# Patient Record
Sex: Female | Born: 1935 | Race: White | Hispanic: No | Marital: Married | Smoking: Never smoker
Health system: Southern US, Community
[De-identification: ages and names within clinical notes are randomized; demographics above are authoritative.]

## PROBLEM LIST (undated history)

## (undated) DIAGNOSIS — I4891 Unspecified atrial fibrillation: Secondary | ICD-10-CM

## (undated) DIAGNOSIS — I639 Cerebral infarction, unspecified: Secondary | ICD-10-CM

## (undated) DIAGNOSIS — F329 Major depressive disorder, single episode, unspecified: Secondary | ICD-10-CM

## (undated) DIAGNOSIS — R1310 Dysphagia, unspecified: Secondary | ICD-10-CM

## (undated) DIAGNOSIS — M353 Polymyalgia rheumatica: Secondary | ICD-10-CM

## (undated) DIAGNOSIS — F32A Depression, unspecified: Secondary | ICD-10-CM

## (undated) DIAGNOSIS — E785 Hyperlipidemia, unspecified: Secondary | ICD-10-CM

## (undated) DIAGNOSIS — I2510 Atherosclerotic heart disease of native coronary artery without angina pectoris: Secondary | ICD-10-CM

## (undated) DIAGNOSIS — I10 Essential (primary) hypertension: Secondary | ICD-10-CM

## (undated) HISTORY — PX: TOTAL ABDOMINAL HYSTERECTOMY: SHX209

## (undated) HISTORY — DX: Dysphagia, unspecified: R13.10

## (undated) HISTORY — DX: Essential (primary) hypertension: I10

## (undated) HISTORY — DX: Unspecified atrial fibrillation: I48.91

## (undated) HISTORY — DX: Hyperlipidemia, unspecified: E78.5

## (undated) HISTORY — PX: TOE SURGERY: SHX1073

## (undated) HISTORY — DX: Atherosclerotic heart disease of native coronary artery without angina pectoris: I25.10

## (undated) HISTORY — DX: Polymyalgia rheumatica: M35.3

## (undated) HISTORY — PX: OTHER SURGICAL HISTORY: SHX169

## (undated) HISTORY — PX: APPENDECTOMY: SHX54

## (undated) HISTORY — DX: Cerebral infarction, unspecified: I63.9

---

## 2003-06-03 ENCOUNTER — Inpatient Hospital Stay (HOSPITAL_COMMUNITY): Admission: AD | Admit: 2003-06-03 | Discharge: 2003-06-07 | Payer: Self-pay | Admitting: Cardiology

## 2004-06-19 ENCOUNTER — Ambulatory Visit: Payer: Self-pay | Admitting: Cardiology

## 2004-06-26 ENCOUNTER — Ambulatory Visit: Payer: Self-pay | Admitting: Cardiology

## 2004-06-27 ENCOUNTER — Ambulatory Visit: Payer: Self-pay | Admitting: Cardiology

## 2004-07-16 ENCOUNTER — Ambulatory Visit: Payer: Self-pay | Admitting: Cardiology

## 2004-07-24 ENCOUNTER — Ambulatory Visit: Payer: Self-pay | Admitting: Cardiology

## 2004-08-21 ENCOUNTER — Ambulatory Visit: Payer: Self-pay | Admitting: Cardiology

## 2004-09-18 ENCOUNTER — Ambulatory Visit: Payer: Self-pay | Admitting: Cardiology

## 2004-10-18 ENCOUNTER — Ambulatory Visit: Payer: Self-pay | Admitting: Cardiology

## 2004-11-15 ENCOUNTER — Ambulatory Visit: Payer: Self-pay | Admitting: Cardiology

## 2004-12-13 ENCOUNTER — Ambulatory Visit: Payer: Self-pay | Admitting: Cardiology

## 2004-12-27 ENCOUNTER — Ambulatory Visit: Payer: Self-pay | Admitting: Cardiology

## 2005-01-25 ENCOUNTER — Ambulatory Visit: Payer: Self-pay | Admitting: Cardiology

## 2005-02-25 ENCOUNTER — Ambulatory Visit: Payer: Self-pay | Admitting: Cardiology

## 2005-03-28 ENCOUNTER — Ambulatory Visit: Payer: Self-pay | Admitting: Cardiology

## 2005-04-25 ENCOUNTER — Ambulatory Visit: Payer: Self-pay | Admitting: Cardiology

## 2005-05-03 ENCOUNTER — Ambulatory Visit: Payer: Self-pay | Admitting: Cardiology

## 2005-06-03 ENCOUNTER — Ambulatory Visit: Payer: Self-pay | Admitting: Cardiology

## 2005-06-10 ENCOUNTER — Ambulatory Visit: Payer: Self-pay | Admitting: Cardiology

## 2005-07-08 ENCOUNTER — Ambulatory Visit: Payer: Self-pay | Admitting: Cardiology

## 2005-08-05 ENCOUNTER — Ambulatory Visit: Payer: Self-pay | Admitting: Cardiology

## 2005-08-15 ENCOUNTER — Ambulatory Visit: Payer: Self-pay | Admitting: Cardiology

## 2005-08-23 ENCOUNTER — Ambulatory Visit: Payer: Self-pay | Admitting: Cardiology

## 2005-09-18 ENCOUNTER — Ambulatory Visit: Payer: Self-pay | Admitting: Cardiology

## 2005-09-25 ENCOUNTER — Ambulatory Visit: Payer: Self-pay | Admitting: Cardiology

## 2005-10-21 ENCOUNTER — Ambulatory Visit: Payer: Self-pay | Admitting: Cardiology

## 2005-10-28 ENCOUNTER — Ambulatory Visit: Payer: Self-pay | Admitting: Cardiology

## 2005-11-25 ENCOUNTER — Ambulatory Visit: Payer: Self-pay | Admitting: Cardiology

## 2005-12-23 ENCOUNTER — Ambulatory Visit: Payer: Self-pay | Admitting: Cardiology

## 2005-12-30 ENCOUNTER — Ambulatory Visit: Payer: Self-pay | Admitting: Cardiology

## 2006-01-20 ENCOUNTER — Ambulatory Visit: Payer: Self-pay | Admitting: Cardiology

## 2006-02-05 ENCOUNTER — Ambulatory Visit: Payer: Self-pay | Admitting: Cardiology

## 2006-02-11 ENCOUNTER — Ambulatory Visit: Payer: Self-pay | Admitting: Cardiology

## 2006-03-06 ENCOUNTER — Ambulatory Visit: Payer: Self-pay | Admitting: Cardiology

## 2006-04-04 ENCOUNTER — Ambulatory Visit: Payer: Self-pay | Admitting: Cardiology

## 2006-04-18 ENCOUNTER — Ambulatory Visit: Payer: Self-pay | Admitting: Cardiology

## 2006-05-14 ENCOUNTER — Ambulatory Visit: Payer: Self-pay | Admitting: Cardiology

## 2006-05-27 ENCOUNTER — Ambulatory Visit: Payer: Self-pay | Admitting: Cardiology

## 2006-06-17 ENCOUNTER — Ambulatory Visit: Payer: Self-pay | Admitting: Cardiology

## 2006-06-27 ENCOUNTER — Ambulatory Visit: Payer: Self-pay | Admitting: Cardiology

## 2006-07-15 ENCOUNTER — Ambulatory Visit: Payer: Self-pay | Admitting: Cardiology

## 2006-07-22 ENCOUNTER — Ambulatory Visit: Payer: Self-pay | Admitting: Cardiology

## 2006-07-24 ENCOUNTER — Ambulatory Visit: Payer: Self-pay | Admitting: Endocrinology

## 2006-08-05 ENCOUNTER — Ambulatory Visit: Payer: Self-pay | Admitting: Cardiology

## 2006-08-19 HISTORY — PX: COLONOSCOPY: SHX174

## 2006-08-21 ENCOUNTER — Ambulatory Visit: Payer: Self-pay | Admitting: Cardiology

## 2006-09-11 ENCOUNTER — Ambulatory Visit: Payer: Self-pay | Admitting: Cardiology

## 2006-09-26 ENCOUNTER — Ambulatory Visit: Payer: Self-pay | Admitting: Cardiology

## 2006-10-20 ENCOUNTER — Ambulatory Visit: Payer: Self-pay | Admitting: Cardiology

## 2006-11-10 ENCOUNTER — Ambulatory Visit: Payer: Self-pay | Admitting: Cardiology

## 2006-12-16 ENCOUNTER — Ambulatory Visit: Payer: Self-pay | Admitting: Cardiology

## 2007-01-13 ENCOUNTER — Ambulatory Visit: Payer: Self-pay | Admitting: Cardiology

## 2007-02-09 ENCOUNTER — Ambulatory Visit: Payer: Self-pay | Admitting: Physician Assistant

## 2007-03-11 ENCOUNTER — Ambulatory Visit: Payer: Self-pay | Admitting: Cardiology

## 2007-04-08 ENCOUNTER — Ambulatory Visit: Payer: Self-pay | Admitting: Cardiology

## 2007-05-11 ENCOUNTER — Encounter: Payer: Self-pay | Admitting: *Deleted

## 2007-05-11 DIAGNOSIS — I6350 Cerebral infarction due to unspecified occlusion or stenosis of unspecified cerebral artery: Secondary | ICD-10-CM | POA: Insufficient documentation

## 2007-05-11 DIAGNOSIS — I482 Chronic atrial fibrillation, unspecified: Secondary | ICD-10-CM | POA: Insufficient documentation

## 2007-05-11 DIAGNOSIS — M353 Polymyalgia rheumatica: Secondary | ICD-10-CM | POA: Insufficient documentation

## 2007-05-11 DIAGNOSIS — E785 Hyperlipidemia, unspecified: Secondary | ICD-10-CM | POA: Insufficient documentation

## 2007-05-11 DIAGNOSIS — I10 Essential (primary) hypertension: Secondary | ICD-10-CM | POA: Insufficient documentation

## 2007-05-14 ENCOUNTER — Ambulatory Visit: Payer: Self-pay | Admitting: Cardiology

## 2007-06-11 ENCOUNTER — Ambulatory Visit: Payer: Self-pay | Admitting: Cardiology

## 2007-06-19 ENCOUNTER — Ambulatory Visit: Payer: Self-pay | Admitting: Cardiology

## 2007-07-09 ENCOUNTER — Ambulatory Visit: Payer: Self-pay | Admitting: Cardiology

## 2007-07-23 ENCOUNTER — Ambulatory Visit: Payer: Self-pay | Admitting: Cardiology

## 2007-08-21 ENCOUNTER — Ambulatory Visit: Payer: Self-pay | Admitting: Cardiology

## 2007-09-02 ENCOUNTER — Ambulatory Visit: Payer: Self-pay | Admitting: Cardiology

## 2007-09-14 ENCOUNTER — Ambulatory Visit: Payer: Self-pay | Admitting: Cardiology

## 2007-09-28 ENCOUNTER — Ambulatory Visit: Payer: Self-pay | Admitting: Cardiology

## 2007-10-12 ENCOUNTER — Ambulatory Visit: Payer: Self-pay | Admitting: Cardiology

## 2007-10-27 ENCOUNTER — Ambulatory Visit: Payer: Self-pay | Admitting: Cardiology

## 2007-11-17 ENCOUNTER — Ambulatory Visit: Payer: Self-pay | Admitting: Cardiology

## 2007-12-14 ENCOUNTER — Ambulatory Visit: Payer: Self-pay | Admitting: Cardiology

## 2007-12-28 ENCOUNTER — Ambulatory Visit: Payer: Self-pay | Admitting: Cardiology

## 2008-01-12 ENCOUNTER — Ambulatory Visit: Payer: Self-pay | Admitting: Cardiology

## 2008-01-29 ENCOUNTER — Ambulatory Visit: Payer: Self-pay | Admitting: Cardiology

## 2008-03-04 ENCOUNTER — Ambulatory Visit: Payer: Self-pay | Admitting: Cardiology

## 2008-04-01 ENCOUNTER — Ambulatory Visit: Payer: Self-pay | Admitting: Cardiology

## 2008-04-29 ENCOUNTER — Ambulatory Visit: Payer: Self-pay | Admitting: Cardiology

## 2008-05-27 ENCOUNTER — Ambulatory Visit: Payer: Self-pay | Admitting: Cardiology

## 2008-06-17 ENCOUNTER — Ambulatory Visit: Payer: Self-pay | Admitting: Cardiology

## 2008-07-19 ENCOUNTER — Ambulatory Visit: Payer: Self-pay | Admitting: Cardiology

## 2008-08-16 ENCOUNTER — Ambulatory Visit: Payer: Self-pay | Admitting: Cardiology

## 2008-09-02 ENCOUNTER — Ambulatory Visit: Payer: Self-pay | Admitting: Cardiology

## 2008-09-13 ENCOUNTER — Ambulatory Visit: Payer: Self-pay | Admitting: Cardiology

## 2008-10-11 ENCOUNTER — Ambulatory Visit: Payer: Self-pay | Admitting: Cardiology

## 2008-11-11 ENCOUNTER — Ambulatory Visit: Payer: Self-pay | Admitting: Cardiology

## 2008-11-22 ENCOUNTER — Ambulatory Visit: Payer: Self-pay | Admitting: Cardiology

## 2008-11-25 ENCOUNTER — Ambulatory Visit: Payer: Self-pay | Admitting: Cardiology

## 2008-12-09 ENCOUNTER — Ambulatory Visit: Payer: Self-pay | Admitting: Cardiology

## 2008-12-21 ENCOUNTER — Encounter: Admission: RE | Admit: 2008-12-21 | Discharge: 2008-12-21 | Payer: Self-pay | Admitting: *Deleted

## 2009-01-03 ENCOUNTER — Ambulatory Visit: Payer: Self-pay | Admitting: Cardiology

## 2009-01-19 ENCOUNTER — Encounter (INDEPENDENT_AMBULATORY_CARE_PROVIDER_SITE_OTHER): Payer: Self-pay | Admitting: *Deleted

## 2009-01-19 ENCOUNTER — Ambulatory Visit (HOSPITAL_COMMUNITY): Admission: RE | Admit: 2009-01-19 | Discharge: 2009-01-19 | Payer: Self-pay | Admitting: *Deleted

## 2009-01-31 ENCOUNTER — Ambulatory Visit: Payer: Self-pay | Admitting: Cardiology

## 2009-02-21 ENCOUNTER — Ambulatory Visit: Payer: Self-pay | Admitting: Cardiology

## 2009-03-07 ENCOUNTER — Ambulatory Visit: Payer: Self-pay

## 2009-04-03 ENCOUNTER — Encounter: Payer: Self-pay | Admitting: *Deleted

## 2009-04-04 ENCOUNTER — Ambulatory Visit: Payer: Self-pay | Admitting: Cardiology

## 2009-04-04 LAB — CONVERTED CEMR LAB: POC INR: 3.3

## 2009-04-18 ENCOUNTER — Ambulatory Visit: Payer: Self-pay | Admitting: Cardiology

## 2009-05-02 ENCOUNTER — Ambulatory Visit: Payer: Self-pay | Admitting: Cardiology

## 2009-05-02 LAB — CONVERTED CEMR LAB: POC INR: 3.1

## 2009-05-11 ENCOUNTER — Encounter: Payer: Self-pay | Admitting: Cardiology

## 2009-05-12 ENCOUNTER — Ambulatory Visit: Payer: Self-pay | Admitting: Cardiology

## 2009-05-12 LAB — CONVERTED CEMR LAB: POC INR: 3.1

## 2009-06-02 ENCOUNTER — Ambulatory Visit: Payer: Self-pay | Admitting: Cardiology

## 2009-07-04 ENCOUNTER — Ambulatory Visit: Payer: Self-pay | Admitting: Cardiology

## 2009-07-31 ENCOUNTER — Ambulatory Visit: Payer: Self-pay | Admitting: Cardiology

## 2009-07-31 DIAGNOSIS — R0989 Other specified symptoms and signs involving the circulatory and respiratory systems: Secondary | ICD-10-CM | POA: Insufficient documentation

## 2009-08-01 ENCOUNTER — Ambulatory Visit: Payer: Self-pay | Admitting: Cardiology

## 2009-08-03 ENCOUNTER — Encounter: Payer: Self-pay | Admitting: Cardiology

## 2009-09-08 ENCOUNTER — Ambulatory Visit: Payer: Self-pay | Admitting: Cardiology

## 2009-09-22 ENCOUNTER — Ambulatory Visit: Payer: Self-pay | Admitting: Cardiology

## 2009-10-10 ENCOUNTER — Ambulatory Visit: Payer: Self-pay | Admitting: Cardiology

## 2009-11-03 ENCOUNTER — Ambulatory Visit: Payer: Self-pay | Admitting: Cardiology

## 2009-11-03 DIAGNOSIS — R05 Cough: Secondary | ICD-10-CM

## 2009-11-03 DIAGNOSIS — R059 Cough, unspecified: Secondary | ICD-10-CM | POA: Insufficient documentation

## 2009-11-03 LAB — CONVERTED CEMR LAB: POC INR: 2.2

## 2009-12-01 ENCOUNTER — Ambulatory Visit: Payer: Self-pay | Admitting: Cardiology

## 2009-12-01 LAB — CONVERTED CEMR LAB: POC INR: 1.9

## 2009-12-29 ENCOUNTER — Ambulatory Visit: Payer: Self-pay | Admitting: Cardiology

## 2010-01-30 ENCOUNTER — Ambulatory Visit: Payer: Self-pay | Admitting: Cardiology

## 2010-01-30 LAB — CONVERTED CEMR LAB: POC INR: 2.8

## 2010-02-08 ENCOUNTER — Telehealth (INDEPENDENT_AMBULATORY_CARE_PROVIDER_SITE_OTHER): Payer: Self-pay | Admitting: *Deleted

## 2010-02-09 ENCOUNTER — Ambulatory Visit: Payer: Self-pay | Admitting: Cardiology

## 2010-02-12 ENCOUNTER — Telehealth (INDEPENDENT_AMBULATORY_CARE_PROVIDER_SITE_OTHER): Payer: Self-pay | Admitting: *Deleted

## 2010-03-13 ENCOUNTER — Ambulatory Visit: Payer: Self-pay | Admitting: Cardiology

## 2010-04-13 ENCOUNTER — Ambulatory Visit: Payer: Self-pay | Admitting: Cardiology

## 2010-04-20 ENCOUNTER — Encounter: Payer: Self-pay | Admitting: *Deleted

## 2010-04-20 ENCOUNTER — Telehealth (INDEPENDENT_AMBULATORY_CARE_PROVIDER_SITE_OTHER): Payer: Self-pay | Admitting: *Deleted

## 2010-04-24 ENCOUNTER — Ambulatory Visit: Payer: Self-pay | Admitting: Cardiology

## 2010-04-24 LAB — CONVERTED CEMR LAB: POC INR: 3.8

## 2010-05-08 ENCOUNTER — Ambulatory Visit: Payer: Self-pay | Admitting: Cardiology

## 2010-05-22 ENCOUNTER — Ambulatory Visit: Payer: Self-pay | Admitting: Cardiology

## 2010-05-22 LAB — CONVERTED CEMR LAB: POC INR: 3.3

## 2010-06-19 ENCOUNTER — Ambulatory Visit: Payer: Self-pay | Admitting: Cardiology

## 2010-06-19 LAB — CONVERTED CEMR LAB: POC INR: 3.3

## 2010-07-20 ENCOUNTER — Ambulatory Visit: Payer: Self-pay | Admitting: Cardiology

## 2010-07-20 LAB — CONVERTED CEMR LAB: POC INR: 2.4

## 2010-08-17 ENCOUNTER — Ambulatory Visit: Payer: Self-pay | Admitting: Cardiology

## 2010-09-14 ENCOUNTER — Ambulatory Visit: Admission: RE | Admit: 2010-09-14 | Discharge: 2010-09-14 | Payer: Self-pay | Source: Home / Self Care

## 2010-09-14 LAB — CONVERTED CEMR LAB: POC INR: 2.6

## 2010-09-16 LAB — CONVERTED CEMR LAB: POC INR: 2.9

## 2010-09-18 NOTE — Medication Information (Signed)
Summary: ccr-lr  Anticoagulant Therapy  Managed by: Vashti Hey, RN PCP: Billie Lade MD: Myrtis Ser MD, Tinnie Gens Indication 1: Atrial Fibrillation (ICD-427.31) Lab Used: [facility] [place] Site: [facility] of Care Clinic INR POC 2.2  Dietary changes: no    Health status changes: no    Bleeding/hemorrhagic complications: no    Recent/future hospitalizations: no    Any changes in medication regimen? no    Recent/future dental: no  Any missed doses?: no       Is patient compliant with meds? yes       Allergies: 1)  ! Sulfa  Anticoagulation Management History:      The patient is taking warfarin and comes in today for a routine follow up visit.  Positive risk factors for bleeding include an age of 75 years or older and history of CVA/TIA.  The bleeding index is 'intermediate risk'.  Positive CHADS2 values include History of HTN, Age > 75 years old, and Prior Stroke/CVA/TIA.  The start date was 06/10/2003.  Anticoagulation responsible provider: Myrtis Ser MD, Tinnie Gens.  INR POC: 2.2.  Cuvette Lot#: [id number].  Exp: 10/11.    Anticoagulation Management Assessment/Plan:      The patient's current anticoagulation dose is Warfarin sodium 4 mg  tabs: take 1 by mouth qd.  The target INR is 2 - 3.  The next INR is due 01/30/2010.  Anticoagulation instructions were given to patient.  Results were reviewed/authorized by Vashti Hey, RN.  She was notified by Vashti Hey RN.         Prior Anticoagulation Instructions: INR 1.9 Take coumadin 1 1/2 tablets tonight then increase dose to 1 tablet once daily   Current Anticoagulation Instructions: INR 2.2 Continue coumadin 4mg  once daily

## 2010-09-18 NOTE — Medication Information (Signed)
Summary: ccr  Anticoagulant Therapy  Managed by: Weston Brass, PharmD PCP: Billie Lade MD: Andee Lineman MD, Michelle Piper Indication 1: Atrial Fibrillation (ICD-427.31) Lab Used: [facility] [place] Site: [facility] of Care Clinic INR POC 2.9 INR RANGE 2.0-3.0  Dietary changes: no    Health status changes: no    Bleeding/hemorrhagic complications: yes       Details: had large hematoma with large knot on L arm 2 weeks ago.  Went to ER at [facility] and INR was 3.7.  They attributed to increased INR.  Knot resolved after a week or two.  Yesterday noticed same type of knot on arm with large bruise on R arm.  Area is raised and tender but not feverish.  pt doesn't remember getting bit or hitting her arm.  Contacted PCP's office but MD is out of town until after next week.  Dr. Jens Som evaluated pt.  Pt was instructed to continue watching and contact MD if worsens.  Will check CBC today to ensure platelets are normal.  Will try to keep INR on lower end of scale.    Recent/future hospitalizations: no    Any changes in medication regimen? no    Recent/future dental: no  Any missed doses?: no       Is patient compliant with meds? yes       Allergies: 1)  ! Sulfa  Anticoagulation Management History:      The patient is taking warfarin and comes in today for a routine follow up visit.  Positive risk factors for bleeding include an age of 75 years or older and history of CVA/TIA.  The bleeding index is 'intermediate risk'.  Positive CHADS2 values include History of HTN, Age > 55 years old, and Prior Stroke/CVA/TIA.  The start date was 06/10/2003.  Anticoagulation responsible provider: Andee Lineman MD, Michelle Piper.  INR POC: 2.9.  Cuvette Lot#: [id number].  Exp: 03/2011.    Anticoagulation Management Assessment/Plan:      The patient's current anticoagulation dose is Warfarin sodium 4 mg  tabs: take 1 by mouth qd.  The target INR is 2 - 3.  The next INR is due 03/06/2010.  Anticoagulation instructions were  given to patient.  Results were reviewed/authorized by Weston Brass, PharmD.  She was notified by Weston Brass PharmD.         Prior Anticoagulation Instructions: INR 2.8 Continue coumadin 4mg  once daily   Current Anticoagulation Instructions: INR 2.9  Take 1/2 tablet today then resume same dose of 1 tablet every day.

## 2010-09-18 NOTE — Medication Information (Signed)
Summary: ccr-lr  Anticoagulant Therapy  Managed by: Vashti Hey, RN PCP: Billie Lade MD: Andee Lineman MD, Michelle Piper Indication 1: Atrial Fibrillation (ICD-427.31) Lab Used: [facility] [place] Site: [facility] of Care Clinic INR POC 2.5  Dietary changes: no    Health status changes: no    Bleeding/hemorrhagic complications: no    Recent/future hospitalizations: no    Any changes in medication regimen? no    Recent/future dental: no  Any missed doses?: no       Is patient compliant with meds? yes       Allergies: 1)  ! Sulfa  Anticoagulation Management History:      The patient is taking warfarin and comes in today for a routine follow up visit.  Positive risk factors for bleeding include an age of 75 years or older and history of CVA/TIA.  The bleeding index is 'intermediate risk'.  Positive CHADS2 values include History of HTN, Age > 75 years old, and Prior Stroke/CVA/TIA.  The start date was 06/10/2003.  Anticoagulation responsible provider: Andee Lineman MD, Michelle Piper.  INR POC: 2.5.  Cuvette Lot#: [id number].  Exp: 10/11.    Anticoagulation Management Assessment/Plan:      The patient's current anticoagulation dose is Warfarin sodium 4 mg  tabs: take 1 by mouth qd.  The target INR is 2 - 3.  The next INR is due 11/03/2009.  Anticoagulation instructions were given to patient.  Results were reviewed/authorized by Vashti Hey, RN.  She was notified by Vashti Hey RN.         Prior Anticoagulation Instructions: INR 2.1 Increase coumadin to 4mg  once daily except 2mg  on Mondays  Current Anticoagulation Instructions: INR 2.5 Continue coumadin 4mg  once daily except 2mg  on Mondays

## 2010-09-18 NOTE — Medication Information (Signed)
Summary: ccr-lr  Anticoagulant Therapy  Managed by: Vashti Hey, RN PCP: Billie Lade MD: Myrtis Ser MD, Tinnie Gens Indication 1: Atrial Fibrillation (ICD-427.31) Lab Used: [facility] [place] Site: [facility] of Care Clinic INR POC 2.2  Dietary changes: no    Health status changes: no    Bleeding/hemorrhagic complications: no    Recent/future hospitalizations: no    Any changes in medication regimen? no    Recent/future dental: no  Any missed doses?: no       Is patient compliant with meds? yes       Allergies: 1)  ! Sulfa  Anticoagulation Management History:      The patient is taking warfarin and comes in today for a routine follow up visit.  Positive risk factors for bleeding include an age of 75 years or older and history of CVA/TIA.  The bleeding index is 'intermediate risk'.  Positive CHADS2 values include History of HTN, Age > 76 years old, and Prior Stroke/CVA/TIA.  The start date was 06/10/2003.  Anticoagulation responsible provider: Myrtis Ser MD, Tinnie Gens.  INR POC: 2.2.  Cuvette Lot#: [id number].  Exp: 10/11.    Anticoagulation Management Assessment/Plan:      The patient's current anticoagulation dose is Warfarin sodium 4 mg  tabs: take 1 by mouth qd.  The target INR is 2 - 3.  The next INR is due 12/01/2009.  Anticoagulation instructions were given to patient.  Results were reviewed/authorized by Vashti Hey, RN.  She was notified by Vashti Hey RN.         Prior Anticoagulation Instructions: INR 2.5 Continue coumadin 4mg  once daily except 2mg  on Mondays  Current Anticoagulation Instructions: INR 2.2 Continue coumadin 4mg  once daily except 2mg  on Mondays

## 2010-09-18 NOTE — Progress Notes (Signed)
Summary: Pt wants lab results   Phone Note Call from Patient Call back at [facility] Phone [phone]   Summary of Call: Pt called for results of CBC. Pt notified results have not been reviewed by physician. Pt aware she will be notified of results once reviewed and signed by MD. Pt states she is going out of town tomorrow and would like the results before they go out of town. Initial call taken by: Cyril Loosen, RN, BSN,  February 12, 2010 10:06 AM     Appended Document: Pt wants lab results Spoke with pt.  She is aware of results.  Was checking to ensure platelets were okay given her recent episodes of increased bruising/hematoma.  Has appt with PCP next week.

## 2010-09-18 NOTE — Medication Information (Signed)
Summary: ccr-on abx-lr  Anticoagulant Therapy  Managed by: Vashti Hey, RN PCP: Billie Lade MD: Antoine Poche MD, Fayrene Fearing Indication 1: Atrial Fibrillation (ICD-427.31) Lab Used: [facility]  Site: [facility] of Care Clinic INR POC 3.8 INR RANGE 2.0-3.0  Dietary changes: no    Health status changes: no    Bleeding/hemorrhagic complications: no    Recent/future hospitalizations: no    Any changes in medication regimen? yes       Details: on 2 abx for diverticulitis  Recent/future dental: no  Any missed doses?: no       Is patient compliant with meds? yes       Allergies: 1)  ! Sulfa  Anticoagulation Management History:      The patient is taking warfarin and comes in today for a routine follow up visit.  Positive risk factors for bleeding include an age of 75 years or older and history of CVA/TIA.  The bleeding index is 'intermediate risk'.  Positive CHADS2 values include History of HTN, Age > 30 years old, and Prior Stroke/CVA/TIA.  The start date was 06/10/2003.  Anticoagulation responsible provider: Antoine Poche MD, Fayrene Fearing.  INR POC: 3.8.  Cuvette Lot#: [id number].  Exp: 03/2011.    Anticoagulation Management Assessment/Plan:      The patient's current anticoagulation dose is Warfarin sodium 4 mg  tabs: take 1 by mouth qd.  The target INR is 2 - 3.  The next INR is due 05/08/2010.  Anticoagulation instructions were given to patient.  Results were reviewed/authorized by Vashti Hey, RN.  She was notified by Vashti Hey RN.         Prior Anticoagulation Instructions: INR 2.3 Continue coumadin 4mg  once daily   Current Anticoagulation Instructions: INR 3.8 Hold coumadin tonight then decrease dose to 5mg  once daily except 2.5mg  on Tuesday and Saturday till finished with abx then resume 5mg  once daily

## 2010-09-18 NOTE — Medication Information (Signed)
Summary: ccr-lr  Anticoagulant Therapy  Managed by: Vashti Hey, RN PCP: Billie Lade MD: Diona Browner MD, Remi Deter Indication 1: Atrial Fibrillation (ICD-427.31) Lab Used: [facility] [place] Site: [facility] of Care Clinic INR POC 2.9 INR RANGE 2.0-3.0  Dietary changes: no    Health status changes: no    Bleeding/hemorrhagic complications: no    Recent/future hospitalizations: no    Any changes in medication regimen? no    Recent/future dental: no  Any missed doses?: no       Is patient compliant with meds? yes       Allergies: 1)  ! Sulfa  Anticoagulation Management History:      The patient is taking warfarin and comes in today for a routine follow up visit.  Positive risk factors for bleeding include an age of 75 years or older and history of CVA/TIA.  The bleeding index is 'intermediate risk'.  Positive CHADS2 values include History of HTN, Age > 75 years old, and Prior Stroke/CVA/TIA.  The start date was 06/10/2003.  Anticoagulation responsible provider: Diona Browner MD, Remi Deter.  INR POC: 2.9.  Cuvette Lot#: [id number].  Exp: 03/2011.    Anticoagulation Management Assessment/Plan:      The patient's current anticoagulation dose is Warfarin sodium 4 mg  tabs: take 1 by mouth qd.  The target INR is 2 - 3.  The next INR is due 04/13/2010.  Anticoagulation instructions were given to patient.  Results were reviewed/authorized by Vashti Hey, RN.  She was notified by Vashti Hey RN.         Prior Anticoagulation Instructions: INR 2.9  Take 1/2 tablet today then resume same dose of 1 tablet every day.    Current Anticoagulation Instructions: INR 2.9 Continue coumadin 4mg  once daily

## 2010-09-18 NOTE — Medication Information (Signed)
Summary: ccr-lr  Anticoagulant Therapy  Managed by: Vashti Hey, RN PCP: Billie Lade MD: Andee Lineman MD, Michelle Piper Indication 1: Atrial Fibrillation (ICD-427.31) Lab [facility]: [facility]  Site: [facility] of Care Clinic INR POC 2.8  Dietary changes: no    Health status changes: no    Bleeding/hemorrhagic complications: yes       Details: increased bruising  Recent/future hospitalizations: no    Any changes in medication regimen? no    Recent/future dental: no  Any missed doses?: yes     Details: was off 3 days 2 weeks ago for INR of 3.7 at ED  Is patient compliant with meds? yes       Allergies: 1)  ! Sulfa  Anticoagulation Management History:      The patient is taking warfarin and comes in today for a routine follow up visit.  Positive risk factors for bleeding include an age of 75 years or older and history of CVA/TIA.  The bleeding index is 'intermediate risk'.  Positive CHADS2 values include History of HTN, Age > 75 years old, and Prior Stroke/CVA/TIA.  The start date was 06/10/2003.  Anticoagulation responsible provider: Andee Lineman MD, Michelle Piper.  INR POC: 2.8.  Cuvette Lot#: [id number].  Exp: 10/11.    Anticoagulation Management Assessment/Plan:      The patient's current anticoagulation dose is Warfarin sodium 4 mg  tabs: take 1 by mouth qd.  The target INR is 2 - 3.  The next INR is due 02/13/2010.  Anticoagulation instructions were given to patient.  Results were reviewed/authorized by Vashti Hey, RN.  She was notified by Vashti Hey RN.         Prior Anticoagulation Instructions: INR 2.2 Continue coumadin 4mg  once daily   Current Anticoagulation Instructions: INR 2.8 Continue coumadin 4mg  once daily

## 2010-09-18 NOTE — Medication Information (Signed)
Summary: ccr-lr  Anticoagulant Therapy  Managed by: Vashti Hey, RN PCP: Billie Lade MD: Myrtis Ser MD, Tinnie Gens Indication 1: Atrial Fibrillation (ICD-427.31) Lab Used: [facility] [place] Site: [facility] of Care Clinic INR POC 2.3 INR RANGE 2.0-3.0  Dietary changes: no    Health status changes: no    Bleeding/hemorrhagic complications: no    Recent/future hospitalizations: no    Any changes in medication regimen? no    Recent/future dental: no  Any missed doses?: no       Is patient compliant with meds? yes       Allergies: 1)  ! Sulfa  Anticoagulation Management History:      The patient is taking warfarin and comes in today for a routine follow up visit.  Positive risk factors for bleeding include an age of 75 years or older and history of CVA/TIA.  The bleeding index is 'intermediate risk'.  Positive CHADS2 values include History of HTN, Age > 32 years old, and Prior Stroke/CVA/TIA.  The start date was 06/10/2003.  Anticoagulation responsible provider: Myrtis Ser MD, Tinnie Gens.  INR POC: 2.3.  Cuvette Lot#: [id number].  Exp: 03/2011.    Anticoagulation Management Assessment/Plan:      The patient's current anticoagulation dose is Warfarin sodium 4 mg  tabs: take 1 by mouth qd.  The target INR is 2 - 3.  The next INR is due 05/11/2010.  Anticoagulation instructions were given to patient.  Results were reviewed/authorized by Vashti Hey, RN.  She was notified by Vashti Hey RN.         Prior Anticoagulation Instructions: INR 2.9 Continue coumadin 4mg  once daily   Current Anticoagulation Instructions: INR 2.3 Continue coumadin 4mg  once daily

## 2010-09-18 NOTE — Medication Information (Signed)
Summary: ccr-lr  Anticoagulant Therapy  Managed by: Vashti Hey, RN PCP: Billie Lade MD: Andee Lineman MD, Michelle Piper Indication 1: Atrial Fibrillation (ICD-427.31) Lab Used: [facility] [place] Site: [facility] of Care Clinic INR POC 1.9  Dietary changes: yes       Details: had extra Vit K this week  Health status changes: no    Bleeding/hemorrhagic complications: no    Recent/future hospitalizations: no    Any changes in medication regimen? no    Recent/future dental: no  Any missed doses?: no       Is patient compliant with meds? yes       Allergies: 1)  ! Sulfa  Anticoagulation Management History:      The patient is taking warfarin and comes in today for a routine follow up visit.  Positive risk factors for bleeding include an age of 13 years or older and history of CVA/TIA.  The bleeding index is 'intermediate risk'.  Positive CHADS2 values include History of HTN, Age > 83 years old, and Prior Stroke/CVA/TIA.  The start date was 06/10/2003.  Anticoagulation responsible provider: Andee Lineman MD, Michelle Piper.  INR POC: 1.9.  Cuvette Lot#: [id number].  Exp: 10/11.    Anticoagulation Management Assessment/Plan:      The patient's current anticoagulation dose is Warfarin sodium 4 mg  tabs: take 1 by mouth qd.  The target INR is 2 - 3.  The next INR is due 12/29/2009.  Anticoagulation instructions were given to patient.  Results were reviewed/authorized by Vashti Hey, RN.  She was notified by Vashti Hey RN.         Prior Anticoagulation Instructions: INR 2.2 Continue coumadin 4mg  once daily except 2mg  on Mondays  Current Anticoagulation Instructions: INR 1.9 Take coumadin 1 1/2 tablets tonight then increase dose to 1 tablet once daily

## 2010-09-18 NOTE — Medication Information (Signed)
Summary: ccr-lr  Anticoagulant Therapy  Managed by: Vashti Hey, RN PCP: Billie Lade MD: Diona Browner MD, Remi Deter Indication 1: Atrial Fibrillation (ICD-427.31) Lab Used: [facility] [place] Site: [facility] of Care Clinic INR POC 2.4 INR RANGE 2.0-3.0  Dietary changes: no    Health status changes: no    Bleeding/hemorrhagic complications: no    Recent/future hospitalizations: no    Any changes in medication regimen? no    Recent/future dental: no  Any missed doses?: no       Is patient compliant with meds? yes       Allergies: 1)  ! Sulfa  Anticoagulation Management History:      The patient is taking warfarin and comes in today for a routine follow up visit.  Positive risk factors for bleeding include an age of 75 years or older and history of CVA/TIA.  The bleeding index is 'intermediate risk'.  Positive CHADS2 values include History of HTN, Age > 8 years old, and Prior Stroke/CVA/TIA.  The start date was 06/10/2003.  Anticoagulation responsible provider: Diona Browner MD, Remi Deter.  INR POC: 2.4.  Exp: 03/2011.    Anticoagulation Management Assessment/Plan:      The patient's current anticoagulation dose is Warfarin sodium 4 mg  tabs: take 1 by mouth qd.  The target INR is 2 - 3.  The next INR is due 08/17/2010.  Anticoagulation instructions were given to patient.  Results were reviewed/authorized by Vashti Hey, RN.  She was notified by Vashti Hey RN.         Prior Anticoagulation Instructions: INR 3.3 Decrease coumadin to 4mg  once daily except 2mg  on Tuesdays  Current Anticoagulation Instructions: INR 2.4 Continue coumadin 4mg  once daily except 2mg  on Tuesdays

## 2010-09-18 NOTE — Medication Information (Signed)
Summary: ccr-lr  Anticoagulant Therapy  Managed by: Vashti Hey, RN PCP: Billie Lade MD: Diona Browner MD, Remi Deter Indication 1: Atrial Fibrillation (ICD-427.31) Lab Used: [facility] [place] Site: [facility] of Care Clinic INR POC 2.1  Dietary changes: no    Health status changes: no    Bleeding/hemorrhagic complications: no    Recent/future hospitalizations: no    Any changes in medication regimen? no    Recent/future dental: no  Any missed doses?: no       Is patient compliant with meds? yes       Allergies: 1)  ! Sulfa  Anticoagulation Management History:      The patient is taking warfarin and comes in today for a routine follow up visit.  Positive risk factors for bleeding include an age of 75 years or older and history of CVA/TIA.  The bleeding index is 'intermediate risk'.  Positive CHADS2 values include History of HTN, Age > 40 years old, and Prior Stroke/CVA/TIA.  The start date was 06/10/2003.  Anticoagulation responsible provider: Diona Browner MD, Remi Deter.  INR POC: 2.1.  Cuvette Lot#: [id number].  Exp: 10/11.    Anticoagulation Management Assessment/Plan:      The patient's current anticoagulation dose is Warfarin sodium 4 mg  tabs: take 1 by mouth qd.  The target INR is 2 - 3.  The next INR is due 10/10/2009.  Anticoagulation instructions were given to patient.  Results were reviewed/authorized by Vashti Hey, RN.  She was notified by Vashti Hey RN.         Prior Anticoagulation Instructions: INR 1.8 Take coumadin 1 1/2 tablets tonight then resume 1 tablet once daily except 1/2 tablet on Mondays and Fridays  Current Anticoagulation Instructions: INR 2.1 Increase coumadin to 4mg  once daily except 2mg  on Mondays

## 2010-09-18 NOTE — Medication Information (Signed)
Summary: ccr-lr  Anticoagulant Therapy  Managed by: Vashti Hey, RN PCP: Billie Lade MD: Antoine Poche MD, Fayrene Fearing Indication 1: Atrial Fibrillation (ICD-427.31) Lab Used: [facility] [place] Site: [facility] of Care Clinic INR POC 1.8 INR RANGE 2.0-3.0  Dietary changes: no    Health status changes: no    Bleeding/hemorrhagic complications: no    Recent/future hospitalizations: no    Any changes in medication regimen? yes       Details: Been on Abx for facial surgery  Recent/future dental: no  Any missed doses?: no       Is patient compliant with meds? yes       Allergies: 1)  ! Sulfa  Anticoagulation Management History:      The patient is taking warfarin and comes in today for a routine follow up visit.  Positive risk factors for bleeding include an age of 75 years or older and history of CVA/TIA.  The bleeding index is 'intermediate risk'.  Positive CHADS2 values include History of HTN, Age > 81 years old, and Prior Stroke/CVA/TIA.  The start date was 06/10/2003.  Anticoagulation responsible Jolie Strohecker: Antoine Poche MD, Fayrene Fearing.  INR POC: 1.8.  Cuvette Lot#: [id number].  Exp: 03/2011.    Anticoagulation Management Assessment/Plan:      The patient's current anticoagulation dose is Warfarin sodium 4 mg  tabs: take 1 by mouth qd.  The target INR is 2 - 3.  The next INR is due 05/22/2010.  Anticoagulation instructions were given to patient.  Results were reviewed/authorized by Vashti Hey, RN.  She was notified by Vashti Hey RN.         Prior Anticoagulation Instructions: INR 3.8 Hold coumadin tonight then decrease dose to 5mg  once daily except 2.5mg  on Tuesday and Saturday till finished with abx then resume 5mg  once daily   Current Anticoagulation Instructions: INR 1.8 Take coumadin 6mg  tonight and tomorrow night then resume 4mg  once daily

## 2010-09-18 NOTE — Medication Information (Signed)
Summary: ccr-lr  Anticoagulant Therapy  Managed by: Vashti Hey, RN PCP: Billie Lade MD: Andee Lineman MD, Michelle Piper Indication 1: Atrial Fibrillation (ICD-427.31) Lab Used: [facility] [place] Site: [facility] of Care Clinic INR POC 3.3 INR RANGE 2.0-3.0  Dietary changes: no    Health status changes: no    Bleeding/hemorrhagic complications: no    Recent/future hospitalizations: no    Any changes in medication regimen? no    Recent/future dental: no  Any missed doses?: no       Is patient compliant with meds? yes       Allergies: 1)  ! Sulfa  Anticoagulation Management History:      The patient is taking warfarin and comes in today for a routine follow up visit.  Positive risk factors for bleeding include an age of 72 years or older and history of CVA/TIA.  The bleeding index is 'intermediate risk'.  Positive CHADS2 values include History of HTN, Age > 51 years old, and Prior Stroke/CVA/TIA.  The start date was 06/10/2003.  Anticoagulation responsible provider: Andee Lineman MD, Michelle Piper.  INR POC: 3.3.  Cuvette Lot#: [id number].  Exp: 03/2011.    Anticoagulation Management Assessment/Plan:      The patient's current anticoagulation dose is Warfarin sodium 4 mg  tabs: take 1 by mouth qd.  The target INR is 2 - 3.  The next INR is due 07/20/2010.  Anticoagulation instructions were given to patient.  Results were reviewed/authorized by Vashti Hey, RN.  She was notified by Vashti Hey RN.         Prior Anticoagulation Instructions: INR 3.3 Take coumadin 1/2 tablet tonight then resume 1 tablet once daily   Current Anticoagulation Instructions: INR 3.3 Decrease coumadin to 4mg  once daily except 2mg  on Tuesdays

## 2010-09-18 NOTE — Progress Notes (Signed)
Summary: RAISED AREA TO LOWER RIGHT ANTERIOR ARM   Phone Note Call from Patient Call back at Home Phone [phone]   Caller: Patient Call For: nurse Summary of Call: patient walked into office to be seen for her raised area to right anterior lower arm that was purple with red around the area. Patient informed nurse that she didn't bump or hit arm on anything.  Nurse instructed patient to go to ED for evaluation. Patient was reluctant to doing this saying she had the same problem 2 weeks ago and went to [facility] and they just checked her level, said it was too high and this was the cause of it. Nurse informed patient to come in am to see coumadin nurse and also informed patient if she get dizzy,sob,pain or area start to get worse,to proceed to Ed. Patient verbalized understanding of plan.  Initial call taken by: Carlye Grippe,  February 08, 2010 5:08 PM  Follow-up for Phone Call        Agree      Appended Document: RAISED AREA TO LOWER RIGHT ANTERIOR ARM Above agree per Dr. Andee Lineman.

## 2010-09-18 NOTE — Progress Notes (Signed)
Summary: MEDICATION QUESTION    Phone Note Call from Patient Call back at Home Phone [phone]   Caller: QUESTION Reason for Call: Talk to Nurse Details for Reason: ANTIBOTIC Summary of Call: PATIENT WAS PUT ON ANITBOTIC AND DR. DANIELS WOULD LIKED HER CHECKED SOONER  Initial call taken by: Claudette Laws,  April 20, 2010 9:39 AM  Follow-up for Phone Call        Was started on Abx for diverticulitis.  Does not know name of it yet.  Dr Reuel Boom wanted INR checked on 04/24/10.  Appt made and pt to bring bottle with her. Follow-up by: Vashti Hey RN,  April 20, 2010 9:52 AM      Anticoagulation Management History:      Positive risk factors for bleeding include an age of 75 years or older and history of CVA/TIA.  The bleeding index is 'intermediate risk'.  Positive CHADS2 values include History of HTN, Age > 75 years old, and Prior Stroke/CVA/TIA.  The start date was 06/10/2003.  Anticoagulation responsible provider: Myrtis Ser MD, Tinnie Gens.  Exp: 03/2011.    Anticoagulation Management Assessment/Plan:      The patient's current anticoagulation dose is Warfarin sodium 4 mg  tabs: take 1 by mouth qd.  The target INR is 2 - 3.  The next INR is due 05/11/2010.  Anticoagulation instructions were given to patient.  Results were reviewed/authorized by Vashti Hey, RN.         Prior Anticoagulation Instructions: INR 2.3 Continue coumadin 4mg  once daily

## 2010-09-18 NOTE — Medication Information (Signed)
Summary: ccr-lr r/s due to weather 1/18  Anticoagulant Therapy  Managed by: Vashti Hey, RN PCP: Billie Lade MD: Diona Browner MD, Remi Deter Indication 1: Atrial Fibrillation (ICD-427.31) Lab [facility] [place] Site: [facility] INR POC 1.8  Dietary changes: no    Health status changes: no    Bleeding/hemorrhagic complications: no    Recent/future hospitalizations: no    Any changes in medication regimen? no    Recent/future dental: no  Any missed doses?: no       Is patient compliant with meds? yes       Allergies: 1)  ! Sulfa  Anticoagulation Management History:      The patient is taking warfarin and comes in today for a routine follow up visit.  Positive risk factors for bleeding include an age of 75 years or older and history of CVA/TIA.  The bleeding index is 'intermediate risk'.  Positive CHADS2 values include History of HTN, Age > 75 years old, and Prior Stroke/CVA/TIA.  The start date was 06/10/2003.  Anticoagulation responsible provider: Diona Browner MD, Remi Deter.  INR POC: 1.8.  Cuvette Lot#: [id number].  Exp: 10/11.    Anticoagulation Management Assessment/Plan:      The patient's current anticoagulation dose is Warfarin sodium 4 mg  tabs: take 1 by mouth qd.  The target INR is 2 - 3.  The next INR is due 09/22/2009.  Anticoagulation instructions were given to patient.  Results were reviewed/authorized by Vashti Hey, RN.  She was notified by Vashti Hey RN.         Prior Anticoagulation Instructions: INR 2.3 Continue coumadin 4mg  once daily except 2mg  on Mondays and Fridays  Current Anticoagulation Instructions: INR 1.8 Take coumadin 1 1/2 tablets tonight then resume 1 tablet once daily except 1/2 tablet on Mondays and Fridays

## 2010-09-18 NOTE — Medication Information (Signed)
Summary: ccr-lr  Anticoagulant Therapy  Managed by: Vashti Hey, RN PCP: Billie Lade MD: Myrtis Ser MD, Tinnie Gens Indication 1: Atrial Fibrillation (ICD-427.31) Lab Used: [facility] [place] Site: [facility] INR POC 3.3 INR RANGE 2.0-3.0  Dietary changes: no    Health status changes: no    Bleeding/hemorrhagic complications: no    Recent/future hospitalizations: no    Any changes in medication regimen? no    Recent/future dental: no  Any missed doses?: no       Is patient compliant with meds? yes       Allergies: 1)  ! Sulfa  Anticoagulation Management History:      The patient is taking warfarin and comes in today for a routine follow up visit.  Positive risk factors for bleeding include an age of 75 years or older and history of CVA/TIA.  The bleeding index is 'intermediate risk'.  Positive CHADS2 values include History of HTN, Age > 75 years old, and Prior Stroke/CVA/TIA.  The start date was 06/10/2003.  Anticoagulation responsible Rani Idler: Myrtis Ser MD, Tinnie Gens.  INR POC: 3.3.  Cuvette Lot#: [id number].  Exp: 03/2011.    Anticoagulation Management Assessment/Plan:      The patient's current anticoagulation dose is Warfarin sodium 4 mg  tabs: take 1 by mouth qd.  The target INR is 2 - 3.  The next INR is due 06/19/2010.  Anticoagulation instructions were given to patient.  Results were reviewed/authorized by Vashti Hey, RN.  She was notified by Vashti Hey RN.         Prior Anticoagulation Instructions: INR 1.8 Take coumadin 6mg  tonight and tomorrow night then resume 4mg  once daily   Current Anticoagulation Instructions: INR 3.3 Take coumadin 1/2 tablet tonight then resume 1 tablet once daily

## 2010-09-18 NOTE — Assessment & Plan Note (Signed)
Summary: 3 MO FU PER MARCH REMINDER-SRS   Primary Provider:  Reuel Boom  CC:  follow-up visit.  History of Present Illness: the patient is a 77 year old female with a history of permanent atrial fibrillation.  She is status post CVA on Coumadin.  During her previous office visit her blood pressure was poorly controlled.  Hydrochlorothiazide was added to her medical regimen.  Of note also the patient was found to have a carotid bruit the right carotid artery and was referred for carotid Dopplers.  The patient reports no chest pain shortness of breath palpitations or syncope.  She does report a somewhat chronic cough possibly secondary to upper airway cough syndrome.  Carotid Doppler showed no significant carotid artery disease and the patient's blood pressure is now much better controlled with the addition of hydrochlorothiazide  Clinical Review Panels:  Cardiac Imaging Cardiac Cath Findings  1. Left main trunk:  Mild irregularities.  2. LAD is a medium caliber vessel that provides two diagonal branches.  The     LAD has mild disease of 20-30%.  3. Left circumflex artery is a small caliber vessel.  Provides two marginal     branches in the proximal segment.  The left circumflex system has mild     irregularities of 30%.  4. Right coronary artery is dominant.  This is a large caliber vessel.     Provides posterior descending artery and posterior ventricular branch in     terminal segment.  The right coronary artery has mild narrowing of 20-30%     in the mid section.  5. LV:  Normal end-systolic and end-diastolic dimensions.  Overall left     ventricular function is well preserved.  Ejection fraction greater than     55%.  No mitral regurgitation.  LV pressure is 140/5.  Aortic is 140/50.     LVEDP equals 20.    ASSESSMENT/PLAN:  [name] [name] is a 77 year old female with chest discomfort.  She has trivial coronary artery disease and well preserved left ventricular  function.  Continued  medical therapy and risk factor modification will be  pursued and other causes of chest pain investigated.                                             Veneda Melter, M.D. (06/06/2003)    Preventive Screening-Counseling & Management  Alcohol-Tobacco     Smoking Status: never  Current Medications (verified): 1)  Warfarin Sodium 4 Mg  Tabs (Warfarin Sodium) .... Take 1 By Mouth Qd 2)  Lipitor 20 Mg  Tabs (Atorvastatin Calcium) .... Take 1 By Mouth Qd 3)  Atenolol 50 Mg  Tabs (Atenolol) .... Take 1  Tab By Mouth Qd 4)  Omega-3 Cf 1000 Mg  Caps (Omega-3 Fatty Acids) .... Take 1 By Mouth Qd 5)  Multivitamins   Tabs (Multiple Vitamin) .... Take 1 By Mouth Qd 6)  Caltrate 600+d 600-400 Mg-Unit Tabs (Calcium Carbonate-Vitamin D) .... Take 1 Tablet By Mouth Once A Day 7)  Diltiazem Hcl Er Beads 240 Mg Xr24h-Cap (Diltiazem Hcl Er Beads) .... Take 1 Tablet By Mouth Once A Day 8)  Glucosamine-Chondroitin 1500-1200 Mg/51ml Liqd (Glucosamine-Chondroitin) .... Take 1 Tablet By Mouth Once A Day 9)  Prednisone 1 Mg Tabs (Prednisone) .... Take 4 Tablet By Mouth Once A Day 10)  Ascorbic Acid 500 Mg Tabs (Ascorbic Acid) .... Take 1  Tablet By Mouth Once A Day 11)  Lisinopril-Hydrochlorothiazide 20-25 Mg Tabs (Lisinopril-Hydrochlorothiazide) .... Take 1 Tablet By Mouth Once A Day  Allergies (verified): 1)  ! Sulfa  Comments:  Nurse/Medical Assistant: The patient's medications and allergies were reviewed with the patient and were updated in the Medication and Allergy Lists. Verbally gave names.   Past History:  Past Medical History: Last updated: 05/18/2009 POLYMYALGIA RHEUMATICA (ICD-725) CVA (ICD-434.91) HYPERTENSION (ICD-401.9) HYPERLIPIDEMIA (ICD-272.4) ATRIAL FIBRILLATION (ICD-427.31)  Past Surgical History: Last updated: 05/18/2009 Abdominal Hysterectomy-Total Appendectomy  Family History: Last updated: 05/18/2009 Family History of Coronary Artery Disease:   Social History: Last  updated: 05/18/2009 Retired  Married  Tobacco Use - No.  Alcohol Use - no  Risk Factors: Smoking Status: never (11/03/2009)  Review of Systems  The patient denies fatigue, malaise, fever, weight gain/loss, vision loss, decreased hearing, hoarseness, chest pain, palpitations, shortness of breath, prolonged cough, wheezing, sleep apnea, coughing up blood, abdominal pain, blood in stool, nausea, vomiting, diarrhea, heartburn, incontinence, blood in urine, muscle weakness, joint pain, leg swelling, rash, skin lesions, headache, fainting, dizziness, depression, anxiety, enlarged lymph nodes, easy bruising or bleeding, and environmental allergies.    Vital Signs:  Patient profile:   77 year old female Height:      66 inches Weight:      166 pounds Pulse rate:   80 / minute BP sitting:   124 / 71  (left arm) Cuff size:   regular  Vitals Entered By: Carlye Grippe (November 03, 2009 9:53 AM) CC: follow-up visit   Physical Exam  Additional Exam:  General: Well-developed, well-nourished in no distress head: Normocephalic and atraumatic eyes PERRLA/EOMI intact, conjunctiva and lids normal nose: No deformity or lesions mouth normal dentition, normal posterior pharynx neck: Supple, no JVD.  No masses, thyromegaly or abnormal cervical nodes.  Right carotid bruit lungs: Normal breath sounds bilaterally without wheezing.  Normal percussion heart: regular rate and rhythm with normal S1 and S2, no S3 or S4.  PMI is normal.  No pathological murmurs abdomen: Normal bowel sounds, abdomen is soft and nontender without masses, organomegaly or hernias noted.  No hepatosplenomegaly musculoskeletal: Back normal, normal gait muscle strength and tone normal pulsus: Pulse is normal in all 4 extremities Extremities: No peripheral pitting edema neurologic: Alert and oriented x 3 skin: Intact without lesions or rashes cervical nodes: No significant adenopathy psychologic: Normal affect    Impression &  Recommendations:  Problem # 1:  CAROTID BRUIT, RIGHT (ICD-785.9) the patient has noticed evidence of significant carotid artery disease.  Problem # 2:  HYPERTENSION (ICD-401.9) blood pressures controlled on the current regimen with hydrochlorothiazide.we will change the patient's medical regimen to a combination pill off lisinopril/hydrochlorothiazide 20/25 mg p.o. daily. The following medications were removed from the medication list:    Benazepril Hcl 40 Mg Tabs (Benazepril hcl) .Marland Kitchen... Take 1 tablet by mouth once a day    Hydrochlorothiazide 25 Mg Tabs (Hydrochlorothiazide) .Marland Kitchen... Take one tablet by mouth daily. Her updated medication list for this problem includes:    Atenolol 50 Mg Tabs (Atenolol) .Marland Kitchen... Take 1  tab by mouth qd    Diltiazem Hcl Er Beads 240 Mg Xr24h-cap (Diltiazem hcl er beads) .Marland Kitchen... Take 1 tablet by mouth once a day    Lisinopril-hydrochlorothiazide 20-25 Mg Tabs (Lisinopril-hydrochlorothiazide) .Marland Kitchen... Take 1 tablet by mouth once a day  Problem # 3:  COUGH (ICD-786.2) the patient reports a chronic cough which I suspect is secondary to upper airway cough syndrome.  Asked her  to take an antihistamine.  If her cough is persistent ACE inhibitor can be switched to an ARB. The following medications were removed from the medication list:    Benazepril Hcl 40 Mg Tabs (Benazepril hcl) .Marland Kitchen... Take 1 tablet by mouth once a day Her updated medication list for this problem includes:    Warfarin Sodium 4 Mg Tabs (Warfarin sodium) .Marland Kitchen... Take 1 by mouth qd    Atenolol 50 Mg Tabs (Atenolol) .Marland Kitchen... Take 1  tab by mouth qd    Diltiazem Hcl Er Beads 240 Mg Xr24h-cap (Diltiazem hcl er beads) .Marland Kitchen... Take 1 tablet by mouth once a day    Prednisone 1 Mg Tabs (Prednisone) .Marland Kitchen... Take 4 tablet by mouth once a day    Lisinopril-hydrochlorothiazide 20-25 Mg Tabs (Lisinopril-hydrochlorothiazide) .Marland Kitchen... Take 1 tablet by mouth once a day  Problem # 4:  ATRIAL FIBRILLATION (ICD-427.31) patient on warfarin  therapy.  Heart rate is well-controlled. Her updated medication list for this problem includes:    Warfarin Sodium 4 Mg Tabs (Warfarin sodium) .Marland Kitchen... Take 1 by mouth qd    Atenolol 50 Mg Tabs (Atenolol) .Marland Kitchen... Take 1  tab by mouth qd  Patient Instructions: 1)  Stop HCTZ 2)  Stop Benazepril 3)  Change to Lisinopril / HCTZ 20/25mg  daily 4)  Follow up in  1 year Prescriptions: LISINOPRIL-HYDROCHLOROTHIAZIDE 20-25 MG TABS (LISINOPRIL-HYDROCHLOROTHIAZIDE) Take 1 tablet by mouth once a day  #30 x 6   Entered by:   Hoover Brunette, LPN   Authorized by:   Lewayne Bunting, MD, [facility]   Signed by:   Hoover Brunette, LPN on 57/84/6962   Method used:   Print then Give to Patient   RxID:   [id number] LISINOPRIL-HYDROCHLOROTHIAZIDE 20-25 MG TABS (LISINOPRIL-HYDROCHLOROTHIAZIDE) Take 1 tablet by mouth once a day  #30 x 6   Entered by:   Hoover Brunette, LPN   Authorized by:   Lewayne Bunting, MD, [facility]   Signed by:   Hoover Brunette, LPN on 53/66/4403   Method used:   Electronically to        [facility]* (retail)       [address]       [place], [place]  [address]       Ph: [phone]       Fax: [phone]   RxID:   [id number]

## 2010-09-20 NOTE — Medication Information (Signed)
Summary: ccr-lr  Anticoagulant Therapy  Managed by: Vashti Hey, RN PCP: Billie Lade MD: Andee Lineman MD, Michelle Piper Indication 1: Atrial Fibrillation (ICD-427.31) Lab Used: [facility] [place] Site: [facility] INR POC 2.6 INR RANGE 2.0-3.0  Dietary changes: no    Health status changes: no    Bleeding/hemorrhagic complications: no    Recent/future hospitalizations: no    Any changes in medication regimen? no    Recent/future dental: no  Any missed doses?: no       Is patient compliant with meds? yes       Allergies: 1)  ! Sulfa  Anticoagulation Management History:      The patient is taking warfarin and comes in today for a routine follow up visit.  Positive risk factors for bleeding include an age of 75 years or older and history of CVA/TIA.  The bleeding index is 'intermediate risk'.  Positive CHADS2 values include History of HTN, Age > 75 years old, and Prior Stroke/CVA/TIA.  The start date was 06/10/2003.  Anticoagulation responsible provider: Andee Lineman MD, Michelle Piper.  INR POC: 2.6.  Cuvette Lot#: [id number].  Exp: 03/2011.    Anticoagulation Management Assessment/Plan:      The patient's current anticoagulation dose is Warfarin sodium 4 mg  tabs: take 1 by mouth qd.  The target INR is 2 - 3.  The next INR is due 10/12/2010.  Anticoagulation instructions were given to patient.  Results were reviewed/authorized by Vashti Hey, RN.  She was notified by Vashti Hey RN.         Prior Anticoagulation Instructions: INR 2.3 Continue coumadin 4mg  once daily except 2mg  on Tuesdays  Current Anticoagulation Instructions: INR 2.6 Continue coumadin 4mg  once daily except 2mg  on Tuesdays

## 2010-09-20 NOTE — Medication Information (Signed)
Summary: ccr-lr  Anticoagulant Therapy  Managed by: Vashti Hey, RN PCP: Billie Lade MD: Diona Browner MD, Remi Deter Indication 1: Atrial Fibrillation (ICD-427.31) Lab Used: [facility] [place] Site: [facility] Clinic INR POC 2.3 INR RANGE 2.0-3.0  Dietary changes: no    Health status changes: no    Bleeding/hemorrhagic complications: no    Recent/future hospitalizations: no    Any changes in medication regimen? no    Recent/future dental: no  Any missed doses?: no       Is patient compliant with meds? yes       Allergies: 1)  ! Sulfa  Anticoagulation Management History:      The patient is taking warfarin and comes in today for a routine follow up visit.  Positive risk factors for bleeding include an age of 75 years or older and history of CVA/TIA.  The bleeding index is 'intermediate risk'.  Positive CHADS2 values include History of HTN, Age > 30 years old, and Prior Stroke/CVA/TIA.  The start date was 06/10/2003.  Anticoagulation responsible provider: Diona Browner MD, Remi Deter.  INR POC: 2.3.  Exp: 03/2011.    Anticoagulation Management Assessment/Plan:      The patient's current anticoagulation dose is Warfarin sodium 4 mg  tabs: take 1 by mouth qd.  The target INR is 2 - 3.  The next INR is due 09/14/2010.  Anticoagulation instructions were given to patient.  Results were reviewed/authorized by Vashti Hey, RN.  She was notified by Vashti Hey RN.         Prior Anticoagulation Instructions: INR 2.4 Continue coumadin 4mg  once daily except 2mg  on Tuesdays  Current Anticoagulation Instructions: INR 2.3 Continue coumadin 4mg  once daily except 2mg  on Tuesdays

## 2010-10-03 ENCOUNTER — Telehealth (INDEPENDENT_AMBULATORY_CARE_PROVIDER_SITE_OTHER): Payer: Self-pay | Admitting: *Deleted

## 2010-10-05 ENCOUNTER — Encounter (INDEPENDENT_AMBULATORY_CARE_PROVIDER_SITE_OTHER): Payer: Medicare Other

## 2010-10-05 ENCOUNTER — Encounter: Payer: Self-pay | Admitting: Cardiology

## 2010-10-05 DIAGNOSIS — I6789 Other cerebrovascular disease: Secondary | ICD-10-CM

## 2010-10-05 DIAGNOSIS — Z7901 Long term (current) use of anticoagulants: Secondary | ICD-10-CM

## 2010-10-05 DIAGNOSIS — I4891 Unspecified atrial fibrillation: Secondary | ICD-10-CM

## 2010-10-05 LAB — CONVERTED CEMR LAB: POC INR: 3.2

## 2010-10-10 NOTE — Medication Information (Signed)
Summary: ccr-lr  Anticoagulant Therapy  Managed by: Vashti Hey, RN PCP: Billie Lade MD: Myrtis Ser MD, Tinnie Gens Indication 1: Atrial Fibrillation (ICD-427.31) Lab Used: [facility] [place] Site: [facility] Clinic INR POC 3.2 INR RANGE 2.0-3.0  Dietary changes: no    Health status changes: yes       Details: osteo of toe  Bleeding/hemorrhagic complications: no    Recent/future hospitalizations: yes       Details: scheduled for amputation of toe on 10/12/10  Any changes in medication regimen? yes       Details: Been on Doxycycline 100mg  bid x 14 days  Recent/future dental: no  Any missed doses?: no       Is patient compliant with meds? yes      Comments: Pt will be bridged with Lovenox prior to toe surgery  Allergies: 1)  ! Sulfa  Anticoagulation Management History:      The patient is taking warfarin and comes in today for a routine follow up visit.  Positive risk factors for bleeding include an age of 75 years or older and history of CVA/TIA.  The bleeding index is 'intermediate risk'.  Positive CHADS2 values include History of HTN, Age > 22 years old, and Prior Stroke/CVA/TIA.  The start date was 06/10/2003.  Anticoagulation responsible provider: Myrtis Ser MD, Tinnie Gens.  INR POC: 3.2.  Cuvette Lot#: [id number].  Exp: 03/2011.    Anticoagulation Management Assessment/Plan:      The patient's current anticoagulation dose is Warfarin sodium 4 mg  tabs: take 1 by mouth qd.  The target INR is 2 - 3.  The next INR is due 10/16/2010.  Anticoagulation instructions were given to patient.  Results were reviewed/authorized by Vashti Hey, RN.  She was notified by Vashti Hey RN.         Prior Anticoagulation Instructions: INR 2.6 Continue coumadin 4mg  once daily except 2mg  on Tuesdays  Current Anticoagulation Instructions: INR 3.2 Finishes doxycycline today Scheduled for toe amputation 10/12/10 by Dr Voncille Lo stop coumadin 2/18, Start Lovenox 120mg  subcutaneously once  daily on 2/20 with last dose of Lovenox on 2/23/am.  Pt will resume coumadin and Lovenox after surgery till INR theraputic.

## 2010-10-10 NOTE — Progress Notes (Signed)
Summary: NEEDING TO COME OFF OF COUMADIN   Medications Added ENOXAPARIN SODIUM 120 MG/0.8ML SOLN (ENOXAPARIN SODIUM) Administer lovenox 120mg  Subcutaneously daily at 9:00am       Phone Note Call from Patient Call back at Home Phone [phone]   Caller: CRYSTAL W/ [facility] Reason for Call: Talk to Nurse Summary of Call: HAVING A PROCEDURE NEEDING TO COME OFF OF COUMADIN  [phone] Initial call taken by: Claudette Laws,  October 03, 2010 2:00 PM  Follow-up for Phone Call        Discussed with Dr Andee Lineman.  Pt will be bridged with Lovenox due to H/O CVA.  Pt given appt 10/05/10 @ 8:50am to come to office for Lovenox instructions.  Pt aware. Follow-up by: Vashti Hey RN,  October 05, 2010 8:35 AM    New/Updated Medications: ENOXAPARIN SODIUM 120 MG/0.8ML SOLN (ENOXAPARIN SODIUM) Administer lovenox 120mg  Subcutaneously daily at 9:00am Prescriptions: ENOXAPARIN SODIUM 120 MG/0.8ML SOLN (ENOXAPARIN SODIUM) Administer lovenox 120mg  Subcutaneously daily at 9:00am  #10 x 1   Entered by:   Vashti Hey RN   Authorized by:   Lewayne Bunting, MD, [facility]   Signed by:   Vashti Hey RN on 10/05/2010   Method used:   Electronically to        [facility]* (retail)       [address]       [place], [place]  [address]       Ph: [phone]       Fax: [phone]   RxID:   [id number]

## 2010-10-16 ENCOUNTER — Encounter (INDEPENDENT_AMBULATORY_CARE_PROVIDER_SITE_OTHER): Payer: Medicare Other

## 2010-10-16 ENCOUNTER — Encounter: Payer: Self-pay | Admitting: Cardiology

## 2010-10-16 DIAGNOSIS — I4891 Unspecified atrial fibrillation: Secondary | ICD-10-CM

## 2010-10-16 DIAGNOSIS — Z7901 Long term (current) use of anticoagulants: Secondary | ICD-10-CM

## 2010-10-19 ENCOUNTER — Encounter: Payer: Self-pay | Admitting: Cardiology

## 2010-10-19 ENCOUNTER — Encounter (INDEPENDENT_AMBULATORY_CARE_PROVIDER_SITE_OTHER): Payer: Medicare Other

## 2010-10-19 DIAGNOSIS — I4891 Unspecified atrial fibrillation: Secondary | ICD-10-CM

## 2010-10-19 DIAGNOSIS — Z7901 Long term (current) use of anticoagulants: Secondary | ICD-10-CM

## 2010-10-25 NOTE — Medication Information (Signed)
Summary: ccr-lr  Anticoagulant Therapy  Managed by: Vashti Hey, RN PCP: Billie Lade MD: Andee Lineman MD, Michelle Piper Indication 1: Atrial Fibrillation (ICD-427.31) Lab Used: [facility] [place] Site: [facility] INR POC 2.2 INR RANGE 2.0-3.0  Dietary changes: no    Health status changes: no    Bleeding/hemorrhagic complications: no    Recent/future hospitalizations: no    Any changes in medication regimen? no    Recent/future dental: no  Any missed doses?: no       Is patient compliant with meds? yes       Allergies: 1)  ! Sulfa  Anticoagulation Management History:      The patient is taking warfarin and comes in today for a routine follow up visit.  Positive risk factors for bleeding include an age of 75 years or older and history of CVA/TIA.  The bleeding index is 'intermediate risk'.  Positive CHADS2 values include History of HTN, Age > 56 years old, and Prior Stroke/CVA/TIA.  The start date was 06/10/2003.  Anticoagulation responsible provider: Andee Lineman MD, Michelle Piper.  INR POC: 2.2.  Cuvette Lot#: [id number].  Exp: 03/2011.    Anticoagulation Management Assessment/Plan:      The patient's current anticoagulation dose is Warfarin sodium 4 mg  tabs: take 1 by mouth qd.  The target INR is 2 - 3.  The next INR is due 10/30/2010.  Anticoagulation instructions were given to patient.  Results were reviewed/authorized by Vashti Hey, RN.  She was notified by Vashti Hey RN.         Prior Anticoagulation Instructions: INR 1.2 Take coumadin 6mg  x 3 then recheck INR on 10/19/10 Continue Lovenox 120mg  subcutaneously qd  Current Anticoagulation Instructions: INR 2.2 Resume coumadin 4mg  once daily except 2mg  on Tuesdays Stop Lovenox Prescriptions: WARFARIN SODIUM 4 MG  TABS (WARFARIN SODIUM) take 1 by mouth qd  #30 x 3   Entered by:   Vashti Hey RN   Authorized by:   Lewayne Bunting, MD, [facility]   Signed by:   Vashti Hey RN on 10/19/2010   Method used:   Electronically to   [facility] (retail)       [address]       [place], [place]  [address]       Ph: [phone]       Fax: [phone]   RxID:   [id number]

## 2010-10-25 NOTE — Medication Information (Signed)
Summary: ccr-on lovenox-lr  Anticoagulant Therapy  Managed by: Vashti Hey, RN PCP: Billie Lade MD: Diona Browner MD, Remi Deter Indication 1: Atrial Fibrillation (ICD-427.31) Lab Used: [facility] [place] Site: [facility] INR POC 1.2 INR RANGE 2.0-3.0  Dietary changes: no    Health status changes: no    Bleeding/hemorrhagic complications: no    Recent/future hospitalizations: yes       Details: Had 2nd toe on Rt foot amputated on 10/12/10  Any changes in medication regimen? no    Recent/future dental: no  Any missed doses?: yes     Details: Restarted coumadin and Lovenox on 10/13/10  Is patient compliant with meds? yes       Allergies: 1)  ! Sulfa  Anticoagulation Management History:      The patient is taking warfarin and comes in today for a routine follow up visit.  Positive risk factors for bleeding include an age of 75 years or older and history of CVA/TIA.  The bleeding index is 'intermediate risk'.  Positive CHADS2 values include History of HTN, Age > 75 years old, and Prior Stroke/CVA/TIA.  The start date was 06/10/2003.  Anticoagulation responsible provider: Diona Browner MD, Remi Deter.  INR POC: 1.2.  Cuvette Lot#: [id number].  Exp: 03/2011.    Anticoagulation Management Assessment/Plan:      The patient's current anticoagulation dose is Warfarin sodium 4 mg  tabs: take 1 by mouth qd.  The target INR is 2 - 3.  The next INR is due 10/19/2010.  Anticoagulation instructions were given to patient.  Results were reviewed/authorized by Vashti Hey, RN.  She was notified by Vashti Hey RN.         Prior Anticoagulation Instructions: INR 3.2 Finishes doxycycline today Scheduled for toe amputation 10/12/10 by Dr Voncille Lo stop coumadin 2/18, Start Lovenox 120mg  subcutaneously once daily on 2/20 with last dose of Lovenox on 2/23/am.  Pt will resume coumadin and Lovenox after surgery till INR theraputic.  Current Anticoagulation Instructions: INR 1.2 Take  coumadin 6mg  x 3 then recheck INR on 10/19/10 Continue Lovenox 120mg  subcutaneously qd

## 2010-10-30 ENCOUNTER — Encounter: Payer: Self-pay | Admitting: Cardiology

## 2010-10-30 ENCOUNTER — Encounter (INDEPENDENT_AMBULATORY_CARE_PROVIDER_SITE_OTHER): Payer: Medicare Other

## 2010-10-30 DIAGNOSIS — Z7901 Long term (current) use of anticoagulants: Secondary | ICD-10-CM

## 2010-10-30 DIAGNOSIS — I4891 Unspecified atrial fibrillation: Secondary | ICD-10-CM

## 2010-11-06 NOTE — Medication Information (Signed)
Summary: ccr-lr  Anticoagulant Therapy  Managed by: Vashti Hey, RN PCP: Billie Lade MD: Antoine Poche MD, Fayrene Fearing Indication 1: Atrial Fibrillation (ICD-427.31) Lab Used: [facility] [place] Site: [facility] INR POC 2.4 INR RANGE 2.0-3.0  Dietary changes: no    Health status changes: no    Bleeding/hemorrhagic complications: no    Recent/future hospitalizations: no    Any changes in medication regimen? no    Recent/future dental: no  Any missed doses?: no       Is patient compliant with meds? yes       Allergies: 1)  ! Sulfa  Anticoagulation Management History:      The patient is taking warfarin and comes in today for a routine follow up visit.  Positive risk factors for bleeding include an age of 75 years or older and history of CVA/TIA.  The bleeding index is 'intermediate risk'.  Positive CHADS2 values include History of HTN, Age > 75 years old, and Prior Stroke/CVA/TIA.  The start date was 06/10/2003.  Anticoagulation responsible provider: Antoine Poche MD, Fayrene Fearing.  INR POC: 2.4.  Cuvette Lot#: [id number].  Exp: 03/2011.    Anticoagulation Management Assessment/Plan:      The patient's current anticoagulation dose is Warfarin sodium 4 mg  tabs: take 1 by mouth qd.  The target INR is 2 - 3.  The next INR is due 11/27/2010.  Anticoagulation instructions were given to patient.  Results were reviewed/authorized by Vashti Hey, RN.  She was notified by Vashti Hey RN.         Prior Anticoagulation Instructions: INR 2.2 Resume coumadin 4mg  once daily except 2mg  on Tuesdays Stop Lovenox  Current Anticoagulation Instructions: INR 2.4 Continue coumadin 4mg  once daily except 2mg  on Tuesdays

## 2010-11-26 ENCOUNTER — Encounter: Payer: Self-pay | Admitting: Cardiology

## 2010-11-26 DIAGNOSIS — I4891 Unspecified atrial fibrillation: Secondary | ICD-10-CM

## 2010-11-26 DIAGNOSIS — I6350 Cerebral infarction due to unspecified occlusion or stenosis of unspecified cerebral artery: Secondary | ICD-10-CM

## 2010-11-26 DIAGNOSIS — Z7901 Long term (current) use of anticoagulants: Secondary | ICD-10-CM | POA: Insufficient documentation

## 2010-11-27 ENCOUNTER — Ambulatory Visit (INDEPENDENT_AMBULATORY_CARE_PROVIDER_SITE_OTHER): Payer: Medicare Other | Admitting: *Deleted

## 2010-11-27 DIAGNOSIS — Z7901 Long term (current) use of anticoagulants: Secondary | ICD-10-CM

## 2010-11-27 DIAGNOSIS — I6350 Cerebral infarction due to unspecified occlusion or stenosis of unspecified cerebral artery: Secondary | ICD-10-CM

## 2010-11-27 DIAGNOSIS — I4891 Unspecified atrial fibrillation: Secondary | ICD-10-CM

## 2010-11-27 LAB — POCT INR: INR: 2.9

## 2010-12-18 ENCOUNTER — Ambulatory Visit (INDEPENDENT_AMBULATORY_CARE_PROVIDER_SITE_OTHER): Payer: Medicare Other | Admitting: *Deleted

## 2010-12-18 DIAGNOSIS — Z7901 Long term (current) use of anticoagulants: Secondary | ICD-10-CM

## 2010-12-18 DIAGNOSIS — I4891 Unspecified atrial fibrillation: Secondary | ICD-10-CM

## 2010-12-18 DIAGNOSIS — I6350 Cerebral infarction due to unspecified occlusion or stenosis of unspecified cerebral artery: Secondary | ICD-10-CM

## 2011-01-01 NOTE — Op Note (Signed)
[name] [name], GARROW                 ACCOUNT NO.:  [id number]   MEDICAL RECORD NO.:  [id number]          PATIENT TYPE:  AMB   LOCATION:  ENDO                         FACILITY:  [facility]   PHYSICIAN:  Georgiana Spinner, M.D.    DATE OF BIRTH:  02-08-1930   DATE OF PROCEDURE:  01/19/2009  DATE OF DISCHARGE:                               OPERATIVE REPORT   PROCEDURE:  Colonoscopy.   INDICATIONS:  Diarrhea.   ANESTHESIA:  Fentanyl 60 mcg, Versed 5 mg.   PROCEDURE:  With the patient mildly sedated in the left lateral  decubitus position, the Pentax videoscopic pediatric colonoscope was  inserted in the rectum and passed under direct vision rather easily to  the cecum identified by ileocecal valve and appendiceal orifice both  which were photographed.  The valve appeared normal.  From this point  the colonoscope was slowly withdrawn taking circumferential views of  colonic mucosa stopping to take random colonic biopsies along the way  from normal-appearing mucosa until we reached the rectum which appeared  normal on direct and showed hemorrhoids on retroflexed view.  The  endoscope was straightened and withdrawn.  The patient's vital signs,  pulse oximeter remained stable.  The patient tolerated procedure well  without apparent complications.   FINDINGS:  Scattered diverticula seen mostly in the sigmoid colon.  Internal hemorrhoids were also noted, otherwise unremarkable  colonoscopic examination to the cecum.   PLAN:  Await biopsy reports.  The patient will call me for results and  follow-up with me as an outpatient.           ______________________________  Georgiana Spinner, M.D.     GMO/MEDQ  D:  01/19/2009  T:  01/19/2009  Job:  [id number]   cc:   Donzetta Sprung  Fax: [phone]

## 2011-01-04 NOTE — Discharge Summary (Signed)
NAME:  [name] [name], HEWINS                           ACCOUNT NO.:  [id number]   MEDICAL RECORD NO.:  [id number]                   PATIENT TYPE:  INP   LOCATION:  2036                                 FACILITY:  [facility]   PHYSICIAN:  Learta Codding, M.D.                 DATE OF BIRTH:  May 13, 1930   DATE OF ADMISSION:  06/03/2003  DATE OF DISCHARGE:  06/07/2003                           DISCHARGE SUMMARY - REFERRING   DISCHARGE DIAGNOSIS:  Substernal chest pain on admission with finding of  trivial coronary artery disease by left heart catheterization June 06, 2003, ejection fraction greater than 55% and no mitral regurgitation.   SECONDARY DIAGNOSES:  1. Hypertension.  2. Paroxysmal atrial fibrillation on chronic Coumadin.  3. Hyperlipidemia.  4. History of cerebrovascular accident in January 2004.  5. Diverticulosis.  6. Gastroesophageal reflux disease.  7. Type 2 diabetes.   PROCEDURE:  June 06, 2003; left heart catheterization, left  ventriculogram, coronary arteriography.  The study shows that the left main  has mild irregularities.  The LAD has mild disease 20-30%.  The diagonals  are without disease.  Left circumflex is a small caliber vessel with two  marginal branches proximally.  Left circumflex has mild irregularities of  30%.  Right coronary artery is dominant, large caliber, has a posterior  descending and posterior ventricular branch.  Right coronary artery has mild  20-30% narrowing in the mid section.  Ejection fraction greater tan 55%.  Overall left ventricular function well preserved, no mitral regurgitation.   DISCHARGE DISPOSITION:  [name] is ready for discharge post-  procedure day #1 after undergoing left heart catheterization which showed  trivial coronary artery disease.  She had been on Coumadin prior to her  admission at [facility].  Her PT/INR values were allowed to subside  prior to planned catheterization which was done on Monday,  June 06, 2003.  The patient has been chest pain free over the weekend and has been chest  pain free since the catheterization.  The site of the catheterization in the  right groin has no swelling, drainage or erythema.  Patient's right lower  extremity is well perfused.  She has been afebrile this hospitalization.  Mental status clear.  Her laboratories within normal limits.  Her cardiac  enzymes were not elevated on admission.  Electrocardiogram on June 03, 2003, actually showed sinus rhythm with a rate of 58 without any ST-T  changes.   The patient goes home on the following medications:  1. Enteric-coated aspirin 81 mg daily.  2. Lipitor 20 mg daily at bedtime.  3. Cardizem 120 mg daily in the evening.  4. Tenormin 50 mg tablet 1/2 tablet daily.  5. Lotensin hydrochlorothiazide 20/12.5 daily.  6. Coumadin 4 mg daily.  7. Lovenox injection 70 mg subcutaneously twice daily until Coumadin     therapeutic aiming for a target INR of 2  to 2.5.   She is asked not to drive for the next 24 hours.  Not to engage in strenuous  activity for two days.  Discharge diet is low-sodium, low-cholesterol diet.  If she has any problems with her catheterization site, she is to call [phone].  These problems include swelling, erythema, drainage, or increased  pain.  She is to follow up with Dr. Reuel Boom as scheduled.  She will also have  follow up appointment with Dr. Nona Dell of the [facility]  Office in [place] on June 21, 2003, at 2 o'clock in the afternoon and she  has a Coumadin check with the [facility] office Friday, June 10, 2003,  at 9 o'clock in the morning with Delice Bison.   BRIEF HISTORY:  [name] [name] is a 77 year old female.  She was seen  earlier this year in February by Dr. Diona Browner.  The patient at that time had  paroxysmal atrial fibrillation with chest discomfort.  A Cardiolite study  was done September 27, 2002, which showed an ejection fraction of 66% and a   low sum score of two of the fixed anteroseptal defect but no evidence of  ischemia.  She has been treated since that time with beta-blockers, statin  therapy, Diltiazem, ACE inhibitors, and Coumadin.  She presents now with  sudden onset of steady pain in her substernal area.  She is not sure if she  awoke with this or whether it awoke her.  It has been present for the last  three days on and off and lasts about an hour more or less.  Does not  radiate and there have been no exacerbating factors.  She has noted an  increase in dyspnea on exertion, especially when climbing stairs.  Her  initial troponin I is negative.  Electrocardiogram did show flutter waves  versus coarse atrial fibrillation with poor R-wave progression.  It should  be noted that she was asymptomatic when this electrocardiogram was done in  terms of feeling palpitations.  She has cardiac risk factors which are  hypertension and hyperlipidemia with cardiovascular disease and is status  post acute left middle cerebral artery CVA in January of 2004.  Her chest  pain symptoms are concerning for unstable angina.  The patient had a low  risk Cardiolite study in February but now has new symptoms.  It is felt that  we would proceed with diagnostic heart catheterization to define her  coronary anatomy.  Her symptoms can also be arrythmia-induced since she had  chest discomfort in February as well when she was in atrial fibrillation.  The patient is on chronic anticoagulation therapy for her cerebrovascular  accident and for her atrial fibrillation.  She is stable without bleeding  problems.  She will have cardiac enzymes to rule out myocardial infarction.  Coumadin will be held and she will be covered with Lovenox once  subtherapeutic from Coumadin.  Catheterization will be setup when INR is  less than 1.8 and probably will be done October 18 at [facility].  HOSPITAL COURSE:  Ms. Terrel Manalo was admitted to [facility] on  June 04, 2003.  She had been maintained on Lovenox over the weekend and  without chest pain.  She underwent left heart catheterization on June 06, 2003.  The study showed trivial coronary artery disease.  The patient has  been pain-free throughout this hospitalization, and particularly after the  left heart catheterization.  She goes home with medications  which include a  decrease in her Cardizem from 240 mg to 120, and a decrease in her atenolol  from 50 to 25 daily secondary to bradycardia with pauses noted on October  16th and October 17th, 2004.  She has follow up with both Dr. Diona Browner and  the [facility].      Maple Mirza, P.A.                    Learta Codding, M.D.    GM/MEDQ  D:  06/07/2003  T:  06/07/2003  Job:  [id number]   cc:   Jonelle Sidle, M.D. [facility]   Dr. Lyanne Co, [place]

## 2011-01-04 NOTE — Consult Note (Signed)
[facility]                          ENDOCRINOLOGY CONSULTATION   NAME:[name] [name] TOOL                        MRN:          [id number]  DATE:07/24/2006                            DOB:          1930/07/02    REFERRING PHYSICIAN:  Donzetta Sprung   REASON FOR REFERRAL:  Fatigue.   HISTORY OF PRESENT ILLNESS:  A 77 year old woman with 3 months of severe  fatigue. She has moderate associated hair loss diffusely throughout the  head and slight diffuse headache.   PAST MEDICAL HISTORY:  1. Dyslipidemia.  2. CVA 2003.  3. Hypertension.  4. Chronic atrial fibrillation.  5. PMR in 2006.   SOCIAL HISTORY:  She is married, she is retired.   FAMILY HISTORY:  Negative for the above.   REVIEW OF SYSTEMS:  Denies the following:  Fever, weight gain, weight  loss, myalgias, weakness, syncope, chest pain, nausea, vomiting, rectal  bleeding, hematuria, skin rash, anxiety, depression and numbness.   PHYSICAL EXAMINATION:  VITAL SIGNS:  Blood pressure 130/58, heart rate  69, temperature is 97.2, the weight is 163.  GENERAL:  No distress.  SKIN:  Normal texture and temperature, I do not see a rash.  HEENT:  No proptosis, no periorbital swelling.  NECK:  No goiter.  CHEST:  Clear to auscultation. No respiratory distress.  CARDIOVASCULAR:  No JVD, no edema. Regular rate and rhythm, no murmur.  MUSCULOSKELETAL:  Muscle bulk and strength in all 4 extremities is  normal. Gait is observed in the office to be normal.  NEUROLOGIC:  Alert and well-oriented, does not appear anxious nor  depressed and there is no tremor.   LABORATORY DATA:  On July 24, 2006, sodium 131, potassium 3.3,  chloride 95, carbon dioxide 28, glucose (random) 102, BUN 9, creatinine  1.0, calcium 9.2. TSH normal at 0.82. Vitamin B12 normal at 391.   IMPRESSION:  1. Mild hyponatremia probably due to her Monopril/HCT.  2. Mild hypokalemia probably due to the same medication.  3. Fatigue possibly  due to #1 or 2, but she is euthyroid and      eucalcemic. No endocrine cause for these symptoms is found.  4. Hair loss for which no endocrine etiology is found.   PLAN:  I have left the patient a message and told her that all I can  come up with is that supplementing her with potassium chloride may help  her sodium and potassium and perhaps help her feel better. I have told  her with no endocrine cause being found, she should return here p.r.n.  However, I would rather leave the followup and these decisions to Dr.  Reuel Boom.     Sean A. Everardo All, MD  Electronically Signed    SAE/MedQ  DD: 07/24/2006  DT: 07/25/2006  Job #: [id number]   cc:   Donzetta Sprung

## 2011-01-04 NOTE — Assessment & Plan Note (Signed)
[facility]                            [facility] NOTE   NAME:[name], [name] WEISSMAN                        MRN:          [id number]  DATE:05/14/2006                            DOB:          [birth year]    REFERRING PHYSICIAN:  Donzetta [name]   HISTORY OF PRESENT ILLNESS:  The patient is a 77 year old female with a  history of permanent atrial fibrillation.  The patient has been on chronic  Coumadin therapy.  She has been doing well.  She has been previously  diagnosed with polymyalgia rheumatica and has been briefly on prednisone  therapy.  She denies any substernal chest pain, palpitations or syncope.  She does report dyspnea on exertion of 1 month's duration.  She denies,  however, any orthopnea or PND.  She also reports a left-sided headache which  has been intermittent.  She denies claudication.   MEDICATIONS:  1. Lotensin/hydrochlorothiazide 20/12.5 mg.  2. Diltiazem 120 mg p.o. daily.  3. Enteric-coated aspirin.  4. Lipitor.  5. Atenolol 50 mg half a tablet p.o. daily.  6. Warfarin as directed.   PHYSICAL EXAMINATION:  VITAL SIGNS:  Blood pressure 132/64; heart rate is 57  beats per minute.  NECK:  Normal carotid upstroke.  No carotid bruit.  LUNGS:  Clear breath sounds bilaterally.  HEART:  Irregularly rate and rhythm, normal S1 and S2.  No pathological  murmurs.  ABDOMEN:  Soft and nontender.  No rebound or guarding.  Good bowel sounds.  EXTREMITIES:  Exam reveal no cyanosis or clubbing.  There is left-sided leg  edema, but there is no edema on the right side.  The patient states that she  has chronic swelling of the left ankle after a ligament sprain.   PROBLEMS:  1. Permanent atrial fibrillation, rate controlled.  2. History of polymyalgia rheumatica.  3. History of substernal chest pain, status post cardiac catheterization      with no evidence of coronary artery disease, catheterization in 2004.  4. History of cerebrovascular  accident 3-1/2 years ago with residual      weakness on the right side.  5. Coumadin anticoagulation.  6. Dyspnea of recent onset.   PLAN:  1. The patient's dyspnea of recent onset is of unclear etiology; I doubt      it is related to her atrial fibrillation, although the patient could      have diastolic dysfunction.  We will obtain an echocardiographic study      to assess her LV function.  2. If the echocardiogram is within normal limits, I do not think the      patient needs further ischemia testing.  I would recommend, however, a      chest x-ray, PA and lateral, and PFTs and this can be deferred after      the patient has been seen by Dr. Reuel [name].  3. INR will be checked in the office today.       [name] Codding, MD,FACC     GED/MedQ  DD:  05/14/2006  DT:  05/16/2006  Job #:  [id number]  cc:   [name] [name]

## 2011-01-04 NOTE — Cardiovascular Report (Signed)
NAME:  [name] [name], [name] [name]                           ACCOUNT NO.:  [id number]   MEDICAL RECORD NO.:  [id number]                   PATIENT TYPE:  INP   LOCATION:  2036                                 FACILITY:  [facility]   PHYSICIAN:  Veneda Melter, M.D.                   DATE OF BIRTH:  August 31, 1929   DATE OF PROCEDURE:  06/06/2003  DATE OF DISCHARGE:                              CARDIAC CATHETERIZATION   PROCEDURES PERFORMED:  1. Left heart catheterization.  2. Left ventriculogram.  3. Selective coronary angiography.   DIAGNOSES:  1. Mild coronary artery disease by angiogram.  2. Normal left ventricular systolic function.   HISTORY:  [name] [name] is a 77 year old female with a history of atrial  fibrillation and stroke on chronic Coumadin therapy who presents with  substernal chest discomfort.  The patient had undergone stress imaging study  in February showing mild ischemia that was medically managed.  Due to  recurrence of symptoms she is admitted to the hospital, stabilized  medically, and referred for further cardiac assessment.   TECHNIQUE:  Informed consent was obtained.  The patient was brought to the  catheterization laboratory.  A 6-French sheath placed in the right femoral  artery using modified Seldinger technique.  6-French [id number] and [id number] catheter  was then used to engage the left and right coronary arteries.  Selective  angiography performed in various projections using manual injections of  contrast.  A 6-French pigtail catheter was advanced in the left ventricle  and left ventriculogram performed using power injections of contrast.  At  the termination of this case catheters were removed and sheaths secured in  position.  The patient was transferred to the holding area.  The sheath will  be removed when the ACT returns normal.  She tolerated procedure well.   FINDINGS:  1. Left main trunk:  Mild irregularities.  2. LAD is a medium caliber vessel that provides two diagonal  branches.  The     LAD has mild disease of 20-30%.  3. Left circumflex artery is a small caliber vessel.  Provides two marginal     branches in the proximal segment.  The left circumflex system has mild     irregularities of 30%.  4. Right coronary artery is dominant.  This is a large caliber vessel.     Provides posterior descending artery and posterior ventricular branch in     terminal segment.  The right coronary artery has mild narrowing of 20-30%     in the mid section.  5. LV:  Normal end-systolic and end-diastolic dimensions.  Overall left     ventricular function is well preserved.  Ejection fraction greater than     55%.  No mitral regurgitation.  LV pressure is 140/5.  Aortic is 140/50.     LVEDP equals 20.    ASSESSMENT/PLAN:  [name] [name] is a 77 year old female with  chest discomfort.  She has trivial coronary artery disease and well preserved left ventricular  function.  Continued medical therapy and risk factor modification will be  pursued and other causes of chest pain investigated.                                               Veneda Melter, M.D.    NG/MEDQ  D:  06/06/2003  T:  06/06/2003  Job:  [id number]   cc:   Donzetta Sprung  [address], [address]  [place]  [place] [address]  Fax: [phone]   Jonelle Sidle, M.D. [facility]

## 2011-01-04 NOTE — Assessment & Plan Note (Signed)
[facility]                          [facility] NOTE   NAME:[name] [name], [name] [name]                        MRN:          [id number]  DATE:12/16/2006                            DOB:          1930-01-27    HISTORY OF PRESENT ILLNESS:  The patient is a 77 year old female with  history of permanent atrial fibrillation.  The patient has been  maintained on Coumadin therapy.  She is scheduled for INR check today.   When she was last seen she complained of dyspnea.  The patient received  an extensive evaluation.  In retrospect it turns out that she actually  may [place]  She states now she has felt the best in a long time and they are fixing  her trailer per the company.  She denies any substernal chest pain,  shortness of breath, orthopnea, PND, she has no palpitations or syncope.   MEDICATIONS:  1. Lotensin hydrochlorothiazide 20/12.5 two times daily.  2. Diltiazem 120 mg p.o. daily.  3. Lipitor 20 mg p.o. daily.  4. Atenolol 25 in the morning and 50 in the p.m.  5. Coumadin as directed.  6. Prednisone taper.  7. Bi __________ Fosamax.   PHYSICAL EXAMINATION:  VITAL SIGNS:  Blood pressure 139/75, heart rate  81 beats per minute, weighs 167 pounds.  NECK:  Normal carotid upstroke, no carotid bruits.  LUNGS:  Clear, breath sounds bilaterally.  HEART:  Regular rate and rhythm, normal S1, S2; no murmur, rubs, or  gallops.  ABDOMEN:  Soft.  EXTREMITY:  No cyanosis, clubbing, or edema.   PROBLEM LIST:  1. Permanent atrial fibrillation.  2. Polymyalgia rheumatica.  3. ALLERGY TO FORMALDEHYDE.  4. History of CVA with residual weakness on the right side.  5. History of chest pain with no evidence of coronary disease by      catheterization.  6. Coumadin anticoagulation.   PLAN:  1. The patient's dyspnea has resolved and may have well been secondary      to exposures of formaldehyde.  2.  The patient is stable from a cardiovascular perspective.  Her      atrial fibrillation is rate      controlled, we will continue her on Coumadin.  3. The patient can follow up with [place] in one year.     Learta Codding, MD,FACC     GED/MedQ  DD: 12/16/2006  DT: 12/16/2006  Job #: [id number]   cc:   Donzetta Sprung

## 2011-01-22 ENCOUNTER — Ambulatory Visit (INDEPENDENT_AMBULATORY_CARE_PROVIDER_SITE_OTHER): Payer: Medicare Other | Admitting: *Deleted

## 2011-01-22 DIAGNOSIS — I4891 Unspecified atrial fibrillation: Secondary | ICD-10-CM

## 2011-01-22 DIAGNOSIS — Z7901 Long term (current) use of anticoagulants: Secondary | ICD-10-CM

## 2011-01-22 DIAGNOSIS — I6350 Cerebral infarction due to unspecified occlusion or stenosis of unspecified cerebral artery: Secondary | ICD-10-CM

## 2011-01-22 LAB — POCT INR: INR: 2.7

## 2011-01-22 MED ORDER — WARFARIN SODIUM 4 MG PO TABS: 4.0000 mg | ORAL_TABLET | ORAL | Status: DC

## 2011-01-23 ENCOUNTER — Other Ambulatory Visit: Payer: Self-pay | Admitting: *Deleted

## 2011-01-23 MED ORDER — WARFARIN SODIUM 4 MG PO TABS: 4.0000 mg | ORAL_TABLET | ORAL | Status: DC

## 2011-01-31 ENCOUNTER — Encounter: Payer: Self-pay | Admitting: Cardiology

## 2011-02-19 ENCOUNTER — Ambulatory Visit (INDEPENDENT_AMBULATORY_CARE_PROVIDER_SITE_OTHER): Payer: Medicare Other | Admitting: *Deleted

## 2011-02-19 DIAGNOSIS — Z7901 Long term (current) use of anticoagulants: Secondary | ICD-10-CM

## 2011-02-19 DIAGNOSIS — I6350 Cerebral infarction due to unspecified occlusion or stenosis of unspecified cerebral artery: Secondary | ICD-10-CM

## 2011-02-19 DIAGNOSIS — I4891 Unspecified atrial fibrillation: Secondary | ICD-10-CM

## 2011-02-19 LAB — POCT INR: INR: 2.8

## 2011-03-15 ENCOUNTER — Ambulatory Visit (INDEPENDENT_AMBULATORY_CARE_PROVIDER_SITE_OTHER): Payer: Medicare Other | Admitting: Cardiology

## 2011-03-15 ENCOUNTER — Encounter: Payer: Self-pay | Admitting: Cardiology

## 2011-03-15 ENCOUNTER — Ambulatory Visit (INDEPENDENT_AMBULATORY_CARE_PROVIDER_SITE_OTHER): Payer: Medicare Other | Admitting: *Deleted

## 2011-03-15 VITALS — BP 136/77 | HR 51 | Resp 18 | Ht 67.0 in | Wt 172.8 lb

## 2011-03-15 DIAGNOSIS — Z7901 Long term (current) use of anticoagulants: Secondary | ICD-10-CM

## 2011-03-15 DIAGNOSIS — I4891 Unspecified atrial fibrillation: Secondary | ICD-10-CM

## 2011-03-15 DIAGNOSIS — I6350 Cerebral infarction due to unspecified occlusion or stenosis of unspecified cerebral artery: Secondary | ICD-10-CM

## 2011-03-15 DIAGNOSIS — R9431 Abnormal electrocardiogram [ECG] [EKG]: Secondary | ICD-10-CM

## 2011-03-15 DIAGNOSIS — R059 Cough, unspecified: Secondary | ICD-10-CM

## 2011-03-15 DIAGNOSIS — I10 Essential (primary) hypertension: Secondary | ICD-10-CM

## 2011-03-15 DIAGNOSIS — R05 Cough: Secondary | ICD-10-CM

## 2011-03-15 DIAGNOSIS — R0989 Other specified symptoms and signs involving the circulatory and respiratory systems: Secondary | ICD-10-CM

## 2011-03-15 DIAGNOSIS — I639 Cerebral infarction, unspecified: Secondary | ICD-10-CM

## 2011-03-15 LAB — POCT INR: INR: 2.5

## 2011-03-15 NOTE — Patient Instructions (Signed)
Continue all current medications. Your physician wants you to follow up in:  1 year.  You will receive a reminder letter in the mail one-two months in advance.  If you don't receive a letter, please call our office to schedule the follow up appointment   

## 2011-03-17 ENCOUNTER — Encounter: Payer: Self-pay | Admitting: Cardiology

## 2011-03-17 DIAGNOSIS — I639 Cerebral infarction, unspecified: Secondary | ICD-10-CM | POA: Insufficient documentation

## 2011-03-17 DIAGNOSIS — R05 Cough: Secondary | ICD-10-CM | POA: Insufficient documentation

## 2011-03-17 DIAGNOSIS — R059 Cough, unspecified: Secondary | ICD-10-CM | POA: Insufficient documentation

## 2011-03-17 DIAGNOSIS — R9431 Abnormal electrocardiogram [ECG] [EKG]: Secondary | ICD-10-CM | POA: Insufficient documentation

## 2011-03-17 NOTE — Progress Notes (Signed)
HPI The patient is a 77 year old female with a history of permanent atrial fibrillation. She status post prior CVA on Coumadin. She also has a history of hypertension which is now controlled. She has a history of peripheral vascular disease with a right-sided carotid bruit. However prior carotid Dopplers showed significant carotid artery disease. She also has history of chronic cough secondary to upper airway cough syndrome. Currently she is tolerating ACE inhibitor and has no recurrent cough. She states that she feels well. She reports no palpitations presyncope or syncope. She has no shortness of breath orthopnea or PND. She had a prior cardiac catheterization in 2004 which showed no significant coronary artery disease. EKG does show poor R-wave progression and an old anterior wall myocardial infarction cannot be ruled out based on electrocardiogram. However the patient reports no chest pain or symptoms suggestive of ischemia.  Allergies  Allergen Reactions  . Sulfonamide Derivatives     Current Outpatient Prescriptions on File Prior to Visit  Medication Sig Dispense Refill  . atenolol (TENORMIN) 50 MG tablet Take 50 mg by mouth daily.        Marland Kitchen atorvastatin (LIPITOR) 20 MG tablet Take 20 mg by mouth daily.        . Calcium Carbonate-Vitamin D (CALTRATE 600+D) 600-400 MG-UNIT per tablet Take 1 tablet by mouth daily.        Marland Kitchen diltiazem (DILACOR XR) 240 MG 24 hr capsule Take 240 mg by mouth daily.        . Glucosamine-Chondroitin 1500-1200 MG/30ML LIQD Take 1,500 mg by mouth daily.        Marland Kitchen lisinopril-hydrochlorothiazide (PRINZIDE,ZESTORETIC) 20-25 MG per tablet Take 1 tablet by mouth daily.        . multivitamin (THERAGRAN) per tablet Take 1 tablet by mouth daily.        . Omega-3 Fatty Acids (OMEGA-3 CF) 1000 MG CAPS Take 1,000 mg by mouth daily.        . predniSONE (DELTASONE) 1 MG tablet Take 4 mg by mouth daily.        Marland Kitchen warfarin (COUMADIN) 4 MG tablet Take 1 tablet (4 mg total) by mouth as  directed.  90 tablet  3  . ascorbic acid (VITAMIN C) 500 MG tablet Take 500 mg by mouth daily.        Marland Kitchen enoxaparin (LOVENOX) 120 MG/0.8ML SOLN Inject 120 mg into the skin. Administer lovenox daily at 9:00 am.         Past Medical History  Diagnosis Date  . Polymyalgia rheumatica   . Stroke   . Hypertension   . Hyperlipidemia   . Arrhythmia     Atrial fibrillation  . Coronary artery disease      nonobstructive coronary artery diseas coronary artery disease by. Preserved LV function.   Marland Kitchen Permanent atrial fibrillation     On Coumadin.  Marland Kitchen Cough     Upper airway cough syndrome tolerating ACE inhibitor.  . Carotid bruit      no significant disease by carotid Dopplers  . Abnormal EKG      no coronary artery disease by catheterization and no change from prior tracing     Past Surgical History  Procedure Date  . Total abdominal hysterectomy   . Appendectomy     Family History  Problem Relation Age of Onset  . Coronary artery disease Other     History   Social History  . Marital Status: Married    Spouse Name: N/A    Number  of Children: N/A  . Years of Education: N/A   Occupational History  . Retired    Social History Main Topics  . Smoking status: Unknown If Ever Smoked  . Smokeless tobacco: Not on file  . Alcohol Use: No  . Drug Use:   . Sexually Active:    Other Topics Concern  . Not on file   Social History Narrative  . No narrative on file   WGN:FAOZHYQMV positives as outlined above. The remainder of the 18  point review of systems is negative   PHYSICAL EXAM BP 136/77  Pulse 51  Resp 18  Ht 5\' 7"  (1.702 m)  Wt 172 lb 12.8 oz (78.382 kg)  BMI 27.06 kg/m2  SpO2 97%  General: Well-developed, well-nourished in no distress Head: Normocephalic and atraumatic Eyes:PERRLA/EOMI intact, conjunctiva and lids normal Ears: No deformity or lesions Mouth:normal dentition, normal posterior pharynx Neck: Supple, no JVD.  No masses, thyromegaly or abnormal  cervical nodes, right carotid bruit Lungs: Normal breath sounds bilaterally without wheezing.  Normal percussion Cardiac: regular rate and rhythm with normal S1 and S2, no S3 or S4.  PMI is normal.  No pathological murmurs Abdomen: Normal bowel sounds, abdomen is soft and nontender without masses, organomegaly or hernias noted.  No hepatosplenomegaly MSK: Back normal, normal gait muscle strength and tone normal Vascular: Pulse is normal in all 4 extremities Extremities: No peripheral pitting edema Neurologic: Alert and oriented x 3 Skin: Intact without lesions or rashes Lymphatics: No significant adenopathy Psychologic: Normal affect   ECG: Atrial fibrillation rate controlled, rhythm/poor R-wave progression/anteroseptal Q waves cannot rule out old anterior wall myocardial infarction.  ASSESSMENT AND PLAN

## 2011-03-17 NOTE — Assessment & Plan Note (Signed)
Blood pressure well controlled on current medical regimen. I made no changes

## 2011-03-17 NOTE — Assessment & Plan Note (Signed)
Patient reports no recurrent symptoms of TIA or CVA. She is on Coumadin and has a history of permanent atrial fibrillation.

## 2011-03-17 NOTE — Assessment & Plan Note (Signed)
Evaluated with carotid Dopplers which were essentially within normal limits.

## 2011-03-17 NOTE — Assessment & Plan Note (Signed)
Permanent atrial fibrillation but with good rate control and asymptomatic. No indication for rhythm control

## 2011-03-17 NOTE — Assessment & Plan Note (Signed)
Patient reports no symptoms consistent with ischemic heart disease. She had a catheterization in 2004 with trivial coronary artery disease.

## 2011-03-17 NOTE — Assessment & Plan Note (Signed)
No recurrent problems. 

## 2011-04-12 ENCOUNTER — Ambulatory Visit (INDEPENDENT_AMBULATORY_CARE_PROVIDER_SITE_OTHER): Payer: Medicare Other | Admitting: *Deleted

## 2011-04-12 DIAGNOSIS — Z7901 Long term (current) use of anticoagulants: Secondary | ICD-10-CM

## 2011-04-12 DIAGNOSIS — I6350 Cerebral infarction due to unspecified occlusion or stenosis of unspecified cerebral artery: Secondary | ICD-10-CM

## 2011-04-12 DIAGNOSIS — I4891 Unspecified atrial fibrillation: Secondary | ICD-10-CM

## 2011-05-14 ENCOUNTER — Ambulatory Visit (INDEPENDENT_AMBULATORY_CARE_PROVIDER_SITE_OTHER): Payer: Medicare Other | Admitting: *Deleted

## 2011-05-14 DIAGNOSIS — I6350 Cerebral infarction due to unspecified occlusion or stenosis of unspecified cerebral artery: Secondary | ICD-10-CM

## 2011-05-14 DIAGNOSIS — Z7901 Long term (current) use of anticoagulants: Secondary | ICD-10-CM

## 2011-05-14 DIAGNOSIS — I4891 Unspecified atrial fibrillation: Secondary | ICD-10-CM

## 2011-06-11 ENCOUNTER — Ambulatory Visit (INDEPENDENT_AMBULATORY_CARE_PROVIDER_SITE_OTHER): Payer: Medicare Other | Admitting: *Deleted

## 2011-06-11 DIAGNOSIS — I4891 Unspecified atrial fibrillation: Secondary | ICD-10-CM

## 2011-06-11 DIAGNOSIS — I6350 Cerebral infarction due to unspecified occlusion or stenosis of unspecified cerebral artery: Secondary | ICD-10-CM

## 2011-06-11 DIAGNOSIS — Z7901 Long term (current) use of anticoagulants: Secondary | ICD-10-CM

## 2011-06-11 LAB — POCT INR: INR: 3.8

## 2011-07-02 ENCOUNTER — Ambulatory Visit (INDEPENDENT_AMBULATORY_CARE_PROVIDER_SITE_OTHER): Payer: Medicare Other | Admitting: *Deleted

## 2011-07-02 DIAGNOSIS — Z7901 Long term (current) use of anticoagulants: Secondary | ICD-10-CM

## 2011-07-02 DIAGNOSIS — I4891 Unspecified atrial fibrillation: Secondary | ICD-10-CM

## 2011-07-02 DIAGNOSIS — I6350 Cerebral infarction due to unspecified occlusion or stenosis of unspecified cerebral artery: Secondary | ICD-10-CM

## 2011-07-02 LAB — POCT INR: INR: 3.1

## 2011-07-26 ENCOUNTER — Ambulatory Visit (INDEPENDENT_AMBULATORY_CARE_PROVIDER_SITE_OTHER): Payer: Medicare Other | Admitting: *Deleted

## 2011-07-26 DIAGNOSIS — I4891 Unspecified atrial fibrillation: Secondary | ICD-10-CM

## 2011-07-26 DIAGNOSIS — Z7901 Long term (current) use of anticoagulants: Secondary | ICD-10-CM

## 2011-07-26 DIAGNOSIS — I6350 Cerebral infarction due to unspecified occlusion or stenosis of unspecified cerebral artery: Secondary | ICD-10-CM

## 2011-07-26 DIAGNOSIS — R0602 Shortness of breath: Secondary | ICD-10-CM

## 2011-08-27 ENCOUNTER — Ambulatory Visit (INDEPENDENT_AMBULATORY_CARE_PROVIDER_SITE_OTHER): Payer: Medicare Other | Admitting: *Deleted

## 2011-08-27 DIAGNOSIS — I4891 Unspecified atrial fibrillation: Secondary | ICD-10-CM | POA: Diagnosis not present

## 2011-08-27 DIAGNOSIS — I6350 Cerebral infarction due to unspecified occlusion or stenosis of unspecified cerebral artery: Secondary | ICD-10-CM

## 2011-08-27 DIAGNOSIS — Z7901 Long term (current) use of anticoagulants: Secondary | ICD-10-CM | POA: Diagnosis not present

## 2011-09-05 DIAGNOSIS — E7800 Pure hypercholesterolemia, unspecified: Secondary | ICD-10-CM | POA: Diagnosis not present

## 2011-09-05 DIAGNOSIS — I10 Essential (primary) hypertension: Secondary | ICD-10-CM | POA: Diagnosis not present

## 2011-09-05 DIAGNOSIS — E119 Type 2 diabetes mellitus without complications: Secondary | ICD-10-CM | POA: Diagnosis not present

## 2011-09-13 DIAGNOSIS — IMO0002 Reserved for concepts with insufficient information to code with codable children: Secondary | ICD-10-CM | POA: Diagnosis not present

## 2011-09-13 DIAGNOSIS — M810 Age-related osteoporosis without current pathological fracture: Secondary | ICD-10-CM | POA: Diagnosis not present

## 2011-09-13 DIAGNOSIS — M1990 Unspecified osteoarthritis, unspecified site: Secondary | ICD-10-CM | POA: Diagnosis not present

## 2011-09-13 DIAGNOSIS — I6789 Other cerebrovascular disease: Secondary | ICD-10-CM | POA: Diagnosis not present

## 2011-09-13 DIAGNOSIS — K5730 Diverticulosis of large intestine without perforation or abscess without bleeding: Secondary | ICD-10-CM | POA: Diagnosis not present

## 2011-09-13 DIAGNOSIS — E782 Mixed hyperlipidemia: Secondary | ICD-10-CM | POA: Diagnosis not present

## 2011-09-13 DIAGNOSIS — I10 Essential (primary) hypertension: Secondary | ICD-10-CM | POA: Diagnosis not present

## 2011-09-13 DIAGNOSIS — I4891 Unspecified atrial fibrillation: Secondary | ICD-10-CM | POA: Diagnosis not present

## 2011-09-17 ENCOUNTER — Ambulatory Visit (INDEPENDENT_AMBULATORY_CARE_PROVIDER_SITE_OTHER): Payer: Medicare Other | Admitting: *Deleted

## 2011-09-17 DIAGNOSIS — I4891 Unspecified atrial fibrillation: Secondary | ICD-10-CM

## 2011-09-17 DIAGNOSIS — I6350 Cerebral infarction due to unspecified occlusion or stenosis of unspecified cerebral artery: Secondary | ICD-10-CM

## 2011-09-17 DIAGNOSIS — Z7901 Long term (current) use of anticoagulants: Secondary | ICD-10-CM

## 2011-09-17 LAB — POCT INR: INR: 2.9

## 2011-10-15 ENCOUNTER — Ambulatory Visit (INDEPENDENT_AMBULATORY_CARE_PROVIDER_SITE_OTHER): Payer: Medicare Other | Admitting: *Deleted

## 2011-10-15 DIAGNOSIS — I6350 Cerebral infarction due to unspecified occlusion or stenosis of unspecified cerebral artery: Secondary | ICD-10-CM | POA: Diagnosis not present

## 2011-10-15 DIAGNOSIS — Z7901 Long term (current) use of anticoagulants: Secondary | ICD-10-CM | POA: Diagnosis not present

## 2011-10-15 DIAGNOSIS — I4891 Unspecified atrial fibrillation: Secondary | ICD-10-CM | POA: Diagnosis not present

## 2011-10-16 DIAGNOSIS — D485 Neoplasm of uncertain behavior of skin: Secondary | ICD-10-CM | POA: Diagnosis not present

## 2011-10-16 DIAGNOSIS — L570 Actinic keratosis: Secondary | ICD-10-CM | POA: Diagnosis not present

## 2011-10-16 DIAGNOSIS — Z85828 Personal history of other malignant neoplasm of skin: Secondary | ICD-10-CM | POA: Diagnosis not present

## 2011-10-16 DIAGNOSIS — D2360 Other benign neoplasm of skin of unspecified upper limb, including shoulder: Secondary | ICD-10-CM | POA: Diagnosis not present

## 2011-10-23 ENCOUNTER — Telehealth: Payer: Self-pay | Admitting: *Deleted

## 2011-10-23 ENCOUNTER — Other Ambulatory Visit: Payer: Self-pay | Admitting: Cardiology

## 2011-10-23 ENCOUNTER — Encounter: Payer: Self-pay | Admitting: Cardiology

## 2011-10-23 ENCOUNTER — Encounter: Payer: Self-pay | Admitting: *Deleted

## 2011-10-23 ENCOUNTER — Ambulatory Visit (INDEPENDENT_AMBULATORY_CARE_PROVIDER_SITE_OTHER): Payer: Medicare Other | Admitting: Cardiology

## 2011-10-23 VITALS — BP 119/65 | HR 74 | Ht 67.0 in | Wt 163.0 lb

## 2011-10-23 DIAGNOSIS — I509 Heart failure, unspecified: Secondary | ICD-10-CM

## 2011-10-23 DIAGNOSIS — R0600 Dyspnea, unspecified: Secondary | ICD-10-CM | POA: Insufficient documentation

## 2011-10-23 DIAGNOSIS — I4891 Unspecified atrial fibrillation: Secondary | ICD-10-CM | POA: Diagnosis not present

## 2011-10-23 NOTE — Patient Instructions (Signed)
Follow up as scheduled. Your physician recommends that you continue on your current medications as directed. Please refer to the Current Medication list given to you today. Your physician has requested that you have a lexiscan myoview. For further information please visit [email or web address] Please follow instruction sheet, as given.

## 2011-10-23 NOTE — Assessment & Plan Note (Addendum)
The patient does appear to have diastolic dysfunction. I do need to exclude ischemia. I don't think she'd be a little walk on treadmill so she will have a [employer].  I did review for her the physiology of diastolic dysfunction and we began does talk about salt restriction and fluid restriction.

## 2011-10-23 NOTE — Progress Notes (Signed)
HPI The patient presents for evaluation of dyspnea. She has a history of permanent atrial fibrillation. Around December she noticed increasing shortness of breath. She was initially treated for bronchitis. However, chest x-ray subsequently suggested pulmonary edema. A BNP level was 418. I did review an echocardiogram which demonstrates a well preserved ejection fraction. Left atrium was moderately dilated. There were no significant valvular abnormalities however. She was treated with diuretics and has had improvement though she's not completely at baseline. She will get short of breath doing activities such as vacuuming. She's not describing PND or orthopnea. She doesn't really notice her palpitations. She doesn't have any presyncope or syncope.  She has been having some chest discomfort. This is sporadic. It is mid chest. She describes some stinging and it has. There may be occasional nausea. It lasts for a couple of hours at a time. She doesn't notice bring it on with activity. It goes away on its own.  She doesn't recall having this in the past.  Allergies  Allergen Reactions  . Sulfonamide Derivatives     Current Outpatient Prescriptions  Medication Sig Dispense Refill  . aspirin EC 81 MG tablet Take 81 mg by mouth 3 (three) times a week.      Marland Kitchen atenolol (TENORMIN) 50 MG tablet Take 50 mg by mouth daily.        Marland Kitchen atorvastatin (LIPITOR) 20 MG tablet Take 20 mg by mouth daily.        . Biotin 1000 MCG tablet Take 1,000 mcg by mouth daily.      . Calcium Carbonate-Vitamin D (CALTRATE 600+D) 600-400 MG-UNIT per tablet Take 1 tablet by mouth daily.        . citalopram (CELEXA) 20 MG tablet Take 20 mg by mouth daily.      Marland Kitchen diltiazem (DILACOR XR) 240 MG 24 hr capsule Take 240 mg by mouth daily.        . furosemide (LASIX) 20 MG tablet Take 20 mg by mouth daily.      Marland Kitchen losartan-hydrochlorothiazide (HYZAAR) 100-12.5 MG per tablet Take 1 tablet by mouth daily.      . Omega-3 Fatty Acids (FISH OIL)  600 MG CAPS Take 1 capsule by mouth daily.      . PredniSONE 1 MG TBEC Take 2 tablets by mouth daily.      Marland Kitchen warfarin (COUMADIN) 4 MG tablet Take 1 tablet (4 mg total) by mouth as directed.  90 tablet  3    Past Medical History  Diagnosis Date  . Polymyalgia rheumatica   . Stroke   . Hypertension   . Hyperlipidemia   . Arrhythmia     Atrial fibrillation  . Coronary artery disease      nonobstructive coronary artery diseas coronary artery disease by. Preserved LV function.   Marland Kitchen Permanent atrial fibrillation     On Coumadin.  Marland Kitchen Cough     Upper airway cough syndrome tolerating ACE inhibitor.  . Carotid bruit      no significant disease by carotid Dopplers  . Abnormal EKG      no coronary artery disease by catheterization and no change from prior tracing     Past Surgical History  Procedure Date  . Total abdominal hysterectomy   . Appendectomy     ROS:  As stated in the HPI and negative for all other systems.  PHYSICAL EXAM BP 119/65  Pulse 74  Ht 5\' 7"  (1.702 m)  Wt 163 lb (73.936 kg)  BMI  25.53 kg/m2  SpO2 98% GENERAL:  Well appearing HEENT:  Pupils equal round and reactive, fundi not visualized, oral mucosa unremarkable NECK:  No jugular venous distention, waveform within normal limits, carotid upstroke brisk and symmetric, right carotid bruits, no thyromegaly LYMPHATICS:  No cervical, inguinal adenopathy LUNGS:  Clear to auscultation bilaterally BACK:  No CVA tenderness CHEST:  Unremarkable HEART:  PMI not displaced or sustained,S1 and S2 within normal limits, no S3, no clicks, no rubs, no murmurs, irregular ABD:  Flat, positive bowel sounds normal in frequency in pitch, no bruits, no rebound, no guarding, no midline pulsatile mass, no hepatomegaly, no splenomegaly EXT:  2 plus pulses throughout, no edema, no cyanosis no clubbing SKIN:  No rashes no nodules NEURO:  Cranial nerves II through XII grossly intact, motor grossly intact throughout PSYCH:  Cognitively  intact, oriented to person place and time  EKG:  Atrial fibrillation, axis within normal limits, intervals within normal limits, nonspecific anterolateral ST depression, poor anterior R wave progression cannot exclude old anteroseptal infarct. 10/23/2011  ASSESSMENT AND PLAN

## 2011-10-23 NOTE — Assessment & Plan Note (Signed)
The blood pressure is at target. No change in medications is indicated. We will continue with therapeutic lifestyle changes (TLC).  

## 2011-10-23 NOTE — Telephone Encounter (Signed)
Pt has Medicare and BCBS Medicare Supplement.  No precert required.

## 2011-10-23 NOTE — Telephone Encounter (Signed)
lexiscan myoview Scheduled for 10-30-2011 @ [facility] Checking percert

## 2011-10-23 NOTE — Assessment & Plan Note (Signed)
The patient  tolerates this rhythm and rate control and anticoagulation. We will continue with the meds as listed.  

## 2011-10-28 DIAGNOSIS — H26499 Other secondary cataract, unspecified eye: Secondary | ICD-10-CM | POA: Diagnosis not present

## 2011-10-28 DIAGNOSIS — H34239 Retinal artery branch occlusion, unspecified eye: Secondary | ICD-10-CM | POA: Diagnosis not present

## 2011-10-30 DIAGNOSIS — R0602 Shortness of breath: Secondary | ICD-10-CM | POA: Diagnosis not present

## 2011-10-30 DIAGNOSIS — R079 Chest pain, unspecified: Secondary | ICD-10-CM | POA: Diagnosis not present

## 2011-10-30 DIAGNOSIS — I509 Heart failure, unspecified: Secondary | ICD-10-CM | POA: Diagnosis not present

## 2011-10-30 DIAGNOSIS — I4891 Unspecified atrial fibrillation: Secondary | ICD-10-CM | POA: Diagnosis not present

## 2011-10-31 ENCOUNTER — Encounter: Payer: Self-pay | Admitting: *Deleted

## 2011-11-11 DIAGNOSIS — H905 Unspecified sensorineural hearing loss: Secondary | ICD-10-CM | POA: Diagnosis not present

## 2011-11-12 ENCOUNTER — Ambulatory Visit (INDEPENDENT_AMBULATORY_CARE_PROVIDER_SITE_OTHER): Payer: Medicare Other | Admitting: *Deleted

## 2011-11-12 DIAGNOSIS — I4891 Unspecified atrial fibrillation: Secondary | ICD-10-CM | POA: Diagnosis not present

## 2011-11-12 DIAGNOSIS — I6350 Cerebral infarction due to unspecified occlusion or stenosis of unspecified cerebral artery: Secondary | ICD-10-CM

## 2011-11-12 DIAGNOSIS — Z7901 Long term (current) use of anticoagulants: Secondary | ICD-10-CM | POA: Diagnosis not present

## 2011-11-25 DIAGNOSIS — Z1231 Encounter for screening mammogram for malignant neoplasm of breast: Secondary | ICD-10-CM | POA: Diagnosis not present

## 2011-12-01 DIAGNOSIS — S82843A Displaced bimalleolar fracture of unspecified lower leg, initial encounter for closed fracture: Secondary | ICD-10-CM | POA: Diagnosis not present

## 2011-12-01 DIAGNOSIS — Z8249 Family history of ischemic heart disease and other diseases of the circulatory system: Secondary | ICD-10-CM | POA: Diagnosis not present

## 2011-12-01 DIAGNOSIS — K5900 Constipation, unspecified: Secondary | ICD-10-CM | POA: Diagnosis not present

## 2011-12-01 DIAGNOSIS — I6350 Cerebral infarction due to unspecified occlusion or stenosis of unspecified cerebral artery: Secondary | ICD-10-CM | POA: Diagnosis not present

## 2011-12-01 DIAGNOSIS — R262 Difficulty in walking, not elsewhere classified: Secondary | ICD-10-CM | POA: Diagnosis not present

## 2011-12-01 DIAGNOSIS — R1110 Vomiting, unspecified: Secondary | ICD-10-CM | POA: Diagnosis not present

## 2011-12-01 DIAGNOSIS — IMO0002 Reserved for concepts with insufficient information to code with codable children: Secondary | ICD-10-CM | POA: Diagnosis not present

## 2011-12-01 DIAGNOSIS — S82899A Other fracture of unspecified lower leg, initial encounter for closed fracture: Secondary | ICD-10-CM | POA: Diagnosis not present

## 2011-12-01 DIAGNOSIS — R52 Pain, unspecified: Secondary | ICD-10-CM | POA: Diagnosis not present

## 2011-12-01 DIAGNOSIS — R55 Syncope and collapse: Secondary | ICD-10-CM | POA: Diagnosis not present

## 2011-12-01 DIAGNOSIS — Z79899 Other long term (current) drug therapy: Secondary | ICD-10-CM | POA: Diagnosis not present

## 2011-12-01 DIAGNOSIS — I509 Heart failure, unspecified: Secondary | ICD-10-CM | POA: Diagnosis not present

## 2011-12-01 DIAGNOSIS — Z5189 Encounter for other specified aftercare: Secondary | ICD-10-CM | POA: Diagnosis not present

## 2011-12-01 DIAGNOSIS — R110 Nausea: Secondary | ICD-10-CM | POA: Diagnosis not present

## 2011-12-01 DIAGNOSIS — I5032 Chronic diastolic (congestive) heart failure: Secondary | ICD-10-CM | POA: Diagnosis not present

## 2011-12-01 DIAGNOSIS — E876 Hypokalemia: Secondary | ICD-10-CM | POA: Diagnosis not present

## 2011-12-01 DIAGNOSIS — R29898 Other symptoms and signs involving the musculoskeletal system: Secondary | ICD-10-CM | POA: Diagnosis present

## 2011-12-01 DIAGNOSIS — E785 Hyperlipidemia, unspecified: Secondary | ICD-10-CM | POA: Diagnosis present

## 2011-12-01 DIAGNOSIS — I10 Essential (primary) hypertension: Secondary | ICD-10-CM | POA: Diagnosis present

## 2011-12-01 DIAGNOSIS — Z882 Allergy status to sulfonamides status: Secondary | ICD-10-CM | POA: Diagnosis not present

## 2011-12-01 DIAGNOSIS — S82209A Unspecified fracture of shaft of unspecified tibia, initial encounter for closed fracture: Secondary | ICD-10-CM | POA: Diagnosis not present

## 2011-12-01 DIAGNOSIS — M25579 Pain in unspecified ankle and joints of unspecified foot: Secondary | ICD-10-CM | POA: Diagnosis not present

## 2011-12-01 DIAGNOSIS — S82409A Unspecified fracture of shaft of unspecified fibula, initial encounter for closed fracture: Secondary | ICD-10-CM | POA: Diagnosis not present

## 2011-12-01 DIAGNOSIS — A088 Other specified intestinal infections: Secondary | ICD-10-CM | POA: Diagnosis not present

## 2011-12-01 DIAGNOSIS — Z4789 Encounter for other orthopedic aftercare: Secondary | ICD-10-CM | POA: Diagnosis not present

## 2011-12-01 DIAGNOSIS — F329 Major depressive disorder, single episode, unspecified: Secondary | ICD-10-CM | POA: Diagnosis present

## 2011-12-01 DIAGNOSIS — M7981 Nontraumatic hematoma of soft tissue: Secondary | ICD-10-CM | POA: Diagnosis not present

## 2011-12-01 DIAGNOSIS — D62 Acute posthemorrhagic anemia: Secondary | ICD-10-CM | POA: Diagnosis not present

## 2011-12-01 DIAGNOSIS — I69998 Other sequelae following unspecified cerebrovascular disease: Secondary | ICD-10-CM | POA: Diagnosis not present

## 2011-12-01 DIAGNOSIS — M353 Polymyalgia rheumatica: Secondary | ICD-10-CM | POA: Diagnosis present

## 2011-12-01 DIAGNOSIS — Z7901 Long term (current) use of anticoagulants: Secondary | ICD-10-CM | POA: Diagnosis not present

## 2011-12-01 DIAGNOSIS — I4891 Unspecified atrial fibrillation: Secondary | ICD-10-CM | POA: Diagnosis not present

## 2011-12-01 DIAGNOSIS — N179 Acute kidney failure, unspecified: Secondary | ICD-10-CM | POA: Diagnosis not present

## 2011-12-01 DIAGNOSIS — Z823 Family history of stroke: Secondary | ICD-10-CM | POA: Diagnosis not present

## 2011-12-01 DIAGNOSIS — G319 Degenerative disease of nervous system, unspecified: Secondary | ICD-10-CM | POA: Diagnosis not present

## 2011-12-01 DIAGNOSIS — M6281 Muscle weakness (generalized): Secondary | ICD-10-CM | POA: Diagnosis not present

## 2011-12-01 DIAGNOSIS — K5289 Other specified noninfective gastroenteritis and colitis: Secondary | ICD-10-CM | POA: Diagnosis not present

## 2011-12-01 DIAGNOSIS — M1990 Unspecified osteoarthritis, unspecified site: Secondary | ICD-10-CM | POA: Diagnosis present

## 2011-12-01 DIAGNOSIS — E860 Dehydration: Secondary | ICD-10-CM | POA: Diagnosis not present

## 2011-12-01 DIAGNOSIS — I2510 Atherosclerotic heart disease of native coronary artery without angina pectoris: Secondary | ICD-10-CM | POA: Diagnosis not present

## 2011-12-01 DIAGNOSIS — M79609 Pain in unspecified limb: Secondary | ICD-10-CM | POA: Diagnosis not present

## 2011-12-02 DIAGNOSIS — I2510 Atherosclerotic heart disease of native coronary artery without angina pectoris: Secondary | ICD-10-CM

## 2011-12-04 DIAGNOSIS — N179 Acute kidney failure, unspecified: Secondary | ICD-10-CM | POA: Diagnosis not present

## 2011-12-04 DIAGNOSIS — R1032 Left lower quadrant pain: Secondary | ICD-10-CM | POA: Diagnosis not present

## 2011-12-04 DIAGNOSIS — F329 Major depressive disorder, single episode, unspecified: Secondary | ICD-10-CM | POA: Diagnosis not present

## 2011-12-04 DIAGNOSIS — E785 Hyperlipidemia, unspecified: Secondary | ICD-10-CM | POA: Diagnosis not present

## 2011-12-04 DIAGNOSIS — Z4789 Encounter for other orthopedic aftercare: Secondary | ICD-10-CM | POA: Diagnosis not present

## 2011-12-04 DIAGNOSIS — Z5189 Encounter for other specified aftercare: Secondary | ICD-10-CM | POA: Diagnosis not present

## 2011-12-04 DIAGNOSIS — K5900 Constipation, unspecified: Secondary | ICD-10-CM | POA: Diagnosis not present

## 2011-12-04 DIAGNOSIS — R262 Difficulty in walking, not elsewhere classified: Secondary | ICD-10-CM | POA: Diagnosis not present

## 2011-12-04 DIAGNOSIS — I10 Essential (primary) hypertension: Secondary | ICD-10-CM | POA: Diagnosis not present

## 2011-12-04 DIAGNOSIS — M353 Polymyalgia rheumatica: Secondary | ICD-10-CM | POA: Diagnosis not present

## 2011-12-04 DIAGNOSIS — D62 Acute posthemorrhagic anemia: Secondary | ICD-10-CM | POA: Diagnosis not present

## 2011-12-04 DIAGNOSIS — R109 Unspecified abdominal pain: Secondary | ICD-10-CM | POA: Diagnosis not present

## 2011-12-04 DIAGNOSIS — S82843A Displaced bimalleolar fracture of unspecified lower leg, initial encounter for closed fracture: Secondary | ICD-10-CM | POA: Diagnosis not present

## 2011-12-04 DIAGNOSIS — M1990 Unspecified osteoarthritis, unspecified site: Secondary | ICD-10-CM | POA: Diagnosis not present

## 2011-12-04 DIAGNOSIS — M6281 Muscle weakness (generalized): Secondary | ICD-10-CM | POA: Diagnosis not present

## 2011-12-04 DIAGNOSIS — R29898 Other symptoms and signs involving the musculoskeletal system: Secondary | ICD-10-CM | POA: Diagnosis not present

## 2011-12-04 DIAGNOSIS — N9489 Other specified conditions associated with female genital organs and menstrual cycle: Secondary | ICD-10-CM | POA: Diagnosis not present

## 2011-12-04 DIAGNOSIS — R52 Pain, unspecified: Secondary | ICD-10-CM | POA: Diagnosis not present

## 2011-12-04 DIAGNOSIS — I4891 Unspecified atrial fibrillation: Secondary | ICD-10-CM | POA: Diagnosis not present

## 2011-12-04 DIAGNOSIS — I69998 Other sequelae following unspecified cerebrovascular disease: Secondary | ICD-10-CM | POA: Diagnosis not present

## 2011-12-04 DIAGNOSIS — I5032 Chronic diastolic (congestive) heart failure: Secondary | ICD-10-CM | POA: Diagnosis not present

## 2011-12-04 DIAGNOSIS — I509 Heart failure, unspecified: Secondary | ICD-10-CM | POA: Diagnosis not present

## 2011-12-04 DIAGNOSIS — E876 Hypokalemia: Secondary | ICD-10-CM | POA: Diagnosis not present

## 2011-12-04 DIAGNOSIS — M7981 Nontraumatic hematoma of soft tissue: Secondary | ICD-10-CM | POA: Diagnosis not present

## 2011-12-10 DIAGNOSIS — D649 Anemia, unspecified: Secondary | ICD-10-CM | POA: Diagnosis not present

## 2011-12-10 DIAGNOSIS — S301XXA Contusion of abdominal wall, initial encounter: Secondary | ICD-10-CM | POA: Diagnosis not present

## 2011-12-10 DIAGNOSIS — N9489 Other specified conditions associated with female genital organs and menstrual cycle: Secondary | ICD-10-CM | POA: Diagnosis not present

## 2011-12-10 DIAGNOSIS — E860 Dehydration: Secondary | ICD-10-CM | POA: Diagnosis not present

## 2011-12-10 DIAGNOSIS — R1032 Left lower quadrant pain: Secondary | ICD-10-CM | POA: Diagnosis not present

## 2011-12-11 DIAGNOSIS — Z888 Allergy status to other drugs, medicaments and biological substances status: Secondary | ICD-10-CM | POA: Diagnosis not present

## 2011-12-11 DIAGNOSIS — IMO0002 Reserved for concepts with insufficient information to code with codable children: Secondary | ICD-10-CM | POA: Diagnosis not present

## 2011-12-11 DIAGNOSIS — R52 Pain, unspecified: Secondary | ICD-10-CM | POA: Diagnosis not present

## 2011-12-11 DIAGNOSIS — Z8781 Personal history of (healed) traumatic fracture: Secondary | ICD-10-CM | POA: Diagnosis not present

## 2011-12-11 DIAGNOSIS — Z882 Allergy status to sulfonamides status: Secondary | ICD-10-CM | POA: Diagnosis not present

## 2011-12-11 DIAGNOSIS — R58 Hemorrhage, not elsewhere classified: Secondary | ICD-10-CM | POA: Diagnosis present

## 2011-12-11 DIAGNOSIS — Z5189 Encounter for other specified aftercare: Secondary | ICD-10-CM | POA: Diagnosis not present

## 2011-12-11 DIAGNOSIS — N179 Acute kidney failure, unspecified: Secondary | ICD-10-CM | POA: Diagnosis present

## 2011-12-11 DIAGNOSIS — M129 Arthropathy, unspecified: Secondary | ICD-10-CM | POA: Diagnosis present

## 2011-12-11 DIAGNOSIS — I4891 Unspecified atrial fibrillation: Secondary | ICD-10-CM | POA: Diagnosis not present

## 2011-12-11 DIAGNOSIS — Z7901 Long term (current) use of anticoagulants: Secondary | ICD-10-CM

## 2011-12-11 DIAGNOSIS — K828 Other specified diseases of gallbladder: Secondary | ICD-10-CM | POA: Diagnosis not present

## 2011-12-11 DIAGNOSIS — K5900 Constipation, unspecified: Secondary | ICD-10-CM | POA: Diagnosis present

## 2011-12-11 DIAGNOSIS — N390 Urinary tract infection, site not specified: Secondary | ICD-10-CM | POA: Diagnosis present

## 2011-12-11 DIAGNOSIS — Z9889 Other specified postprocedural states: Secondary | ICD-10-CM | POA: Diagnosis not present

## 2011-12-11 DIAGNOSIS — R109 Unspecified abdominal pain: Secondary | ICD-10-CM | POA: Diagnosis not present

## 2011-12-11 DIAGNOSIS — J449 Chronic obstructive pulmonary disease, unspecified: Secondary | ICD-10-CM | POA: Diagnosis present

## 2011-12-11 DIAGNOSIS — I69998 Other sequelae following unspecified cerebrovascular disease: Secondary | ICD-10-CM | POA: Diagnosis not present

## 2011-12-11 DIAGNOSIS — E860 Dehydration: Secondary | ICD-10-CM | POA: Diagnosis not present

## 2011-12-11 DIAGNOSIS — R791 Abnormal coagulation profile: Secondary | ICD-10-CM | POA: Diagnosis present

## 2011-12-11 DIAGNOSIS — Z4789 Encounter for other orthopedic aftercare: Secondary | ICD-10-CM | POA: Diagnosis not present

## 2011-12-11 DIAGNOSIS — S301XXA Contusion of abdominal wall, initial encounter: Secondary | ICD-10-CM | POA: Diagnosis not present

## 2011-12-11 DIAGNOSIS — I5032 Chronic diastolic (congestive) heart failure: Secondary | ICD-10-CM | POA: Diagnosis present

## 2011-12-11 DIAGNOSIS — M2550 Pain in unspecified joint: Secondary | ICD-10-CM | POA: Diagnosis not present

## 2011-12-11 DIAGNOSIS — T45515A Adverse effect of anticoagulants, initial encounter: Secondary | ICD-10-CM | POA: Diagnosis not present

## 2011-12-11 DIAGNOSIS — R29898 Other symptoms and signs involving the musculoskeletal system: Secondary | ICD-10-CM | POA: Diagnosis present

## 2011-12-11 DIAGNOSIS — F329 Major depressive disorder, single episode, unspecified: Secondary | ICD-10-CM | POA: Diagnosis present

## 2011-12-11 DIAGNOSIS — D649 Anemia, unspecified: Secondary | ICD-10-CM | POA: Diagnosis not present

## 2011-12-11 DIAGNOSIS — M353 Polymyalgia rheumatica: Secondary | ICD-10-CM | POA: Diagnosis present

## 2011-12-11 DIAGNOSIS — R262 Difficulty in walking, not elsewhere classified: Secondary | ICD-10-CM | POA: Diagnosis not present

## 2011-12-11 DIAGNOSIS — M6281 Muscle weakness (generalized): Secondary | ICD-10-CM | POA: Diagnosis not present

## 2011-12-11 DIAGNOSIS — I10 Essential (primary) hypertension: Secondary | ICD-10-CM | POA: Diagnosis not present

## 2011-12-11 DIAGNOSIS — E871 Hypo-osmolality and hyponatremia: Secondary | ICD-10-CM | POA: Diagnosis present

## 2011-12-11 DIAGNOSIS — D62 Acute posthemorrhagic anemia: Secondary | ICD-10-CM | POA: Diagnosis present

## 2011-12-11 DIAGNOSIS — I509 Heart failure, unspecified: Secondary | ICD-10-CM | POA: Diagnosis present

## 2011-12-11 DIAGNOSIS — N2889 Other specified disorders of kidney and ureter: Secondary | ICD-10-CM | POA: Diagnosis not present

## 2011-12-11 DIAGNOSIS — Z79899 Other long term (current) drug therapy: Secondary | ICD-10-CM | POA: Diagnosis not present

## 2011-12-11 DIAGNOSIS — M7981 Nontraumatic hematoma of soft tissue: Secondary | ICD-10-CM | POA: Diagnosis present

## 2011-12-11 DIAGNOSIS — B952 Enterococcus as the cause of diseases classified elsewhere: Secondary | ICD-10-CM | POA: Diagnosis present

## 2011-12-13 DIAGNOSIS — N2889 Other specified disorders of kidney and ureter: Secondary | ICD-10-CM | POA: Diagnosis not present

## 2011-12-13 DIAGNOSIS — R109 Unspecified abdominal pain: Secondary | ICD-10-CM | POA: Diagnosis not present

## 2011-12-13 DIAGNOSIS — K828 Other specified diseases of gallbladder: Secondary | ICD-10-CM | POA: Diagnosis not present

## 2011-12-14 DIAGNOSIS — Z7982 Long term (current) use of aspirin: Secondary | ICD-10-CM | POA: Diagnosis not present

## 2011-12-14 DIAGNOSIS — F329 Major depressive disorder, single episode, unspecified: Secondary | ICD-10-CM | POA: Diagnosis not present

## 2011-12-14 DIAGNOSIS — J189 Pneumonia, unspecified organism: Secondary | ICD-10-CM | POA: Diagnosis not present

## 2011-12-14 DIAGNOSIS — R918 Other nonspecific abnormal finding of lung field: Secondary | ICD-10-CM | POA: Diagnosis not present

## 2011-12-14 DIAGNOSIS — I517 Cardiomegaly: Secondary | ICD-10-CM | POA: Diagnosis not present

## 2011-12-14 DIAGNOSIS — I658 Occlusion and stenosis of other precerebral arteries: Secondary | ICD-10-CM | POA: Diagnosis not present

## 2011-12-14 DIAGNOSIS — M2550 Pain in unspecified joint: Secondary | ICD-10-CM | POA: Diagnosis not present

## 2011-12-14 DIAGNOSIS — I6529 Occlusion and stenosis of unspecified carotid artery: Secondary | ICD-10-CM | POA: Diagnosis not present

## 2011-12-14 DIAGNOSIS — R0602 Shortness of breath: Secondary | ICD-10-CM | POA: Diagnosis not present

## 2011-12-14 DIAGNOSIS — Z8781 Personal history of (healed) traumatic fracture: Secondary | ICD-10-CM | POA: Diagnosis not present

## 2011-12-14 DIAGNOSIS — I5032 Chronic diastolic (congestive) heart failure: Secondary | ICD-10-CM | POA: Diagnosis not present

## 2011-12-14 DIAGNOSIS — T45515A Adverse effect of anticoagulants, initial encounter: Secondary | ICD-10-CM | POA: Diagnosis not present

## 2011-12-14 DIAGNOSIS — K5900 Constipation, unspecified: Secondary | ICD-10-CM | POA: Diagnosis not present

## 2011-12-14 DIAGNOSIS — S82843A Displaced bimalleolar fracture of unspecified lower leg, initial encounter for closed fracture: Secondary | ICD-10-CM | POA: Diagnosis not present

## 2011-12-14 DIAGNOSIS — R52 Pain, unspecified: Secondary | ICD-10-CM | POA: Diagnosis not present

## 2011-12-14 DIAGNOSIS — D62 Acute posthemorrhagic anemia: Secondary | ICD-10-CM | POA: Diagnosis not present

## 2011-12-14 DIAGNOSIS — J90 Pleural effusion, not elsewhere classified: Secondary | ICD-10-CM | POA: Diagnosis not present

## 2011-12-14 DIAGNOSIS — M7981 Nontraumatic hematoma of soft tissue: Secondary | ICD-10-CM | POA: Diagnosis not present

## 2011-12-14 DIAGNOSIS — Z8249 Family history of ischemic heart disease and other diseases of the circulatory system: Secondary | ICD-10-CM | POA: Diagnosis not present

## 2011-12-14 DIAGNOSIS — Z882 Allergy status to sulfonamides status: Secondary | ICD-10-CM | POA: Diagnosis not present

## 2011-12-14 DIAGNOSIS — M353 Polymyalgia rheumatica: Secondary | ICD-10-CM | POA: Diagnosis not present

## 2011-12-14 DIAGNOSIS — Z4789 Encounter for other orthopedic aftercare: Secondary | ICD-10-CM | POA: Diagnosis not present

## 2011-12-14 DIAGNOSIS — E876 Hypokalemia: Secondary | ICD-10-CM | POA: Diagnosis not present

## 2011-12-14 DIAGNOSIS — Z8673 Personal history of transient ischemic attack (TIA), and cerebral infarction without residual deficits: Secondary | ICD-10-CM | POA: Diagnosis not present

## 2011-12-14 DIAGNOSIS — Z87440 Personal history of urinary (tract) infections: Secondary | ICD-10-CM | POA: Diagnosis not present

## 2011-12-14 DIAGNOSIS — R9389 Abnormal findings on diagnostic imaging of other specified body structures: Secondary | ICD-10-CM | POA: Diagnosis not present

## 2011-12-14 DIAGNOSIS — Z5189 Encounter for other specified aftercare: Secondary | ICD-10-CM | POA: Diagnosis not present

## 2011-12-14 DIAGNOSIS — B952 Enterococcus as the cause of diseases classified elsewhere: Secondary | ICD-10-CM | POA: Diagnosis not present

## 2011-12-14 DIAGNOSIS — M79609 Pain in unspecified limb: Secondary | ICD-10-CM | POA: Diagnosis not present

## 2011-12-14 DIAGNOSIS — R799 Abnormal finding of blood chemistry, unspecified: Secondary | ICD-10-CM | POA: Diagnosis not present

## 2011-12-14 DIAGNOSIS — R000 Tachycardia, unspecified: Secondary | ICD-10-CM | POA: Diagnosis not present

## 2011-12-14 DIAGNOSIS — N390 Urinary tract infection, site not specified: Secondary | ICD-10-CM | POA: Diagnosis not present

## 2011-12-14 DIAGNOSIS — I10 Essential (primary) hypertension: Secondary | ICD-10-CM | POA: Diagnosis not present

## 2011-12-14 DIAGNOSIS — R58 Hemorrhage, not elsewhere classified: Secondary | ICD-10-CM | POA: Diagnosis not present

## 2011-12-14 DIAGNOSIS — I509 Heart failure, unspecified: Secondary | ICD-10-CM | POA: Diagnosis not present

## 2011-12-14 DIAGNOSIS — IMO0002 Reserved for concepts with insufficient information to code with codable children: Secondary | ICD-10-CM | POA: Diagnosis not present

## 2011-12-14 DIAGNOSIS — N179 Acute kidney failure, unspecified: Secondary | ICD-10-CM | POA: Diagnosis not present

## 2011-12-14 DIAGNOSIS — M6281 Muscle weakness (generalized): Secondary | ICD-10-CM | POA: Diagnosis not present

## 2011-12-14 DIAGNOSIS — I4891 Unspecified atrial fibrillation: Secondary | ICD-10-CM | POA: Diagnosis not present

## 2011-12-14 DIAGNOSIS — Z79899 Other long term (current) drug therapy: Secondary | ICD-10-CM | POA: Diagnosis not present

## 2011-12-14 DIAGNOSIS — R262 Difficulty in walking, not elsewhere classified: Secondary | ICD-10-CM | POA: Diagnosis not present

## 2011-12-18 DIAGNOSIS — S82843A Displaced bimalleolar fracture of unspecified lower leg, initial encounter for closed fracture: Secondary | ICD-10-CM | POA: Diagnosis not present

## 2011-12-19 DIAGNOSIS — R918 Other nonspecific abnormal finding of lung field: Secondary | ICD-10-CM | POA: Diagnosis not present

## 2011-12-19 DIAGNOSIS — J189 Pneumonia, unspecified organism: Secondary | ICD-10-CM | POA: Diagnosis not present

## 2011-12-19 DIAGNOSIS — I6529 Occlusion and stenosis of unspecified carotid artery: Secondary | ICD-10-CM | POA: Diagnosis not present

## 2011-12-19 DIAGNOSIS — J90 Pleural effusion, not elsewhere classified: Secondary | ICD-10-CM | POA: Diagnosis not present

## 2011-12-19 DIAGNOSIS — I5032 Chronic diastolic (congestive) heart failure: Secondary | ICD-10-CM | POA: Diagnosis not present

## 2011-12-19 DIAGNOSIS — I517 Cardiomegaly: Secondary | ICD-10-CM | POA: Diagnosis not present

## 2011-12-19 DIAGNOSIS — R0602 Shortness of breath: Secondary | ICD-10-CM | POA: Diagnosis not present

## 2011-12-19 DIAGNOSIS — I4891 Unspecified atrial fibrillation: Secondary | ICD-10-CM | POA: Diagnosis not present

## 2011-12-19 DIAGNOSIS — I10 Essential (primary) hypertension: Secondary | ICD-10-CM | POA: Diagnosis not present

## 2011-12-19 DIAGNOSIS — M79609 Pain in unspecified limb: Secondary | ICD-10-CM | POA: Diagnosis not present

## 2011-12-19 DIAGNOSIS — R000 Tachycardia, unspecified: Secondary | ICD-10-CM | POA: Diagnosis not present

## 2011-12-20 DIAGNOSIS — M79609 Pain in unspecified limb: Secondary | ICD-10-CM | POA: Diagnosis not present

## 2011-12-20 DIAGNOSIS — I4891 Unspecified atrial fibrillation: Secondary | ICD-10-CM | POA: Diagnosis not present

## 2011-12-21 DIAGNOSIS — I10 Essential (primary) hypertension: Secondary | ICD-10-CM | POA: Diagnosis not present

## 2011-12-21 DIAGNOSIS — IMO0001 Reserved for inherently not codable concepts without codable children: Secondary | ICD-10-CM | POA: Diagnosis not present

## 2011-12-21 DIAGNOSIS — I509 Heart failure, unspecified: Secondary | ICD-10-CM | POA: Diagnosis not present

## 2011-12-21 DIAGNOSIS — F329 Major depressive disorder, single episode, unspecified: Secondary | ICD-10-CM | POA: Diagnosis not present

## 2011-12-21 DIAGNOSIS — I4891 Unspecified atrial fibrillation: Secondary | ICD-10-CM | POA: Diagnosis not present

## 2011-12-21 DIAGNOSIS — J449 Chronic obstructive pulmonary disease, unspecified: Secondary | ICD-10-CM | POA: Diagnosis not present

## 2011-12-23 ENCOUNTER — Encounter (HOSPITAL_COMMUNITY): Payer: Self-pay

## 2011-12-23 ENCOUNTER — Emergency Department (HOSPITAL_COMMUNITY): Payer: Medicare Other

## 2011-12-23 ENCOUNTER — Inpatient Hospital Stay (HOSPITAL_COMMUNITY)
Admission: EM | Admit: 2011-12-23 | Discharge: 2011-12-25 | DRG: 065 | Disposition: A | Discharge status: Home / Self Care | Payer: Medicare Other | Attending: Internal Medicine | Admitting: Internal Medicine

## 2011-12-23 DIAGNOSIS — E872 Acidosis, unspecified: Secondary | ICD-10-CM | POA: Diagnosis present

## 2011-12-23 DIAGNOSIS — I4891 Unspecified atrial fibrillation: Secondary | ICD-10-CM | POA: Diagnosis not present

## 2011-12-23 DIAGNOSIS — Z7982 Long term (current) use of aspirin: Secondary | ICD-10-CM

## 2011-12-23 DIAGNOSIS — Z8673 Personal history of transient ischemic attack (TIA), and cerebral infarction without residual deficits: Secondary | ICD-10-CM

## 2011-12-23 DIAGNOSIS — G459 Transient cerebral ischemic attack, unspecified: Secondary | ICD-10-CM

## 2011-12-23 DIAGNOSIS — Z7901 Long term (current) use of anticoagulants: Secondary | ICD-10-CM

## 2011-12-23 DIAGNOSIS — I639 Cerebral infarction, unspecified: Secondary | ICD-10-CM

## 2011-12-23 DIAGNOSIS — D72829 Elevated white blood cell count, unspecified: Secondary | ICD-10-CM | POA: Diagnosis present

## 2011-12-23 DIAGNOSIS — R29898 Other symptoms and signs involving the musculoskeletal system: Secondary | ICD-10-CM | POA: Diagnosis present

## 2011-12-23 DIAGNOSIS — I482 Chronic atrial fibrillation, unspecified: Secondary | ICD-10-CM | POA: Diagnosis present

## 2011-12-23 DIAGNOSIS — M353 Polymyalgia rheumatica: Secondary | ICD-10-CM | POA: Diagnosis present

## 2011-12-23 DIAGNOSIS — I6789 Other cerebrovascular disease: Secondary | ICD-10-CM | POA: Diagnosis not present

## 2011-12-23 DIAGNOSIS — R51 Headache: Secondary | ICD-10-CM | POA: Diagnosis not present

## 2011-12-23 DIAGNOSIS — IMO0002 Reserved for concepts with insufficient information to code with codable children: Secondary | ICD-10-CM

## 2011-12-23 DIAGNOSIS — I6330 Cerebral infarction due to thrombosis of unspecified cerebral artery: Secondary | ICD-10-CM | POA: Diagnosis not present

## 2011-12-23 DIAGNOSIS — R4701 Aphasia: Secondary | ICD-10-CM | POA: Diagnosis present

## 2011-12-23 DIAGNOSIS — E785 Hyperlipidemia, unspecified: Secondary | ICD-10-CM | POA: Diagnosis present

## 2011-12-23 DIAGNOSIS — I2510 Atherosclerotic heart disease of native coronary artery without angina pectoris: Secondary | ICD-10-CM | POA: Diagnosis present

## 2011-12-23 DIAGNOSIS — I059 Rheumatic mitral valve disease, unspecified: Secondary | ICD-10-CM | POA: Diagnosis not present

## 2011-12-23 DIAGNOSIS — I517 Cardiomegaly: Secondary | ICD-10-CM | POA: Diagnosis not present

## 2011-12-23 DIAGNOSIS — Z882 Allergy status to sulfonamides status: Secondary | ICD-10-CM | POA: Diagnosis not present

## 2011-12-23 DIAGNOSIS — J90 Pleural effusion, not elsewhere classified: Secondary | ICD-10-CM | POA: Diagnosis present

## 2011-12-23 DIAGNOSIS — I6350 Cerebral infarction due to unspecified occlusion or stenosis of unspecified cerebral artery: Principal | ICD-10-CM | POA: Diagnosis present

## 2011-12-23 DIAGNOSIS — R5381 Other malaise: Secondary | ICD-10-CM | POA: Diagnosis not present

## 2011-12-23 DIAGNOSIS — R29818 Other symptoms and signs involving the nervous system: Secondary | ICD-10-CM | POA: Diagnosis not present

## 2011-12-23 DIAGNOSIS — I10 Essential (primary) hypertension: Secondary | ICD-10-CM | POA: Diagnosis not present

## 2011-12-23 DIAGNOSIS — R5383 Other fatigue: Secondary | ICD-10-CM | POA: Diagnosis not present

## 2011-12-23 DIAGNOSIS — I69998 Other sequelae following unspecified cerebrovascular disease: Secondary | ICD-10-CM | POA: Diagnosis not present

## 2011-12-23 DIAGNOSIS — R4789 Other speech disturbances: Secondary | ICD-10-CM | POA: Diagnosis not present

## 2011-12-23 LAB — CBC
HCT: 37.5 % (ref 36.0–46.0)
Hemoglobin: 12.9 g/dL (ref 12.0–15.0)
RBC: 4.28 MIL/uL (ref 3.87–5.11)
WBC: 13.3 10*3/uL — ABNORMAL HIGH (ref 4.0–10.5)

## 2011-12-23 LAB — CK TOTAL AND CKMB (NOT AT ARMC)
CK, MB: 1.3 ng/mL (ref 0.3–4.0)
Total CK: 62 U/L (ref 7–177)

## 2011-12-23 LAB — POCT I-STAT, CHEM 8
BUN: 17 mg/dL (ref 6–23)
Creatinine, Ser: 1 mg/dL (ref 0.50–1.10)
Glucose, Bld: 115 mg/dL — ABNORMAL HIGH (ref 70–99)
Hemoglobin: 12.9 g/dL (ref 12.0–15.0)
Potassium: 4 mEq/L (ref 3.5–5.1)
TCO2: 20 mmol/L (ref 0–100)

## 2011-12-23 LAB — URINALYSIS, ROUTINE W REFLEX MICROSCOPIC
Bilirubin Urine: NEGATIVE
Glucose, UA: NEGATIVE mg/dL
Hgb urine dipstick: NEGATIVE
Specific Gravity, Urine: 1.011 (ref 1.005–1.030)
Urobilinogen, UA: 0.2 mg/dL (ref 0.0–1.0)

## 2011-12-23 LAB — COMPREHENSIVE METABOLIC PANEL
Alkaline Phosphatase: 96 U/L (ref 39–117)
BUN: 17 mg/dL (ref 6–23)
CO2: 17 mEq/L — ABNORMAL LOW (ref 19–32)
Chloride: 103 mEq/L (ref 96–112)
Creatinine, Ser: 0.91 mg/dL (ref 0.50–1.10)
GFR calc non Af Amer: 58 mL/min — ABNORMAL LOW (ref >90)
Total Bilirubin: 0.8 mg/dL (ref 0.3–1.2)

## 2011-12-23 LAB — DIFFERENTIAL
Lymphocytes Relative: 22 % (ref 12–46)
Lymphs Abs: 3 10*3/uL (ref 0.7–4.0)
Monocytes Absolute: 0.8 10*3/uL (ref 0.1–1.0)
Monocytes Relative: 6 % (ref 3–12)
Neutro Abs: 9.4 10*3/uL — ABNORMAL HIGH (ref 1.7–7.7)

## 2011-12-23 LAB — APTT: aPTT: 32 seconds (ref 24–37)

## 2011-12-23 MED ORDER — SIMVASTATIN 40 MG PO TABS
40.0000 mg | ORAL_TABLET | Freq: Every day | ORAL | Status: DC
  Administered 2011-12-24: 40 mg via ORAL
  Filled 2011-12-23: qty 1

## 2011-12-23 MED ORDER — LOSARTAN POTASSIUM 50 MG PO TABS
100.0000 mg | ORAL_TABLET | Freq: Every day | ORAL | Status: DC
  Administered 2011-12-24 – 2011-12-25 (×2): 100 mg via ORAL
  Filled 2011-12-23 (×2): qty 2

## 2011-12-23 MED ORDER — ASPIRIN 325 MG PO TABS
325.0000 mg | ORAL_TABLET | Freq: Every day | ORAL | Status: DC
  Administered 2011-12-24: 325 mg via ORAL
  Filled 2011-12-23: qty 1

## 2011-12-23 MED ORDER — SENNOSIDES-DOCUSATE SODIUM 8.6-50 MG PO TABS: 1.0000 | ORAL_TABLET | Freq: Every evening | ORAL | Status: DC | PRN

## 2011-12-23 MED ORDER — DILTIAZEM HCL ER COATED BEADS 240 MG PO CP24
240.0000 mg | ORAL_CAPSULE | Freq: Every day | ORAL | Status: DC
  Administered 2011-12-24 – 2011-12-25 (×2): 240 mg via ORAL
  Filled 2011-12-23 (×2): qty 1

## 2011-12-23 MED ORDER — SODIUM CHLORIDE 0.9 % IV SOLN
INTRAVENOUS | Status: DC
  Administered 2011-12-23 – 2011-12-25 (×2): via INTRAVENOUS

## 2011-12-23 MED ORDER — ATENOLOL 50 MG PO TABS
50.0000 mg | ORAL_TABLET | Freq: Every day | ORAL | Status: DC
  Filled 2011-12-23: qty 1

## 2011-12-23 MED ORDER — OMEGA-3-ACID ETHYL ESTERS 1 G PO CAPS
1.0000 | ORAL_CAPSULE | Freq: Every day | ORAL | Status: DC
  Administered 2011-12-24 – 2011-12-25 (×2): 1 g via ORAL
  Filled 2011-12-23 (×2): qty 1

## 2011-12-23 MED ORDER — PREDNISONE 1 MG PO TABS
4.0000 mg | ORAL_TABLET | Freq: Every day | ORAL | Status: DC
  Administered 2011-12-24 – 2011-12-25 (×2): 4 mg via ORAL
  Filled 2011-12-23 (×2): qty 4

## 2011-12-23 MED ORDER — ASPIRIN 300 MG RE SUPP
300.0000 mg | Freq: Every day | RECTAL | Status: DC
  Filled 2011-12-23: qty 1

## 2011-12-23 MED ORDER — ENOXAPARIN SODIUM 40 MG/0.4ML ~~LOC~~ SOLN
40.0000 mg | SUBCUTANEOUS | Status: DC
  Filled 2011-12-23 (×2): qty 0.4

## 2011-12-23 MED ORDER — CALCIUM CARBONATE-VITAMIN D 500-200 MG-UNIT PO TABS
1.0000 | ORAL_TABLET | Freq: Every day | ORAL | Status: DC
  Administered 2011-12-24 – 2011-12-25 (×2): 1 via ORAL
  Filled 2011-12-23 (×2): qty 1

## 2011-12-23 MED ORDER — BIOTIN 1000 MCG PO TABS: 1000.0000 ug | ORAL_TABLET | Freq: Every day | ORAL | Status: DC

## 2011-12-23 MED ORDER — POTASSIUM CHLORIDE CRYS ER 20 MEQ PO TBCR
20.0000 meq | EXTENDED_RELEASE_TABLET | Freq: Every day | ORAL | Status: DC
  Administered 2011-12-24 – 2011-12-25 (×2): 20 meq via ORAL
  Filled 2011-12-23 (×2): qty 1

## 2011-12-23 MED ORDER — CITALOPRAM HYDROBROMIDE 20 MG PO TABS
20.0000 mg | ORAL_TABLET | Freq: Every day | ORAL | Status: DC
  Administered 2011-12-24 – 2011-12-25 (×2): 20 mg via ORAL
  Filled 2011-12-23 (×2): qty 1

## 2011-12-23 NOTE — ED Provider Notes (Signed)
History     CSN: [id number]  Arrival date & time 12/23/11  1816   First MD Initiated Contact with Patient 12/23/11 1820      Chief Complaint  Patient presents with  . Code Stroke    (Consider location/radiation/quality/duration/timing/severity/associated sxs/prior treatment) Patient is a 78 y.o. female presenting with neurologic complaint. The history is provided by the EMS personnel. The history is limited by the condition of the patient.  Neurologic Problem The primary symptoms include headaches, focal weakness and speech change. The symptoms began 1 to 2 hours ago. The symptoms are unchanged. The neurological symptoms are focal. Context: while at rest.  The headache is associated with weakness.  Region/motion of weakness: Left arm. There is impairment of the following actions: articulating words and holding things.  Additional symptoms include weakness. Medical issues also include cerebral vascular accident and hypertension.    Past Medical History  Diagnosis Date  . Polymyalgia rheumatica   . Stroke   . Hypertension   . Hyperlipidemia   . Arrhythmia     Atrial fibrillation  . Coronary artery disease      nonobstructive coronary artery diseas coronary artery disease by. Preserved LV function.   Marland Kitchen Permanent atrial fibrillation     On Coumadin.  Marland Kitchen Cough     Upper airway cough syndrome tolerating ACE inhibitor.  . Carotid bruit      no significant disease by carotid Dopplers  . Abnormal EKG      no coronary artery disease by catheterization and no change from prior tracing     Past Surgical History  Procedure Date  . Total abdominal hysterectomy   . Appendectomy     Family History  Problem Relation Age of Onset  . Coronary artery disease Other     History  Substance Use Topics  . Smoking status: Never Smoker   . Smokeless tobacco: Never Used  . Alcohol Use: No    OB History    Grav Para Term Preterm Abortions TAB SAB Ect Mult Living                   Review of Systems  Unable to perform ROS: Other  Cardiovascular: Negative for chest pain.  Neurological: Positive for speech change, focal weakness, weakness and headaches.    Allergies  Sulfonamide derivatives  Home Medications   Current Outpatient Rx  Name Route Sig Dispense Refill  . ASPIRIN EC 81 MG PO TBEC Oral Take 81 mg by mouth 3 (three) times a week.    . ATENOLOL 50 MG PO TABS Oral Take 50 mg by mouth daily.      . ATORVASTATIN CALCIUM 20 MG PO TABS Oral Take 20 mg by mouth daily.      Marland Kitchen BIOTIN 1000 MCG PO TABS Oral Take 1,000 mcg by mouth daily.    Marland Kitchen CALCIUM CARBONATE-VITAMIN D 600-400 MG-UNIT PO TABS Oral Take 1 tablet by mouth daily.      Marland Kitchen CITALOPRAM HYDROBROMIDE 20 MG PO TABS Oral Take 20 mg by mouth daily.    Marland Kitchen DILTIAZEM HCL ER 240 MG PO CP24 Oral Take 240 mg by mouth daily.      . FUROSEMIDE 20 MG PO TABS Oral Take 20 mg by mouth daily.    Marland Kitchen LOSARTAN POTASSIUM-HCTZ 100-12.5 MG PO TABS Oral Take 1 tablet by mouth daily.    Marland Kitchen FISH OIL 600 MG PO CAPS Oral Take 1 capsule by mouth daily.    Marland Kitchen PREDNISONE 1 MG PO  TBEC Oral Take 2 tablets by mouth daily.    . WARFARIN SODIUM 4 MG PO TABS Oral Take 1 tablet (4 mg total) by mouth as directed. 90 tablet 3    BP 171/62  Pulse 90  Temp(Src) 98.3 F (36.8 C) (Oral)  Resp 18  SpO2 100%  Physical Exam  Nursing note and vitals reviewed. Constitutional: She is oriented to person, place, and time. She appears well-developed and well-nourished.  HENT:  Head: Normocephalic and atraumatic.  Eyes: EOM are normal. Pupils are equal, round, and reactive to light.  Neck: Normal range of motion.  Cardiovascular: Normal rate, regular rhythm and normal heart sounds.   Pulmonary/Chest: Effort normal and breath sounds normal. No respiratory distress.  Abdominal: Soft. There is no tenderness.  Musculoskeletal: Normal range of motion.  Neurological: She is alert and oriented to person, place, and time. No cranial nerve deficit. GCS  eye subscore is 4. GCS verbal subscore is 4. GCS motor subscore is 6.       Trace weakness of LUE with holding arms extended   Skin: Skin is warm and dry.  Psychiatric: She has a normal mood and affect.    ED Course  Procedures (including critical care time)  Labs Reviewed  CBC - Abnormal; Notable for the following:    WBC 13.3 (*)    Platelets 561 (*)    All other components within normal limits  DIFFERENTIAL - Abnormal; Notable for the following:    Neutro Abs 9.4 (*)    All other components within normal limits  COMPREHENSIVE METABOLIC PANEL - Abnormal; Notable for the following:    Sodium 134 (*)    CO2 17 (*)    Glucose, Bld 109 (*)    GFR calc non Af Amer 58 (*)    GFR calc Af Amer 68 (*)    All other components within normal limits  POCT I-STAT, CHEM 8 - Abnormal; Notable for the following:    Glucose, Bld 115 (*)    All other components within normal limits  PROTIME-INR  APTT  CK TOTAL AND CKMB  TROPONIN I  URINALYSIS, ROUTINE W REFLEX MICROSCOPIC   Dg Chest 2 View  12/23/2011  *RADIOLOGY REPORT*  Clinical Data: 78 year old female Code stroke.  Hypertension atrial fibrillation.  CHEST - 2 VIEW  Comparison: CT abdomen 12/21/2008.  Findings: Upright and lateral views of the chest.  Mild cardiomegaly. Other mediastinal contours are within normal limits. No pneumothorax, pulmonary edema or consolidation.  Small left pleural effusion cannot be excluded.  No other confluent pulmonary opacity. No acute osseous abnormality identified.  IMPRESSION: Cardiomegaly.  Small left pleural effusion suspected.  Original Report Authenticated By: Harley Hallmark, M.D.   Ct Head Wo Contrast  12/23/2011  *RADIOLOGY REPORT*  Clinical Data: Code stroke.  Left-sided weakness.  Headache.  CT HEAD WITHOUT CONTRAST  Technique:  Contiguous axial images were obtained from the base of the skull through the vertex without contrast.  Comparison: None.  Findings: The brain stem, cerebellum, cerebral  peduncles, and thalami appear intact.  The left MCA distribution encephalomalacia is probably from an old stroke, although subacute component is difficult to totally exclude the degree of white matter involvement.  No right-sided stroke is observed.  No intracranial hemorrhage or mass lesion is identified. No hydrocephalus or mass lesion.  IMPRESSION:  1.  The left-sided MCA distribution infarct has imaging characteristics suggesting that it is chronic, although some of the white matter hypodensity in this area is nonspecific  in terms of age.  However, this would be an unlikely cause for left-sided weakness given the location.  No right-sided stroke is observed. No intracranial hemorrhage is evident.  Critical Value/emergent results were called by telephone to Dr. [place] Sheldon (who verbally acknowledged these results) at the time of interpretation on 12/23/2011 at 6:36 p.m.  Original Report Authenticated By: Dellia Cloud, M.D.     1. CVA (cerebral infarction)       MDM   Patient presents as a prehospital code stroke activation. Per EMS report she was normal at approximately 5 PM when her family noticed her to have difficulty speaking. She also had left-sided weakness. On my evaluation the patient does not have any facial droop. She does have difficulty speaking and repeating words. She does have some left greater than right weakness. CT with old left-sided infarct. No acute bleed or acute infarction noted.   Patient seen and evaluated by Dr. Roseanne Reno neurology. He felt patient stable for medical admission and further stroke workup. No further history from the family indicates that she recently had a large abdominal wall bleed that required blood transfusion. She's been off her Coumadin since that point.  Hospitalist consulted for admission.   Mild leukocytosis noted but patient is on steroids, possibly related to this. A chest x-ray and urine without signs of infection.     Donnamarie Poag, MD 12/23/11 2133

## 2011-12-23 NOTE — Code Documentation (Signed)
78 year old presented to ED via EMS as Code Stroke.  Husband states he noticed some word finding difficulties at about 1300 today - states his wife laid down in recliner about 1200 and was normal then.  Woke at 1300 and he noticed speech change.  He stated about 1500 it became worse and he called his daughter.  Code stroke called at 1800.  To ED at 1817.  EDP exam at 1817.  Stroke team arrival at 1817.  LSW 1200.  Patient to CT scan at 1821.  Patient was very upset and screaming of headache on arrival to ED.  Initial NIHSS was 10 - see flowsheet for details.  After CT scan patient became more calm - relaxed - speaking with family. Sx better - NIHSS 03 - see flowsheet for details. Family reports patient got 5 units of blood last weekend for bleed into hip muscle.  Recent surgery on left ankle for fracture.  Patient with hx afib - off Coumadin secondary to bleed.  Code stroke cancelled by Dr. Roseanne Reno at [phone].

## 2011-12-23 NOTE — ED Provider Notes (Signed)
I saw and evaluated the patient, reviewed the resident's note and I agree with the findings and plan.  Pt with new stroke symptoms on the L arm and leg, associated with headache and improved enroute and in the ED. She has evidence of old stroke affecting her R side on CT, but no acute findings. She was seen by Neurology/Stroke Team, please see their NIH Stroke Scale. Pt to be admitted to hospitalist service for further eval.   Clary Boulais B. Bernette Mayers, MD 12/23/11 2127

## 2011-12-23 NOTE — Consult Note (Signed)
Chief Complaint:   HPI: [name] [name] is an 78 y.o. female transient dysarthria and left-sided weakness as well as mental status changes. Patient was last seen normal at 1200 today. She arrived in the emergency room she had dysarthric speech and was unable to move left extremities. Patient is also confused and agitated. She complained of a headache. 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 No acute changes were noted. Patient has been on aspirin 81 mg per day. She has a history of atrial fibrillation and was on Coumadin until approximately one week ago when she developed a large left lower abdominal hematoma requiring blood transfusion for management of acute anemia. NIH stroke score initially was 10. Repeat NIH stroke score was 2. Patient was not a candidate for intravenous thrombolytic therapy with TPA. She status post foot surgery 2 weeks ago for fracture suffered during a fall and has a brace on her left leg and foot.  LSN: 1200 on 12/23/2011 tPA Given: No: Beyond time window and rapid improvement in deficits MRankin: 0  Past Medical History  Diagnosis Date  . Polymyalgia rheumatica   . Stroke   . Hypertension   . Hyperlipidemia   . Arrhythmia     Atrial fibrillation  . Coronary artery disease      nonobstructive coronary artery diseas coronary artery disease by. Preserved LV function.   Marland Kitchen Permanent atrial fibrillation     On Coumadin.  Marland Kitchen Cough     Upper airway cough syndrome tolerating ACE inhibitor.  . Carotid bruit      no significant disease by carotid Dopplers  . Abnormal EKG      no coronary artery disease by catheterization and no change from prior tracing     Family History  Problem Relation Age of Onset  . Coronary artery disease Other      Medications: Prior to Admission:  Aspirin 81 mg per day Atenolol 50 mg per day Lipitor 20 mg per day biotin 1000 mcg per day Calcium with vitamin D one tablet per day Celexa 20  mg per day Cardizem CD. 40 mg per day Cozaar 100 mg per day Multivitamin 1 per day Omega-3 fatty acids 600 mg per day Potassium chloride 20 mEq per day Prednisone 4 mg per day  Physical Examination: Blood pressure 171/62, pulse 90, temperature 98.3 F (36.8 C), temperature source Oral, resp. rate 18, SpO2 100.00%.  Neurologic Examination: Mental Status: Alert, oriented, thought content appropriate.  Speech fluent without evidence of aphasia. Able to follow commands without difficulty. Cranial Nerves: II-Visual fields were normal. III/IV/VI-Pupils were equal and reacted. Extraocular movements were full and conjugate.    V/VII-no facial numbness and no facial weakness. VIII-normal. X-normal speech and symmetrical palatal movement. XII-midline tongue extension Motor: 5/5 bilaterally with normal tone and bulk Sensory: Normal throughout. Deep Tendon Reflexes: 1+ and symmetric. Plantars: Flexor on the right; brace on left foot and leg. Cerebellar: Normal finger-to-nose testing. Carotid auscultation: Normal   Ct Head Wo Contrast  12/23/2011  *RADIOLOGY REPORT*  Clinical Data: Code stroke.  Left-sided weakness.  Headache.  CT HEAD WITHOUT CONTRAST  Technique:  Contiguous axial images were obtained from the base of the skull through the vertex without contrast.  Comparison: None.  Findings: The brain stem, cerebellum, cerebral peduncles, and thalami appear intact.  The left MCA distribution encephalomalacia is probably from an old stroke, although subacute component is difficult to totally exclude the degree of white matter involvement.  No right-sided stroke is observed.  No intracranial hemorrhage or mass lesion is identified. No hydrocephalus or mass lesion.  IMPRESSION:  1.  The left-sided MCA distribution infarct has imaging characteristics suggesting that it is chronic, although some of the white matter hypodensity in this area is nonspecific in terms of age.  However, this would be an  unlikely cause for left-sided weakness given the location.  No right-sided stroke is observed. No intracranial hemorrhage is evident.  Critical Value/emergent results were called by telephone to Dr. [place] Sheldon (who verbally acknowledged these results) at the time of interpretation on 12/23/2011 at 6:36 p.m.  Original Report Authenticated By: Dellia Cloud, M.D.    Assessment: 78 y.o. female with probable right MCA territory TIA, likely related to history of atrial fibrillation with embolic phenomenon. Recurrent small subcortical stroke cannot be ruled out at this point, however.  Stroke Risk Factors - atrial fibrillation, hyperlipidemia and hypertension  Plan: 1. HgbA1c, fasting lipid panel 2. MRI, MRA  of the brain without contrast 3. PT consult 4. Echocardiogram 5. Carotid dopplers 6. Prophylactic therapy-Antiplatelet med: Aspirin 81 mg per day 7. Risk factor modification 8. Telemetry monitoring  C.R. Roseanne Reno, MD [facility] [phone]  12/23/2011, 7:47 PM

## 2011-12-23 NOTE — ED Notes (Signed)
Per EMS: Patient from home, sudden onset of left sided weakness and headache at 1630 witnessed by family.  Patient has weakness to right side from a stroke 9 years ago. Patient reporting headache, but has weakness to left side.

## 2011-12-23 NOTE — H&P (Signed)
[name] [name] is an 78 y.o. female.   PCP - Dr.Terry Reuel Boom at [place]. Cardiologist - [facility]. Chief Complaint: Difficulty speaking and confusion with left-sided weakness. HPI: 78 year-old female with known history of hypertension, atrial fibrillation, polymyalgia rheumatica and previous history of stroke which affected the right side around noon time patient's husband found patient was finding it difficult to bring out words. This progressively got worse and and patient also got confused. Patient's husband called her daughter and eventually called the EMS by 4 PM. When EMS arrived patient was found to have mild left-sided weakness. Patient was brought as a code stroke. CT of the head was done which was negative for anything acute. Neurologist Dr. Roseanne Reno saw the patient and felt patient was not a candidate for TPA and requested admission for further observation and management. Patient on December 02, 2011 last month had surgery for left ankle fracture. After which patient was discharged to rehabilitation and was found to have left-sided abdominal hematoma. Patient was transferred back to [facility] and Coumadin was discontinued and received 4 units of packed but self and one unit of FFP. Since then patient has been off Coumadin. Patient at this time when I examined still has difficulty being out words though patient's husband feels she has greatly improved. She still has minimal left upper extremity weakness. Denies any chest pain, shortness of breath, palpitations, nausea, vomiting, abdominal pain, diarrhea or headache. Denies any difficulty swallowing or any blurred vision. Patient did have some headache initially which has resolved.   Past Medical History  Diagnosis Date  . Polymyalgia rheumatica   . Stroke   . Hypertension   . Hyperlipidemia   . Arrhythmia     Atrial fibrillation  . Coronary artery disease      nonobstructive coronary artery diseas coronary artery disease by.  Preserved LV function.   Marland Kitchen Permanent atrial fibrillation     On Coumadin.  Marland Kitchen Cough     Upper airway cough syndrome tolerating ACE inhibitor.  . Carotid bruit      no significant disease by carotid Dopplers  . Abnormal EKG      no coronary artery disease by catheterization and no change from prior tracing     Past Surgical History  Procedure Date  . Total abdominal hysterectomy   . Appendectomy     Family History  Problem Relation Age of Onset  . Coronary artery disease Other    Social History:  reports that she has never smoked. She has never used smokeless tobacco. She reports that she does not drink alcohol. Her drug history not on file.  Allergies:  Allergies  Allergen Reactions  . Sulfonamide Derivatives      (Not in a hospital admission)  Results for orders placed during the hospital encounter of 12/23/11 (from the past 48 hour(s))  PROTIME-INR     Status: Normal   Collection Time   12/23/11  6:20 PM      Component Value Range Comment   Prothrombin Time 13.6  11.6 - 15.2 (seconds)    INR 1.02  0.00 - 1.49    APTT     Status: Normal   Collection Time   12/23/11  6:20 PM      Component Value Range Comment   aPTT 32  24 - 37 (seconds)   CBC     Status: Abnormal   Collection Time   12/23/11  6:20 PM      Component Value Range Comment  WBC 13.3 (*) 4.0 - 10.5 (K/uL)    RBC 4.28  3.87 - 5.11 (MIL/uL)    Hemoglobin 12.9  12.0 - 15.0 (g/dL)    HCT 16.1  09.6 - 04.5 (%)    MCV 87.6  78.0 - 100.0 (fL)    MCH 30.1  26.0 - 34.0 (pg)    MCHC 34.4  30.0 - 36.0 (g/dL)    RDW 40.9  81.1 - 91.4 (%)    Platelets 561 (*) 150 - 400 (K/uL)   DIFFERENTIAL     Status: Abnormal   Collection Time   12/23/11  6:20 PM      Component Value Range Comment   Neutrophils Relative 71  43 - 77 (%)    Neutro Abs 9.4 (*) 1.7 - 7.7 (K/uL)    Lymphocytes Relative 22  12 - 46 (%)    Lymphs Abs 3.0  0.7 - 4.0 (K/uL)    Monocytes Relative 6  3 - 12 (%)    Monocytes Absolute 0.8  0.1 - 1.0  (K/uL)    Eosinophils Relative 1  0 - 5 (%)    Eosinophils Absolute 0.1  0.0 - 0.7 (K/uL)    Basophils Relative 0  0 - 1 (%)    Basophils Absolute 0.0  0.0 - 0.1 (K/uL)   COMPREHENSIVE METABOLIC PANEL     Status: Abnormal   Collection Time   12/23/11  6:20 PM      Component Value Range Comment   Sodium 134 (*) 135 - 145 (mEq/L)    Potassium 4.2  3.5 - 5.1 (mEq/L)    Chloride 103  96 - 112 (mEq/L)    CO2 17 (*) 19 - 32 (mEq/L)    Glucose, Bld 109 (*) 70 - 99 (mg/dL)    BUN 17  6 - 23 (mg/dL)    Creatinine, Ser 7.82  0.50 - 1.10 (mg/dL)    Calcium 9.7  8.4 - 10.5 (mg/dL)    Total Protein 7.2  6.0 - 8.3 (g/dL)    Albumin 3.6  3.5 - 5.2 (g/dL)    AST 21  0 - 37 (U/L)    ALT 16  0 - 35 (U/L)    Alkaline Phosphatase 96  39 - 117 (U/L)    Total Bilirubin 0.8  0.3 - 1.2 (mg/dL)    GFR calc non Af Amer 58 (*) >90 (mL/min)    GFR calc Af Amer 68 (*) >90 (mL/min)   CK TOTAL AND CKMB     Status: Normal   Collection Time   12/23/11  6:21 PM      Component Value Range Comment   Total CK 62  7 - 177 (U/L)    CK, MB 1.3  0.3 - 4.0 (ng/mL)    Relative Index RELATIVE INDEX IS INVALID  0.0 - 2.5    TROPONIN I     Status: Normal   Collection Time   12/23/11  6:21 PM      Component Value Range Comment   Troponin I <0.30  <0.30 (ng/mL)   POCT I-STAT, CHEM 8     Status: Abnormal   Collection Time   12/23/11  6:28 PM      Component Value Range Comment   Sodium 137  135 - 145 (mEq/L)    Potassium 4.0  3.5 - 5.1 (mEq/L)    Chloride 109  96 - 112 (mEq/L)    BUN 17  6 - 23 (mg/dL)    Creatinine, Ser  1.00  0.50 - 1.10 (mg/dL)    Glucose, Bld 161 (*) 70 - 99 (mg/dL)    Calcium, Ion 0.96  1.12 - 1.32 (mmol/L)    TCO2 20  0 - 100 (mmol/L)    Hemoglobin 12.9  12.0 - 15.0 (g/dL)    HCT 04.5  40.9 - 81.1 (%)   URINALYSIS, ROUTINE W REFLEX MICROSCOPIC     Status: Normal   Collection Time   12/23/11  8:34 PM      Component Value Range Comment   Color, Urine YELLOW  YELLOW     APPearance CLEAR  CLEAR      Specific Gravity, Urine 1.011  1.005 - 1.030     pH 7.5  5.0 - 8.0     Glucose, UA NEGATIVE  NEGATIVE (mg/dL)    Hgb urine dipstick NEGATIVE  NEGATIVE     Bilirubin Urine NEGATIVE  NEGATIVE     Ketones, ur NEGATIVE  NEGATIVE (mg/dL)    Protein, ur NEGATIVE  NEGATIVE (mg/dL)    Urobilinogen, UA 0.2  0.0 - 1.0 (mg/dL)    Nitrite NEGATIVE  NEGATIVE     Leukocytes, UA NEGATIVE  NEGATIVE  MICROSCOPIC NOT DONE ON URINES WITH NEGATIVE PROTEIN, BLOOD, LEUKOCYTES, NITRITE, OR GLUCOSE <1000 mg/dL.   Dg Chest 2 View  12/23/2011  *RADIOLOGY REPORT*  Clinical Data: 78 year old female Code stroke.  Hypertension atrial fibrillation.  CHEST - 2 VIEW  Comparison: CT abdomen 12/21/2008.  Findings: Upright and lateral views of the chest.  Mild cardiomegaly. Other mediastinal contours are within normal limits. No pneumothorax, pulmonary edema or consolidation.  Small left pleural effusion cannot be excluded.  No other confluent pulmonary opacity. No acute osseous abnormality identified.  IMPRESSION: Cardiomegaly.  Small left pleural effusion suspected.  Original Report Authenticated By: Harley Hallmark, M.D.   Ct Head Wo Contrast  12/23/2011  *RADIOLOGY REPORT*  Clinical Data: Code stroke.  Left-sided weakness.  Headache.  CT HEAD WITHOUT CONTRAST  Technique:  Contiguous axial images were obtained from the base of the skull through the vertex without contrast.  Comparison: None.  Findings: The brain stem, cerebellum, cerebral peduncles, and thalami appear intact.  The left MCA distribution encephalomalacia is probably from an old stroke, although subacute component is difficult to totally exclude the degree of white matter involvement.  No right-sided stroke is observed.  No intracranial hemorrhage or mass lesion is identified. No hydrocephalus or mass lesion.  IMPRESSION:  1.  The left-sided MCA distribution infarct has imaging characteristics suggesting that it is chronic, although some of the white matter hypodensity in  this area is nonspecific in terms of age.  However, this would be an unlikely cause for left-sided weakness given the location.  No right-sided stroke is observed. No intracranial hemorrhage is evident.  Critical Value/emergent results were called by telephone to Dr. [place] Sheldon (who verbally acknowledged these results) at the time of interpretation on 12/23/2011 at 6:36 p.m.  Original Report Authenticated By: Dellia Cloud, M.D.    Review of Systems  Constitutional: Negative.   HENT: Negative.   Eyes: Negative.   Respiratory: Negative.   Cardiovascular: Negative.   Gastrointestinal: Negative.   Genitourinary: Negative.   Musculoskeletal: Negative.   Skin: Negative.   Neurological:       Difficulty speaking and left sided weakness with confusion.  Endo/Heme/Allergies: Negative.   Psychiatric/Behavioral: Negative.     Blood pressure 166/82, pulse 81, temperature 98.3 F (36.8 C), temperature source Oral, resp. rate 15, SpO2  100.00%. Physical Exam  Constitutional: She is oriented to person, place, and time. She appears well-developed and well-nourished. No distress.  HENT:  Head: Normocephalic and atraumatic.  Right Ear: External ear normal.  Left Ear: External ear normal.  Nose: Nose normal.  Mouth/Throat: Oropharynx is clear and moist. No oropharyngeal exudate.  Eyes: Conjunctivae are normal. Pupils are equal, round, and reactive to light. Right eye exhibits no discharge. Left eye exhibits no discharge. No scleral icterus.  Neck: Normal range of motion. Neck supple.  Cardiovascular: Normal rate and regular rhythm.   Respiratory: Effort normal and breath sounds normal. No respiratory distress. She has no wheezes. She has no rales.  GI: Soft. Bowel sounds are normal. She exhibits no distension. There is no tenderness. There is no rebound.  Musculoskeletal: She exhibits no edema and no tenderness.       Left lower extremity in brace from recent surgery.  Neurological: She is  alert and oriented to person, place, and time.       Has difficulty bringing out words. No obvious facial asymmetry. Tongue is midline. Mild left upper extremity weakness. Rest of the extremity 5/5. No pronator drift.  Skin: Skin is warm and dry. No rash noted. She is not diaphoretic. No erythema.  Psychiatric: Her behavior is normal.     Assessment/Plan #1. CVA  - as per the neurologist now to continue with aspirin. Get MRI/MRA brain, carotid Doppler, 2-D echo. Patient will be on neuro checks and get swallow evaluation. #2. Atrial fibrillation rate control not on Coumadin secondary to recent left-sided abdominal hematoma  - continue present home medications for rate control.  #3. Mild metabolic acidosis - may improve with hydration. #4. Leukocytosis - patient is afebrile. Chest x-ray and UA does not suggest any infective process. Closely follow CBC. May be from chronic steroid use for her polymyalgia rheumatica. #5. History of polymyalgia rheumatica - continue steroids. #6. Hypertension - continue present medications. #7. Recent left ankle surgery after a mechanical fall.  CODE STATUS - full code.  Eduard Clos. 12/23/2011, 9:03 PM

## 2011-12-23 NOTE — Progress Notes (Signed)
Dr Toniann Fail your order for Biotin has been noted.  Pharmacy is not ordering due to [employer] policy on herbal meds Thank you Jojn Primitivo Gauze RPh

## 2011-12-24 ENCOUNTER — Inpatient Hospital Stay (HOSPITAL_COMMUNITY): Payer: Medicare Other

## 2011-12-24 DIAGNOSIS — I059 Rheumatic mitral valve disease, unspecified: Secondary | ICD-10-CM | POA: Diagnosis not present

## 2011-12-24 DIAGNOSIS — I6350 Cerebral infarction due to unspecified occlusion or stenosis of unspecified cerebral artery: Secondary | ICD-10-CM | POA: Diagnosis not present

## 2011-12-24 DIAGNOSIS — I69998 Other sequelae following unspecified cerebrovascular disease: Secondary | ICD-10-CM | POA: Diagnosis not present

## 2011-12-24 DIAGNOSIS — I4891 Unspecified atrial fibrillation: Secondary | ICD-10-CM

## 2011-12-24 DIAGNOSIS — R4789 Other speech disturbances: Secondary | ICD-10-CM | POA: Diagnosis not present

## 2011-12-24 DIAGNOSIS — G459 Transient cerebral ischemic attack, unspecified: Secondary | ICD-10-CM

## 2011-12-24 LAB — CBC
Hemoglobin: 11.4 g/dL — ABNORMAL LOW (ref 12.0–15.0)
MCH: 29.5 pg (ref 26.0–34.0)
MCHC: 33.7 g/dL (ref 30.0–36.0)
MCV: 87.3 fL (ref 78.0–100.0)
RBC: 3.87 MIL/uL (ref 3.87–5.11)

## 2011-12-24 LAB — COMPREHENSIVE METABOLIC PANEL
ALT: 13 U/L (ref 0–35)
AST: 16 U/L (ref 0–37)
Albumin: 3.3 g/dL — ABNORMAL LOW (ref 3.5–5.2)
CO2: 21 mEq/L (ref 19–32)
Calcium: 8.9 mg/dL (ref 8.4–10.5)
Chloride: 105 mEq/L (ref 96–112)
Creatinine, Ser: 0.81 mg/dL (ref 0.50–1.10)
GFR calc non Af Amer: 67 mL/min — ABNORMAL LOW (ref >90)
Sodium: 135 mEq/L (ref 135–145)

## 2011-12-24 LAB — LIPID PANEL
Total CHOL/HDL Ratio: 5.3 RATIO
VLDL: 28 mg/dL (ref 0–40)

## 2011-12-24 MED ORDER — WARFARIN - PHARMACIST DOSING INPATIENT: Freq: Every day | Status: DC

## 2011-12-24 MED ORDER — ACETAMINOPHEN 325 MG PO TABS
650.0000 mg | ORAL_TABLET | Freq: Four times a day (QID) | ORAL | Status: DC | PRN
  Administered 2011-12-24 (×2): 650 mg via ORAL
  Filled 2011-12-24 (×2): qty 1

## 2011-12-24 MED ORDER — WARFARIN SODIUM 7.5 MG PO TABS
7.5000 mg | ORAL_TABLET | Freq: Once | ORAL | Status: AC
  Administered 2011-12-24: 7.5 mg via ORAL
  Filled 2011-12-24: qty 1

## 2011-12-24 MED ORDER — ASPIRIN 81 MG PO CHEW
81.0000 mg | CHEWABLE_TABLET | Freq: Every day | ORAL | Status: DC
  Administered 2011-12-25: 81 mg via ORAL
  Filled 2011-12-24: qty 1

## 2011-12-24 MED ORDER — ATORVASTATIN CALCIUM 20 MG PO TABS
20.0000 mg | ORAL_TABLET | Freq: Every day | ORAL | Status: DC
  Administered 2011-12-24: 20 mg via ORAL
  Filled 2011-12-24 (×2): qty 1

## 2011-12-24 MED ORDER — ATENOLOL 50 MG PO TABS
50.0000 mg | ORAL_TABLET | Freq: Every day | ORAL | Status: DC
  Administered 2011-12-24 (×2): 50 mg via ORAL
  Filled 2011-12-24 (×2): qty 1

## 2011-12-24 NOTE — Progress Notes (Signed)
OT Cancellation Note  Treatment cancelled today due to pt reports, she just got in bed,and is exhausted.  Pt. requesting to defer OT until tomorrow. Will re-attempt.  Boykin Reaper [phone] 12/24/2011, 4:09 PM

## 2011-12-24 NOTE — Progress Notes (Signed)
SLP Cancellation Note  Treatment cancelled today due to patient receiving procedure or test.  Will f/u to complete a cognitive-linguistic evaluation.  Myra Rude, M.S.,CCC-SLP Pager [phone] 12/24/2011, 11:11 AM

## 2011-12-24 NOTE — Progress Notes (Signed)
Utilization Review Completed.  [name] [name]  12/24/2011  

## 2011-12-24 NOTE — Progress Notes (Signed)
PT Cancellation Note  Treatment cancelled today due to per RN pt getting ready to leave for MRI.  Will try another time.    Sunny Schlein, [place] [phone] 12/24/2011, 10:24 AM

## 2011-12-24 NOTE — Progress Notes (Signed)
[facility] PROGRESS NOTE  [name] [name] ZOX:[id number] DOB: Oct 14, 1929 DOA: 12/23/2011 PCP: Donzetta Sprung, MD, MD  Assessment/Plan: Patient Active Hospital Problem List: CVA (05/11/2007): -NOt a candidate for TPA. -HgbA1c, fasting lipid panel pending. -MRI, MRA of the brain without contrast pending. -PT consult, OT consult, Speech consult pending -Echocardiogram pending -Carotid dopplers pending. -Prophylactic therapy-Antiplatelet med: Aspirin - dose 81 mg PO daily  -Cardiac Monitoring, no events  HYPERLIPIDEMIA (05/11/2007)  statins  HYPERtENSION (05/11/2007) Allow permissive HTN goal <180/100  Atrial fibrillation (05/11/2007)   Rate controled, ASA 325  Polymyalgia rheumatica (05/11/2007)   Steroids.    Code Status: Full code Family Communication: Son [phone] Disposition Plan: TBD  Lambert Keto, MD  [facility] Pager [phone]  If 7PM-7AM, please contact night-coverage [email or web address] Password [email or web address] 12/24/2011, 7:57 AM   LOS: 1 day    Subjective: No complains.  Objective: Filed Vitals:   12/23/11 2349 12/24/11 0201 12/24/11 0350 12/24/11 0546  BP: 165/81 170/68 168/70 162/75  Pulse: 73 48 54 70  Temp: 98.4 F (36.9 C) 98.5 F (36.9 C) 98.2 F (36.8 C) 98.6 F (37 C)  TempSrc: Oral Oral Oral Oral  Resp: 18 20 18 18   Height:      Weight:      SpO2: 97% 96% 94% 98%   No intake or output data in the 24 hours ending 12/24/11 0757  Exam:   General:  Alert and oriented x3  Cardiovascular: RRR no murmurs rub or gallops  Respiratory: good air movement CTA b/l   Data Reviewed: Basic Metabolic Panel:  Lab 12/24/11 6578 12/23/11 1828 12/23/11 1820  NA 135 137 134*  K 4.0 4.0 --  CL 105 109 103  CO2 21 -- 17*  GLUCOSE 106* 115* 109*  BUN 13 17 17   CREATININE 0.81 1.00 0.91  CALCIUM 8.9 -- 9.7  MG -- -- --  PHOS -- -- --   Liver Function Tests:  Lab 12/24/11 0545 12/23/11 1820  AST 16 21  ALT 13 16  ALKPHOS  89 96  BILITOT 0.8 0.8  PROT 6.4 7.2  ALBUMIN 3.3* 3.6   No results found for this basename: LIPASE:5,AMYLASE:5 in the last 168 hours No results found for this basename: AMMONIA:5 in the last 168 hours CBC:  Lab 12/24/11 0545 12/23/11 1828 12/23/11 1820  WBC 12.9* -- 13.3*  NEUTROABS -- -- 9.4*  HGB 11.4* 12.9 12.9  HCT 33.8* 38.0 37.5  MCV 87.3 -- 87.6  PLT 531* -- 561*   Cardiac Enzymes:  Lab 12/23/11 1821  CKTOTAL 62  CKMB 1.3  CKMBINDEX --  TROPONINI <0.30   BNP: No components found with this basename: POCBNP:5 CBG: No results found for this basename: GLUCAP:5 in the last 168 hours  No results found for this or any previous visit (from the past 240 hour(s)).   Studies: Dg Chest 2 View  12/23/2011  *RADIOLOGY REPORT*  Clinical Data: 78 year old female Code stroke.  Hypertension atrial fibrillation.  CHEST - 2 VIEW  Comparison: CT abdomen 12/21/2008.  Findings: Upright and lateral views of the chest.  Mild cardiomegaly. Other mediastinal contours are within normal limits. No pneumothorax, pulmonary edema or consolidation.  Small left pleural effusion cannot be excluded.  No other confluent pulmonary opacity. No acute osseous abnormality identified.  IMPRESSION: Cardiomegaly.  Small left pleural effusion suspected.  Original Report Authenticated By: Harley Hallmark, M.D.   Ct Head Wo Contrast  12/23/2011  *RADIOLOGY REPORT*  Clinical Data: Code stroke.  Left-sided  weakness.  Headache.  CT HEAD WITHOUT CONTRAST  Technique:  Contiguous axial images were obtained from the base of the skull through the vertex without contrast.  Comparison: None.  Findings: The brain stem, cerebellum, cerebral peduncles, and thalami appear intact.  The left MCA distribution encephalomalacia is probably from an old stroke, although subacute component is difficult to totally exclude the degree of white matter involvement.  No right-sided stroke is observed.  No intracranial hemorrhage or mass lesion is  identified. No hydrocephalus or mass lesion.  IMPRESSION:  1.  The left-sided MCA distribution infarct has imaging characteristics suggesting that it is chronic, although some of the white matter hypodensity in this area is nonspecific in terms of age.  However, this would be an unlikely cause for left-sided weakness given the location.  No right-sided stroke is observed. No intracranial hemorrhage is evident.  Critical Value/emergent results were called by telephone to Dr. [place] Sheldon (who verbally acknowledged these results) at the time of interpretation on 12/23/2011 at 6:36 p.m.  Original Report Authenticated By: Dellia Cloud, M.D.    Scheduled Meds:   . aspirin  300 mg Rectal Daily   Or  . aspirin  325 mg Oral Daily  . atenolol  50 mg Oral QHS  . calcium-vitamin D  1 tablet Oral Daily  . citalopram  20 mg Oral Daily  . diltiazem  240 mg Oral Daily  . enoxaparin  40 mg Subcutaneous Q24H  . losartan  100 mg Oral Daily  . omega-3 acid ethyl esters  1 capsule Oral Daily  . potassium chloride SA  20 mEq Oral Daily  . predniSONE  4 mg Oral Daily  . simvastatin  40 mg Oral Daily  . DISCONTD: atenolol  50 mg Oral Daily  . DISCONTD: Biotin  1,000 mcg Oral Daily   Continuous Infusions:   . sodium chloride 75 mL/hr at 12/23/11 2308

## 2011-12-24 NOTE — Progress Notes (Signed)
History: [name] [name] is an 78 y.o. female transient dysarthria and left-sided weakness as well as mental status changes. Patient was last seen normal at 1200 today. She arrived in the emergency room she had dysarthric speech, expressive aphasia and was unable to move left extremities. Patient is also confused and agitated. She complained of a headache. 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 No acute changes were noted. Patient has been on aspirin 81 mg per day. She has a history of atrial fibrillation and was on Coumadin.  Had been taken off Coumadin due to surgery.  Had been on lovenox when she developed large abdominal hematoma requiring transfusions.  Patient had been on Coumadin for many years without bleeding complications.  NIH stroke score initially was 10. Repeat NIH stroke score was 2. Patient was not a candidate for intravenous thrombolytic therapy with TPA. She status post foot surgery 2 weeks ago for fracture suffered during a fall and has a brace on her left leg and foot.   LSN: 1200 on 12/23/2011  tPA Given: No: Beyond time window and rapid improvement in deficits  MRankin: 0   Subjective: Speech and communication ability much improved.    Had been taken off Coumadin due to surgery.  Had been on lovenox when she developed large abdominal hematoma requiring transfusions.  Patient had been on Coumadin for many years without bleeding complications.    Large CVA years ago with residual Right sided weakness and sensory disturbance.    Family at bedside. Objective: BP 162/75  Pulse 70  Temp(Src) 98.6 F (37 C) (Oral)  Resp 18  Ht 5\' 7"  (1.702 m)  Wt 74.3 kg (163 lb 12.8 oz)  BMI 25.65 kg/m2  SpO2 98%  Medications: Scheduled:   . aspirin  300 mg Rectal Daily   Or  . aspirin  325 mg Oral Daily  . atenolol  50 mg Oral QHS  . atorvastatin  20 mg Oral q1800  . calcium-vitamin D  1 tablet Oral Daily  . citalopram  20 mg Oral  Daily  . diltiazem  240 mg Oral Daily  . enoxaparin  40 mg Subcutaneous Q24H  . losartan  100 mg Oral Daily  . omega-3 acid ethyl esters  1 capsule Oral Daily  . potassium chloride SA  20 mEq Oral Daily  . predniSONE  4 mg Oral Daily  . DISCONTD: atenolol  50 mg Oral Daily  . DISCONTD: Biotin  1,000 mcg Oral Daily  . DISCONTD: simvastatin  40 mg Oral Daily    Neurologic Exam: Mental Status: Alert, oriented, thought content appropriate.  Speech fluent without evidence of aphasia. Able to follow 3 step commands without difficulty. Mild paraphrasia. Repetition intact, #Animals 14/min.  Identifies objects correctly.  Cranial Nerves: II- Visual fields grossly intact.except mild peripheral vision loss due to old stroke. III/IV/VI-Extraocular movements intact.  Pupils reactive bilaterally. V/VII-Smile symmetric VIII-hearing grossly intact IX/X-normal gag XI-bilateral shoulder shrug XII-midline tongue extension Motor: 5/5 bilaterally with normal tone and bulk Sensory: Light touch intact throughout, bilaterally Deep Tendon Reflexes: 1+ and symmetric throughout Plantars: Flexor on the right; brace on left foot and leg. Cerebellar: Normal finger-to-nose, normal rapid alternating movements and normal heel-to-shin test.   Lab Results: Basic Metabolic Panel:  Lab 12/24/11 1610 12/23/11 1828 12/23/11 1820  NA 135 137 --  K 4.0 4.0 --  CL 105 109 --  CO2 21 -- 17*  GLUCOSE 106* 115* --  BUN 13  17 --  CREATININE 0.81 1.00 --  CALCIUM 8.9 -- 9.7  MG -- -- --  PHOS -- -- --   Liver Function Tests:  Lab 12/24/11 0545 12/23/11 1820  AST 16 21  ALT 13 16  ALKPHOS 89 96  BILITOT 0.8 0.8  PROT 6.4 7.2  ALBUMIN 3.3* 3.6   CBC:  Lab 12/24/11 0545 12/23/11 1828 12/23/11 1820  WBC 12.9* -- 13.3*  NEUTROABS -- -- 9.4*  HGB 11.4* 12.9 --  HCT 33.8* 38.0 --  MCV 87.3 -- 87.6  PLT 531* -- 561*   Cardiac Enzymes:  Lab 12/23/11 1821  CKTOTAL 62  CKMB 1.3  CKMBINDEX --  TROPONINI  <0.30   Fasting Lipid Panel:  Lab 12/24/11 0545  CHOL 190  HDL 36*  LDLCALC 126*  TRIG 139  CHOLHDL 5.3  LDLDIRECT --   Thyroid Function Tests:  Lab 12/24/11 0545  TSH 1.055  T4TOTAL --  FREET4 --  T3FREE --  THYROIDAB --   Coagulation:  Lab 12/23/11 1820  LABPROT 13.6  INR 1.02   Urinalysis:  Lab 12/23/11 2034  COLORURINE YELLOW  LABSPEC 1.011  PHURINE 7.5  GLUCOSEU NEGATIVE  HGBUR NEGATIVE  BILIRUBINUR NEGATIVE  KETONESUR NEGATIVE  PROTEINUR NEGATIVE  UROBILINOGEN 0.2  NITRITE NEGATIVE  LEUKOCYTESUR NEGATIVE   Study Results:  12/23/2011 CHEST - 2 VIEW Findings: Upright and lateral views of the chest.  Mild cardiomegaly. Other mediastinal contours are within normal limits. No pneumothorax, pulmonary edema or consolidation.  Small left pleural effusion cannot be excluded.  No other confluent pulmonary opacity. No acute osseous abnormality identified.  IMPRESSION: Cardiomegaly.  Small left pleural effusion suspected.   H.LEE HALL III, M.D.   12/23/2011   CT HEAD WITHOUT CONTRAST   Findings: The brain stem, cerebellum, cerebral peduncles, and thalami appear intact.  The left MCA distribution encephalomalacia is probably from an old stroke, although subacute component is difficult to totally exclude the degree of white matter involvement.  No right-sided stroke is observed.  No intracranial hemorrhage or mass lesion is identified. No hydrocephalus or mass lesion.  IMPRESSION:  1.  The left-sided MCA distribution infarct has imaging characteristics suggesting that it is chronic, although some of the white matter hypodensity in this area is nonspecific in terms of age.  However, this would be an unlikely cause for left-sided weakness given the location.  No right-sided stroke is observed. No intracranial hemorrhage is evident.  Dellia Cloud, M.D.   12/24/2011   MRI HEAD WITHOUT CONTRAST  Findings:  Remote left frontal periopercular/parietal lobe infarct with  encephalomalacia.  Acute non hemorrhagic infarct posterior left periopercular/sub insular region.  Small acute infarct left periatrial region.  No intracranial hemorrhage.  Small vessel disease type changes.  Global atrophy without hydrocephalus.  Partially empty sella incidentally noted.  Abnormal intracranial vasculature as detailed below.  No intracranial mass lesion detected on this unenhanced exam.  Minimal to mild ethmoid sinus air cells and maxillary sinus mucosal thickening.  IMPRESSION: Remote left frontal periopercular/parietal lobe infarct with encephalomalacia.  Acute non hemorrhagic infarct posterior left periopercular/sub insular region.  Small acute infarct left periatrial region.  Fuller Canada, M.D  12/24/11 MRA HEAD WITHOUT CONTRAST  Findings: Mild ectasia of the petrous segment of the right internal carotid artery.  Ectasia of the cavernous segment of the right internal carotid artery.  This probably is related to atherosclerotic type changes and mild motion artifact rather than blister type aneurysm.  Decreased number visualized the left  middle cerebral artery branch vessels consistent with the patient's acute and chronic infarct in this distribution.  Middle cerebral artery mild to moderate branch vessel irregularity bilaterally.  No high-grade stenosis of the left carotid terminus, M1 segment or bifurcation of the left middle cerebral artery.  Mild bulge superior aspect of the M1 segment of the right middle cerebral artery appears to be origin of a vessel rather than aneurysm.  Right vertebral artery is occluded.  Irregularity with mild narrowing distal left vertebral artery.  Nonvisualization PICAs.  Two AICAs noted bilaterally.  Mild to slightly moderate narrowing of the mid to distal aspect of the basilar artery.  Basilar artery has a slightly bulbous appearance from which vessels arise without discrete aneurysm.  Fetal type origin of the right posterior cerebral artery with diminutive  size P1 segment right posterior cerebral artery.  Mild to moderate narrowing involving portions of the superior cerebellar arteries and left posterior cerebral artery with poor delineation of the right posterior cerebral artery branch vessels.  IMPRESSION: Decreased number visualized the left middle cerebral artery branch vessels consistent with the patient's acute and chronic infarct in this distribution.  Intracranial atherosclerotic type changes otherwise as noted above including occluded right vertebral artery. Fuller Canada, M.D.   Therapies: pending  Assessment/Plan: 78 y.o. female with left insular CVA and decreased number visualized the left middle cerebral artery branch vessels consistent with the patient's acute and chronic infarct in this distribution; embolic phenomenon.   Patient has chronic afib and had been on Coumadin therapy without complication until it was held due to left leg surgery.  The patient developed blood loss anemia due to lovenox injections post op.  LDL 126, not at goal <100.  Stroke Risk Factors - atrial fibrillation, hyperlipidemia and hypertension, prior CVA  Plan:  1. HgbA1c   2. PT consult  3. Echocardiogram  4. Carotid dopplers  5. Prophylactic therapy-Continue baby ASA and start Coumadin.   Once INR therapeutic, D/C ASA.  Has to be on DVT prohylaxis lovenox, as the can not use SCD's due to surgical dressing on left leg.  Monitor CBC. 6. Risk factor modification; increase lipitor to 40 mg. 7. Telemetry monitoring  8. Restart coumadin per pharmacy.      LOS: 1 day   Jana Hakim [facility] [phone] 12/24/2011  12:21 PM  Patient has been seen and examined by Dr. Pearlean Brownie who agrees with above.and has examined patient, and developed plan of care

## 2011-12-24 NOTE — Progress Notes (Signed)
  Echocardiogram 2D Echocardiogram has been performed.  [name] [name] [facility] 12/24/2011, 10:09 AM

## 2011-12-24 NOTE — Progress Notes (Signed)
ANTICOAGULATION CONSULT NOTE - Initial Consult  Pharmacy Consult for Coumadin Indication: Afib + CVA  Allergies  Allergen Reactions  . Sulfonamide Derivatives     Patient Measurements: Height: 5\' 7"  (170.2 cm) Weight: 163 lb 12.8 oz (74.3 kg) IBW/kg (Calculated) : 61.6  Heparin Dosing Weight: 74.3 kg  Vital Signs: Temp: 98.6 F (37 C) (05/07 0546) Temp src: Oral (05/07 0546) BP: 162/75 mmHg (05/07 0546) Pulse Rate: 70  (05/07 0546)  Labs:  Basename 12/24/11 0545 12/23/11 1828 12/23/11 1821 12/23/11 1820  HGB 11.4* 12.9 -- --  HCT 33.8* 38.0 -- 37.5  PLT 531* -- -- 561*  APTT -- -- -- 32  LABPROT -- -- -- 13.6  INR -- -- -- 1.02  HEPARINUNFRC -- -- -- --  CREATININE 0.81 1.00 -- 0.91  CKTOTAL -- -- 62 --  CKMB -- -- 1.3 --  TROPONINI -- -- <0.30 --    Estimated Creatinine Clearance: 59.3 ml/min (by C-G formula based on Cr of 0.81).   Medical History: Past Medical History  Diagnosis Date  . Polymyalgia rheumatica   . Stroke   . Hypertension   . Hyperlipidemia   . Arrhythmia     Atrial fibrillation  . Coronary artery disease      nonobstructive coronary artery diseas coronary artery disease by. Preserved LV function.   Marland Kitchen Permanent atrial fibrillation     On Coumadin.  Marland Kitchen Cough     Upper airway cough syndrome tolerating ACE inhibitor.  . Carotid bruit      no significant disease by carotid Dopplers  . Abnormal EKG      no coronary artery disease by catheterization and no change from prior tracing     Medications:  Prescriptions prior to admission  Medication Sig Dispense Refill  . aspirin EC 81 MG tablet Take 81 mg by mouth daily.       Marland Kitchen atenolol (TENORMIN) 50 MG tablet Take 50 mg by mouth daily.        Marland Kitchen atorvastatin (LIPITOR) 20 MG tablet Take 20 mg by mouth daily.      . Biotin 1000 MCG tablet Take 1,000 mcg by mouth daily.      . Calcium Carbonate-Vitamin D (CALTRATE 600+D) 600-400 MG-UNIT per tablet Take 1 tablet by mouth daily.        .  citalopram (CELEXA) 20 MG tablet Take 20 mg by mouth daily.      Marland Kitchen diltiazem (CARDIZEM CD) 240 MG 24 hr capsule Take 240 mg by mouth daily.      Marland Kitchen losartan (COZAAR) 100 MG tablet Take 100 mg by mouth daily.      . Multiple Vitamin (MULITIVITAMIN WITH MINERALS) TABS Take 1 tablet by mouth daily.      . Omega-3 Fatty Acids (FISH OIL) 600 MG CAPS Take 1 capsule by mouth daily.      . potassium chloride SA (K-DUR,KLOR-CON) 20 MEQ tablet Take 20 mEq by mouth daily.      . predniSONE (DELTASONE) 1 MG tablet Take 4 mg by mouth daily.        Assessment: transient dysarthria and left-sided weakness, MS changes, HA. Patient has a h/o afib and has been on Coumadin. Foot surgery 2 weeks ago from fall. Placed on Lovenox post-op and developed large abdominal hematoma. Coumadin on hold since awaiting hematoma resorption. Baseline INR 1.02 off Coumadin. Regimen PTA 2mg  TTS, 4mg  MWFSun.   Neuro: h/o CVA  Cards: HTN, HLD, Afib. BP 162/75, HR 48-90 Meds: ASA  81mg , atenolol, Lipitor, diltiazem, losartan, Lovaza, K  Neuro: Celexa  Heme/Onc: Polymyalgia rheumatica on Prednisone  Goal of Therapy:  INR 2-3   Plan:  Patient and family adamantly refusing Lovenox. Dr. Robb Matar ok to d/c and place SCD on one leg. Coumadin 7.5mg  po x 1 today. Education done with patient and family. Daily PT/INR  Pasty Spillers 12/24/2011,1:09 PM

## 2011-12-24 NOTE — Care Management Note (Addendum)
    Page 1 of 1   12/25/2011     5:02:28 PM   CARE MANAGEMENT NOTE 12/25/2011  Patient:  [name] [name], [name] [name]   Account Number:  [id number]  Date Initiated:  12/24/2011  Documentation initiated by:  Junius Creamer  Subjective/Objective Assessment:   adm w stroke symptoms     Action/Plan:   lives w husband, pcp dr Aurther Loft daniel   Anticipated DC Date:  12/27/2011   Anticipated DC Plan:        DC Planning Services  CM consult      Choice offered to / List presented to:             Status of service:  Completed, signed off Medicare Important Message given?   (If response is "NO", the following Medicare IM given date fields will be blank) Date Medicare IM given:   Date Additional Medicare IM given:    Discharge Disposition:  HOME/SELF CARE  Per UR Regulation:    If discussed at Long Length of Stay Meetings, dates discussed:    Comments:  12/25/11 Onnie Boer, RN, BSN 1701 PT HAS BEEN DC'D TO HOME WITH SELF CARE.  PT NEEDS TO GET COUMADIN LEVEL CHECKED AT PCP OFFICE FRIDAY AT 10AM.  PT HAS BEEN NOTIFIED.  5/7 debbie dowell rn,bsn will await pt/ot/st evals for dc planning needs.

## 2011-12-24 NOTE — Evaluation (Signed)
Physical Therapy Evaluation Patient Details Name: [name] [name] MRN: [id number] DOB: 29-Feb-1930 Today's Date: 12/24/2011 Time: 4098-1191 PT Time Calculation (min): 35 min  PT Assessment / Plan / Recommendation Clinical Impression  pt presents with CVA and L sided weakness.  pt with L ankle fx ~2 weeks ago and is now in a fx boot.  Per pt and Dtr Ortho MD told her she could WB 50% through L LE while wearing boot.  pt moving well and per family seems to be near her functional level prior to CVA although is still mildly unsteady due to L ankle fx.  Will follow pt acutely to continue to work on mobility and safety.      PT Assessment  Patient needs continued PT services    Follow Up Recommendations  Home health PT;Supervision - Intermittent    Equipment Recommendations  None recommended by PT    Frequency Min 4X/week    Precautions / Restrictions Precautions Precautions: Fall Required Braces or Orthoses: Other Brace/Splint Other Brace/Splint: L Cast Boot Restrictions Weight Bearing Restrictions: Yes LLE Weight Bearing: Partial weight bearing LLE Partial Weight Bearing Percentage or Pounds: 50 Other Position/Activity Restrictions: Per pt Ortho MD told her no more than 50% WBing through her L LE while wearing cast boot.     Pertinent Vitals/Pain       Mobility  Bed Mobility Bed Mobility: Supine to Sit;Sitting - Scoot to Edge of Bed Supine to Sit: 6: Modified independent (Device/Increase time);With rails Sitting - Scoot to Edge of Bed: 6: Modified independent (Device/Increase time) Details for Bed Mobility Assistance: Needs increased time, but demos good technique.   Transfers Transfers: Sit to Stand;Stand to Sit Sit to Stand: 4: Min guard;With upper extremity assist;From bed;From toilet Stand to Sit: 4: Min guard;With upper extremity assist;To chair/3-in-1;To toilet Details for Transfer Assistance: cues for use of UEs, control descent to chair/toilet.    Ambulation/Gait Ambulation/Gait Assistance: 4: Min guard Ambulation Distance (Feet): 140 Feet Assistive device: Rolling walker Ambulation/Gait Assistance Details: demos good sequencing, but needs cues for upright posture, 50% WBing, positioning in RW.  pt tends to stay behind RW and seems to be putting more than 50% through L LE.   Gait Pattern: Step-through pattern;Decreased stride length;Trunk flexed Stairs: No Wheelchair Mobility Wheelchair Mobility: No Modified Rankin (Stroke Patients Only) Modified Rankin: Moderately severe disability    Exercises     PT Goals Acute Rehab PT Goals PT Goal Formulation: With patient Time For Goal Achievement: 12/31/11 Potential to Achieve Goals: Good Pt will go Supine/Side to Sit: Independently PT Goal: Supine/Side to Sit - Progress: Goal set today Pt will go Sit to Supine/Side: Independently PT Goal: Sit to Supine/Side - Progress: Goal set today Pt will go Sit to Stand: with modified independence PT Goal: Sit to Stand - Progress: Goal set today Pt will go Stand to Sit: with modified independence PT Goal: Stand to Sit - Progress: Goal set today Pt will Ambulate: >150 feet;with modified independence;with rolling walker PT Goal: Ambulate - Progress: Goal set today Pt will Go Up / Down Stairs: 1-2 stairs;with supervision;with rolling walker PT Goal: Up/Down Stairs - Progress: Goal set today  Visit Information  Last PT Received On: 12/24/11 Assistance Needed: +1    Subjective Data  Subjective: I've been in this bed too long.   Patient Stated Goal: Home   Prior Functioning  Home Living Lives With: Spouse Available Help at Discharge: Family;Available 24 hours/day (Dtr is an Charity fundraiser) Type of Home: House Home Access:  Stairs to enter [employer] of Steps: 1 Entrance Stairs-Rails: None Home Layout: One level;Laundry or work area in basement [employer] Shower/Tub: Forensic scientist: Academic librarian: Yes How Accessible: Accessible via walker Home Adaptive Equipment: Bedside commode/3-in-1;Tub transfer bench;Walker - rolling Prior Function Level of Independence: Independent with assistive device(s) (Since L ankle fx ~2 weeks ago.  ) Able to Take Stairs?:  (With A since ankle fx) Driving: No Vocation: Retired Comments: pt was Independent prior to ankle fx ~2 weeks ago and now only requires A for housekeeping and errands.   Communication Communication: No difficulties Dominant Hand: Right    Cognition  Overall Cognitive Status: Appears within functional limits for tasks assessed/performed Arousal/Alertness: Awake/alert Orientation Level: Oriented X4 / Intact Behavior During Session: [facility] for tasks performed    Extremity/Trunk Assessment Right Lower Extremity Assessment RLE ROM/Strength/Tone: Within functional levels RLE Sensation: Deficits RLE Sensation Deficits: pt with diminished Light touch in R foot from old CVA Left Lower Extremity Assessment LLE ROM/Strength/Tone: Deficits LLE ROM/Strength/Tone Deficits: pt with ankle fx ~2weeks ago.  ROM WFL except where in cast boot.  Strength functional and >3/5, however not formally assessed.   LLE Sensation: WFL - Light Touch   Balance Balance Balance Assessed: No  End of Session PT - End of Session Equipment Utilized During Treatment: Gait belt (L cast boot) Activity Tolerance: Patient tolerated treatment well Patient left: in chair;with call bell/phone within reach;with family/visitor present Nurse Communication: Mobility status   Sunny Schlein, [place] [phone] 12/24/2011, 3:28 PM

## 2011-12-24 NOTE — Progress Notes (Signed)
*  PRELIMINARY RESULTS* Vascular Ultrasound Carotid Duplex (Doppler) has been completed.   No evidence of right internal carotid artery stenosis. Left distal internal carotid artery has evidence of elevated velocities in the 40-59% range. Bilateral antegrade vertebral artery flow.  Malachy Moan, RDMS, RDCS 12/24/2011, 11:22 AM

## 2011-12-25 DIAGNOSIS — I6330 Cerebral infarction due to thrombosis of unspecified cerebral artery: Secondary | ICD-10-CM | POA: Diagnosis not present

## 2011-12-25 DIAGNOSIS — I4891 Unspecified atrial fibrillation: Secondary | ICD-10-CM

## 2011-12-25 DIAGNOSIS — G459 Transient cerebral ischemic attack, unspecified: Secondary | ICD-10-CM

## 2011-12-25 DIAGNOSIS — I10 Essential (primary) hypertension: Secondary | ICD-10-CM | POA: Diagnosis not present

## 2011-12-25 LAB — PROTIME-INR
INR: 1.05 (ref 0.00–1.49)
Prothrombin Time: 13.9 seconds (ref 11.6–15.2)

## 2011-12-25 MED ORDER — WARFARIN SODIUM 4 MG PO TABS: 4.0000 mg | ORAL_TABLET | Freq: Every day | ORAL | Status: AC

## 2011-12-25 MED ORDER — ATORVASTATIN CALCIUM 40 MG PO TABS
40.0000 mg | ORAL_TABLET | Freq: Every day | ORAL | Status: DC
Start: 2011-12-25 — End: 2011-12-25
  Filled 2011-12-25: qty 1

## 2011-12-25 NOTE — Discharge Summary (Signed)
Physician Discharge Summary  [name] [name] OZH:[id number] DOB: November 30, 1929 DOA: 12/23/2011  PCP: Donzetta Sprung, MD, MD  Admit date: 12/23/2011 Discharge date: 12/25/2011  Discharge Diagnoses:   Discharge Condition: Stable  Disposition:  Home with physical therapy  History of present illness:  78 year-old female with known history of hypertension, atrial fibrillation, polymyalgia rheumatica and previous history of stroke which affected the right side around noon time patient's husband found patient was finding it difficult to bring out words. This progressively got worse and and patient also got confused. Patient's husband called her daughter and eventually called the EMS by 4 PM. When EMS arrived patient was found to have mild left-sided weakness. Patient was brought as a code stroke. CT of the head was done which was negative for anything acute. Neurologist Dr. Roseanne Reno saw the patient and felt patient was not a candidate for TPA and requested admission for further observation and management. Patient on December 02, 2011 last month had surgery for left ankle fracture. After which patient was discharged to rehabilitation and was found to have left-sided abdominal hematoma. Patient was transferred back to [facility] and Coumadin was discontinued and received 4 units of packed but self and one unit of FFP. Since then patient has been off Coumadin.  Patient at this time when I examined still has difficulty being out words though patient's husband feels she has greatly improved. She still has minimal left upper extremity weakness. Denies any chest pain, shortness of breath, palpitations, nausea, vomiting, abdominal pain, diarrhea or headache. Denies any difficulty swallowing or any blurred vision. Patient did have some headache initially which has resolved.    Hospital Course:   -CVA on the left insular region: Neurology was consulted they thought this was probably secondary to her atrial fibrillation  MRI of the head was done. Carotid Doppler was done 6 months prior to admission that showed no obstruction. She was monitored on telemetry with no events. Neurology recommended to start Coumadin. She relates that she was slightly concerned as she had developed a abdominal hematoma while on Coumadin. The risk and benefits of not putting her on Coumadin were discussed with her versus the risks of putting her on Coumadin she seems to understand. And she agrees to start on Coumadin. This was started on low-dose and we will let it drift up she will followup with the Coumadin clinic as an outpatient and with Dr. Pearlean Brownie in 2-4 weeks.  -Hyperlipidemia HDL low LDL greater than 100 she was started on statin she'll followup with her primary care Dr.  Marland KitchenHypertension She was allow permissive hypertension on initial admission after the second day her blood pressure medications were started she will followup with her primary care doctor and titrate as tolerated.  -Atrial fibrillation Rate control she was started on Coumadin, risks and benefits discussed.  Discharge Exam: Filed Vitals:   12/25/11 0549  BP: 156/63  Pulse: 63  Temp: 98.6 F (37 C)  Resp: 20   Filed Vitals:   12/24/11 1800 12/24/11 2207 12/25/11 0228 12/25/11 0549  BP: 163/79 181/72 152/77 156/63  Pulse: 81 59 92 63  Temp: 98.6 F (37 C) 99 F (37.2 C) 98.8 F (37.1 C) 98.6 F (37 C)  TempSrc: Oral Oral Oral Oral  Resp: 18 20 20 20   Height:      Weight:      SpO2: 98% 94% 94% 97%   General: Alert and oriented x3  Cardiovascular: RRR no murmurs rub or gallops  Respiratory: good  air movement CTA b/l  Neuro: no focal, much improved speech.  Discharge Instructions  Discharge Orders    Future Appointments: Provider: Department: Dept Phone: Center:   02/10/2012 9:15 AM June Leap, MD [facility] [phone] [facility]     Future Orders Please Complete By Expires   Diet - low sodium heart healthy      Increase activity  slowly        Medication List  As of 12/25/2011  8:38 AM   STOP taking these medications         aspirin EC 81 MG tablet         TAKE these medications         atenolol 50 MG tablet   Commonly known as: TENORMIN   Take 50 mg by mouth daily.      atorvastatin 20 MG tablet   Commonly known as: LIPITOR   Take 20 mg by mouth daily.      Biotin 1000 MCG tablet   Take 1,000 mcg by mouth daily.      CALTRATE 600+D 600-400 MG-UNIT per tablet   Generic drug: Calcium Carbonate-Vitamin D   Take 1 tablet by mouth daily.      citalopram 20 MG tablet   Commonly known as: CELEXA   Take 20 mg by mouth daily.      diltiazem 240 MG 24 hr capsule   Commonly known as: CARDIZEM CD   Take 240 mg by mouth daily.      Fish Oil 600 MG Caps   Take 1 capsule by mouth daily.      losartan 100 MG tablet   Commonly known as: COZAAR   Take 100 mg by mouth daily.      mulitivitamin with minerals Tabs   Take 1 tablet by mouth daily.      potassium chloride SA 20 MEQ tablet   Commonly known as: K-DUR,KLOR-CON   Take 20 mEq by mouth daily.      predniSONE 1 MG tablet   Commonly known as: DELTASONE   Take 4 mg by mouth daily.      warfarin 4 MG tablet   Commonly known as: COUMADIN   Take 1 tablet (4 mg total) by mouth daily.           Follow-up Information    Follow up with [name],PRAMODKUMAR P, MD in 2 weeks. (hospital follow up)    Contact information:   [address], [address] [facility] [place] [address] [phone]       Follow up with Donzetta Sprung, MD. (hospital)    Contact information:   [address] [place]. [place] [address] [phone]           The results of significant diagnostics from this hospitalization (including imaging, microbiology, ancillary and laboratory) are listed below for reference.    Significant Diagnostic Studies: Dg Chest 2 View  12/23/2011  *RADIOLOGY REPORT*  Clinical Data: 78 year old female Code  stroke.  Hypertension atrial fibrillation.  CHEST - 2 VIEW  Comparison: CT abdomen 12/21/2008.  Findings: Upright and lateral views of the chest.  Mild cardiomegaly. Other mediastinal contours are within normal limits. No pneumothorax, pulmonary edema or consolidation.  Small left pleural effusion cannot be excluded.  No other confluent pulmonary opacity. No acute osseous abnormality identified.  IMPRESSION: Cardiomegaly.  Small left pleural effusion suspected.  Original Report Authenticated By: Harley Hallmark, M.D.   Ct Head Wo Contrast  12/23/2011  *  RADIOLOGY REPORT*  Clinical Data: Code stroke.  Left-sided weakness.  Headache.  CT HEAD WITHOUT CONTRAST  Technique:  Contiguous axial images were obtained from the base of the skull through the vertex without contrast.  Comparison: None.  Findings: The brain stem, cerebellum, cerebral peduncles, and thalami appear intact.  The left MCA distribution encephalomalacia is probably from an old stroke, although subacute component is difficult to totally exclude the degree of white matter involvement.  No right-sided stroke is observed.  No intracranial hemorrhage or mass lesion is identified. No hydrocephalus or mass lesion.  IMPRESSION:  1.  The left-sided MCA distribution infarct has imaging characteristics suggesting that it is chronic, although some of the white matter hypodensity in this area is nonspecific in terms of age.  However, this would be an unlikely cause for left-sided weakness given the location.  No right-sided stroke is observed. No intracranial hemorrhage is evident.  Critical Value/emergent results were called by telephone to Dr. [place] Sheldon (who verbally acknowledged these results) at the time of interpretation on 12/23/2011 at 6:36 p.m.  Original Report Authenticated By: Dellia Cloud, M.D.   Mr Brain Wo Contrast  12/24/2011  *RADIOLOGY REPORT*  Clinical Data:  Prior infarct.  Left-sided weakness and slurred speech onset yesterday has  improved.  Hypertension and hyperlipidemia.  Atrial fibrillation.  MRI HEAD WITHOUT CONTRAST MRA HEAD WITHOUT CONTRAST  Technique: Multiplanar, multiecho pulse sequences of the brain and surrounding structures were obtained according to standard protocol without intravenous contrast.  Angiographic images of the head were obtained using MRA technique without contrast.  Comparison: 12/23/2011 CT.  No comparison MR.  MRI HEAD  Findings:  Remote left frontal periopercular/parietal lobe infarct with encephalomalacia.  Acute non hemorrhagic infarct posterior left periopercular/sub insular region.  Small acute infarct left periatrial region.  No intracranial hemorrhage.  Small vessel disease type changes.  Global atrophy without hydrocephalus.  Partially empty sella incidentally noted.  Abnormal intracranial vasculature as detailed below.  No intracranial mass lesion detected on this unenhanced exam.  Minimal to mild ethmoid sinus air cells and maxillary sinus mucosal thickening.  IMPRESSION: Remote left frontal periopercular/parietal lobe infarct with encephalomalacia.  Acute non hemorrhagic infarct posterior left periopercular/sub insular region.  Small acute infarct left periatrial region.  MRA HEAD  Findings: Mild ectasia of the petrous segment of the right internal carotid artery.  Ectasia of the cavernous segment of the right internal carotid artery.  This probably is related to atherosclerotic type changes and mild motion artifact rather than blister type aneurysm.  Decreased number visualized the left middle cerebral artery branch vessels consistent with the patient's acute and chronic infarct in this distribution.  Middle cerebral artery mild to moderate branch vessel irregularity bilaterally.  No high-grade stenosis of the left carotid terminus, M1 segment or bifurcation of the left middle cerebral artery.  Mild bulge superior aspect of the M1 segment of the right middle cerebral artery appears to be origin of a  vessel rather than aneurysm.  Right vertebral artery is occluded.  Irregularity with mild narrowing distal left vertebral artery.  Nonvisualization PICAs.  Two AICAs noted bilaterally.  Mild to slightly moderate narrowing of the mid to distal aspect of the basilar artery.  Basilar artery has a slightly bulbous appearance from which vessels arise without discrete aneurysm.  Fetal type origin of the right posterior cerebral artery with diminutive size P1 segment right posterior cerebral artery.  Mild to moderate narrowing involving portions of the superior cerebellar arteries and left posterior  cerebral artery with poor delineation of the right posterior cerebral artery branch vessels.  IMPRESSION: Decreased number visualized the left middle cerebral artery branch vessels consistent with the patient's acute and chronic infarct in this distribution.  Intracranial atherosclerotic type changes otherwise as noted above including occluded right vertebral artery.  This has been made a PRA call report utilizing dashboard call feature.  Original Report Authenticated By: Fuller Canada, M.D.   Mr Mra Head/brain Wo Cm  12/24/2011  *RADIOLOGY REPORT*  Clinical Data:  Prior infarct.  Left-sided weakness and slurred speech onset yesterday has improved.  Hypertension and hyperlipidemia.  Atrial fibrillation.  MRI HEAD WITHOUT CONTRAST MRA HEAD WITHOUT CONTRAST  Technique: Multiplanar, multiecho pulse sequences of the brain and surrounding structures were obtained according to standard protocol without intravenous contrast.  Angiographic images of the head were obtained using MRA technique without contrast.  Comparison: 12/23/2011 CT.  No comparison MR.  MRI HEAD  Findings:  Remote left frontal periopercular/parietal lobe infarct with encephalomalacia.  Acute non hemorrhagic infarct posterior left periopercular/sub insular region.  Small acute infarct left periatrial region.  No intracranial hemorrhage.  Small vessel disease type  changes.  Global atrophy without hydrocephalus.  Partially empty sella incidentally noted.  Abnormal intracranial vasculature as detailed below.  No intracranial mass lesion detected on this unenhanced exam.  Minimal to mild ethmoid sinus air cells and maxillary sinus mucosal thickening.  IMPRESSION: Remote left frontal periopercular/parietal lobe infarct with encephalomalacia.  Acute non hemorrhagic infarct posterior left periopercular/sub insular region.  Small acute infarct left periatrial region.  MRA HEAD  Findings: Mild ectasia of the petrous segment of the right internal carotid artery.  Ectasia of the cavernous segment of the right internal carotid artery.  This probably is related to atherosclerotic type changes and mild motion artifact rather than blister type aneurysm.  Decreased number visualized the left middle cerebral artery branch vessels consistent with the patient's acute and chronic infarct in this distribution.  Middle cerebral artery mild to moderate branch vessel irregularity bilaterally.  No high-grade stenosis of the left carotid terminus, M1 segment or bifurcation of the left middle cerebral artery.  Mild bulge superior aspect of the M1 segment of the right middle cerebral artery appears to be origin of a vessel rather than aneurysm.  Right vertebral artery is occluded.  Irregularity with mild narrowing distal left vertebral artery.  Nonvisualization PICAs.  Two AICAs noted bilaterally.  Mild to slightly moderate narrowing of the mid to distal aspect of the basilar artery.  Basilar artery has a slightly bulbous appearance from which vessels arise without discrete aneurysm.  Fetal type origin of the right posterior cerebral artery with diminutive size P1 segment right posterior cerebral artery.  Mild to moderate narrowing involving portions of the superior cerebellar arteries and left posterior cerebral artery with poor delineation of the right posterior cerebral artery branch vessels.   IMPRESSION: Decreased number visualized the left middle cerebral artery branch vessels consistent with the patient's acute and chronic infarct in this distribution.  Intracranial atherosclerotic type changes otherwise as noted above including occluded right vertebral artery.  This has been made a PRA call report utilizing dashboard call feature.  Original Report Authenticated By: Fuller Canada, M.D.    Microbiology: No results found for this or any previous visit (from the past 240 hour(s)).   Labs: Basic Metabolic Panel:  Lab 12/24/11 1610 12/23/11 1828 12/23/11 1820  NA 135 137 134*  K 4.0 4.0 --  CL 105 109 103  CO2 21 -- 17*  GLUCOSE 106* 115* 109*  BUN 13 17 17   CREATININE 0.81 1.00 0.91  CALCIUM 8.9 -- 9.7  MG -- -- --  PHOS -- -- --   Liver Function Tests:  Lab 12/24/11 0545 12/23/11 1820  AST 16 21  ALT 13 16  ALKPHOS 89 96  BILITOT 0.8 0.8  PROT 6.4 7.2  ALBUMIN 3.3* 3.6   No results found for this basename: LIPASE:5,AMYLASE:5 in the last 168 hours No results found for this basename: AMMONIA:5 in the last 168 hours CBC:  Lab 12/24/11 0545 12/23/11 1828 12/23/11 1820  WBC 12.9* -- 13.3*  NEUTROABS -- -- 9.4*  HGB 11.4* 12.9 12.9  HCT 33.8* 38.0 37.5  MCV 87.3 -- 87.6  PLT 531* -- 561*   Cardiac Enzymes:  Lab 12/23/11 1821  CKTOTAL 62  CKMB 1.3  CKMBINDEX --  TROPONINI <0.30   BNP: No components found with this basename: POCBNP:5 CBG: No results found for this basename: GLUCAP:5 in the last 168 hours  Time coordinating discharge: Greater than 30 minutes  Signed:  Lambert Keto, MD  [facility] 12/25/2011, 8:38 AM

## 2011-12-25 NOTE — Evaluation (Signed)
Speech Language Pathology Evaluation Patient Details Name: [name] [name] MRN: [id number] DOB: 11-23-1929 Today's Date: 12/25/2011 Time: 4098-1191 SLP Time Calculation (min): 21 min  Problem List:  Patient Active Problem List  Diagnoses  . HYPERLIPIDEMIA  . HYPERTENSION  . Atrial fibrillation  . Polymyalgia rheumatica  . CAROTID BRUIT, RIGHT  . COUGH  . Encounter for long-term (current) use of anticoagulants  . Abnormal EKG  . Cough  . Abnormal EKG  . Stroke  . Dyspnea   Past Medical History:  Past Medical History  Diagnosis Date  . Polymyalgia rheumatica   . Stroke   . Hypertension   . Hyperlipidemia   . Arrhythmia     Atrial fibrillation  . Coronary artery disease      nonobstructive coronary artery diseas coronary artery disease by. Preserved LV function.   Marland Kitchen Permanent atrial fibrillation     On Coumadin.  Marland Kitchen Cough     Upper airway cough syndrome tolerating ACE inhibitor.  . Carotid bruit      no significant disease by carotid Dopplers  . Abnormal EKG      no coronary artery disease by catheterization and no change from prior tracing    Past Surgical History:  Past Surgical History  Procedure Date  . Total abdominal hysterectomy   . Appendectomy     Assessment / Plan / Recommendation Clinical Impression  Demonstrates very mild verbal apraxia as evidenced by inconsistent halts/hesitations with awareness and independent ability to correct both verbal and written expressive language errors.  Cognition and language comprehension abilities appear WFL.  Given patient's rapid recovery, expect these mild expressive hestitations to resolve as well.  No further skilled SLP services.  Educated patient to contact her MD if they don't resolve within the next 5-7 days.      SLP Assessment  Patient does not need any further Speech Lanaguage Pathology Services    Follow Up Recommendations       Frequency and Duration        Pertinent Vitals/Pain n/a    SLP  Evaluation Prior Functioning  Cognitive/Linguistic Baseline: Within functional limits Type of Home: House Lives With: Spouse Available Help at Discharge: Family;Available 24 hours/day Education: [employer] Vocation: Retired   IT consultant  Overall Cognitive Status: Appears within functional limits for tasks assessed Arousal/Alertness: Awake/alert Orientation Level: Oriented X4 Attention: Alternating Memory: Appears intact Awareness: Appears intact Problem Solving: Appears intact Safety/Judgment: Appears intact    Comprehension  Auditory Comprehension Overall Auditory Comprehension: Appears within functional limits for tasks assessed    Expression Verbal Expression Overall Verbal Expression: Impaired Initiation: No impairment Level of Generative/Spontaneous Verbalization: Conversation Repetition: No impairment Naming: No impairment (Hesitations, halts yet accurate) Confrontation: Within functional limits Pragmatics: No impairment Written Expression Dominant Hand: Right Written Expression: Exceptions to [facility] Self Formulation Ability: Sentence (4 pt. identified orthographic errors)   Oral / Motor Oral Motor/Sensory Function Overall Oral Motor/Sensory Function: Appears within functional limits for tasks assessed Motor Speech Overall Motor Speech: Appears within functional limits for tasks assessed Respiration: Within functional limits Phonation: Normal Resonance: Within functional limits Articulation: Within functional limitis Intelligibility: Intelligible Motor Planning: Impaired Level of Impairment: Press photographer Errors: Inconsistent     Myra Rude, M.S.,CCC-SLP Pager [phone] 12/25/2011, 10:35 AM

## 2011-12-25 NOTE — Progress Notes (Signed)
History: [name] [name] is an 78 y.o. female transient dysarthria and left-sided weakness as well as mental status changes. Patient was last seen normal at 1200 today. She arrived in the emergency room she had dysarthric speech, expressive aphasia and was unable to move left extremities. Patient is also confused and agitated. She complained of a headache. 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 No acute changes were noted. Patient has been on aspirin 81 mg per day. She has a history of atrial fibrillation and was on Coumadin.  Had been taken off Coumadin due to surgery.  Had been on lovenox when she developed large abdominal hematoma requiring transfusions.  Patient had been on Coumadin for many years without bleeding complications.  NIH stroke score initially was 10. Repeat NIH stroke score was 2. Patient was not a candidate for intravenous thrombolytic therapy with TPA. She status post foot surgery 2 weeks ago for fracture suffered during a fall and has a brace on her left leg and foot.   LSN: 1200 on 12/23/2011  tPA Given: No: Beyond time window and rapid improvement in deficits  MRankin: 0   Subjective: Speech and communication ability much improved.  Walked with walker yesterday.  Feeling well.    Had been taken off Coumadin due to surgery.  Had been on lovenox when she developed large abdominal hematoma requiring transfusions.  Patient had been on Coumadin for many years without bleeding complications.    Large CVA years ago with residual Right sided weakness and sensory disturbance.    Family at bedside. Objective: BP 156/63  Pulse 63  Temp(Src) 98.6 F (37 C) (Oral)  Resp 20  Ht 5\' 7"  (1.702 m)  Wt 74.3 kg (163 lb 12.8 oz)  BMI 25.65 kg/m2  SpO2 97%  Medications: Scheduled:    . aspirin  81 mg Oral Daily  . atenolol  50 mg Oral QHS  . atorvastatin  20 mg Oral q1800  . calcium-vitamin D  1 tablet Oral Daily  . citalopram  20 mg  Oral Daily  . diltiazem  240 mg Oral Daily  . losartan  100 mg Oral Daily  . omega-3 acid ethyl esters  1 capsule Oral Daily  . potassium chloride SA  20 mEq Oral Daily  . predniSONE  4 mg Oral Daily  . warfarin  7.5 mg Oral ONCE-1800  . Warfarin - Pharmacist Dosing Inpatient   Does not apply q1800  . DISCONTD: aspirin  300 mg Rectal Daily  . DISCONTD: aspirin  325 mg Oral Daily  . DISCONTD: enoxaparin  40 mg Subcutaneous Q24H  . DISCONTD: simvastatin  40 mg Oral Daily    Neurologic Exam: Mental Status: Alert, oriented, thought content appropriate.  Speech fluent without evidence of aphasia. Able to follow 3 step commands without difficulty. No paraphasias.  Cranial Nerves: II- Visual fields grossly intact.except mild peripheral vision loss due to old stroke. III/IV/VI-Extraocular movements intact.  Pupils reactive bilaterally. V/VII-Smile symmetric VIII-hearing grossly intact IX/X-normal gag XI-bilateral shoulder shrug XII-midline tongue extension Motor: 5/5 bilaterally with normal tone and bulk Sensory: Light touch intact throughout, bilaterally Deep Tendon Reflexes: 1+ and symmetric throughout Plantars: Flexor on the right; brace on left foot and leg. Cerebellar: Normal finger-to-nose, normal rapid alternating movements and normal heel-to-shin test.   Lab Results: Basic Metabolic Panel:  Lab 12/24/11 4540 12/23/11 1828 12/23/11 1820  NA 135 137 --  K 4.0 4.0 --  CL 105 109 --  CO2 21 -- 17*  GLUCOSE 106* 115* --  BUN 13 17 --  CREATININE 0.81 1.00 --  CALCIUM 8.9 -- 9.7  MG -- -- --  PHOS -- -- --   Liver Function Tests:  Lab 12/24/11 0545 12/23/11 1820  AST 16 21  ALT 13 16  ALKPHOS 89 96  BILITOT 0.8 0.8  PROT 6.4 7.2  ALBUMIN 3.3* 3.6   CBC:  Lab 12/24/11 0545 12/23/11 1828 12/23/11 1820  WBC 12.9* -- 13.3*  NEUTROABS -- -- 9.4*  HGB 11.4* 12.9 --  HCT 33.8* 38.0 --  MCV 87.3 -- 87.6  PLT 531* -- 561*   Cardiac Enzymes:  Lab 12/23/11 1821    CKTOTAL 62  CKMB 1.3  CKMBINDEX --  TROPONINI <0.30   Fasting Lipid Panel:  Lab 12/24/11 0545  CHOL 190  HDL 36*  LDLCALC 126*  TRIG 139  CHOLHDL 5.3  LDLDIRECT --   Thyroid Function Tests:  Lab 12/24/11 0545  TSH 1.055  T4TOTAL --  FREET4 --  T3FREE --  THYROIDAB --   Coagulation:  Lab 12/25/11 0512 12/23/11 1820  LABPROT 13.9 13.6  INR 1.05 1.02   Urinalysis:  Lab 12/23/11 2034  COLORURINE YELLOW  LABSPEC 1.011  PHURINE 7.5  GLUCOSEU NEGATIVE  HGBUR NEGATIVE  BILIRUBINUR NEGATIVE  KETONESUR NEGATIVE  PROTEINUR NEGATIVE  UROBILINOGEN 0.2  NITRITE NEGATIVE  LEUKOCYTESUR NEGATIVE   12/24/11 Hg A1C: 5.6  Study Results:  12/23/2011 CHEST - 2 VIEW Findings: Upright and lateral views of the chest.  Mild cardiomegaly. Other mediastinal contours are within normal limits. No pneumothorax, pulmonary edema or consolidation.  Small left pleural effusion cannot be excluded.  No other confluent pulmonary opacity. No acute osseous abnormality identified.  IMPRESSION: Cardiomegaly.  Small left pleural effusion suspected.   H.LEE HALL III, M.D.   12/23/2011   CT HEAD WITHOUT CONTRAST   Findings: The brain stem, cerebellum, cerebral peduncles, and thalami appear intact.  The left MCA distribution encephalomalacia is probably from an old stroke, although subacute component is difficult to totally exclude the degree of white matter involvement.  No right-sided stroke is observed.  No intracranial hemorrhage or mass lesion is identified. No hydrocephalus or mass lesion.  IMPRESSION:  1.  The left-sided MCA distribution infarct has imaging characteristics suggesting that it is chronic, although some of the white matter hypodensity in this area is nonspecific in terms of age.  However, this would be an unlikely cause for left-sided weakness given the location.  No right-sided stroke is observed. No intracranial hemorrhage is evident.  Dellia Cloud, M.D.   12/24/2011   MRI HEAD  WITHOUT CONTRAST  Findings:  Remote left frontal periopercular/parietal lobe infarct with encephalomalacia.  Acute non hemorrhagic infarct posterior left periopercular/sub insular region.  Small acute infarct left periatrial region.  No intracranial hemorrhage.  Small vessel disease type changes.  Global atrophy without hydrocephalus.  Partially empty sella incidentally noted.  Abnormal intracranial vasculature as detailed below.  No intracranial mass lesion detected on this unenhanced exam.  Minimal to mild ethmoid sinus air cells and maxillary sinus mucosal thickening.  IMPRESSION: Remote left frontal periopercular/parietal lobe infarct with encephalomalacia.  Acute non hemorrhagic infarct posterior left periopercular/sub insular region.  Small acute infarct left periatrial region.  Fuller Canada, M.D  12/24/11 MRA HEAD WITHOUT CONTRAST  Findings: Mild ectasia of the petrous segment of the right internal carotid artery.  Ectasia of the cavernous segment of the right internal carotid artery.  This  probably is related to atherosclerotic type changes and mild motion artifact rather than blister type aneurysm.  Decreased number visualized the left middle cerebral artery branch vessels consistent with the patient's acute and chronic infarct in this distribution.  Middle cerebral artery mild to moderate branch vessel irregularity bilaterally.  No high-grade stenosis of the left carotid terminus, M1 segment or bifurcation of the left middle cerebral artery.  Mild bulge superior aspect of the M1 segment of the right middle cerebral artery appears to be origin of a vessel rather than aneurysm.  Right vertebral artery is occluded.  Irregularity with mild narrowing distal left vertebral artery.  Nonvisualization PICAs.  Two AICAs noted bilaterally.  Mild to slightly moderate narrowing of the mid to distal aspect of the basilar artery.  Basilar artery has a slightly bulbous appearance from which vessels arise without  discrete aneurysm.  Fetal type origin of the right posterior cerebral artery with diminutive size P1 segment right posterior cerebral artery.  Mild to moderate narrowing involving portions of the superior cerebellar arteries and left posterior cerebral artery with poor delineation of the right posterior cerebral artery branch vessels.  IMPRESSION: Decreased number visualized the left middle cerebral artery branch vessels consistent with the patient's acute and chronic infarct in this distribution.  Intracranial atherosclerotic type changes otherwise as noted above including occluded right vertebral artery. Fuller Canada, M.D.   12/24/11 2D Echo  Left ventricle: Wall thickness was increased in a pattern of mild LVH. Systolic function was normal. The estimated ejection fraction was in the range of 60% to 65%. - Mitral valve: Mild regurgitation. Valve area by pressure half-time: 2.34cm^2. - Left atrium: The atrium was mildly to moderately dilated. - Right atrium: The atrium was mildly dilated.  Therapies: recommend HH therapies.  Assessment/Plan: 78 y.o. female with left insular CVA and decreased number visualized the left middle cerebral artery branch vessels consistent with the patient's acute and chronic infarct in this distribution; embolic phenomenon.   Patient has chronic afib and had been on Coumadin therapy without complication until it was held due to left leg surgery.  The patient developed blood loss anemia due to lovenox injections post op.  LDL 126, not at goal <100. A1C 5.6  Stroke Risk Factors - atrial fibrillation, hyperlipidemia and hypertension, prior CVA  Plan:   1.  Prophylactic therapy-Continue baby ASA and start Coumadin.   Once INR therapeutic, D/C ASA.  Has to be on DVT prohylaxis lovenox, as the can not use SCD's due to surgical dressing on left leg.  Monitor CBC. 2 . Risk factor modification; increase lipitor to 40 mg. 3. Telemetry monitoring  4.  coumadin per  pharmacy. D/W patient and husband  Will sign off.  Please call with questions. Patient needs to follow up with Dr. Pearlean Brownie in 2 months.   LOS: 2 days   Jana Hakim [facility] [phone] 12/25/2011  9:47 AM  Patient has been seen and examined by Dr. Pearlean Brownie who agrees with above.and has examined patient, and developed plan of care

## 2011-12-25 NOTE — Progress Notes (Addendum)
[facility] PROGRESS NOTE  [name] [name]:[id number] DOB: [birth year] DOA: 12/23/2011 PCP: Donzetta Sprung, MD, MD  Assessment/Plan: Patient Active Hospital Problem List: CVA (05/11/2007): -Not a candidate for TPA. -HgbA1c 5.6 , fasting lipid panel: LDL>100, HDL <36 statins. -MRI, MRA of the brain as below. MRA showed decreased visualized the left middle cerebral artery branch -PT  Recommended home PT, Speech consult refused. -Echocardiogram: Normal EF with mitral regurgitation -Prophylactic therapy-Antiplatelet med: Aspirin - dose 81 mg PO daily  -Cardiac Monitoring, no events -carotid doppler on 03/15/2011:carotid Dopplers which were essentially within normal limits.   HYPERLIPIDEMIA (05/11/2007)  statins  HYPERtENSION (05/11/2007) Allow permissive HTN goal <180/100  Atrial fibrillation (05/11/2007) - Rate controled, ASA 325 -Patient is adamant about using Coumadin.  Polymyalgia rheumatica (05/11/2007) - Steroids.  Code Status: Full code Family Communication: Son [phone] Disposition Plan: TBD  Lambert Keto, MD  [facility] Pager [phone]  If 7PM-7AM, please contact night-coverage [email or web address] Password [email or web address] 12/25/2011, 8:01 AM   LOS: 2 days    Subjective: No complains.  Objective: Filed Vitals:   12/24/11 1800 12/24/11 2207 12/25/11 0228 12/25/11 0549  BP: 163/79 181/72 152/77 156/63  Pulse: 81 59 92 63  Temp: 98.6 F (37 C) 99 F (37.2 C) 98.8 F (37.1 C) 98.6 F (37 C)  TempSrc: Oral Oral Oral Oral  Resp: 18 20 20 20   Height:      Weight:      SpO2: 98% 94% 94% 97%    Intake/Output Summary (Last 24 hours) at 12/25/11 0801 Last data filed at 12/24/11 1700  Gross per 24 hour  Intake    220 ml  Output      0 ml  Net    220 ml    Exam:   General:  Alert and oriented x3  Cardiovascular: RRR no murmurs rub or gallops  Respiratory: good air movement CTA b/l  [place]: no focal, much improved speech.   Data  Reviewed: Basic Metabolic Panel:  Lab 12/24/11 6578 12/23/11 1828 12/23/11 1820  NA 135 137 134*  K 4.0 4.0 --  CL 105 109 103  CO2 21 -- 17*  GLUCOSE 106* 115* 109*  BUN 13 17 17   CREATININE 0.81 1.00 0.91  CALCIUM 8.9 -- 9.7  MG -- -- --  PHOS -- -- --   Liver Function Tests:  Lab 12/24/11 0545 12/23/11 1820  AST 16 21  ALT 13 16  ALKPHOS 89 96  BILITOT 0.8 0.8  PROT 6.4 7.2  ALBUMIN 3.3* 3.6   No results found for this basename: LIPASE:5,AMYLASE:5 in the last 168 hours No results found for this basename: AMMONIA:5 in the last 168 hours CBC:  Lab 12/24/11 0545 12/23/11 1828 12/23/11 1820  WBC 12.9* -- 13.3*  NEUTROABS -- -- 9.4*  HGB 11.4* 12.9 12.9  HCT 33.8* 38.0 37.5  MCV 87.3 -- 87.6  PLT 531* -- 561*   Cardiac Enzymes:  Lab 12/23/11 1821  CKTOTAL 62  CKMB 1.3  CKMBINDEX --  TROPONINI <0.30   BNP: No components found with this basename: POCBNP:5 CBG: No results found for this basename: GLUCAP:5 in the last 168 hours  No results found for this or any previous visit (from the past 240 hour(s)).   Studies: Dg Chest 2 View  12/23/2011  *RADIOLOGY REPORT*  Clinical Data: 78 year old female Code stroke.  Hypertension atrial fibrillation.  CHEST - 2 VIEW  Comparison: CT abdomen 12/21/2008.  Findings: Upright and lateral views of the chest.  Mild cardiomegaly. Other mediastinal contours are within normal limits. No pneumothorax, pulmonary edema or consolidation.  Small left pleural effusion cannot be excluded.  No other confluent pulmonary opacity. No acute osseous abnormality identified.  IMPRESSION: Cardiomegaly.  Small left pleural effusion suspected.  Original Report Authenticated By: Harley Hallmark, M.D.   Ct Head Wo Contrast  12/23/2011  *RADIOLOGY REPORT*  Clinical Data: Code stroke.  Left-sided weakness.  Headache.  CT HEAD WITHOUT CONTRAST  Technique:  Contiguous axial images were obtained from the base of the skull through the vertex without contrast.   Comparison: None.  Findings: The brain stem, cerebellum, cerebral peduncles, and thalami appear intact.  The left MCA distribution encephalomalacia is probably from an old stroke, although subacute component is difficult to totally exclude the degree of white matter involvement.  No right-sided stroke is observed.  No intracranial hemorrhage or mass lesion is identified. No hydrocephalus or mass lesion.  IMPRESSION:  1.  The left-sided MCA distribution infarct has imaging characteristics suggesting that it is chronic, although some of the white matter hypodensity in this area is nonspecific in terms of age.  However, this would be an unlikely cause for left-sided weakness given the location.  No right-sided stroke is observed. No intracranial hemorrhage is evident.  Critical Value/emergent results were called by telephone to Dr. [place] Sheldon (who verbally acknowledged these results) at the time of interpretation on 12/23/2011 at 6:36 p.m.  Original Report Authenticated By: Dellia Cloud, M.D.    Scheduled Meds:    . aspirin  81 mg Oral Daily  . atenolol  50 mg Oral QHS  . atorvastatin  20 mg Oral q1800  . calcium-vitamin D  1 tablet Oral Daily  . citalopram  20 mg Oral Daily  . diltiazem  240 mg Oral Daily  . losartan  100 mg Oral Daily  . omega-3 acid ethyl esters  1 capsule Oral Daily  . potassium chloride SA  20 mEq Oral Daily  . predniSONE  4 mg Oral Daily  . warfarin  7.5 mg Oral ONCE-1800  . Warfarin - Pharmacist Dosing Inpatient   Does not apply q1800  . DISCONTD: aspirin  300 mg Rectal Daily  . DISCONTD: aspirin  325 mg Oral Daily  . DISCONTD: enoxaparin  40 mg Subcutaneous Q24H  . DISCONTD: simvastatin  40 mg Oral Daily   Continuous Infusions:    . sodium chloride 75 mL/hr at 12/25/11 0228

## 2011-12-25 NOTE — Progress Notes (Signed)
PT Cancellation Note  Treatment cancelled today due to patient stated that she was leaving and felt like she did not need to review anything with PT. Noted that patient had stairs as a PT goal, however patient stated that she doesn't go up to the floor that she would need to use the stairs for. Patient stated that she was ambulating with her husband and that she felt safe to DC home with his assistance. Informed evaluating PT and she stated that as long as the patient felt comfortable that she did well enough on evaluation that she would be fine to DC prior to working with therapy. OT and RN also made aware.  Thanks 12/25/2011 Fredrich Birks PTA [phone] pager [phone] office     Fredrich Birks 12/25/2011, 10:40 AM

## 2011-12-25 NOTE — Discharge Instructions (Signed)
STROKE/TIA DISCHARGE INSTRUCTIONS SMOKING Cigarette smoking nearly doubles your risk of having a stroke & is the single most alterable risk factor  If you smoke or have smoked in the last 12 months, you are advised to quit smoking for your health.  Most of the excess cardiovascular risk related to smoking disappears within a year of stopping.  Ask you doctor about anti-smoking medications  [place] Quit Line: 1-800-QUIT NOW  Free Smoking Cessation Classes [phone]  CHOLESTEROL Know your levels; limit fat & cholesterol in your diet  Lipid Panel     Component Value Date/Time   CHOL 190 12/24/2011 0545   TRIG 139 12/24/2011 0545   HDL 36* 12/24/2011 0545   CHOLHDL 5.3 12/24/2011 0545   VLDL 28 12/24/2011 0545   LDLCALC 126* 12/24/2011 0545      Many patients benefit from treatment even if their cholesterol is at goal.  Goal: Total Cholesterol (CHOL) less than 160  Goal:  Triglycerides (TRIG) less than 150  Goal:  HDL greater than 40  Goal:  LDL (LDLCALC) less than 100   BLOOD PRESSURE American Stroke Association blood pressure target is less that 120/80 mm/Hg  Your discharge blood pressure is:  BP: 162/75 mmHg  Monitor your blood pressure  Limit your salt and alcohol intake  Many individuals will require more than one medication for high blood pressure  DIABETES (A1c is a blood sugar average for last 3 months) Goal HGBA1c is under 7% (HBGA1c is blood sugar average for last 3 months)  Diabetes: {STROKE DC DIABETES:22357}    No results found for this basename: HGBA1C     Your HGBA1c can be lowered with medications, healthy diet, and exercise.  Check your blood sugar as directed by your physician  Call your physician if you experience unexplained or low blood sugars.  PHYSICAL ACTIVITY/REHABILITATION Goal is 30 minutes at least 4 days per week    {STROKE DC ACTIVITY/REHAB:22359}  Activity decreases your risk of heart attack and stroke and makes your heart stronger.  It helps  control your weight and blood pressure; helps you relax and can improve your mood.  Participate in a regular exercise program.  Talk with your doctor about the best form of exercise for you (dancing, walking, swimming, cycling).  DIET/WEIGHT Goal is to maintain a healthy weight  Your discharge diet is: Cardiac *** liquids Your height is:  Height: 5\' 7"  (170.2 cm) Your current weight is: Weight: 74.3 kg (163 lb 12.8 oz) Your Body Mass Index (BMI) is:  BMI (Calculated): 25.7   Following the type of diet specifically designed for you will help prevent another stroke.  Your goal weight range is:  ***  Your goal Body Mass Index (BMI) is 19-24.  Healthy food habits can help reduce 3 risk factors for stroke:  High cholesterol, hypertension, and excess weight.  RESOURCES Stroke/Support Group:  Call [phone]  they meet the 3rd Sunday of the month on the Rehab Unit at [facility], [place] ( no meetings June, July & Aug).  STROKE EDUCATION PROVIDED/REVIEWED AND GIVEN TO PATIENT Stroke warning signs and symptoms How to activate emergency medical system (call 911). Medications prescribed at discharge. Need for follow-up after discharge. Personal risk factors for stroke. Pneumonia vaccine given:   {STROKE DC YES/NO/DATE:22363} Flu vaccine given:   {STROKE DC YES/NO/DATE:22363} My questions have been answered, the writing is legible, and I understand these instructions.  I will adhere to these goals & educational materials that have been provided to me  after my discharge from the hospital.

## 2011-12-27 DIAGNOSIS — I4891 Unspecified atrial fibrillation: Secondary | ICD-10-CM | POA: Diagnosis not present

## 2011-12-28 DIAGNOSIS — F329 Major depressive disorder, single episode, unspecified: Secondary | ICD-10-CM | POA: Diagnosis not present

## 2011-12-28 DIAGNOSIS — IMO0001 Reserved for inherently not codable concepts without codable children: Secondary | ICD-10-CM | POA: Diagnosis not present

## 2011-12-28 DIAGNOSIS — I509 Heart failure, unspecified: Secondary | ICD-10-CM | POA: Diagnosis not present

## 2011-12-28 DIAGNOSIS — I10 Essential (primary) hypertension: Secondary | ICD-10-CM | POA: Diagnosis not present

## 2011-12-28 DIAGNOSIS — J449 Chronic obstructive pulmonary disease, unspecified: Secondary | ICD-10-CM | POA: Diagnosis not present

## 2011-12-28 DIAGNOSIS — I4891 Unspecified atrial fibrillation: Secondary | ICD-10-CM | POA: Diagnosis not present

## 2011-12-30 DIAGNOSIS — I4891 Unspecified atrial fibrillation: Secondary | ICD-10-CM | POA: Diagnosis not present

## 2011-12-30 DIAGNOSIS — I509 Heart failure, unspecified: Secondary | ICD-10-CM | POA: Diagnosis not present

## 2011-12-30 DIAGNOSIS — J449 Chronic obstructive pulmonary disease, unspecified: Secondary | ICD-10-CM | POA: Diagnosis not present

## 2011-12-30 DIAGNOSIS — I10 Essential (primary) hypertension: Secondary | ICD-10-CM | POA: Diagnosis not present

## 2011-12-30 DIAGNOSIS — IMO0001 Reserved for inherently not codable concepts without codable children: Secondary | ICD-10-CM | POA: Diagnosis not present

## 2011-12-30 DIAGNOSIS — F329 Major depressive disorder, single episode, unspecified: Secondary | ICD-10-CM | POA: Diagnosis not present

## 2011-12-31 DIAGNOSIS — E7800 Pure hypercholesterolemia, unspecified: Secondary | ICD-10-CM | POA: Diagnosis not present

## 2011-12-31 DIAGNOSIS — I4891 Unspecified atrial fibrillation: Secondary | ICD-10-CM | POA: Diagnosis not present

## 2011-12-31 DIAGNOSIS — I10 Essential (primary) hypertension: Secondary | ICD-10-CM | POA: Diagnosis not present

## 2011-12-31 DIAGNOSIS — J449 Chronic obstructive pulmonary disease, unspecified: Secondary | ICD-10-CM | POA: Diagnosis not present

## 2011-12-31 DIAGNOSIS — IMO0001 Reserved for inherently not codable concepts without codable children: Secondary | ICD-10-CM | POA: Diagnosis not present

## 2011-12-31 DIAGNOSIS — F329 Major depressive disorder, single episode, unspecified: Secondary | ICD-10-CM | POA: Diagnosis not present

## 2011-12-31 DIAGNOSIS — R5381 Other malaise: Secondary | ICD-10-CM | POA: Diagnosis not present

## 2011-12-31 DIAGNOSIS — I509 Heart failure, unspecified: Secondary | ICD-10-CM | POA: Diagnosis not present

## 2012-01-01 DIAGNOSIS — I4891 Unspecified atrial fibrillation: Secondary | ICD-10-CM | POA: Diagnosis not present

## 2012-01-01 DIAGNOSIS — IMO0001 Reserved for inherently not codable concepts without codable children: Secondary | ICD-10-CM | POA: Diagnosis not present

## 2012-01-01 DIAGNOSIS — J449 Chronic obstructive pulmonary disease, unspecified: Secondary | ICD-10-CM | POA: Diagnosis not present

## 2012-01-01 DIAGNOSIS — I10 Essential (primary) hypertension: Secondary | ICD-10-CM | POA: Diagnosis not present

## 2012-01-01 DIAGNOSIS — F329 Major depressive disorder, single episode, unspecified: Secondary | ICD-10-CM | POA: Diagnosis not present

## 2012-01-01 DIAGNOSIS — I509 Heart failure, unspecified: Secondary | ICD-10-CM | POA: Diagnosis not present

## 2012-01-02 DIAGNOSIS — S82843A Displaced bimalleolar fracture of unspecified lower leg, initial encounter for closed fracture: Secondary | ICD-10-CM | POA: Diagnosis not present

## 2012-01-03 DIAGNOSIS — I10 Essential (primary) hypertension: Secondary | ICD-10-CM | POA: Diagnosis not present

## 2012-01-03 DIAGNOSIS — F329 Major depressive disorder, single episode, unspecified: Secondary | ICD-10-CM | POA: Diagnosis not present

## 2012-01-03 DIAGNOSIS — I509 Heart failure, unspecified: Secondary | ICD-10-CM | POA: Diagnosis not present

## 2012-01-03 DIAGNOSIS — IMO0001 Reserved for inherently not codable concepts without codable children: Secondary | ICD-10-CM | POA: Diagnosis not present

## 2012-01-03 DIAGNOSIS — J449 Chronic obstructive pulmonary disease, unspecified: Secondary | ICD-10-CM | POA: Diagnosis not present

## 2012-01-03 DIAGNOSIS — I4891 Unspecified atrial fibrillation: Secondary | ICD-10-CM | POA: Diagnosis not present

## 2012-01-06 DIAGNOSIS — J449 Chronic obstructive pulmonary disease, unspecified: Secondary | ICD-10-CM | POA: Diagnosis not present

## 2012-01-06 DIAGNOSIS — F329 Major depressive disorder, single episode, unspecified: Secondary | ICD-10-CM | POA: Diagnosis not present

## 2012-01-06 DIAGNOSIS — IMO0001 Reserved for inherently not codable concepts without codable children: Secondary | ICD-10-CM | POA: Diagnosis not present

## 2012-01-06 DIAGNOSIS — I10 Essential (primary) hypertension: Secondary | ICD-10-CM | POA: Diagnosis not present

## 2012-01-06 DIAGNOSIS — I4891 Unspecified atrial fibrillation: Secondary | ICD-10-CM | POA: Diagnosis not present

## 2012-01-06 DIAGNOSIS — I509 Heart failure, unspecified: Secondary | ICD-10-CM | POA: Diagnosis not present

## 2012-01-08 DIAGNOSIS — IMO0001 Reserved for inherently not codable concepts without codable children: Secondary | ICD-10-CM | POA: Diagnosis not present

## 2012-01-08 DIAGNOSIS — I4891 Unspecified atrial fibrillation: Secondary | ICD-10-CM | POA: Diagnosis not present

## 2012-01-08 DIAGNOSIS — I509 Heart failure, unspecified: Secondary | ICD-10-CM | POA: Diagnosis not present

## 2012-01-08 DIAGNOSIS — I10 Essential (primary) hypertension: Secondary | ICD-10-CM | POA: Diagnosis not present

## 2012-01-08 DIAGNOSIS — F329 Major depressive disorder, single episode, unspecified: Secondary | ICD-10-CM | POA: Diagnosis not present

## 2012-01-08 DIAGNOSIS — J449 Chronic obstructive pulmonary disease, unspecified: Secondary | ICD-10-CM | POA: Diagnosis not present

## 2012-01-09 DIAGNOSIS — I6789 Other cerebrovascular disease: Secondary | ICD-10-CM | POA: Diagnosis not present

## 2012-01-09 DIAGNOSIS — M810 Age-related osteoporosis without current pathological fracture: Secondary | ICD-10-CM | POA: Diagnosis not present

## 2012-01-09 DIAGNOSIS — E782 Mixed hyperlipidemia: Secondary | ICD-10-CM | POA: Diagnosis not present

## 2012-01-09 DIAGNOSIS — I4891 Unspecified atrial fibrillation: Secondary | ICD-10-CM | POA: Diagnosis not present

## 2012-01-09 DIAGNOSIS — I10 Essential (primary) hypertension: Secondary | ICD-10-CM | POA: Diagnosis not present

## 2012-01-09 DIAGNOSIS — K5730 Diverticulosis of large intestine without perforation or abscess without bleeding: Secondary | ICD-10-CM | POA: Diagnosis not present

## 2012-01-09 DIAGNOSIS — IMO0002 Reserved for concepts with insufficient information to code with codable children: Secondary | ICD-10-CM | POA: Diagnosis not present

## 2012-01-09 DIAGNOSIS — M1990 Unspecified osteoarthritis, unspecified site: Secondary | ICD-10-CM | POA: Diagnosis not present

## 2012-01-10 DIAGNOSIS — F329 Major depressive disorder, single episode, unspecified: Secondary | ICD-10-CM | POA: Diagnosis not present

## 2012-01-10 DIAGNOSIS — I509 Heart failure, unspecified: Secondary | ICD-10-CM | POA: Diagnosis not present

## 2012-01-10 DIAGNOSIS — I4891 Unspecified atrial fibrillation: Secondary | ICD-10-CM | POA: Diagnosis not present

## 2012-01-10 DIAGNOSIS — J449 Chronic obstructive pulmonary disease, unspecified: Secondary | ICD-10-CM | POA: Diagnosis not present

## 2012-01-10 DIAGNOSIS — IMO0001 Reserved for inherently not codable concepts without codable children: Secondary | ICD-10-CM | POA: Diagnosis not present

## 2012-01-10 DIAGNOSIS — I10 Essential (primary) hypertension: Secondary | ICD-10-CM | POA: Diagnosis not present

## 2012-01-21 DIAGNOSIS — R32 Unspecified urinary incontinence: Secondary | ICD-10-CM | POA: Diagnosis not present

## 2012-01-21 DIAGNOSIS — I6789 Other cerebrovascular disease: Secondary | ICD-10-CM | POA: Diagnosis not present

## 2012-01-21 DIAGNOSIS — I4891 Unspecified atrial fibrillation: Secondary | ICD-10-CM | POA: Diagnosis not present

## 2012-01-22 DIAGNOSIS — R32 Unspecified urinary incontinence: Secondary | ICD-10-CM | POA: Diagnosis not present

## 2012-01-24 DIAGNOSIS — I4891 Unspecified atrial fibrillation: Secondary | ICD-10-CM | POA: Diagnosis not present

## 2012-01-30 DIAGNOSIS — M6789 Other specified disorders of synovium and tendon, multiple sites: Secondary | ICD-10-CM | POA: Diagnosis not present

## 2012-01-30 DIAGNOSIS — IMO0001 Reserved for inherently not codable concepts without codable children: Secondary | ICD-10-CM | POA: Diagnosis not present

## 2012-01-30 DIAGNOSIS — M2140 Flat foot [pes planus] (acquired), unspecified foot: Secondary | ICD-10-CM | POA: Diagnosis not present

## 2012-01-30 DIAGNOSIS — R1310 Dysphagia, unspecified: Secondary | ICD-10-CM | POA: Diagnosis not present

## 2012-01-30 DIAGNOSIS — S82843A Displaced bimalleolar fracture of unspecified lower leg, initial encounter for closed fracture: Secondary | ICD-10-CM | POA: Diagnosis not present

## 2012-02-03 DIAGNOSIS — I4891 Unspecified atrial fibrillation: Secondary | ICD-10-CM | POA: Diagnosis not present

## 2012-02-10 ENCOUNTER — Encounter: Payer: Self-pay | Admitting: Cardiology

## 2012-02-10 ENCOUNTER — Ambulatory Visit (INDEPENDENT_AMBULATORY_CARE_PROVIDER_SITE_OTHER): Payer: Medicare Other | Admitting: Cardiology

## 2012-02-10 VITALS — BP 132/77 | HR 63 | Ht 66.6 in | Wt 157.8 lb

## 2012-02-10 DIAGNOSIS — R0989 Other specified symptoms and signs involving the circulatory and respiratory systems: Secondary | ICD-10-CM

## 2012-02-10 DIAGNOSIS — I6350 Cerebral infarction due to unspecified occlusion or stenosis of unspecified cerebral artery: Secondary | ICD-10-CM | POA: Diagnosis not present

## 2012-02-10 DIAGNOSIS — I4891 Unspecified atrial fibrillation: Secondary | ICD-10-CM

## 2012-02-10 DIAGNOSIS — I10 Essential (primary) hypertension: Secondary | ICD-10-CM

## 2012-02-10 DIAGNOSIS — I639 Cerebral infarction, unspecified: Secondary | ICD-10-CM

## 2012-02-10 DIAGNOSIS — E785 Hyperlipidemia, unspecified: Secondary | ICD-10-CM

## 2012-02-10 DIAGNOSIS — I480 Paroxysmal atrial fibrillation: Secondary | ICD-10-CM

## 2012-02-10 NOTE — Patient Instructions (Signed)
Continue all current medications. Your physician wants you to follow up in: 6 months.  You will receive a reminder letter in the mail one-two months in advance.  If you don't receive a letter, please call our office to schedule the follow up appointment   

## 2012-02-15 NOTE — Assessment & Plan Note (Signed)
Patient is a recent CVA in April.  Coumadin was apparently stopped temporarily.  Coumadin has been started back.  Patient's CHADS2 score is 5.  The patient has at least a 10% risk per year for recurrent stroke.  She will need to continue on Coumadin indefinitely.

## 2012-02-15 NOTE — Progress Notes (Signed)
[name] Bottoms, MD, [facility] ABIM Board Certified in Adult Cardiovascular Medicine,Internal Medicine and Critical Care Medicine    CC:    followup patient after recent stroke                                                                              HPI:     Patient had a recent stroke and was seen at [facility].  Is followed by neurology.  She is back on Coumadin.  She has had no recurrent difficulties.  She has a history of nonobstructive coronary artery disease with preserved LV function.  She has not normal EKG which is nonspecific.  She has hypertension which is well-controlled.  She also has paroxysmal atrial fibrillation but remains in normal sinus rhythm. Cardiac standpoint patient doing well.  She reports no chest pain shortness of breath orthopnea or PND.  She has no palpitations or presyncope or syncope.     PMH: reviewed and listed in Problem List in Electronic Records (and see below) Past Medical History  Diagnosis Date  . Polymyalgia rheumatica   . Stroke   . Hypertension   . Hyperlipidemia   . Arrhythmia     Atrial fibrillation  . Coronary artery disease      nonobstructive coronary artery diseas coronary artery disease by. Preserved LV function.   Marland Kitchen Permanent atrial fibrillation     On Coumadin.  Marland Kitchen Cough     Upper airway cough syndrome tolerating ACE inhibitor.  . Carotid bruit      no significant disease by carotid Dopplers  . Abnormal EKG      no coronary artery disease by catheterization and no change from prior tracing    Past Surgical History  Procedure Date  . Total abdominal hysterectomy   . Appendectomy     Allergies/SH/FHX : available in Electronic Records for review  Allergies  Allergen Reactions  . Sulfonamide Derivatives    History   Social History  . Marital Status: Married    Spouse Name: N/A    Number of Children: N/A  . Years of Education: N/A   Occupational History  . Retired    Social History Main Topics  . Smoking status: Never Smoker    . Smokeless tobacco: Never Used  . Alcohol Use: No  . Drug Use: No  . Sexually Active: Not on file   Other Topics Concern  . Not on file   Social History Narrative  . No narrative on file   Family History  Problem Relation Age of Onset  . Coronary artery disease Other     Medications: Current Outpatient Prescriptions  Medication Sig Dispense Refill  . atenolol (TENORMIN) 50 MG tablet Take 50 mg by mouth daily.        Marland Kitchen atorvastatin (LIPITOR) 20 MG tablet Take 20 mg by mouth daily.      . Biotin 1000 MCG tablet Take 1,000 mcg by mouth daily.      . Calcium Carbonate-Vitamin D (CALTRATE 600+D) 600-400 MG-UNIT per tablet Take 1 tablet by mouth daily.        . citalopram (CELEXA) 20 MG tablet Take 20 mg by mouth daily.      Marland Kitchen  diltiazem (CARDIZEM CD) 240 MG 24 hr capsule Take 240 mg by mouth daily.      Marland Kitchen losartan (COZAAR) 100 MG tablet Take 100 mg by mouth daily.      . Multiple Vitamin (MULITIVITAMIN WITH MINERALS) TABS Take 1 tablet by mouth daily.      . Omega-3 Fatty Acids (FISH OIL) 600 MG CAPS Take 1 capsule by mouth daily.      . predniSONE (DELTASONE) 1 MG tablet Take 4 mg by mouth daily.      Marland Kitchen warfarin (COUMADIN) 4 MG tablet Take 1 tablet (4 mg total) by mouth daily.  20 tablet  0    ROS: No nausea or vomiting. No fever or chills.No melena or hematochezia.No bleeding.No claudication  Physical Exam: BP 132/77  Pulse 63  Ht 5' 6.6" (1.692 m)  Wt 157 lb 12.8 oz (71.578 kg)  BMI 25.01 kg/m2 General:well-nourished female in no distress Neck:F no carotid bruits.  No thyromegaly no nodular thyroid.  JVP is 6 cm Lungs:clear breath sounds bilaterally no wheezing Cardiac:regular rate and rhythm with normal S1-S2 no murmurs rubs or gallops Vascular:no edema.  Normal distal pulses Skin:warm and dry Physcologic:normal affect  12lead ECG:not performed Limited bedside ECHO:N/A No images are attached to the encounter.   I reviewed and summarized the old records. I  reviewed ECG and prior blood work.  Assessment and Plan  Paroxysmal atrial fibrillation Patient is a recent CVA in April.  Coumadin was apparently stopped temporarily.  Coumadin has been started back.  Patient's CHADS2 score is 5.  The patient has at least a 10% risk per year for recurrent stroke.  She will need to continue on Coumadin indefinitely.  CAROTID BRUIT, RIGHT Evaluated [facility].  Reportedly stable  HYPERTENSION Blood pressure control.  HYPERLIPIDEMIA followup primary care physician  Stroke Followed by neurology    Patient Active Problem List  Diagnosis  . HYPERLIPIDEMIA  . HYPERTENSION  . Paroxysmal atrial fibrillation  . Polymyalgia rheumatica  . CAROTID BRUIT, RIGHT  . COUGH  . Encounter for long-term (current) use of anticoagulants  . Abnormal EKG  . Cough  . Abnormal EKG  . Stroke  . Dyspnea

## 2012-02-15 NOTE — Assessment & Plan Note (Signed)
Blood pressure control. 

## 2012-02-15 NOTE — Assessment & Plan Note (Signed)
Followed by neurology.   

## 2012-02-15 NOTE — Assessment & Plan Note (Signed)
followup primary care physician. 

## 2012-02-15 NOTE — Assessment & Plan Note (Signed)
Evaluated [facility].  Reportedly stable

## 2012-02-17 DIAGNOSIS — S1093XA Contusion of unspecified part of neck, initial encounter: Secondary | ICD-10-CM | POA: Diagnosis not present

## 2012-02-17 DIAGNOSIS — S0100XA Unspecified open wound of scalp, initial encounter: Secondary | ICD-10-CM | POA: Diagnosis not present

## 2012-02-17 DIAGNOSIS — S0003XA Contusion of scalp, initial encounter: Secondary | ICD-10-CM | POA: Diagnosis not present

## 2012-02-17 DIAGNOSIS — S0990XA Unspecified injury of head, initial encounter: Secondary | ICD-10-CM | POA: Diagnosis not present

## 2012-02-17 DIAGNOSIS — S0180XA Unspecified open wound of other part of head, initial encounter: Secondary | ICD-10-CM | POA: Diagnosis not present

## 2012-02-17 DIAGNOSIS — S0083XA Contusion of other part of head, initial encounter: Secondary | ICD-10-CM | POA: Diagnosis not present

## 2012-03-02 DIAGNOSIS — I4891 Unspecified atrial fibrillation: Secondary | ICD-10-CM | POA: Diagnosis not present

## 2012-03-19 ENCOUNTER — Other Ambulatory Visit: Payer: Self-pay | Admitting: Cardiology

## 2012-03-23 ENCOUNTER — Ambulatory Visit: Payer: Self-pay | Admitting: *Deleted

## 2012-03-23 DIAGNOSIS — I4891 Unspecified atrial fibrillation: Secondary | ICD-10-CM | POA: Diagnosis not present

## 2012-03-23 DIAGNOSIS — I639 Cerebral infarction, unspecified: Secondary | ICD-10-CM

## 2012-03-23 DIAGNOSIS — Z7901 Long term (current) use of anticoagulants: Secondary | ICD-10-CM

## 2012-03-30 DIAGNOSIS — I4891 Unspecified atrial fibrillation: Secondary | ICD-10-CM | POA: Diagnosis not present

## 2012-04-06 DIAGNOSIS — R4701 Aphasia: Secondary | ICD-10-CM | POA: Diagnosis not present

## 2012-04-06 DIAGNOSIS — I4891 Unspecified atrial fibrillation: Secondary | ICD-10-CM | POA: Diagnosis not present

## 2012-04-06 DIAGNOSIS — I6350 Cerebral infarction due to unspecified occlusion or stenosis of unspecified cerebral artery: Secondary | ICD-10-CM | POA: Diagnosis not present

## 2012-04-13 DIAGNOSIS — I4891 Unspecified atrial fibrillation: Secondary | ICD-10-CM | POA: Diagnosis not present

## 2012-04-14 DIAGNOSIS — Z85828 Personal history of other malignant neoplasm of skin: Secondary | ICD-10-CM | POA: Diagnosis not present

## 2012-04-14 DIAGNOSIS — L570 Actinic keratosis: Secondary | ICD-10-CM | POA: Diagnosis not present

## 2012-04-17 DIAGNOSIS — E7800 Pure hypercholesterolemia, unspecified: Secondary | ICD-10-CM | POA: Diagnosis not present

## 2012-04-17 DIAGNOSIS — E119 Type 2 diabetes mellitus without complications: Secondary | ICD-10-CM | POA: Diagnosis not present

## 2012-04-23 DIAGNOSIS — M810 Age-related osteoporosis without current pathological fracture: Secondary | ICD-10-CM | POA: Diagnosis not present

## 2012-04-23 DIAGNOSIS — I6789 Other cerebrovascular disease: Secondary | ICD-10-CM | POA: Diagnosis not present

## 2012-04-23 DIAGNOSIS — IMO0002 Reserved for concepts with insufficient information to code with codable children: Secondary | ICD-10-CM | POA: Diagnosis not present

## 2012-04-23 DIAGNOSIS — E782 Mixed hyperlipidemia: Secondary | ICD-10-CM | POA: Diagnosis not present

## 2012-04-23 DIAGNOSIS — I10 Essential (primary) hypertension: Secondary | ICD-10-CM | POA: Diagnosis not present

## 2012-04-23 DIAGNOSIS — K5730 Diverticulosis of large intestine without perforation or abscess without bleeding: Secondary | ICD-10-CM | POA: Diagnosis not present

## 2012-04-23 DIAGNOSIS — I4891 Unspecified atrial fibrillation: Secondary | ICD-10-CM | POA: Diagnosis not present

## 2012-04-23 DIAGNOSIS — M1990 Unspecified osteoarthritis, unspecified site: Secondary | ICD-10-CM | POA: Diagnosis not present

## 2012-05-14 DIAGNOSIS — I4891 Unspecified atrial fibrillation: Secondary | ICD-10-CM | POA: Diagnosis not present

## 2012-05-16 DIAGNOSIS — Z23 Encounter for immunization: Secondary | ICD-10-CM | POA: Diagnosis not present

## 2012-06-16 DIAGNOSIS — I4891 Unspecified atrial fibrillation: Secondary | ICD-10-CM | POA: Diagnosis not present

## 2012-07-01 DIAGNOSIS — I6350 Cerebral infarction due to unspecified occlusion or stenosis of unspecified cerebral artery: Secondary | ICD-10-CM | POA: Diagnosis not present

## 2012-07-20 DIAGNOSIS — I4891 Unspecified atrial fibrillation: Secondary | ICD-10-CM | POA: Diagnosis not present

## 2012-08-17 DIAGNOSIS — I10 Essential (primary) hypertension: Secondary | ICD-10-CM | POA: Diagnosis not present

## 2012-08-17 DIAGNOSIS — E119 Type 2 diabetes mellitus without complications: Secondary | ICD-10-CM | POA: Diagnosis not present

## 2012-08-17 DIAGNOSIS — E782 Mixed hyperlipidemia: Secondary | ICD-10-CM | POA: Diagnosis not present

## 2012-08-17 DIAGNOSIS — IMO0002 Reserved for concepts with insufficient information to code with codable children: Secondary | ICD-10-CM | POA: Diagnosis not present

## 2012-08-17 DIAGNOSIS — E7800 Pure hypercholesterolemia, unspecified: Secondary | ICD-10-CM | POA: Diagnosis not present

## 2012-08-21 DIAGNOSIS — I6789 Other cerebrovascular disease: Secondary | ICD-10-CM | POA: Diagnosis not present

## 2012-08-21 DIAGNOSIS — M545 Low back pain, unspecified: Secondary | ICD-10-CM | POA: Diagnosis not present

## 2012-08-21 DIAGNOSIS — E782 Mixed hyperlipidemia: Secondary | ICD-10-CM | POA: Diagnosis not present

## 2012-08-21 DIAGNOSIS — I509 Heart failure, unspecified: Secondary | ICD-10-CM | POA: Diagnosis not present

## 2012-08-21 DIAGNOSIS — R5383 Other fatigue: Secondary | ICD-10-CM | POA: Diagnosis not present

## 2012-08-21 DIAGNOSIS — I4891 Unspecified atrial fibrillation: Secondary | ICD-10-CM | POA: Diagnosis not present

## 2012-08-21 DIAGNOSIS — R5381 Other malaise: Secondary | ICD-10-CM | POA: Diagnosis not present

## 2012-08-21 DIAGNOSIS — I10 Essential (primary) hypertension: Secondary | ICD-10-CM | POA: Diagnosis not present

## 2012-08-24 ENCOUNTER — Ambulatory Visit (INDEPENDENT_AMBULATORY_CARE_PROVIDER_SITE_OTHER): Payer: Medicare Other | Admitting: Cardiology

## 2012-08-24 ENCOUNTER — Encounter: Payer: Self-pay | Admitting: Cardiology

## 2012-08-24 VITALS — BP 116/64 | HR 64 | Ht 66.0 in | Wt 164.0 lb

## 2012-08-24 DIAGNOSIS — I4891 Unspecified atrial fibrillation: Secondary | ICD-10-CM

## 2012-08-24 NOTE — Patient Instructions (Addendum)

## 2012-08-24 NOTE — Progress Notes (Signed)
HPI The patient presents for evaluation of PAF and diastolic heart failure.  Since she was last seen in this office she has done well. Her warfarin is now being followed by Dr. Reuel Boom. She was hospitalized last year with an apparent embolic stroke. This did not leave her with weakness though she has some numbness. She is in atrial fibrillation which she thinks is paroxysmal. She may notice it helping occasionally. She does not describe presyncope or syncope. She does not have chest pressure, neck discomfort. She does not have shortness of breath, PND or orthopnea. She's not having any weight gain or edema. She does occasionally get some arm discomfort. She describes a "lifeless feeling" but cannot quantify or qualify this further. She thinks this discomfort might be new compared to previous but it is sporadic and not reproducible. It is not associated with other symptoms. It seems to be somewhat mild and fleeting.  Allergies  Allergen Reactions  . Sulfonamide Derivatives     Current Outpatient Prescriptions  Medication Sig Dispense Refill  . atenolol (TENORMIN) 50 MG tablet Take 50 mg by mouth daily.        Marland Kitchen atorvastatin (LIPITOR) 20 MG tablet Take 20 mg by mouth daily.      . Biotin 1000 MCG tablet Take 1,000 mcg by mouth daily.      . Calcium Carbonate-Vitamin D (CALTRATE 600+D) 600-400 MG-UNIT per tablet Take 1 tablet by mouth daily.        . citalopram (CELEXA) 20 MG tablet Take 20 mg by mouth daily.      Marland Kitchen diltiazem (CARDIZEM CD) 240 MG 24 hr capsule Take 240 mg by mouth daily.      Marland Kitchen losartan (COZAAR) 100 MG tablet Take 100 mg by mouth daily.      . predniSONE (DELTASONE) 1 MG tablet Take 4 mg by mouth daily.      Marland Kitchen warfarin (COUMADIN) 4 MG tablet Take 1 tablet (4 mg total) by mouth daily.  20 tablet  0    Past Medical History  Diagnosis Date  . Polymyalgia rheumatica   . Stroke   . Hypertension   . Hyperlipidemia   . Arrhythmia     Atrial fibrillation  . Coronary artery  disease      nonobstructive coronary artery diseas coronary artery disease by. Preserved LV function.   Marland Kitchen Permanent atrial fibrillation     On Coumadin.  Marland Kitchen Cough     Upper airway cough syndrome tolerating ACE inhibitor.  . Carotid bruit      no significant disease by carotid Dopplers  . Abnormal EKG      no coronary artery disease by catheterization and no change from prior tracing     Past Surgical History  Procedure Date  . Total abdominal hysterectomy   . Appendectomy     ROS:  As stated in the HPI and negative for all other systems.  PHYSICAL EXAM BP 116/64  Pulse 64  Ht 5\' 6"  (1.676 m)  Wt 164 lb (74.39 kg)  BMI 26.47 kg/m2  SpO2 99% GENERAL:  Well appearing HEENT:  Pupils equal round and reactive, fundi not visualized, oral mucosa unremarkable NECK:  No jugular venous distention, waveform within normal limits, carotid upstroke brisk and symmetric, right carotid bruits, no thyromegaly LYMPHATICS:  No cervical, inguinal adenopathy LUNGS:  Clear to auscultation bilaterally BACK:  No CVA tenderness CHEST:  Unremarkable HEART:  PMI not displaced or sustained,S1 and S2 within normal limits, no S3, no clicks,  no rubs, no murmurs, irregular ABD:  Flat, positive bowel sounds normal in frequency in pitch, no bruits, no rebound, no guarding, no midline pulsatile mass, no hepatomegaly, no splenomegaly EXT:  2 plus pulses throughout, mild left ankle edema, no cyanosis no clubbing SKIN:  No rashes no nodules NEURO:  Cranial nerves II through XII grossly intact, motor grossly intact throughout PSYCH:  Cognitively intact, oriented to person place and time  EKG:  Atrial fibrillation, rate 59 axis within normal limits, intervals within normal limits, nonspecific anterolateral ST depression, poor anterior R wave progression cannot exclude old anteroseptal infarct. 08/24/2012  ASSESSMENT AND PLAN  Chest pain Her pain is atypical. She does have musculoskeletal complaints and I think this  is most likely the etiology. I did review her previous perfusion study last year and her EF was 72%. There was no evidence of ischemia or infarct. No further change in therapy is indicated. She will continue with risk reduction.  Paroxysmal atrial fibrillation  Patient's CHADS2 score is 5. The patient has at least a 10% risk per year for recurrent stroke. She will need to continue on Coumadin indefinitely. No other change in therapy is indicated.   CAROTID BRUIT, RIGHT  She had no significant carotid plaque on Doppler last year. No further evaluation is indicated.  HYPERTENSION  Her blood pressure is well controlled. She will continue the medications as listed.  Diastolic heart failure The patient did have a previous history of this but seems to be euvolemic. No change in therapy is indicated.

## 2012-09-07 DIAGNOSIS — I4891 Unspecified atrial fibrillation: Secondary | ICD-10-CM | POA: Diagnosis not present

## 2012-09-07 DIAGNOSIS — E782 Mixed hyperlipidemia: Secondary | ICD-10-CM | POA: Diagnosis not present

## 2012-09-07 DIAGNOSIS — I10 Essential (primary) hypertension: Secondary | ICD-10-CM | POA: Diagnosis not present

## 2012-09-07 DIAGNOSIS — R5383 Other fatigue: Secondary | ICD-10-CM | POA: Diagnosis not present

## 2012-09-07 DIAGNOSIS — R42 Dizziness and giddiness: Secondary | ICD-10-CM | POA: Diagnosis not present

## 2012-09-07 DIAGNOSIS — R5381 Other malaise: Secondary | ICD-10-CM | POA: Diagnosis not present

## 2012-09-07 DIAGNOSIS — R071 Chest pain on breathing: Secondary | ICD-10-CM | POA: Diagnosis not present

## 2012-09-07 DIAGNOSIS — R300 Dysuria: Secondary | ICD-10-CM | POA: Diagnosis not present

## 2012-09-15 DIAGNOSIS — S40019A Contusion of unspecified shoulder, initial encounter: Secondary | ICD-10-CM | POA: Diagnosis not present

## 2012-09-18 DIAGNOSIS — I4891 Unspecified atrial fibrillation: Secondary | ICD-10-CM | POA: Diagnosis not present

## 2012-10-14 DIAGNOSIS — D485 Neoplasm of uncertain behavior of skin: Secondary | ICD-10-CM | POA: Diagnosis not present

## 2012-10-14 DIAGNOSIS — L570 Actinic keratosis: Secondary | ICD-10-CM | POA: Diagnosis not present

## 2012-10-14 DIAGNOSIS — Z85828 Personal history of other malignant neoplasm of skin: Secondary | ICD-10-CM | POA: Diagnosis not present

## 2012-10-19 DIAGNOSIS — I4891 Unspecified atrial fibrillation: Secondary | ICD-10-CM | POA: Diagnosis not present

## 2012-11-19 DIAGNOSIS — I4891 Unspecified atrial fibrillation: Secondary | ICD-10-CM | POA: Diagnosis not present

## 2012-11-26 DIAGNOSIS — Z1231 Encounter for screening mammogram for malignant neoplasm of breast: Secondary | ICD-10-CM | POA: Diagnosis not present

## 2012-12-11 DIAGNOSIS — N6489 Other specified disorders of breast: Secondary | ICD-10-CM | POA: Diagnosis not present

## 2012-12-11 DIAGNOSIS — J309 Allergic rhinitis, unspecified: Secondary | ICD-10-CM | POA: Diagnosis not present

## 2012-12-11 DIAGNOSIS — N644 Mastodynia: Secondary | ICD-10-CM | POA: Diagnosis not present

## 2012-12-22 DIAGNOSIS — N644 Mastodynia: Secondary | ICD-10-CM | POA: Diagnosis not present

## 2012-12-22 DIAGNOSIS — R928 Other abnormal and inconclusive findings on diagnostic imaging of breast: Secondary | ICD-10-CM | POA: Diagnosis not present

## 2012-12-31 DIAGNOSIS — R5381 Other malaise: Secondary | ICD-10-CM | POA: Diagnosis not present

## 2012-12-31 DIAGNOSIS — E782 Mixed hyperlipidemia: Secondary | ICD-10-CM | POA: Diagnosis not present

## 2012-12-31 DIAGNOSIS — M545 Low back pain, unspecified: Secondary | ICD-10-CM | POA: Diagnosis not present

## 2012-12-31 DIAGNOSIS — I509 Heart failure, unspecified: Secondary | ICD-10-CM | POA: Diagnosis not present

## 2012-12-31 DIAGNOSIS — I10 Essential (primary) hypertension: Secondary | ICD-10-CM | POA: Diagnosis not present

## 2012-12-31 DIAGNOSIS — R5383 Other fatigue: Secondary | ICD-10-CM | POA: Diagnosis not present

## 2012-12-31 DIAGNOSIS — I4891 Unspecified atrial fibrillation: Secondary | ICD-10-CM | POA: Diagnosis not present

## 2012-12-31 DIAGNOSIS — I6789 Other cerebrovascular disease: Secondary | ICD-10-CM | POA: Diagnosis not present

## 2013-01-05 ENCOUNTER — Encounter: Payer: Self-pay | Admitting: Neurology

## 2013-01-05 DIAGNOSIS — I6350 Cerebral infarction due to unspecified occlusion or stenosis of unspecified cerebral artery: Secondary | ICD-10-CM | POA: Insufficient documentation

## 2013-01-05 DIAGNOSIS — I4891 Unspecified atrial fibrillation: Secondary | ICD-10-CM | POA: Insufficient documentation

## 2013-01-05 DIAGNOSIS — R4701 Aphasia: Secondary | ICD-10-CM

## 2013-01-06 ENCOUNTER — Encounter: Payer: Self-pay | Admitting: Neurology

## 2013-01-06 ENCOUNTER — Ambulatory Visit (INDEPENDENT_AMBULATORY_CARE_PROVIDER_SITE_OTHER): Payer: Medicare Other | Admitting: Neurology

## 2013-01-06 VITALS — BP 178/72 | HR 74 | Temp 97.7°F | Ht 67.5 in | Wt 166.0 lb

## 2013-01-06 DIAGNOSIS — R51 Headache: Secondary | ICD-10-CM | POA: Diagnosis not present

## 2013-01-06 NOTE — Progress Notes (Signed)
[facility] [address] [place]. [place] [address] [phone]       OFFICE FOLLOW-UP NOTE  [name] Date of Birth:  [birth year] Medical Record Number:  [id number]   HPI:79 year old lady with left MCA branch infarct in May 2013 from atrial fibrillation  She returns for followup after last visit on 07/01/12. She continues to do well and has not had any recurrence stroke or TIA symptoms. She remains on warfarin which is tolerating well with only minor bruising and no significant bleeding episodes. She states her blood pressure is under good control usually though it is elevated at 178/72 in office today. She is not aware when her last lipid profile was checked but apparently it was okay. She complains of posterior neck and occipital headaches which are intermittent and occur on a daily basis for the last 2-3 weeks. They last usually a few hours and Tylenol provides good relief. The pain is usually pressure-like though it has a sharp component to it also he she also admits to type feeling in the neck muscles. She denies any recent fall head injury . ROS:   14 system review of systems is positive for hearing loss, snoring, headache and neck pain PMH:  Past Medical History  Diagnosis Date  . Polymyalgia rheumatica   . Stroke   . Hypertension   . Hyperlipidemia   . Atrial fibrillation   . Coronary artery disease      nonobstructive coronary artery diseas coronary artery disease by. Preserved LV function.   Marland Kitchen Permanent atrial fibrillation     On Coumadin.  Marland Kitchen Cough     Upper airway cough syndrome tolerating ACE inhibitor.  . Carotid bruit      no significant disease by carotid Dopplers    Social History:  History   Social History  . Marital Status: Married    Spouse Name: N/A    Number of Children: 4  . Years of Education: 14   Occupational History  . Retired    Social History Main Topics  . Smoking status: Never Smoker   . Smokeless tobacco: Never  Used  . Alcohol Use: No  . Drug Use: No  . Sexually Active: Not on file   Other Topics Concern  . Not on file   Social History Narrative  . No narrative on file    Medications:   Current Outpatient Prescriptions on File Prior to Visit  Medication Sig Dispense Refill  . atenolol (TENORMIN) 50 MG tablet Take 50 mg by mouth daily.        Marland Kitchen atorvastatin (LIPITOR) 20 MG tablet Take 20 mg by mouth daily.      . Biotin 1000 MCG tablet Take 1,000 mcg by mouth daily.      . Calcium Carbonate-Vitamin D (CALTRATE 600+D) 600-400 MG-UNIT per tablet Take 1 tablet by mouth daily.        . citalopram (CELEXA) 20 MG tablet Take 20 mg by mouth daily.      Marland Kitchen diltiazem (CARDIZEM CD) 240 MG 24 hr capsule Take 240 mg by mouth daily.      Marland Kitchen losartan (COZAAR) 100 MG tablet Take 100 mg by mouth daily.      . predniSONE (DELTASONE) 1 MG tablet Take 4 mg by mouth daily.       No current facility-administered medications on file prior to visit.    Allergies:   Allergies  Allergen Reactions  . Sulfonamide Derivatives     Physical Exam  General: well developed, well nourished, seated, in no evident distress Head: head normocephalic and atraumatic. Orohparynx benign Neck: supple with no carotid or supraclavicular bruits Cardiovascular: regular rate and rhythm, no murmurs Musculoskeletal: no deformity Skin:  no rash/petichiae Vascular:  Normal pulses all extremities Filed Vitals:   01/06/13 1540  BP: 178/72  Pulse: 74  Temp: 97.7 F (36.5 C)    Neurologic Exam Mental Status: Awake and fully alert. Oriented to place and time. Recent and remote memory intact. Attention span, concentration and fund of knowledge appropriate. Mood and affect appropriate.  Cranial Nerves: Fundoscopic exam reveals sharp disc margins. Pupils equal, briskly reactive to light. Extraocular movements full without nystagmus. Visual fields full to confrontation. Hearing intact. Facial sensation intact. Face, tongue, palate moves  normally and symmetrically.  Motor: Normal bulk and tone. Normal strength in all tested extremity muscles. Sensory.: intact to tough and pinprick and vibratory.  Coordination: Rapid alternating movements normal in all extremities. Finger-to-nose and heel-to-shin performed accurately bilaterally. Gait and Station: Arises from chair without difficulty. Stance is normal. Gait demonstrates normal stride length and balance . Not able to heel, toe and tandem walk without difficulty.  Reflexes: 1+ and symmetric. Toes downgoing.     ASSESSMENT:  79 year old lady with left MCA branch infarct in May 2013 from atrial fibrillation continues to do well on anticoagulation with warfarin.  New onset of occipital headaches probably muscle tension headaches but need to evaluate for intracranial hemorrhage given the fact that she is on warfarin   PLAN: Check MRI scan of the brain with and without. Continue to use Tylenol for symptomatically for the headache. Continue warfarin for second stroke prevention along with strict control of hypertension with blood pressure goal below 130/90 and lipids with LDL cholesterol goal below 100 mg percent. Return for followup in 3 months with Larita Fife, NP

## 2013-01-06 NOTE — Patient Instructions (Signed)
And check MRI scan the brain with and without for new onset headaches. Continue to use Tylenol as needed for headaches and 2 next itching exercises. Continue warfarin for stroke prevention and strict control of hypertension with blood pressure goal below 130/90 and lipids with LDL cholesterol goal below 100 mg percent. Return for followup in 3 months with Larita Fife, NP

## 2013-01-07 ENCOUNTER — Other Ambulatory Visit: Payer: Self-pay | Admitting: Neurology

## 2013-01-07 DIAGNOSIS — N189 Chronic kidney disease, unspecified: Secondary | ICD-10-CM | POA: Diagnosis not present

## 2013-01-07 DIAGNOSIS — R51 Headache: Secondary | ICD-10-CM

## 2013-01-07 DIAGNOSIS — R071 Chest pain on breathing: Secondary | ICD-10-CM | POA: Diagnosis not present

## 2013-01-07 DIAGNOSIS — R5383 Other fatigue: Secondary | ICD-10-CM | POA: Diagnosis not present

## 2013-01-07 DIAGNOSIS — I10 Essential (primary) hypertension: Secondary | ICD-10-CM | POA: Diagnosis not present

## 2013-01-07 DIAGNOSIS — I4891 Unspecified atrial fibrillation: Secondary | ICD-10-CM | POA: Diagnosis not present

## 2013-01-07 DIAGNOSIS — R5381 Other malaise: Secondary | ICD-10-CM | POA: Diagnosis not present

## 2013-01-07 DIAGNOSIS — E782 Mixed hyperlipidemia: Secondary | ICD-10-CM | POA: Diagnosis not present

## 2013-01-14 ENCOUNTER — Ambulatory Visit
Admission: RE | Admit: 2013-01-14 | Discharge: 2013-01-14 | Disposition: A | Discharge status: Home / Self Care | Payer: Medicare Other | Source: Ambulatory Visit | Attending: Neurology | Admitting: Neurology

## 2013-01-14 DIAGNOSIS — R51 Headache: Secondary | ICD-10-CM

## 2013-01-14 MED ORDER — GADOBENATE DIMEGLUMINE 529 MG/ML IV SOLN
8.0000 mL | Freq: Once | INTRAVENOUS | Status: AC | PRN
  Administered 2013-01-14: 8 mL via INTRAVENOUS

## 2013-01-15 ENCOUNTER — Telehealth: Payer: Self-pay | Admitting: Neurology

## 2013-01-18 ENCOUNTER — Telehealth: Payer: Self-pay | Admitting: Nurse Practitioner

## 2013-01-18 NOTE — Telephone Encounter (Signed)
I called and spoke with the patient concerning her MRI result wasn't in as of yet and she will receive a call letting her know the results.

## 2013-01-18 NOTE — Telephone Encounter (Signed)
Patient is requesting someone call and give her MRI results.

## 2013-01-20 ENCOUNTER — Telehealth: Payer: Self-pay | Admitting: Neurology

## 2013-01-20 NOTE — Telephone Encounter (Signed)
I called pt and gave her the results of MRI brain.  Evolutionary changes from remote age strokes (some atrophy and SVD).  She verbalized understanding.

## 2013-01-20 NOTE — Telephone Encounter (Signed)
Patient is calling to see if her MRI results are ready yet.  She tells me she's going out of town tomorrow and would really like to know her results today if at all possible.  Please try her home phone first, then her cell.

## 2013-02-04 DIAGNOSIS — S065X9A Traumatic subdural hemorrhage with loss of consciousness of unspecified duration, initial encounter: Secondary | ICD-10-CM | POA: Diagnosis not present

## 2013-02-04 DIAGNOSIS — R55 Syncope and collapse: Secondary | ICD-10-CM | POA: Diagnosis not present

## 2013-02-04 DIAGNOSIS — I4891 Unspecified atrial fibrillation: Secondary | ICD-10-CM | POA: Diagnosis not present

## 2013-02-09 DIAGNOSIS — I4891 Unspecified atrial fibrillation: Secondary | ICD-10-CM | POA: Diagnosis not present

## 2013-02-15 DIAGNOSIS — I4891 Unspecified atrial fibrillation: Secondary | ICD-10-CM | POA: Diagnosis not present

## 2013-02-25 DIAGNOSIS — I4891 Unspecified atrial fibrillation: Secondary | ICD-10-CM | POA: Diagnosis not present

## 2013-03-03 ENCOUNTER — Telehealth: Payer: Self-pay | Admitting: Neurology

## 2013-03-18 DIAGNOSIS — I6789 Other cerebrovascular disease: Secondary | ICD-10-CM | POA: Diagnosis not present

## 2013-04-06 DIAGNOSIS — S0100XA Unspecified open wound of scalp, initial encounter: Secondary | ICD-10-CM | POA: Diagnosis not present

## 2013-04-06 DIAGNOSIS — T148XXA Other injury of unspecified body region, initial encounter: Secondary | ICD-10-CM | POA: Diagnosis not present

## 2013-04-06 DIAGNOSIS — Z8673 Personal history of transient ischemic attack (TIA), and cerebral infarction without residual deficits: Secondary | ICD-10-CM | POA: Diagnosis not present

## 2013-04-06 DIAGNOSIS — M549 Dorsalgia, unspecified: Secondary | ICD-10-CM | POA: Diagnosis not present

## 2013-04-06 DIAGNOSIS — M25519 Pain in unspecified shoulder: Secondary | ICD-10-CM | POA: Diagnosis not present

## 2013-04-06 DIAGNOSIS — I4891 Unspecified atrial fibrillation: Secondary | ICD-10-CM | POA: Diagnosis not present

## 2013-04-06 DIAGNOSIS — R51 Headache: Secondary | ICD-10-CM | POA: Diagnosis not present

## 2013-04-06 DIAGNOSIS — M542 Cervicalgia: Secondary | ICD-10-CM | POA: Diagnosis not present

## 2013-04-06 DIAGNOSIS — I10 Essential (primary) hypertension: Secondary | ICD-10-CM | POA: Diagnosis not present

## 2013-04-06 DIAGNOSIS — Z0489 Encounter for examination and observation for other specified reasons: Secondary | ICD-10-CM | POA: Diagnosis not present

## 2013-04-06 DIAGNOSIS — R110 Nausea: Secondary | ICD-10-CM | POA: Diagnosis not present

## 2013-04-06 DIAGNOSIS — Z7901 Long term (current) use of anticoagulants: Secondary | ICD-10-CM | POA: Diagnosis not present

## 2013-04-06 DIAGNOSIS — S0003XA Contusion of scalp, initial encounter: Secondary | ICD-10-CM | POA: Diagnosis not present

## 2013-04-08 ENCOUNTER — Ambulatory Visit: Payer: Medicare Other | Admitting: Nurse Practitioner

## 2013-04-13 DIAGNOSIS — D235 Other benign neoplasm of skin of trunk: Secondary | ICD-10-CM | POA: Diagnosis not present

## 2013-04-13 DIAGNOSIS — Z85828 Personal history of other malignant neoplasm of skin: Secondary | ICD-10-CM | POA: Diagnosis not present

## 2013-04-13 DIAGNOSIS — L570 Actinic keratosis: Secondary | ICD-10-CM | POA: Diagnosis not present

## 2013-04-13 DIAGNOSIS — D485 Neoplasm of uncertain behavior of skin: Secondary | ICD-10-CM | POA: Diagnosis not present

## 2013-04-16 ENCOUNTER — Ambulatory Visit (INDEPENDENT_AMBULATORY_CARE_PROVIDER_SITE_OTHER): Payer: Medicare Other | Admitting: Nurse Practitioner

## 2013-04-16 ENCOUNTER — Encounter: Payer: Self-pay | Admitting: Nurse Practitioner

## 2013-04-16 VITALS — BP 111/58 | HR 46 | Temp 96.6°F

## 2013-04-16 DIAGNOSIS — I4891 Unspecified atrial fibrillation: Secondary | ICD-10-CM

## 2013-04-16 DIAGNOSIS — R42 Dizziness and giddiness: Secondary | ICD-10-CM

## 2013-04-16 DIAGNOSIS — I498 Other specified cardiac arrhythmias: Secondary | ICD-10-CM | POA: Diagnosis not present

## 2013-04-16 DIAGNOSIS — I6350 Cerebral infarction due to unspecified occlusion or stenosis of unspecified cerebral artery: Secondary | ICD-10-CM | POA: Diagnosis not present

## 2013-04-16 DIAGNOSIS — I639 Cerebral infarction, unspecified: Secondary | ICD-10-CM

## 2013-04-16 DIAGNOSIS — I6789 Other cerebrovascular disease: Secondary | ICD-10-CM | POA: Diagnosis not present

## 2013-04-16 DIAGNOSIS — R51 Headache: Secondary | ICD-10-CM | POA: Diagnosis not present

## 2013-04-16 DIAGNOSIS — R001 Bradycardia, unspecified: Secondary | ICD-10-CM

## 2013-04-16 NOTE — Patient Instructions (Addendum)
Monitor your heart rate and blood pressure over the weekend. If you feel dizzy and heart rate continues to be low, please seek medical attention.  Continue warfarin  for secondary stroke prevention and maintain strict control of hypertension with blood pressure goal below 130/90, diabetes with hemoglobin A1c goal below 6.5% and lipids with LDL cholesterol goal below 100 mg/dL.   Followup in the future with me in 6 months.

## 2013-04-16 NOTE — Progress Notes (Signed)
[facility]  PATIENT: [name] [name] DOB: May 13, 1930   HISTORY FROM: patient, chart REASON FOR VISIT: follow up   HISTORICAL  CHIEF COMPLAINT:  Chief Complaint  Patient presents with  . Follow-up    stroke follow up, headache follow up, light headed, diarrhea    HISTORY OF PRESENT ILLNESS: 79 year old lady with left MCA branch infarct in May 2013 from atrial fibrillation  She returns for followup after last visit on 07/01/12. She continues to do well and has not had any recurrence stroke or TIA symptoms. She remains on warfarin which is tolerating well with only minor bruising and no significant bleeding episodes. She states her blood pressure is under good control usually though it is elevated at 178/72 in office today. She is not aware when her last lipid profile was checked but apparently it was okay. She complains of posterior neck and occipital headaches which are intermittent and occur on a daily basis for the last 2-3 weeks. They last usually a few hours and Tylenol provides good relief. The pain is usually pressure-like though it has a sharp component to it also he she also admits to type feeling in the neck muscles. She denies any recent fall head injury.  UPDATE 04/16/13 (LL):  Since last visit on 01/06/13, headaches are better, occasional left parietal headache.  States had a fall in June,  electricity was out, knocked unconscious, walked into wall.  Went to [facility]; CT showed bleeding subdural; had confusion, did not feel over it until first of August.  They were camping 2 weeks ago, neighbor dog was loose, attacked her dog and she and her husband fell trying to protect their dog.  She had bump on head and small wound, but it stopped bleeding, went to [facility] to get checked, CT was negative. Has been feeling a little dizzy today.  Comadin checked this morning, PT was 2.6.  HR in our office today is 46 at check-in and 48 during exam.  She states this is  not her usual.  Denies CP, nausea, any pain.  She states she does check her blood pressure at home and heart rate is usually in the 60s-70.  Does not have cardiologist since Dr. Andee Lineman left [place].  Dr. Reuel Boom, her PCP handles all of her care.  No neurovascular symptoms.   REVIEW OF SYSTEMS: Full 14 system review of systems performed and notable only for:  Constitutional: N/A  Cardiovascular: N/A  Ear/Nose/Throat: N/A  Skin: N/A  Eyes: N/A  Respiratory: N/A  Gastroitestinal: diarrhea (chronic), PCP aware Hematology/Lymphatic: N/A  Endocrine: N/A Musculoskeletal:N/A  Allergy/Immunology: N/A  Neurological: N/A Psychiatric: N/A   ALLERGIES: Allergies  Allergen Reactions  . Sulfonamide Derivatives     HOME MEDICATIONS: Outpatient Prescriptions Prior to Visit  Medication Sig Dispense Refill  . atenolol (TENORMIN) 50 MG tablet Take 50 mg by mouth daily.        Marland Kitchen atorvastatin (LIPITOR) 20 MG tablet Take 20 mg by mouth daily.      . Biotin 1000 MCG tablet Take 1,000 mcg by mouth daily.      . Calcium Carbonate-Vitamin D (CALTRATE 600+D) 600-400 MG-UNIT per tablet Take 1 tablet by mouth daily.        . citalopram (CELEXA) 20 MG tablet Take 20 mg by mouth daily.      Marland Kitchen diltiazem (CARDIZEM CD) 240 MG 24 hr capsule Take 240 mg by mouth daily.      Marland Kitchen losartan (COZAAR) 100 MG tablet Take  100 mg by mouth daily.      . predniSONE (DELTASONE) 1 MG tablet Take 4 mg by mouth daily.      Marland Kitchen warfarin (COUMADIN) 4 MG tablet Take 4 mg by mouth daily.       No facility-administered medications prior to visit.    PAST MEDICAL HISTORY: Past Medical History  Diagnosis Date  . Polymyalgia rheumatica   . Stroke   . Hypertension   . Hyperlipidemia   . Atrial fibrillation   . Coronary artery disease      nonobstructive coronary artery diseas coronary artery disease by. Preserved LV function.   Marland Kitchen Permanent atrial fibrillation     On Coumadin.  Marland Kitchen Cough     Upper airway cough syndrome tolerating ACE  inhibitor.  . Carotid bruit      no significant disease by carotid Dopplers    PAST SURGICAL HISTORY: Past Surgical History  Procedure Laterality Date  . Total abdominal hysterectomy    . Appendectomy    . Left ankle     . Toe surgery      FAMILY HISTORY: Family History  Problem Relation Age of Onset  . Coronary artery disease Other   . Stroke Mother     SOCIAL HISTORY: History   Social History  . Marital Status: Married    Spouse Name: N/A    Number of Children: 4  . Years of Education: 14   Occupational History  . Retired    Social History Main Topics  . Smoking status: Never Smoker   . Smokeless tobacco: Never Used  . Alcohol Use: No  . Drug Use: No  . Sexual Activity: Not on file   Other Topics Concern  . Not on file   Social History Narrative  . No narrative on file   PHYSICAL EXAM  Filed Vitals:   04/16/13 1436  BP: 111/58  Pulse: 46  Temp: 96.6 F (35.9 C)   There is no weight on file to calculate BMI.  Physical Exam  General: well developed, well nourished, seated, in no evident distress  Head: head normocephalic and atraumatic. Orohparynx benign  Neck: supple with soft carotid bruit on right Cardiovascular:bradycardic rate and irregular rhythm, no murmurs  Musculoskeletal: no deformity  Skin: no rash/petichiae  Vascular: Normal pulses all extremities Neurologic Exam  Mental Status: Awake and fully alert. Oriented to place and time. Recent and remote memory intact. Attention span, concentration and fund of knowledge appropriate. Mood and affect appropriate.  Cranial Nerves: Pupils equal, briskly reactive to light. Extraocular movements full without nystagmus. Visual fields full to confrontation. Hearing intact. Facial sensation intact. Face, tongue, palate moves normally and symmetrically.  Motor: Normal bulk and tone. Normal strength in all tested extremities, MILD WEAKER RIGHT HAND GRIP. Sensory.: intact to tough and pinprick.    Coordination: Rapid alternating movements normal in all extremities. Finger-to-nose and heel-to-shin performed accurately bilaterally.  Gait and Station: Arises from chair without difficulty. Stance is normal. Gait demonstrates normal stride length and balance . Not able to heel, toe and tandem walk without difficulty. Romberg negative. Reflexes: 1+ and symmetric.   DIAGNOSTIC DATA (LABS, IMAGING, TESTING) - I reviewed patient records, labs, notes, testing and imaging myself where available.  MRI brain with and without 01/14/13 Abnormal MRI scan of the brain showing remote age left posterior frontal and periopercular infarcts and mild changes of chronic microvascular ischemia and generalized cerebral atrophy. Overall expected evolutionary changes compared with previous MRI scan dated 12/24/2011.  ASSESSMENT  AND PLAN 79 year old lady with left MCA branch infarct in May 2013 from atrial fibrillation continues to do well on anticoagulation with warfarin.  Found to be bradycardic in office but not symptomatic.  PLAN:  Continue warfarin for second stroke prevention along with strict control of hypertension with blood pressure goal below 130/90 and lipids with LDL cholesterol goal below 100 mg percent. Return for followup in 6 months with Larita Fife, NP  Instructed patient to Monitor heart rate and blood pressure over the weekend. If you feel dizzy and heart rate continues to be low, please seek medical attention. Patient has routine physical exam with PCP in 2 weeks.  Kiarrah Rausch NP-C 04/16/2013, 3:44 PM  [facility] [address] [place], [place] [address] [phone]

## 2013-04-29 DIAGNOSIS — R7309 Other abnormal glucose: Secondary | ICD-10-CM | POA: Diagnosis not present

## 2013-04-29 DIAGNOSIS — I4891 Unspecified atrial fibrillation: Secondary | ICD-10-CM | POA: Diagnosis not present

## 2013-05-05 DIAGNOSIS — I4891 Unspecified atrial fibrillation: Secondary | ICD-10-CM | POA: Diagnosis not present

## 2013-05-05 DIAGNOSIS — I10 Essential (primary) hypertension: Secondary | ICD-10-CM | POA: Diagnosis not present

## 2013-05-05 DIAGNOSIS — Z23 Encounter for immunization: Secondary | ICD-10-CM | POA: Diagnosis not present

## 2013-05-05 DIAGNOSIS — G44309 Post-traumatic headache, unspecified, not intractable: Secondary | ICD-10-CM | POA: Diagnosis not present

## 2013-05-05 DIAGNOSIS — R5381 Other malaise: Secondary | ICD-10-CM | POA: Diagnosis not present

## 2013-05-05 DIAGNOSIS — N189 Chronic kidney disease, unspecified: Secondary | ICD-10-CM | POA: Diagnosis not present

## 2013-05-05 DIAGNOSIS — E782 Mixed hyperlipidemia: Secondary | ICD-10-CM | POA: Diagnosis not present

## 2013-05-05 DIAGNOSIS — R071 Chest pain on breathing: Secondary | ICD-10-CM | POA: Diagnosis not present

## 2013-05-11 DIAGNOSIS — R262 Difficulty in walking, not elsewhere classified: Secondary | ICD-10-CM | POA: Diagnosis not present

## 2013-05-11 DIAGNOSIS — R269 Unspecified abnormalities of gait and mobility: Secondary | ICD-10-CM | POA: Diagnosis not present

## 2013-05-11 DIAGNOSIS — IMO0001 Reserved for inherently not codable concepts without codable children: Secondary | ICD-10-CM | POA: Diagnosis not present

## 2013-05-11 DIAGNOSIS — M6281 Muscle weakness (generalized): Secondary | ICD-10-CM | POA: Diagnosis not present

## 2013-05-13 DIAGNOSIS — R5381 Other malaise: Secondary | ICD-10-CM | POA: Diagnosis not present

## 2013-05-13 DIAGNOSIS — IMO0002 Reserved for concepts with insufficient information to code with codable children: Secondary | ICD-10-CM | POA: Diagnosis not present

## 2013-05-13 DIAGNOSIS — E782 Mixed hyperlipidemia: Secondary | ICD-10-CM | POA: Diagnosis not present

## 2013-05-13 DIAGNOSIS — I10 Essential (primary) hypertension: Secondary | ICD-10-CM | POA: Diagnosis not present

## 2013-05-13 DIAGNOSIS — I4891 Unspecified atrial fibrillation: Secondary | ICD-10-CM | POA: Diagnosis not present

## 2013-05-13 DIAGNOSIS — R7309 Other abnormal glucose: Secondary | ICD-10-CM | POA: Diagnosis not present

## 2013-05-13 DIAGNOSIS — K2100 Gastro-esophageal reflux disease with esophagitis, without bleeding: Secondary | ICD-10-CM | POA: Diagnosis not present

## 2013-05-14 DIAGNOSIS — IMO0001 Reserved for inherently not codable concepts without codable children: Secondary | ICD-10-CM | POA: Diagnosis not present

## 2013-05-14 DIAGNOSIS — R262 Difficulty in walking, not elsewhere classified: Secondary | ICD-10-CM | POA: Diagnosis not present

## 2013-05-14 DIAGNOSIS — R269 Unspecified abnormalities of gait and mobility: Secondary | ICD-10-CM | POA: Diagnosis not present

## 2013-05-14 DIAGNOSIS — M6281 Muscle weakness (generalized): Secondary | ICD-10-CM | POA: Diagnosis not present

## 2013-05-17 DIAGNOSIS — IMO0001 Reserved for inherently not codable concepts without codable children: Secondary | ICD-10-CM | POA: Diagnosis not present

## 2013-05-17 DIAGNOSIS — R262 Difficulty in walking, not elsewhere classified: Secondary | ICD-10-CM | POA: Diagnosis not present

## 2013-05-17 DIAGNOSIS — M6281 Muscle weakness (generalized): Secondary | ICD-10-CM | POA: Diagnosis not present

## 2013-05-17 DIAGNOSIS — R269 Unspecified abnormalities of gait and mobility: Secondary | ICD-10-CM | POA: Diagnosis not present

## 2013-05-19 ENCOUNTER — Encounter (INDEPENDENT_AMBULATORY_CARE_PROVIDER_SITE_OTHER): Payer: Self-pay | Admitting: *Deleted

## 2013-05-19 DIAGNOSIS — Z9181 History of falling: Secondary | ICD-10-CM | POA: Diagnosis not present

## 2013-05-19 DIAGNOSIS — R262 Difficulty in walking, not elsewhere classified: Secondary | ICD-10-CM | POA: Diagnosis not present

## 2013-05-19 DIAGNOSIS — M6281 Muscle weakness (generalized): Secondary | ICD-10-CM | POA: Diagnosis not present

## 2013-05-19 DIAGNOSIS — IMO0001 Reserved for inherently not codable concepts without codable children: Secondary | ICD-10-CM | POA: Diagnosis not present

## 2013-06-15 DIAGNOSIS — I4891 Unspecified atrial fibrillation: Secondary | ICD-10-CM | POA: Diagnosis not present

## 2013-06-21 ENCOUNTER — Ambulatory Visit (INDEPENDENT_AMBULATORY_CARE_PROVIDER_SITE_OTHER): Payer: Medicare Other | Admitting: Internal Medicine

## 2013-06-21 ENCOUNTER — Other Ambulatory Visit (INDEPENDENT_AMBULATORY_CARE_PROVIDER_SITE_OTHER): Payer: Self-pay | Admitting: *Deleted

## 2013-06-21 ENCOUNTER — Encounter (INDEPENDENT_AMBULATORY_CARE_PROVIDER_SITE_OTHER): Payer: Self-pay | Admitting: Internal Medicine

## 2013-06-21 ENCOUNTER — Encounter (INDEPENDENT_AMBULATORY_CARE_PROVIDER_SITE_OTHER): Payer: Self-pay | Admitting: *Deleted

## 2013-06-21 VITALS — BP 144/90 | HR 74 | Temp 98.5°F | Resp 18 | Ht 66.0 in | Wt 165.8 lb

## 2013-06-21 DIAGNOSIS — R1310 Dysphagia, unspecified: Secondary | ICD-10-CM | POA: Insufficient documentation

## 2013-06-21 DIAGNOSIS — R197 Diarrhea, unspecified: Secondary | ICD-10-CM

## 2013-06-21 MED ORDER — LOPERAMIDE HCL 2 MG PO TABS: 1.0000 mg | ORAL_TABLET | Freq: Every day | ORAL | Status: DC

## 2013-06-21 NOTE — Patient Instructions (Signed)
Stool diary as to frequency and consistency of stools until time of procedure. Esophagogastroduodenoscopy and esophageal dilation to be scheduled

## 2013-06-21 NOTE — Progress Notes (Signed)
Presenting complaint;  Dysphagia and diarrhea.  History of present illness;  Patient is 79 year old Caucasian female who is referred through courtesy of Dr. Almond Lint for GI evaluation. She presents with which to complaints. She states she has had diarrhea off and on all of her life. She has never been constipated. She has intermittent urgency but never had accidents. She has anywhere from 2-3 bowel movements per day. On her best days she has one formed stool. He denies melena rectal bleeding anorexia or weight loss. She does experience abdominal cramps following loose stool. She did try: Health for few months but without symptomatic benefit. She denies nocturnal bowel movements. She has tried Imodium in the past but she becomes constipated. She believes she gets enough fiber in her diet. She states she has had 3 colonoscopies primarily for high risk screening her last colonoscopy was in March 2010 revealing diverticula and sigmoid colon. She is not interested in having any more colonoscopies unless absolutely necessary. She started to have difficulty swallowing about 18 months ago. This occurs primarily with solids and dry foods. Initially this symptom occurred sporadically but lately has been more often she points to upper sternal area at site of bolus obstruction. On most occasions food bolus passes distally but every now and then she has to regurgitate to get relief. She denies heartburn. She has good appetite and her weight has been stable.  Current Medications: Current Outpatient Prescriptions  Medication Sig Dispense Refill  . atenolol (TENORMIN) 50 MG tablet Take 50 mg by mouth daily.        Marland Kitchen atorvastatin (LIPITOR) 20 MG tablet Take 20 mg by mouth daily.      . Biotin 1000 MCG tablet Take 1,000 mcg by mouth daily.      . Calcium Carbonate-Vitamin D (CALTRATE 600+D) 600-400 MG-UNIT per tablet Take 1 tablet by mouth daily.        . citalopram (CELEXA) 20 MG tablet Take 20 mg by mouth  daily.      Marland Kitchen diltiazem (CARDIZEM CD) 240 MG 24 hr capsule Take 240 mg by mouth daily.      Marland Kitchen losartan (COZAAR) 100 MG tablet Take 100 mg by mouth daily.      . predniSONE (DELTASONE) 1 MG tablet Take 4 mg by mouth daily.      Marland Kitchen warfarin (COUMADIN) 4 MG tablet Take 4 mg by mouth daily.       No current facility-administered medications for this visit.   Past medical history; Hypertension of over 20 years duration. Atrial fibrillation was diagnosed about 10 years ago she has been anticoagulated. Hyperlipidemia. 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 History of polymyalgia rheumatica. History of depression. Tonsillectomy at age 15. Appendectomy in 1951. BSO with hysterectomy for excessive bleeding in 1985. She has had bilateral cataract extraction. Surgery for left ankle fracture in 2012. 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  Allergies; Allergies  Allergen Reactions  . Sulfonamide Derivatives     Family history; Father lived to be 26. Mother had surgery for colon carcinoma at age 52 and died of unrelated causes at 32. She had 8 siblings. Only two sisters and one brother are living. One brother was diagnosed with metastatic colon carcinoma at age 53 and died within 3 months.  Social history; She is married and has 4 children. Her daughter has rheumatoid arthritis and  her 3 sons are in good health. She worked for over 50 years. 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     Objective: Blood pressure 144/90, pulse 74, temperature 98.5 F (36.9 C), temperature source Oral, resp. rate 18, height 5\' 6"  (1.676 m), weight 165 lb 12.8 oz (75.206 kg). Patient is alert and in no acute distress. Conjunctiva is pink. Sclera is nonicteric Oropharyngeal mucosa is normal. She has  partial upper plate. No neck masses or thyromegaly noted. Cardiac exam with irregular rhythm normal S1 and S2. No murmur or gallop noted. Lungs are clear to auscultation. Abdomen is symmetrical. Bowel sounds are normal. On palpation soft abdomen without tenderness organomegaly or masses. Rectal examination reveals normal sphincter tone. She has soft stool in rectal vault which is guaiac-negative.  No LE edema or clubbing noted.    Assessment:  #1. Dysphagia. Dysphagia is primarily to solids and she does not have a lot of symptoms. She possibly has is a fascial motility disorder. However if she has esophageal web or ring it would be unable to endoscopic therapy. #2. Chronic diarrhea. Her diarrhea appears to be typical of irritable bowel syndrome. Once again she does not have a lump symptoms. Her stool is guaiac negative. Family history significant for colon carcinoma in a brother age 91 and mother at age 81. She is up-to-date on screening for CRC. Next exam would be in March 2015 which she is very reluctant to have. However if diarrhea does not respond to simple measures will offer flexible sigmoidoscopy to rule out microscopic or collagenous colitis.  Recommendations;  Continue high fiber diet. Imodium OTC 1 mg by mouth every morning. Will schedule patient for EGD and possible esophageal dilation. She will need to be off warfarin for 4-5 days. Will check with Dr. Donzetta Sprung. Patient will keep a stool diary until the time of EGD. May consider flexible sigmoidoscopy if diarrhea persists.  We appreciate the opportunity to participate in the care of this nice lady.

## 2013-06-23 DIAGNOSIS — M79609 Pain in unspecified limb: Secondary | ICD-10-CM | POA: Diagnosis not present

## 2013-06-23 DIAGNOSIS — M6789 Other specified disorders of synovium and tendon, multiple sites: Secondary | ICD-10-CM | POA: Diagnosis not present

## 2013-06-24 ENCOUNTER — Encounter (HOSPITAL_COMMUNITY): Payer: Self-pay | Admitting: Pharmacy Technician

## 2013-07-07 ENCOUNTER — Ambulatory Visit: Admit: 2013-07-07 | Payer: Self-pay | Admitting: Internal Medicine

## 2013-07-07 SURGERY — ESOPHAGOGASTRODUODENOSCOPY (EGD) WITH ESOPHAGEAL DILATION
Anesthesia: Moderate Sedation

## 2013-07-13 DIAGNOSIS — M79609 Pain in unspecified limb: Secondary | ICD-10-CM | POA: Diagnosis not present

## 2013-07-13 DIAGNOSIS — M6789 Other specified disorders of synovium and tendon, multiple sites: Secondary | ICD-10-CM | POA: Diagnosis not present

## 2013-07-20 DIAGNOSIS — I4891 Unspecified atrial fibrillation: Secondary | ICD-10-CM | POA: Diagnosis not present

## 2013-07-30 IMAGING — CR DG CHEST 2V
1 series · 1 of 1 positions shown · non-contrast
Comparison: CT abdomen 12/21/2008.

CLINICAL DATA: 81-year-old female Code stroke.  Hypertension atrial
fibrillation.

CHEST - 2 VIEW

[w chest lat]
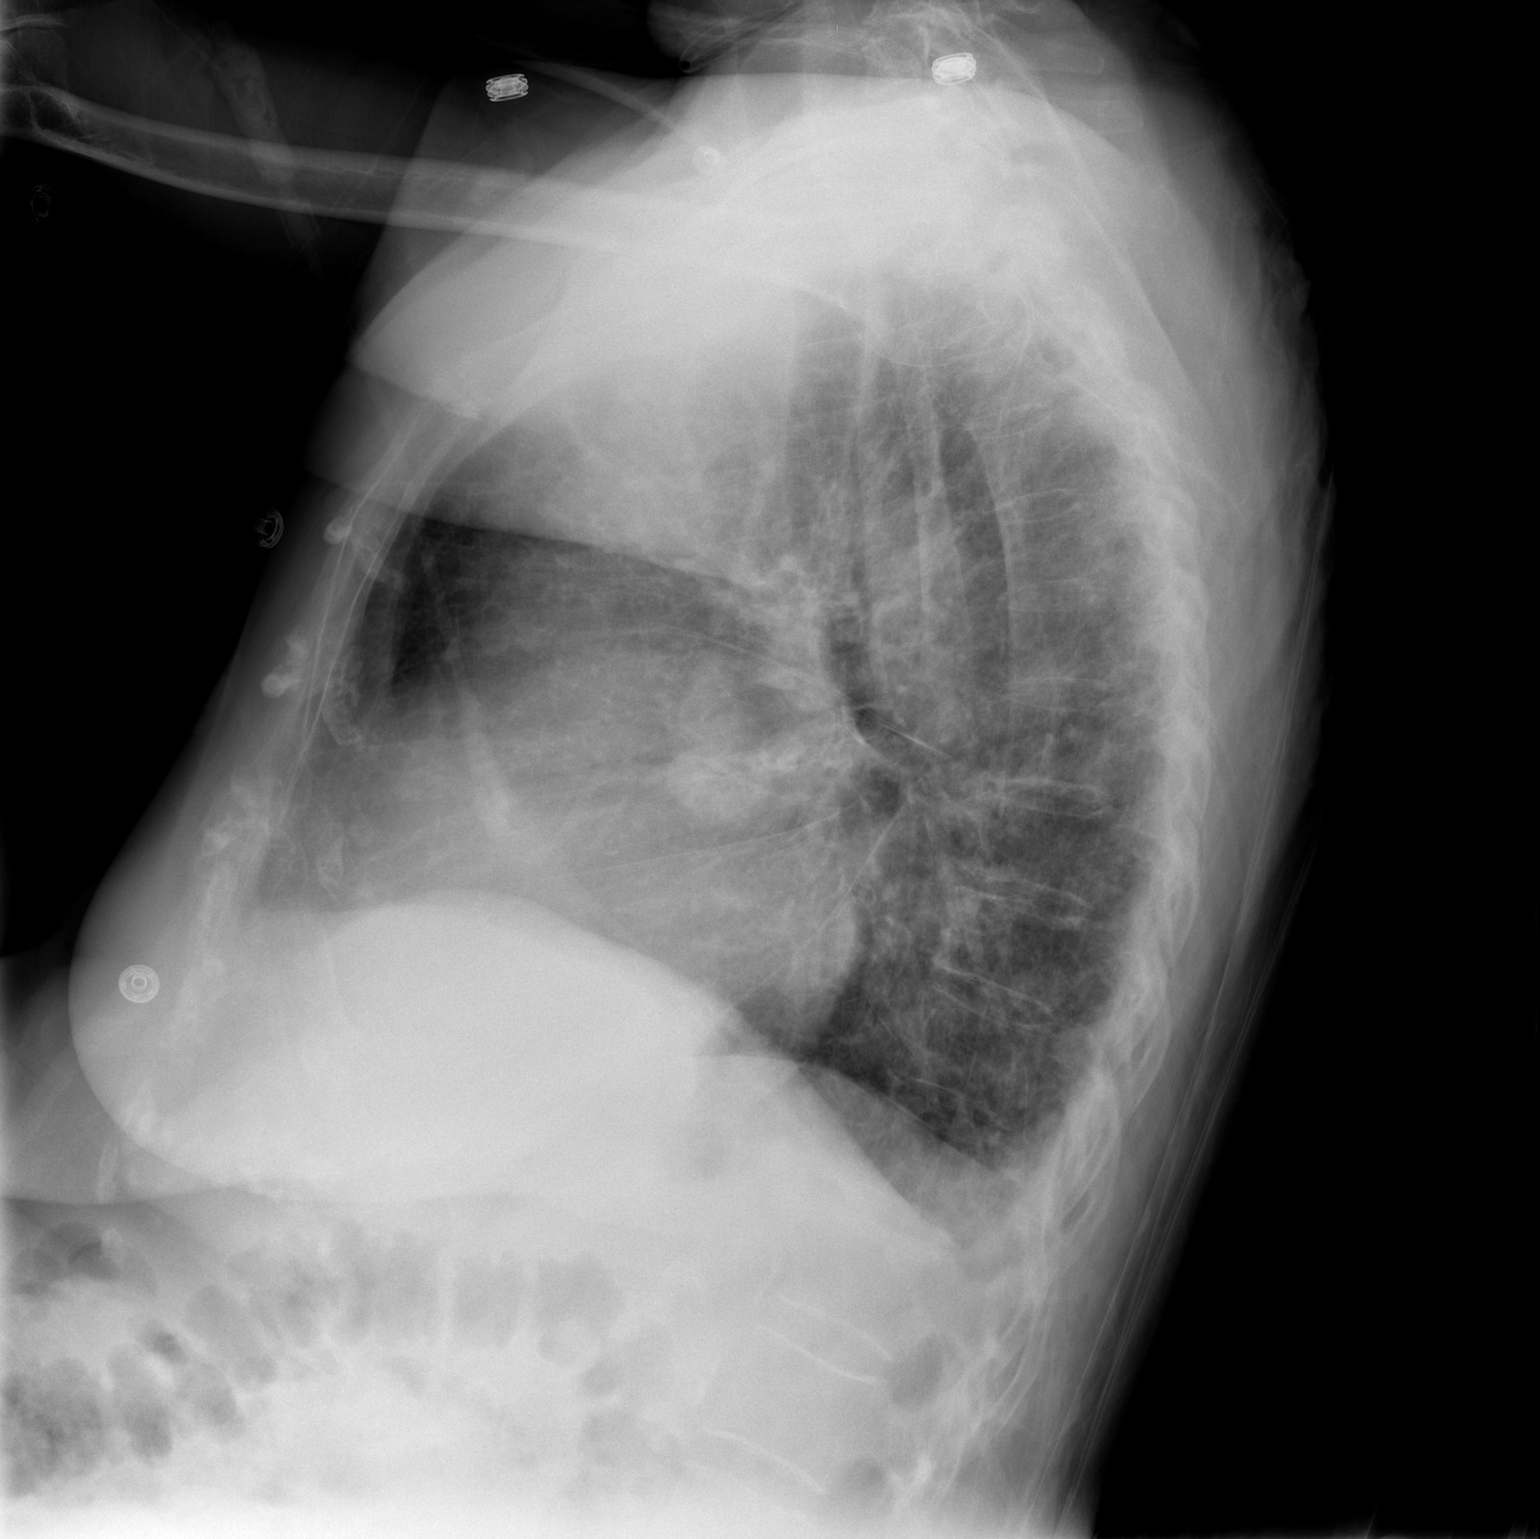

[1 of 1 positions shown; findings below may reference images not displayed]

FINDINGS: Upright and lateral views of the chest.  Mild
cardiomegaly. Other mediastinal contours are within normal limits.
No pneumothorax, pulmonary edema or consolidation.  Small left
pleural effusion cannot be excluded.  No other confluent pulmonary
opacity. No acute osseous abnormality identified.
IMPRESSION: Cardiomegaly.  Small left pleural effusion suspected.

## 2013-08-03 ENCOUNTER — Encounter (INDEPENDENT_AMBULATORY_CARE_PROVIDER_SITE_OTHER): Payer: Self-pay

## 2013-08-04 DIAGNOSIS — M6789 Other specified disorders of synovium and tendon, multiple sites: Secondary | ICD-10-CM | POA: Diagnosis not present

## 2013-08-04 DIAGNOSIS — M2140 Flat foot [pes planus] (acquired), unspecified foot: Secondary | ICD-10-CM | POA: Diagnosis not present

## 2013-08-17 DIAGNOSIS — I4891 Unspecified atrial fibrillation: Secondary | ICD-10-CM | POA: Diagnosis not present

## 2013-08-21 DIAGNOSIS — I10 Essential (primary) hypertension: Secondary | ICD-10-CM | POA: Diagnosis not present

## 2013-08-21 DIAGNOSIS — Z7982 Long term (current) use of aspirin: Secondary | ICD-10-CM | POA: Diagnosis not present

## 2013-08-21 DIAGNOSIS — Z8673 Personal history of transient ischemic attack (TIA), and cerebral infarction without residual deficits: Secondary | ICD-10-CM | POA: Diagnosis not present

## 2013-08-21 DIAGNOSIS — I4891 Unspecified atrial fibrillation: Secondary | ICD-10-CM | POA: Diagnosis not present

## 2013-08-21 DIAGNOSIS — R040 Epistaxis: Secondary | ICD-10-CM | POA: Diagnosis not present

## 2013-08-21 DIAGNOSIS — Z79899 Other long term (current) drug therapy: Secondary | ICD-10-CM | POA: Diagnosis not present

## 2013-08-21 DIAGNOSIS — Z7901 Long term (current) use of anticoagulants: Secondary | ICD-10-CM | POA: Diagnosis not present

## 2013-08-21 DIAGNOSIS — Z8249 Family history of ischemic heart disease and other diseases of the circulatory system: Secondary | ICD-10-CM | POA: Diagnosis not present

## 2013-08-25 DIAGNOSIS — R040 Epistaxis: Secondary | ICD-10-CM | POA: Diagnosis not present

## 2013-08-25 DIAGNOSIS — R05 Cough: Secondary | ICD-10-CM | POA: Diagnosis not present

## 2013-08-25 DIAGNOSIS — R059 Cough, unspecified: Secondary | ICD-10-CM | POA: Diagnosis not present

## 2013-08-30 DIAGNOSIS — I4891 Unspecified atrial fibrillation: Secondary | ICD-10-CM | POA: Diagnosis not present

## 2013-08-31 DIAGNOSIS — N189 Chronic kidney disease, unspecified: Secondary | ICD-10-CM | POA: Diagnosis not present

## 2013-08-31 DIAGNOSIS — I6789 Other cerebrovascular disease: Secondary | ICD-10-CM | POA: Diagnosis not present

## 2013-08-31 DIAGNOSIS — K2100 Gastro-esophageal reflux disease with esophagitis, without bleeding: Secondary | ICD-10-CM | POA: Diagnosis not present

## 2013-08-31 DIAGNOSIS — I4891 Unspecified atrial fibrillation: Secondary | ICD-10-CM | POA: Diagnosis not present

## 2013-08-31 DIAGNOSIS — M1990 Unspecified osteoarthritis, unspecified site: Secondary | ICD-10-CM | POA: Diagnosis not present

## 2013-08-31 DIAGNOSIS — I10 Essential (primary) hypertension: Secondary | ICD-10-CM | POA: Diagnosis not present

## 2013-08-31 DIAGNOSIS — E782 Mixed hyperlipidemia: Secondary | ICD-10-CM | POA: Diagnosis not present

## 2013-08-31 DIAGNOSIS — I509 Heart failure, unspecified: Secondary | ICD-10-CM | POA: Diagnosis not present

## 2013-09-07 DIAGNOSIS — R5381 Other malaise: Secondary | ICD-10-CM | POA: Diagnosis not present

## 2013-09-07 DIAGNOSIS — R197 Diarrhea, unspecified: Secondary | ICD-10-CM | POA: Diagnosis not present

## 2013-09-07 DIAGNOSIS — E782 Mixed hyperlipidemia: Secondary | ICD-10-CM | POA: Diagnosis not present

## 2013-09-07 DIAGNOSIS — R071 Chest pain on breathing: Secondary | ICD-10-CM | POA: Diagnosis not present

## 2013-09-07 DIAGNOSIS — I10 Essential (primary) hypertension: Secondary | ICD-10-CM | POA: Diagnosis not present

## 2013-09-07 DIAGNOSIS — I4891 Unspecified atrial fibrillation: Secondary | ICD-10-CM | POA: Diagnosis not present

## 2013-09-07 DIAGNOSIS — R5383 Other fatigue: Secondary | ICD-10-CM | POA: Diagnosis not present

## 2013-09-07 DIAGNOSIS — G44309 Post-traumatic headache, unspecified, not intractable: Secondary | ICD-10-CM | POA: Diagnosis not present

## 2013-09-07 DIAGNOSIS — R269 Unspecified abnormalities of gait and mobility: Secondary | ICD-10-CM | POA: Diagnosis not present

## 2013-09-09 DIAGNOSIS — I4891 Unspecified atrial fibrillation: Secondary | ICD-10-CM | POA: Diagnosis not present

## 2013-09-13 DIAGNOSIS — I4891 Unspecified atrial fibrillation: Secondary | ICD-10-CM | POA: Diagnosis not present

## 2013-09-21 DIAGNOSIS — I4891 Unspecified atrial fibrillation: Secondary | ICD-10-CM | POA: Diagnosis not present

## 2013-09-21 DIAGNOSIS — R269 Unspecified abnormalities of gait and mobility: Secondary | ICD-10-CM | POA: Diagnosis not present

## 2013-09-21 DIAGNOSIS — R5383 Other fatigue: Secondary | ICD-10-CM | POA: Diagnosis not present

## 2013-09-21 DIAGNOSIS — R071 Chest pain on breathing: Secondary | ICD-10-CM | POA: Diagnosis not present

## 2013-09-21 DIAGNOSIS — Z1331 Encounter for screening for depression: Secondary | ICD-10-CM | POA: Diagnosis not present

## 2013-09-21 DIAGNOSIS — I10 Essential (primary) hypertension: Secondary | ICD-10-CM | POA: Diagnosis not present

## 2013-09-21 DIAGNOSIS — R5381 Other malaise: Secondary | ICD-10-CM | POA: Diagnosis not present

## 2013-09-21 DIAGNOSIS — E782 Mixed hyperlipidemia: Secondary | ICD-10-CM | POA: Diagnosis not present

## 2013-09-21 DIAGNOSIS — G44309 Post-traumatic headache, unspecified, not intractable: Secondary | ICD-10-CM | POA: Diagnosis not present

## 2013-09-28 DIAGNOSIS — I4891 Unspecified atrial fibrillation: Secondary | ICD-10-CM | POA: Diagnosis not present

## 2013-10-07 DIAGNOSIS — H6120 Impacted cerumen, unspecified ear: Secondary | ICD-10-CM | POA: Diagnosis not present

## 2013-10-07 DIAGNOSIS — R279 Unspecified lack of coordination: Secondary | ICD-10-CM | POA: Diagnosis not present

## 2013-10-07 DIAGNOSIS — J309 Allergic rhinitis, unspecified: Secondary | ICD-10-CM | POA: Diagnosis not present

## 2013-10-11 DIAGNOSIS — G9389 Other specified disorders of brain: Secondary | ICD-10-CM | POA: Diagnosis not present

## 2013-10-11 DIAGNOSIS — R279 Unspecified lack of coordination: Secondary | ICD-10-CM | POA: Diagnosis not present

## 2013-10-18 ENCOUNTER — Ambulatory Visit: Payer: Medicare Other | Admitting: Nurse Practitioner

## 2013-10-20 DIAGNOSIS — I4891 Unspecified atrial fibrillation: Secondary | ICD-10-CM | POA: Diagnosis not present

## 2013-10-22 ENCOUNTER — Encounter: Payer: Self-pay | Admitting: Nurse Practitioner

## 2013-10-22 ENCOUNTER — Ambulatory Visit (INDEPENDENT_AMBULATORY_CARE_PROVIDER_SITE_OTHER): Payer: Medicare Other | Admitting: Nurse Practitioner

## 2013-10-22 ENCOUNTER — Encounter (INDEPENDENT_AMBULATORY_CARE_PROVIDER_SITE_OTHER): Payer: Self-pay

## 2013-10-22 VITALS — BP 176/82 | HR 73 | Temp 97.6°F | Ht 67.0 in | Wt 172.0 lb

## 2013-10-22 DIAGNOSIS — I6350 Cerebral infarction due to unspecified occlusion or stenosis of unspecified cerebral artery: Secondary | ICD-10-CM | POA: Diagnosis not present

## 2013-10-22 NOTE — Patient Instructions (Signed)
PLAN:  Continue warfarin for second stroke prevention along with strict control of hypertension with blood pressure goal below 130/90 and lipids with LDL cholesterol goal below 100 mg percent. Return for followup in 6 months.

## 2013-10-22 NOTE — Progress Notes (Signed)
PATIENT: [name] [name] DOB: 10-Apr-1930  REASON FOR VISIT: follow up for stroke HISTORY FROM: patient  HISTORY OF PRESENT ILLNESS: 80 year old lady with left MCA branch infarct in May 2013 from atrial fibrillation  She returns for followup after last visit on 07/01/12. She continues to do well and has not had any recurrence stroke or TIA symptoms. She remains on warfarin which is tolerating well with only minor bruising and no significant bleeding episodes. She states her blood pressure is under good control usually though it is elevated at 178/72 in office today. She is not aware when her last lipid profile was checked but apparently it was okay. She complains of posterior neck and occipital headaches which are intermittent and occur on a daily basis for the last 2-3 weeks. They last usually a few hours and Tylenol provides good relief. The pain is usually pressure-like though it has a sharp component to it also he she also admits to type feeling in the neck muscles. She denies any recent fall head injury.   UPDATE 04/16/13 (LL): Since last visit on 01/06/13, headaches are better, occasional left parietal headache. States had a fall in June, electricity was out, knocked unconscious, walked into wall. Went to [facility]; CT showed bleeding subdural; had confusion, did not feel over it until first of August. They were camping 2 weeks ago, neighbor dog was loose, attacked her dog and she and her husband fell trying to protect their dog. She had bump on head and small wound, but it stopped bleeding, went to [facility] to get checked, CT was negative. Has been feeling a little dizzy today. Comadin checked this morning, PT was 2.6. HR in our office today is 46 at check-in and 48 during exam. She states this is not her usual. Denies CP, nausea, any pain. She states she does check her blood pressure at home and heart rate is usually in the 60s-70. Does not have cardiologist since Dr. Dannielle Burn left [place]. Dr.  Quillian Quince, her PCP handles all of her care. No neurovascular symptoms.   UPDATE 10/22/13 (LL): [name] [name] returns for 6 month stroke followup, she states that she has been doing well.  She has an occasional headache that is relieved with Tylenol, but no other complaints.  She is tolerating Coumadin well without significant bruising or bleeding, last PT was 2.3.  BP in office today is 176/82, states she does not check it at home.  REVIEW OF SYSTEMS: Full 14 system review of systems performed and notable only  Headache, Hearing loss  ALLERGIES: Allergies  Allergen Reactions  . Sulfonamide Derivatives Rash    HOME MEDICATIONS: Outpatient Prescriptions Prior to Visit  Medication Sig Dispense Refill  . atenolol (TENORMIN) 50 MG tablet Take 50 mg by mouth daily.        Marland Kitchen atorvastatin (LIPITOR) 20 MG tablet Take 20 mg by mouth daily.      . Biotin 1000 MCG tablet Take 1,000 mcg by mouth daily.      . Calcium Carbonate-Vitamin D (CALTRATE 600+D) 600-400 MG-UNIT per tablet Take 1 tablet by mouth daily.        . citalopram (CELEXA) 20 MG tablet Take 20 mg by mouth daily.      Marland Kitchen diltiazem (CARDIZEM CD) 240 MG 24 hr capsule Take 240 mg by mouth daily.      Marland Kitchen losartan (COZAAR) 100 MG tablet Take 100 mg by mouth daily.      . predniSONE (DELTASONE) 1 MG tablet Take  4 mg by mouth daily.      Marland Kitchen warfarin (COUMADIN) 4 MG tablet Take 2-4 mg by mouth daily. Takes 2 mg for one day then 4 mg for 3 days. Repeat.      . loperamide (IMODIUM A-D) 2 MG tablet Take 0.5 tablets (1 mg total) by mouth daily.  30 tablet  0   No facility-administered medications prior to visit.     PHYSICAL EXAM  Filed Vitals:   10/22/13 1610  BP: 176/82  Pulse: 73  Temp: 97.6 F (36.4 C)  TempSrc: Oral  Height: 5\' 7"  (1.702 m)  Weight: 172 lb (78.019 kg)   Body mass index is 26.93 kg/(m^2).  Physical Exam  General: well developed, well nourished, seated, in no evident distress  Head: head normocephalic and atraumatic.  Orohparynx benign  Neck: supple with soft carotid bruit on right  Cardiovascular:bradycardic rate and irregular rhythm, no murmurs  Musculoskeletal: no deformity  Skin: no rash/petichiae  Vascular: Normal pulses all extremities   Neurologic Exam  Mental Status: Awake and fully alert. Oriented to place and time. Recent and remote memory intact. Attention span, concentration and fund of knowledge appropriate. Mood and affect appropriate.  Cranial Nerves: Pupils equal, briskly reactive to light. Extraocular movements full without nystagmus. Visual fields full to confrontation. Hearing intact. Facial sensation intact. Face, tongue, palate moves normally and symmetrically.  Motor: Normal bulk and tone. Normal strength in all tested extremities, MILD WEAKER RIGHT HAND GRIP.  Sensory: intact to touch and pinprick.  Coordination: Rapid alternating movements normal in all extremities. Finger-to-nose and heel-to-shin performed accurately bilaterally.  Gait and Station: Arises from chair without difficulty. Stance is normal. Gait demonstrates normal stride length and balance . Not able to heel, toe and tandem walk without difficulty. Romberg negative.  Reflexes: 1+ and symmetric.    ASSESSMENT AND PLAN 35- year-old lady with left MCA branch infarct in May 2013 from atrial fibrillation continues to do well on anticoagulation with warfarin.  PLAN:  Repeat Carotid Doppler study, last in 2013. Continue warfarin for second stroke prevention along with strict control of hypertension with blood pressure goal below 130/90 and lipids with LDL cholesterol goal below 100 mg percent. Return for followup in 6 months.  Philmore Pali, MSN, NP-C 10/22/2013, 4:39 PM [facility] [address], [place], [place] [address] [phone]  Note: This document was prepared with digital dictation and possible smart phrase technology. Any transcriptional errors that result from this process are  unintentional.

## 2013-10-24 ENCOUNTER — Encounter: Payer: Self-pay | Admitting: Nurse Practitioner

## 2013-11-03 ENCOUNTER — Ambulatory Visit (INDEPENDENT_AMBULATORY_CARE_PROVIDER_SITE_OTHER): Payer: Medicare Other

## 2013-11-03 DIAGNOSIS — I6350 Cerebral infarction due to unspecified occlusion or stenosis of unspecified cerebral artery: Secondary | ICD-10-CM | POA: Diagnosis not present

## 2013-11-09 DIAGNOSIS — I4891 Unspecified atrial fibrillation: Secondary | ICD-10-CM | POA: Diagnosis not present

## 2013-11-11 ENCOUNTER — Telehealth: Payer: Self-pay | Admitting: Nurse Practitioner

## 2013-11-11 NOTE — Telephone Encounter (Signed)
Called [name] [name] to let her know her carotid doppler study was negative for significant stenosis.  She acknowledged the results and had no further questions.

## 2013-11-15 DIAGNOSIS — Z961 Presence of intraocular lens: Secondary | ICD-10-CM | POA: Diagnosis not present

## 2013-11-15 DIAGNOSIS — H524 Presbyopia: Secondary | ICD-10-CM | POA: Diagnosis not present

## 2013-11-15 DIAGNOSIS — H02839 Dermatochalasis of unspecified eye, unspecified eyelid: Secondary | ICD-10-CM | POA: Diagnosis not present

## 2013-11-15 DIAGNOSIS — H4310 Vitreous hemorrhage, unspecified eye: Secondary | ICD-10-CM | POA: Diagnosis not present

## 2013-12-10 DIAGNOSIS — I4891 Unspecified atrial fibrillation: Secondary | ICD-10-CM | POA: Diagnosis not present

## 2013-12-15 DIAGNOSIS — Z1231 Encounter for screening mammogram for malignant neoplasm of breast: Secondary | ICD-10-CM | POA: Diagnosis not present

## 2013-12-17 DIAGNOSIS — I4891 Unspecified atrial fibrillation: Secondary | ICD-10-CM | POA: Diagnosis not present

## 2013-12-27 DIAGNOSIS — I4891 Unspecified atrial fibrillation: Secondary | ICD-10-CM | POA: Diagnosis not present

## 2013-12-27 DIAGNOSIS — E782 Mixed hyperlipidemia: Secondary | ICD-10-CM | POA: Diagnosis not present

## 2013-12-27 DIAGNOSIS — N183 Chronic kidney disease, stage 3 unspecified: Secondary | ICD-10-CM | POA: Diagnosis not present

## 2013-12-27 DIAGNOSIS — K2100 Gastro-esophageal reflux disease with esophagitis, without bleeding: Secondary | ICD-10-CM | POA: Diagnosis not present

## 2013-12-27 DIAGNOSIS — I6789 Other cerebrovascular disease: Secondary | ICD-10-CM | POA: Diagnosis not present

## 2013-12-27 DIAGNOSIS — I509 Heart failure, unspecified: Secondary | ICD-10-CM | POA: Diagnosis not present

## 2013-12-27 DIAGNOSIS — I10 Essential (primary) hypertension: Secondary | ICD-10-CM | POA: Diagnosis not present

## 2013-12-27 DIAGNOSIS — M1990 Unspecified osteoarthritis, unspecified site: Secondary | ICD-10-CM | POA: Diagnosis not present

## 2014-01-03 DIAGNOSIS — R071 Chest pain on breathing: Secondary | ICD-10-CM | POA: Diagnosis not present

## 2014-01-03 DIAGNOSIS — R0609 Other forms of dyspnea: Secondary | ICD-10-CM | POA: Diagnosis not present

## 2014-01-03 DIAGNOSIS — R5381 Other malaise: Secondary | ICD-10-CM | POA: Diagnosis not present

## 2014-01-03 DIAGNOSIS — E782 Mixed hyperlipidemia: Secondary | ICD-10-CM | POA: Diagnosis not present

## 2014-01-03 DIAGNOSIS — I4891 Unspecified atrial fibrillation: Secondary | ICD-10-CM | POA: Diagnosis not present

## 2014-01-03 DIAGNOSIS — R0989 Other specified symptoms and signs involving the circulatory and respiratory systems: Secondary | ICD-10-CM | POA: Diagnosis not present

## 2014-01-03 DIAGNOSIS — I10 Essential (primary) hypertension: Secondary | ICD-10-CM | POA: Diagnosis not present

## 2014-01-03 DIAGNOSIS — G44309 Post-traumatic headache, unspecified, not intractable: Secondary | ICD-10-CM | POA: Diagnosis not present

## 2014-01-03 DIAGNOSIS — R072 Precordial pain: Secondary | ICD-10-CM | POA: Diagnosis not present

## 2014-01-13 ENCOUNTER — Encounter: Payer: Self-pay | Admitting: Cardiology

## 2014-01-13 DIAGNOSIS — R079 Chest pain, unspecified: Secondary | ICD-10-CM | POA: Diagnosis not present

## 2014-01-17 DIAGNOSIS — I4891 Unspecified atrial fibrillation: Secondary | ICD-10-CM | POA: Diagnosis not present

## 2014-01-24 ENCOUNTER — Encounter: Payer: Self-pay | Admitting: Neurology

## 2014-02-14 DIAGNOSIS — I6789 Other cerebrovascular disease: Secondary | ICD-10-CM | POA: Diagnosis not present

## 2014-03-16 DIAGNOSIS — I4891 Unspecified atrial fibrillation: Secondary | ICD-10-CM | POA: Diagnosis not present

## 2014-03-23 ENCOUNTER — Ambulatory Visit (INDEPENDENT_AMBULATORY_CARE_PROVIDER_SITE_OTHER): Payer: Medicare Other | Admitting: Neurology

## 2014-03-23 ENCOUNTER — Encounter: Payer: Self-pay | Admitting: Neurology

## 2014-03-23 VITALS — BP 167/71 | HR 63 | Ht 67.0 in | Wt 176.2 lb

## 2014-03-23 DIAGNOSIS — I6350 Cerebral infarction due to unspecified occlusion or stenosis of unspecified cerebral artery: Secondary | ICD-10-CM

## 2014-03-23 DIAGNOSIS — I6789 Other cerebrovascular disease: Secondary | ICD-10-CM

## 2014-03-23 NOTE — Patient Instructions (Addendum)
I had a long discussion with patient regarding her remote stroke, stroke risk factors, strategies for seconds she prevention and answered questions. I personally reviewed her carotid Doppler studies and discuss the need for yearly followup with her.Continue warfarin  for secondary stroke prevention and maintain strict control of hypertension with blood pressure goal below 130/90, diabetes with hemoglobin A1c goal below 6.5% and lipids with LDL cholesterol goal below 100 mg/dL. Followup in the future with Charlott Holler, NP in 6 months.

## 2014-03-23 NOTE — Progress Notes (Signed)
PATIENT: [name] [name] DOB: February 15, 1930  REASON FOR VISIT: follow up for stroke HISTORY FROM: patient  HISTORY OF PRESENT ILLNESS: 80 year old lady with left MCA branch infarct in May 2013 from atrial fibrillation  She returns for followup after last visit on 07/01/12. She continues to do well and has not had any recurrence stroke or TIA symptoms. She remains on warfarin which is tolerating well with only minor bruising and no significant bleeding episodes. She states her blood pressure is under good control usually though it is elevated at 178/72 in office today. She is not aware when her last lipid profile was checked but apparently it was okay. She complains of posterior neck and occipital headaches which are intermittent and occur on a daily basis for the last 2-3 weeks. They last usually a few hours and Tylenol provides good relief. The pain is usually pressure-like though it has a sharp component to it also he she also admits to type feeling in the neck muscles. She denies any recent fall head injury.   UPDATE 04/16/13 (LL): Since last visit on 01/06/13, headaches are better, occasional left parietal headache. States had a fall in June, electricity was out, knocked unconscious, walked into wall. Went to [facility]; CT showed bleeding subdural; had confusion, did not feel over it until first of August. They were camping 2 weeks ago, neighbor dog was loose, attacked her dog and she and her husband fell trying to protect their dog. She had bump on head and small wound, but it stopped bleeding, went to [facility] to get checked, CT was negative. Has been feeling a little dizzy today. Comadin checked this morning, PT was 2.6. HR in our office today is 46 at check-in and 48 during exam. She states this is not her usual. Denies CP, nausea, any pain. She states she does check her blood pressure at home and heart rate is usually in the 60s-70. Does not have cardiologist since [name] [name] left [facility]. Dr.  Quillian [name], her PCP handles all of her care. No neurovascular symptoms.   UPDATE 10/22/13 (LL): [name] [name] returns for 6 month stroke followup, she states that she has been doing well.  She has an occasional headache that is relieved with Tylenol, but no other complaints.  She is tolerating Coumadin well without significant bruising or bleeding, last PT was 2.3.  BP in office today is 176/82, states she does not check it at home. Update 03/23/2014 :(PS) : She returns for followup after last visit 5 months ago. She continues to do well from neurovascular standpoint without recurrent stroke or TIA symptoms. She had a followup carotid ultrasound done on 11/03/13 which showed no significant extracranial stenosis. She also had a echocardiogram done by her primary physician which I do not have the results for button apparently showed a small aortic aneurysm which is being followed conservatively. The patient has - ring but has had no near falls. She remains on Coumadin and INR has been quite steady and she has not had any bleeding or bruising issues. Her blood pressure is elevated today at 167/7 one in office and she does not check it regularly. She has intermittent left tremor when she gets anxious but this is not bothersome. REVIEW OF SYSTEMS: Full 14 system review of systems performed and notable only    Hearing loss, shortness of breath, diarrhea, weakness, walking difficulty and all other systems negative  ALLERGIES: Allergies  Allergen Reactions  . Sulfonamide Derivatives Rash    HOME  MEDICATIONS: Outpatient Prescriptions Prior to Visit  Medication Sig Dispense Refill  . atenolol (TENORMIN) 50 MG tablet Take 50 mg by mouth daily.        Marland Kitchen atorvastatin (LIPITOR) 20 MG tablet Take 20 mg by mouth daily.      . Biotin 1000 MCG tablet Take 1,000 mcg by mouth daily.      . Calcium Carbonate-Vitamin D (CALTRATE 600+D) 600-400 MG-UNIT per tablet Take 1 tablet by mouth daily.        . citalopram (CELEXA) 20 MG  tablet Take 20 mg by mouth daily.      Marland Kitchen diltiazem (CARDIZEM CD) 240 MG 24 hr capsule Take 240 mg by mouth daily.      Marland Kitchen losartan (COZAAR) 100 MG tablet Take 100 mg by mouth daily.      Marland Kitchen warfarin (COUMADIN) 4 MG tablet Take 2-4 mg by mouth daily. Takes 2 mg for one day then 4 mg for 3 days. Repeat.      . predniSONE (DELTASONE) 1 MG tablet Take 4 mg by mouth daily.       No facility-administered medications prior to visit.     PHYSICAL EXAM  Filed Vitals:   03/23/14 0934  BP: 167/71  Pulse: 63  Height: 5\' 7"  (1.702 m)  Weight: 79.924 kg (176 lb 3.2 oz)   Body mass index is 27.59 kg/(m^2).  Physical Exam  General: Frail elderly lady, seated, in no evident distress  Head: head normocephalic and atraumatic. Orohparynx benign  Neck: supple with soft carotid bruit on right  Cardiovascular:bradycardic rate and irregular rhythm, no murmurs  Musculoskeletal: no deformity  Skin: no rash/petichiae  Vascular: Normal pulses all extremities   Neurologic Exam  Mental Status: Awake and fully alert. Oriented to place and time. Recent and remote memory intact. Attention span, concentration and fund of knowledge appropriate. Mood and affect appropriate.  Cranial Nerves: Pupils equal, briskly reactive to light. Extraocular movements full without nystagmus. Visual fields full to confrontation. Hearing intact. Facial sensation intact. Face, tongue, palate moves normally and symmetrically.  Motor: Normal bulk and tone. Normal strength in all tested extremities, MILD WEAKER RIGHT HAND GRIP.  Sensory: intact to touch and pinprick.  Coordination: Rapid alternating movements normal in all extremities. Finger-to-nose and heel-to-shin performed accurately bilaterally.  Gait and Station: Arises from chair without difficulty. Stance is normal. Gait demonstrates normal stride length and balance . Not able to heel, toe and tandem walk without difficulty. Romberg negative.  Reflexes: 1+ and symmetric.     ASSESSMENT AND PLAN 80- year-old lady with left MCA branch infarct in May 2013 from atrial fibrillation continues to do well on anticoagulation with warfarin.  PLAN:  I had a long discussion with patient regarding her remote stroke, stroke risk factors, strategies for seconds she prevention and answered questions. I personally reviewed her carotid Doppler studies and discuss the need for yearly followup with her.Continue warfarin  for secondary stroke prevention and maintain strict control of hypertension with blood pressure goal below 130/90, diabetes with hemoglobin A1c goal below 6.5% and lipids with LDL cholesterol goal below 100 mg/dL. Followup in the future with Charlott Holler, NP in 6 months.Antony Contras, MD  03/23/2014, 11:58 AM [facility] [address] [place], [place] [address] [phone]  Note: This document was prepared with digital dictation and possible smart phrase technology. Any transcriptional errors that result from this process are unintentional.

## 2014-03-28 ENCOUNTER — Encounter: Payer: Self-pay | Admitting: Cardiology

## 2014-03-28 ENCOUNTER — Ambulatory Visit (INDEPENDENT_AMBULATORY_CARE_PROVIDER_SITE_OTHER): Payer: Medicare Other | Admitting: Cardiology

## 2014-03-28 VITALS — BP 137/72 | HR 62 | Ht 66.0 in | Wt 178.0 lb

## 2014-03-28 DIAGNOSIS — I2510 Atherosclerotic heart disease of native coronary artery without angina pectoris: Secondary | ICD-10-CM | POA: Diagnosis not present

## 2014-03-28 DIAGNOSIS — I10 Essential (primary) hypertension: Secondary | ICD-10-CM

## 2014-03-28 DIAGNOSIS — I4891 Unspecified atrial fibrillation: Secondary | ICD-10-CM

## 2014-03-28 DIAGNOSIS — I6350 Cerebral infarction due to unspecified occlusion or stenosis of unspecified cerebral artery: Secondary | ICD-10-CM

## 2014-03-28 DIAGNOSIS — E785 Hyperlipidemia, unspecified: Secondary | ICD-10-CM

## 2014-03-28 DIAGNOSIS — I482 Chronic atrial fibrillation, unspecified: Secondary | ICD-10-CM

## 2014-03-28 NOTE — Patient Instructions (Signed)

## 2014-03-28 NOTE — Progress Notes (Signed)
Clinical Summary Ms. Doane is an 80 y.o.female last seen in the office by Dr. Percival Spanish in January 2014, a former patient of Dr. Dannielle Burn. She states that over the last year she has been more short of breath with household chores, no exertional chest pain or sense of palpitations however. ECG today shows rate-controlled atrial fibrillation with decreased R-wave progression and diffuse nonspecific ST-T changes, IVCD. She reports no major change in her medical regimen. She does indicate that she had an echocardiogram done through [facility] that showed some abnormalities - mentions an "aneurysm."  Lexiscan Cardiolite from March 2013 showed no diagnostic ST segment abnormalities, anterior attenuation without scar or ischemia, LVEF 72%. Echocardiogram from May 2013 revealed mild LVH with LVEF 60-65%, mild mitral regurgitation, mild to moderate left atrial enlargement, mild right atrial enlargement.  Carotid Dopplers in March 2015 showed no hemodynamically significant ICA stenoses with heterogeneous plaque within the right bulb and mild wall thickening bilaterally.   Allergies  Allergen Reactions  . Sulfonamide Derivatives Rash    Current Outpatient Prescriptions  Medication Sig Dispense Refill  . atenolol (TENORMIN) 50 MG tablet Take 50 mg by mouth daily.        Marland Kitchen atorvastatin (LIPITOR) 20 MG tablet Take 20 mg by mouth daily.      . Biotin 1000 MCG tablet Take 1,000 mcg by mouth daily.      . Calcium Carbonate-Vitamin D (CALTRATE 600+D) 600-400 MG-UNIT per tablet Take 1 tablet by mouth daily.        . citalopram (CELEXA) 20 MG tablet Take 20 mg by mouth daily.      Marland Kitchen diltiazem (CARDIZEM CD) 240 MG 24 hr capsule Take 240 mg by mouth daily.      Marland Kitchen losartan (COZAAR) 100 MG tablet Take 100 mg by mouth daily.      . predniSONE (DELTASONE) 10 MG tablet Take 10 mg by mouth daily with breakfast. Patient takes half tab daily (5mg )      . warfarin (COUMADIN) 4 MG tablet Take 2-4 mg by mouth daily. Takes  2 mg for one day then 4 mg for 3 days. Repeat.       No current facility-administered medications for this visit.    Past Medical History  Diagnosis Date  . Polymyalgia rheumatica   . Stroke   . Essential hypertension, benign   . Hyperlipidemia   . Paroxysmal atrial fibrillation     On Coumadin  . Coronary atherosclerosis of native coronary artery     Nonobstructive at cardiac catheterization 2004  . Dysphagia     Past Surgical History  Procedure Laterality Date  . Total abdominal hysterectomy    . Appendectomy    . Left ankle    . Toe surgery    . Colonoscopy  2008    Dr.Fleshiman    Family History  Problem Relation Age of Onset  . Coronary artery disease Other   . Stroke Mother   . Hypertension Sister   . Hypertension Brother   . Rheum arthritis Daughter   . Healthy Son   . Healthy Son   . Healthy Son   . Hypertension Sister   . Heart disease Sister   . Heart disease Brother   . Hypertension Brother   . Arthritis Brother   . Colon cancer Brother   . Hypertension Brother     Social History Ms. Gail reports that she has never smoked. She has never used smokeless tobacco. Ms. Romano reports that she does  not drink alcohol.  Review of Systems Has chronic arthritic pains, now on a course of prednisone. Stable appetite. Decreased energy overall. Other systems reviewed and negative except as outlined.  Physical Examination Filed Vitals:   03/28/14 0926  BP: 137/72  Pulse: 62   Filed Weights   03/28/14 0926  Weight: 178 lb (80.74 kg)   Overweight elderly woman, appears comfortable at rest. HEENT: Conjunctiva and lids normal, oropharynx clear. Neck: Supple, no elevated JVP, no thyromegaly. Lungs: Clear to auscultation, nonlabored breathing at rest. Cardiac: Irregularly irregular, no S3, soft systolic murmur, no pericardial rub. Abdomen: Soft, nontender, bowel sounds present, no guarding or rebound. Extremities: No pitting edema, distal pulses  2+. Skin: Warm and dry. Musculoskeletal: No kyphosis. Neuropsychiatric: Alert and oriented x3, affect grossly appropriate.   Problem List and Plan   Chronic atrial fibrillation Rate controlled at rest, although I wonder is some of her dyspnea on exertion is related more to this diagnosis than to ischemic heart disease. ECG overall stable. She continues on Coumadin, followed by [facility]. No change in rate control regimen for now. Will request most recent echocardiogram results to review.  Essential hypertension, benign No change to current regimen.  Coronary atherosclerosis of native coronary artery History of nonobstructive disease, negative ischemic workup within the last 2 years.  HYPERLIPIDEMIA On Lipitor, followed by Dr. Quillian Quince.    Satira Sark, M.D., F.A.C.C.

## 2014-03-28 NOTE — Assessment & Plan Note (Signed)
No change to current regimen. 

## 2014-03-28 NOTE — Assessment & Plan Note (Signed)
Rate controlled at rest, although I wonder is some of her dyspnea on exertion is related more to this diagnosis than to ischemic heart disease. ECG overall stable. She continues on Coumadin, followed by [facility]. No change in rate control regimen for now. Will request most recent echocardiogram results to review.

## 2014-03-28 NOTE — Assessment & Plan Note (Signed)
On Lipitor, followed by Dr. Quillian Quince.

## 2014-03-28 NOTE — Assessment & Plan Note (Signed)
History of nonobstructive disease, negative ischemic workup within the last 2 years.

## 2014-04-04 ENCOUNTER — Telehealth: Payer: Self-pay | Admitting: *Deleted

## 2014-04-04 NOTE — Telephone Encounter (Signed)
Patient informed. 

## 2014-04-04 NOTE — Telephone Encounter (Signed)
Message copied by Merlene Laughter on Mon Apr 04, 2014  3:29 PM ------      Message from: MCDOWELL, Aloha Gell      Created: Mon Mar 28, 2014  3:10 PM       Reviewed. Please let her know that I went over the report. She has normal LVEF, no wall motion abnormalities to suggest prior infarct. I do not see any mention of "aneurysm" which she had been concerned about during our visit discussion today. She does have a calcified aortic valve that is mildly stenotic which may be what the nurse was trying to explain to her previously on review of the report. This would not be a symptom provoking problem however at this point. We can follow her with an echocardiogram down the road. ------

## 2014-04-12 DIAGNOSIS — E782 Mixed hyperlipidemia: Secondary | ICD-10-CM | POA: Diagnosis not present

## 2014-04-12 DIAGNOSIS — R7309 Other abnormal glucose: Secondary | ICD-10-CM | POA: Diagnosis not present

## 2014-04-12 DIAGNOSIS — K2100 Gastro-esophageal reflux disease with esophagitis, without bleeding: Secondary | ICD-10-CM | POA: Diagnosis not present

## 2014-04-12 DIAGNOSIS — R5383 Other fatigue: Secondary | ICD-10-CM | POA: Diagnosis not present

## 2014-04-12 DIAGNOSIS — I10 Essential (primary) hypertension: Secondary | ICD-10-CM | POA: Diagnosis not present

## 2014-04-12 DIAGNOSIS — R5381 Other malaise: Secondary | ICD-10-CM | POA: Diagnosis not present

## 2014-04-12 DIAGNOSIS — N183 Chronic kidney disease, stage 3 unspecified: Secondary | ICD-10-CM | POA: Diagnosis not present

## 2014-04-13 DIAGNOSIS — I4891 Unspecified atrial fibrillation: Secondary | ICD-10-CM | POA: Diagnosis not present

## 2014-04-15 DIAGNOSIS — Z23 Encounter for immunization: Secondary | ICD-10-CM | POA: Diagnosis not present

## 2014-04-15 DIAGNOSIS — R269 Unspecified abnormalities of gait and mobility: Secondary | ICD-10-CM | POA: Diagnosis not present

## 2014-04-15 DIAGNOSIS — E782 Mixed hyperlipidemia: Secondary | ICD-10-CM | POA: Diagnosis not present

## 2014-04-15 DIAGNOSIS — I4891 Unspecified atrial fibrillation: Secondary | ICD-10-CM | POA: Diagnosis not present

## 2014-04-15 DIAGNOSIS — N183 Chronic kidney disease, stage 3 unspecified: Secondary | ICD-10-CM | POA: Diagnosis not present

## 2014-04-15 DIAGNOSIS — I10 Essential (primary) hypertension: Secondary | ICD-10-CM | POA: Diagnosis not present

## 2014-04-15 DIAGNOSIS — R5383 Other fatigue: Secondary | ICD-10-CM | POA: Diagnosis not present

## 2014-04-15 DIAGNOSIS — G44309 Post-traumatic headache, unspecified, not intractable: Secondary | ICD-10-CM | POA: Diagnosis not present

## 2014-04-15 DIAGNOSIS — R5381 Other malaise: Secondary | ICD-10-CM | POA: Diagnosis not present

## 2014-04-28 ENCOUNTER — Ambulatory Visit: Payer: Medicare Other | Admitting: Neurology

## 2014-04-29 DIAGNOSIS — R197 Diarrhea, unspecified: Secondary | ICD-10-CM | POA: Diagnosis not present

## 2014-04-30 DIAGNOSIS — R197 Diarrhea, unspecified: Secondary | ICD-10-CM | POA: Diagnosis not present

## 2014-05-16 DIAGNOSIS — L570 Actinic keratosis: Secondary | ICD-10-CM | POA: Diagnosis not present

## 2014-05-16 DIAGNOSIS — Z85828 Personal history of other malignant neoplasm of skin: Secondary | ICD-10-CM | POA: Diagnosis not present

## 2014-05-17 DIAGNOSIS — Z23 Encounter for immunization: Secondary | ICD-10-CM | POA: Diagnosis not present

## 2014-05-17 DIAGNOSIS — I4891 Unspecified atrial fibrillation: Secondary | ICD-10-CM | POA: Diagnosis not present

## 2014-05-20 DIAGNOSIS — I4891 Unspecified atrial fibrillation: Secondary | ICD-10-CM | POA: Diagnosis not present

## 2014-05-23 DIAGNOSIS — Z0289 Encounter for other administrative examinations: Secondary | ICD-10-CM

## 2014-06-16 DIAGNOSIS — I482 Chronic atrial fibrillation: Secondary | ICD-10-CM | POA: Diagnosis not present

## 2014-06-21 DIAGNOSIS — I482 Chronic atrial fibrillation: Secondary | ICD-10-CM | POA: Diagnosis not present

## 2014-06-21 DIAGNOSIS — E782 Mixed hyperlipidemia: Secondary | ICD-10-CM | POA: Diagnosis not present

## 2014-06-21 DIAGNOSIS — I350 Nonrheumatic aortic (valve) stenosis: Secondary | ICD-10-CM | POA: Diagnosis not present

## 2014-06-21 DIAGNOSIS — I10 Essential (primary) hypertension: Secondary | ICD-10-CM | POA: Diagnosis not present

## 2014-06-21 DIAGNOSIS — M1612 Unilateral primary osteoarthritis, left hip: Secondary | ICD-10-CM | POA: Diagnosis not present

## 2014-06-21 DIAGNOSIS — K210 Gastro-esophageal reflux disease with esophagitis: Secondary | ICD-10-CM | POA: Diagnosis not present

## 2014-06-21 DIAGNOSIS — I5032 Chronic diastolic (congestive) heart failure: Secondary | ICD-10-CM | POA: Diagnosis not present

## 2014-06-21 DIAGNOSIS — J302 Other seasonal allergic rhinitis: Secondary | ICD-10-CM | POA: Diagnosis not present

## 2014-06-28 DIAGNOSIS — I482 Chronic atrial fibrillation: Secondary | ICD-10-CM | POA: Diagnosis not present

## 2014-07-04 ENCOUNTER — Encounter (INDEPENDENT_AMBULATORY_CARE_PROVIDER_SITE_OTHER): Payer: Self-pay | Admitting: Internal Medicine

## 2014-07-04 ENCOUNTER — Ambulatory Visit (INDEPENDENT_AMBULATORY_CARE_PROVIDER_SITE_OTHER): Payer: Medicare Other | Admitting: Internal Medicine

## 2014-07-04 VITALS — BP 140/60 | HR 64 | Temp 97.4°F | Ht 67.0 in | Wt 176.9 lb

## 2014-07-04 DIAGNOSIS — K529 Noninfective gastroenteritis and colitis, unspecified: Secondary | ICD-10-CM | POA: Diagnosis not present

## 2014-07-04 DIAGNOSIS — I639 Cerebral infarction, unspecified: Secondary | ICD-10-CM

## 2014-07-04 NOTE — Patient Instructions (Signed)
IBGARD x 4 boxes. OV in 6 weeks with a stool dairy

## 2014-07-04 NOTE — Progress Notes (Signed)
Subjective:    Patient ID: [name] [name], female    DOB: 03-18-1930, 80 y.o.   MRN: [id number]  HPI Presents today with c/o diarrhea. She was last seen in November of last year by Dr. Laural Golden.  She tells me she has diarrhea and cramps. Her stools are very watery.  She takes Imodium on a prn basis. She tells me Imoidum will constipate her.  Her stools are green and grainy.  She has had diarrhea off and on all her life. She has not seen any melena or BRRBs. She is having at least twice in am and two in the evening. She does have urgency. She has increased fiber in her diet. Per patient, stool studies were negative.  Appetite is good. No weight loss.     Colonoscopy 10/24/2008 Dr. Lindalou Hose:  Family hx of colon cancer: Tortuous,reduntant colon, moderate diverticula in sigmoid colon, otherwise normal.   Review of Systems Past Medical History  Diagnosis Date  . Polymyalgia rheumatica   . Stroke   . Essential hypertension, benign   . Hyperlipidemia   . Paroxysmal atrial fibrillation     On Coumadin  . Coronary atherosclerosis of native coronary artery     Nonobstructive at cardiac catheterization 2004  . Dysphagia     Past Surgical History  Procedure Laterality Date  . Total abdominal hysterectomy    . Appendectomy    . Left ankle    . Toe surgery    . Colonoscopy  2008    Dr.Fleshiman    Allergies  Allergen Reactions  . Sulfonamide Derivatives Rash    Current Outpatient Prescriptions on File Prior to Visit  Medication Sig Dispense Refill  . atenolol (TENORMIN) 50 MG tablet Take 50 mg by mouth daily.      Marland Kitchen atorvastatin (LIPITOR) 20 MG tablet Take 20 mg by mouth daily.    . Biotin 1000 MCG tablet Take 1,000 mcg by mouth daily.    . Calcium Carbonate-Vitamin D (CALTRATE 600+D) 600-400 MG-UNIT per tablet Take 1 tablet by mouth daily.      . citalopram (CELEXA) 20 MG tablet Take 20 mg by mouth daily.    Marland Kitchen diltiazem (CARDIZEM CD) 240 MG 24 hr capsule Take 240 mg by mouth daily.     Marland Kitchen losartan (COZAAR) 100 MG tablet Take 100 mg by mouth daily.    . predniSONE (DELTASONE) 10 MG tablet Take 10 mg by mouth daily with breakfast. Patient takes half tab daily (5mg )    . warfarin (COUMADIN) 4 MG tablet Take 2 mg by mouth daily.      No current facility-administered medications on file prior to visit.     Family history; Father lived to be 62. Mother had surgery for colon carcinoma at age 10 and died of unrelated causes at 59. She had 8 siblings. Only two sisters and one brother are living. One brother was diagnosed with metastatic colon carcinoma at age 68 and died within 56 months.  Social history; She is married and has 4 children. Her daughter has rheumatoid arthritis and her 3 sons are in good health. She worked for over 46 years. 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      Objective:   Physical Exam  Filed Vitals:   07/04/14 1142  Height: 5\' 7"  (1.702 m)  Weight: 176 lb 14.4 oz (80.241 kg)  Alert and oriented. Skin  warm and dry. Oral mucosa is moist.   . Sclera anicteric, conjunctivae is pink. Thyroid not enlarged. No cervical lymphadenopathy. Lungs clear. Heart regular rate and rhythm.  Abdomen is soft. Bowel sounds are positive. No hepatomegaly. No abdominal masses felt. No tenderness.  No edema to lower extremities.           Assessment & Plan:  Chronic diarrhea. IBGARD x 4 boxes. OV in 6 weeks. Stool diary.

## 2014-07-06 ENCOUNTER — Encounter: Payer: Self-pay | Admitting: Neurology

## 2014-07-12 DIAGNOSIS — I482 Chronic atrial fibrillation: Secondary | ICD-10-CM | POA: Diagnosis not present

## 2014-07-18 DIAGNOSIS — I482 Chronic atrial fibrillation: Secondary | ICD-10-CM | POA: Diagnosis not present

## 2014-07-20 ENCOUNTER — Encounter (INDEPENDENT_AMBULATORY_CARE_PROVIDER_SITE_OTHER): Payer: Self-pay

## 2014-07-27 DIAGNOSIS — I4891 Unspecified atrial fibrillation: Secondary | ICD-10-CM | POA: Diagnosis not present

## 2014-07-27 DIAGNOSIS — Z7901 Long term (current) use of anticoagulants: Secondary | ICD-10-CM | POA: Diagnosis not present

## 2014-07-27 DIAGNOSIS — N183 Chronic kidney disease, stage 3 (moderate): Secondary | ICD-10-CM | POA: Diagnosis not present

## 2014-07-27 DIAGNOSIS — M2550 Pain in unspecified joint: Secondary | ICD-10-CM | POA: Diagnosis not present

## 2014-07-27 DIAGNOSIS — I7090 Unspecified atherosclerosis: Secondary | ICD-10-CM | POA: Diagnosis not present

## 2014-07-27 DIAGNOSIS — Z8673 Personal history of transient ischemic attack (TIA), and cerebral infarction without residual deficits: Secondary | ICD-10-CM | POA: Diagnosis not present

## 2014-07-27 DIAGNOSIS — M069 Rheumatoid arthritis, unspecified: Secondary | ICD-10-CM | POA: Diagnosis not present

## 2014-07-27 DIAGNOSIS — Z79899 Other long term (current) drug therapy: Secondary | ICD-10-CM | POA: Diagnosis not present

## 2014-07-27 DIAGNOSIS — I129 Hypertensive chronic kidney disease with stage 1 through stage 4 chronic kidney disease, or unspecified chronic kidney disease: Secondary | ICD-10-CM | POA: Diagnosis not present

## 2014-07-27 DIAGNOSIS — Z882 Allergy status to sulfonamides status: Secondary | ICD-10-CM | POA: Diagnosis not present

## 2014-08-08 DIAGNOSIS — M545 Low back pain: Secondary | ICD-10-CM | POA: Diagnosis not present

## 2014-08-08 DIAGNOSIS — M5136 Other intervertebral disc degeneration, lumbar region: Secondary | ICD-10-CM | POA: Diagnosis not present

## 2014-08-15 ENCOUNTER — Other Ambulatory Visit (INDEPENDENT_AMBULATORY_CARE_PROVIDER_SITE_OTHER): Payer: Self-pay | Admitting: *Deleted

## 2014-08-15 ENCOUNTER — Ambulatory Visit (INDEPENDENT_AMBULATORY_CARE_PROVIDER_SITE_OTHER): Payer: Medicare Other | Admitting: Internal Medicine

## 2014-08-15 ENCOUNTER — Telehealth (INDEPENDENT_AMBULATORY_CARE_PROVIDER_SITE_OTHER): Payer: Self-pay | Admitting: *Deleted

## 2014-08-15 ENCOUNTER — Encounter (INDEPENDENT_AMBULATORY_CARE_PROVIDER_SITE_OTHER): Payer: Self-pay | Admitting: Internal Medicine

## 2014-08-15 VITALS — BP 122/60 | HR 64 | Temp 97.2°F | Ht 66.5 in | Wt 176.7 lb

## 2014-08-15 DIAGNOSIS — Z800 Family history of malignant neoplasm of digestive organs: Secondary | ICD-10-CM

## 2014-08-15 DIAGNOSIS — K625 Hemorrhage of anus and rectum: Secondary | ICD-10-CM | POA: Diagnosis not present

## 2014-08-15 DIAGNOSIS — I639 Cerebral infarction, unspecified: Secondary | ICD-10-CM

## 2014-08-15 DIAGNOSIS — M2560 Stiffness of unspecified joint, not elsewhere classified: Secondary | ICD-10-CM | POA: Diagnosis not present

## 2014-08-15 DIAGNOSIS — M5136 Other intervertebral disc degeneration, lumbar region: Secondary | ICD-10-CM | POA: Diagnosis not present

## 2014-08-15 DIAGNOSIS — M545 Low back pain: Secondary | ICD-10-CM | POA: Diagnosis not present

## 2014-08-15 DIAGNOSIS — M6281 Muscle weakness (generalized): Secondary | ICD-10-CM | POA: Diagnosis not present

## 2014-08-15 MED ORDER — PEG 3350-KCL-NA BICARB-NACL 420 G PO SOLR: 4000.0000 mL | Freq: Once | ORAL | Status: DC

## 2014-08-15 NOTE — Patient Instructions (Signed)
Colonoscopy. The risks and benefits such as perforation, bleeding, and infection were reviewed with the patient and is agreeable.The risks and benefits such as perforation, bleeding, and infection were reviewed with the patient and is agreeable. 

## 2014-08-15 NOTE — Progress Notes (Signed)
Subjective:    Patient ID: [name] [name], female    DOB: 15-Dec-1929, 80 y.o.   MRN: [id number]  HPI  Here today for f/u of her diarrhea. She was seen last month for this complaint. Asked to keep a stool diary and to increase fiber in her diet. Samples of  IBGARD given to patient.  She tells me the stomach cramps are better. She is not going to the BR everytime she eats. She does have diarrhea in the morning.  She still usually has 2 BMs in the am.  There is no urgency. She has increased in her diet.  She had stool studies by Dr. Arcola Jansky in October but was negative. She cannot take Imodium because it will constipate her. She has diarrhea off and on all her adult life. Today she tells me she saw blood in her stool. Her stool was not hard. She tells me two weeks ago she had rectal bleeding and she put a pad on. She says it looked like a lot in the commode. She says she sees blood frequently in the commode and she describes as a small amount.  Family hx of colon cancer in a mother and brother. Mother age 58, brother age 67 and now deceased.        Colonoscopy November 01, 2008 Dr. Lindalou Hose: Family hx of colon cancer:  Tortuous,reduntant colon, moderate diverticula in sigmoid colon, otherwise normal.     Review of Systems     Past Medical History  Diagnosis Date  . Polymyalgia rheumatica   . Stroke   . Essential hypertension, benign   . Hyperlipidemia   . Paroxysmal atrial fibrillation     On Coumadin  . Coronary atherosclerosis of native coronary artery     Nonobstructive at cardiac catheterization 2004  . Dysphagia     Past Surgical History  Procedure Laterality Date  . Total abdominal hysterectomy    . Appendectomy    . Left ankle    . Toe surgery    . Colonoscopy  2008    Dr.Fleshiman    Allergies  Allergen Reactions  . Sulfonamide Derivatives Rash    Current Outpatient Prescriptions on File Prior to Visit  Medication Sig Dispense Refill  . atenolol (TENORMIN) 50  MG tablet Take 50 mg by mouth daily.      Marland Kitchen atorvastatin (LIPITOR) 20 MG tablet Take 20 mg by mouth daily.    . Biotin 1000 MCG tablet Take 1,000 mcg by mouth daily.    . Calcium Carbonate-Vitamin D (CALTRATE 600+D) 600-400 MG-UNIT per tablet Take 1 tablet by mouth daily.      . citalopram (CELEXA) 20 MG tablet Take 20 mg by mouth daily.    Marland Kitchen diltiazem (CARDIZEM CD) 240 MG 24 hr capsule Take 240 mg by mouth daily.    Marland Kitchen losartan (COZAAR) 100 MG tablet Take 100 mg by mouth daily.    . predniSONE (DELTASONE) 10 MG tablet Take 5 mg by mouth daily with breakfast. Patient takes half tab daily (5mg )    . warfarin (COUMADIN) 4 MG tablet Take 2 mg by mouth daily. 4mg  one and then 2mg  the next day and this alternates.     No current facility-administered medications on file prior to visit.     Objective:   Physical Exam  Filed Vitals:   08/15/14 1114  Height: 5' 6.5" (1.689 m)  Weight: 176 lb 11.2 oz (80.151 kg)   Alert and oriented. Skin warm and dry. Oral mucosa is moist.   .  Sclera anicteric, conjunctivae is pink. Thyroid not enlarged. No cervical lymphadenopathy. Lungs clear. Heart regular rate and rhythm.  Abdomen is soft. Bowel sounds are positive. No hepatomegaly. No abdominal masses felt. No tenderness.  No edema to lower extremities.           Assessment & Plan:  Diarrhea: chronic. Is better after starting IBGARD and increasing fiber. She has had some rectal bleeding with her BMs. Family hx of colon cancer. Colonic neoplasm needs to be ruled out. Colonoscopy:The risks and benefits such as perforation, bleeding, and infection were reviewed with the patient and is agreeable.

## 2014-08-15 NOTE — Telephone Encounter (Signed)
Patient needs trilyte 

## 2014-08-17 DIAGNOSIS — M2560 Stiffness of unspecified joint, not elsewhere classified: Secondary | ICD-10-CM | POA: Diagnosis not present

## 2014-08-17 DIAGNOSIS — M5136 Other intervertebral disc degeneration, lumbar region: Secondary | ICD-10-CM | POA: Diagnosis not present

## 2014-08-17 DIAGNOSIS — I482 Chronic atrial fibrillation: Secondary | ICD-10-CM | POA: Diagnosis not present

## 2014-08-17 DIAGNOSIS — M545 Low back pain: Secondary | ICD-10-CM | POA: Diagnosis not present

## 2014-08-17 DIAGNOSIS — M6281 Muscle weakness (generalized): Secondary | ICD-10-CM | POA: Diagnosis not present

## 2014-08-18 DIAGNOSIS — M2560 Stiffness of unspecified joint, not elsewhere classified: Secondary | ICD-10-CM | POA: Diagnosis not present

## 2014-08-18 DIAGNOSIS — M6281 Muscle weakness (generalized): Secondary | ICD-10-CM | POA: Diagnosis not present

## 2014-08-18 DIAGNOSIS — M545 Low back pain: Secondary | ICD-10-CM | POA: Diagnosis not present

## 2014-08-18 DIAGNOSIS — M5136 Other intervertebral disc degeneration, lumbar region: Secondary | ICD-10-CM | POA: Diagnosis not present

## 2014-08-22 DIAGNOSIS — M545 Low back pain: Secondary | ICD-10-CM | POA: Diagnosis not present

## 2014-08-22 DIAGNOSIS — M6281 Muscle weakness (generalized): Secondary | ICD-10-CM | POA: Diagnosis not present

## 2014-08-22 DIAGNOSIS — M5136 Other intervertebral disc degeneration, lumbar region: Secondary | ICD-10-CM | POA: Diagnosis not present

## 2014-08-22 DIAGNOSIS — M2560 Stiffness of unspecified joint, not elsewhere classified: Secondary | ICD-10-CM | POA: Diagnosis not present

## 2014-08-25 DIAGNOSIS — M5136 Other intervertebral disc degeneration, lumbar region: Secondary | ICD-10-CM | POA: Diagnosis not present

## 2014-08-25 DIAGNOSIS — M6281 Muscle weakness (generalized): Secondary | ICD-10-CM | POA: Diagnosis not present

## 2014-08-25 DIAGNOSIS — M545 Low back pain: Secondary | ICD-10-CM | POA: Diagnosis not present

## 2014-08-25 DIAGNOSIS — M2560 Stiffness of unspecified joint, not elsewhere classified: Secondary | ICD-10-CM | POA: Diagnosis not present

## 2014-08-31 DIAGNOSIS — M545 Low back pain: Secondary | ICD-10-CM | POA: Diagnosis not present

## 2014-08-31 DIAGNOSIS — M5136 Other intervertebral disc degeneration, lumbar region: Secondary | ICD-10-CM | POA: Diagnosis not present

## 2014-09-07 ENCOUNTER — Ambulatory Visit (HOSPITAL_COMMUNITY): Admission: RE | Admit: 2014-09-07 | Payer: Medicare Other | Source: Ambulatory Visit | Admitting: Internal Medicine

## 2014-09-07 ENCOUNTER — Encounter (HOSPITAL_COMMUNITY): Admission: RE | Payer: Self-pay | Source: Ambulatory Visit

## 2014-09-07 SURGERY — COLONOSCOPY
Anesthesia: Moderate Sedation

## 2014-09-08 DIAGNOSIS — I482 Chronic atrial fibrillation: Secondary | ICD-10-CM | POA: Diagnosis not present

## 2014-09-08 DIAGNOSIS — N183 Chronic kidney disease, stage 3 (moderate): Secondary | ICD-10-CM | POA: Diagnosis not present

## 2014-09-08 DIAGNOSIS — E782 Mixed hyperlipidemia: Secondary | ICD-10-CM | POA: Diagnosis not present

## 2014-09-08 DIAGNOSIS — K210 Gastro-esophageal reflux disease with esophagitis: Secondary | ICD-10-CM | POA: Diagnosis not present

## 2014-09-08 DIAGNOSIS — I10 Essential (primary) hypertension: Secondary | ICD-10-CM | POA: Diagnosis not present

## 2014-09-15 DIAGNOSIS — I10 Essential (primary) hypertension: Secondary | ICD-10-CM | POA: Diagnosis not present

## 2014-09-15 DIAGNOSIS — R300 Dysuria: Secondary | ICD-10-CM | POA: Diagnosis not present

## 2014-09-15 DIAGNOSIS — I482 Chronic atrial fibrillation: Secondary | ICD-10-CM | POA: Diagnosis not present

## 2014-09-15 DIAGNOSIS — M1612 Unilateral primary osteoarthritis, left hip: Secondary | ICD-10-CM | POA: Diagnosis not present

## 2014-09-15 DIAGNOSIS — E782 Mixed hyperlipidemia: Secondary | ICD-10-CM | POA: Diagnosis not present

## 2014-09-15 DIAGNOSIS — F331 Major depressive disorder, recurrent, moderate: Secondary | ICD-10-CM | POA: Diagnosis not present

## 2014-09-15 DIAGNOSIS — N183 Chronic kidney disease, stage 3 (moderate): Secondary | ICD-10-CM | POA: Diagnosis not present

## 2014-09-15 DIAGNOSIS — I350 Nonrheumatic aortic (valve) stenosis: Secondary | ICD-10-CM | POA: Diagnosis not present

## 2014-09-15 DIAGNOSIS — K210 Gastro-esophageal reflux disease with esophagitis: Secondary | ICD-10-CM | POA: Diagnosis not present

## 2014-09-15 DIAGNOSIS — I5032 Chronic diastolic (congestive) heart failure: Secondary | ICD-10-CM | POA: Diagnosis not present

## 2014-09-15 DIAGNOSIS — Z9189 Other specified personal risk factors, not elsewhere classified: Secondary | ICD-10-CM | POA: Diagnosis not present

## 2014-09-22 ENCOUNTER — Ambulatory Visit: Payer: Medicare Other | Admitting: Neurology

## 2014-09-22 ENCOUNTER — Ambulatory Visit (INDEPENDENT_AMBULATORY_CARE_PROVIDER_SITE_OTHER): Payer: Medicare Other | Admitting: Cardiology

## 2014-09-22 ENCOUNTER — Encounter: Payer: Self-pay | Admitting: Cardiology

## 2014-09-22 VITALS — BP 126/76 | HR 61 | Ht 66.0 in | Wt 175.8 lb

## 2014-09-22 DIAGNOSIS — I482 Chronic atrial fibrillation, unspecified: Secondary | ICD-10-CM

## 2014-09-22 DIAGNOSIS — I2510 Atherosclerotic heart disease of native coronary artery without angina pectoris: Secondary | ICD-10-CM

## 2014-09-22 NOTE — Assessment & Plan Note (Signed)
Symptomatically well controlled on current regimen including atenolol, Cardizem CD, and Coumadin which is followed by Dr. Quillian Quince. No changes made today.

## 2014-09-22 NOTE — Patient Instructions (Signed)
Your physician recommends that you schedule a follow-up appointment in: 6 months. You will receive a reminder letter in the mail in 4 about months reminding you to call and schedule your appointment. If you don't receive this letter, please contact our office. Your physician recommends that you continue on your current medications as directed. Please refer to the Current Medication list given to you today.

## 2014-09-22 NOTE — Assessment & Plan Note (Signed)
No angina symptoms with history of nonobstructive disease and negative Cardiolite from 2013. She continued on beta blocker, ARB and statin.

## 2014-09-22 NOTE — Progress Notes (Signed)
Cardiology Office Note  Date: 09/22/2014   ID: [name] [name], DOB Jul 23, 1930, MRN [id number]  PCP: Gar Ponto, MD  Primary Cardiologist: Rozann Lesches, MD   Chief Complaint  Patient presents with  . PAF  . Coronary Artery Disease    History of Present Illness: [name] [name] is an 81 y.o. female last seen in August 2015. She presents for a routine visit today. She does not report any progressive shortness of breath, no chest pain, rare palpitations. Medications include atenolol, Cardizem CD, and Coumadin which is followed by Dr. Quillian Quince. She denies any spontaneous bleeding problems.  Subsequent to the last visit I was able to review echocardiogram report done at [place]. The study showed normal LVEF with no wall motion abnormalities, mildly stenotic aortic valve which would be unlikely to be causing symptoms.   Past Medical History  Diagnosis Date  . Polymyalgia rheumatica   . Stroke   . Essential hypertension, benign   . Hyperlipidemia   . Paroxysmal atrial fibrillation     On Coumadin  . Coronary atherosclerosis of native coronary artery     Nonobstructive at cardiac catheterization 2004  . Dysphagia     Current Outpatient Prescriptions  Medication Sig Dispense Refill  . atenolol (TENORMIN) 50 MG tablet Take 50 mg by mouth daily.      Marland Kitchen atorvastatin (LIPITOR) 40 MG tablet Take 40 mg by mouth daily.    . Biotin 1000 MCG tablet Take 1,000 mcg by mouth daily.    . Calcium Carbonate-Vitamin D (CALTRATE 600+D) 600-400 MG-UNIT per tablet Take 1 tablet by mouth daily.      . citalopram (CELEXA) 20 MG tablet Take 20 mg by mouth daily.    Marland Kitchen diltiazem (CARDIZEM CD) 240 MG 24 hr capsule Take 240 mg by mouth daily.    . furosemide (LASIX) 20 MG tablet Take 20 mg by mouth daily.    Marland Kitchen losartan (COZAAR) 100 MG tablet Take 100 mg by mouth daily.    . predniSONE (DELTASONE) 5 MG tablet Take 5 mg by mouth daily.    Marland Kitchen warfarin (COUMADIN) 4 MG tablet Take 2-4 mg by mouth daily. 4mg   one and then 2mg  the next day and this alternates. MANAGED BY PMD     No current facility-administered medications for this visit.    Allergies:  Sulfonamide derivatives   Social History: The patient  reports that she has never smoked. She has never used smokeless tobacco. She reports that she does not drink alcohol or use illicit drugs.   ROS:  Please see the history of present illness. Otherwise, complete review of systems is positive for interval "kidney infection."  All other systems are reviewed and negative.    PHYSICAL EXAM: VS:  BP 126/76 mmHg  Pulse 61  Ht 5\' 6"  (1.676 m)  Wt 175 lb 12.8 oz (79.742 kg)  BMI 28.39 kg/m2  SpO2 98%, BMI Body mass index is 28.39 kg/(m^2).  Wt Readings from Last 3 Encounters:  09/22/14 175 lb 12.8 oz (79.742 kg)  08/15/14 176 lb 11.2 oz (80.151 kg)  07/04/14 176 lb 14.4 oz (80.241 kg)     Overweight elderly woman, appears comfortable at rest. HEENT: Conjunctiva and lids normal, oropharynx clear. Neck: Supple, no elevated JVP, no thyromegaly. Lungs: Clear to auscultation, nonlabored breathing at rest. Cardiac: Irregularly irregular, no S3, soft systolic murmur, no pericardial rub. Abdomen: Soft, nontender, bowel sounds present, no guarding or rebound. Extremities: No pitting edema, distal pulses 2+.  ECG:  ECG is not ordered today.  Recent Labwork:  Lab work from August 2015 showed BUN 17, creatinine 1.3, potassium 3.7, normal LFTs, cholesterol 197, triglycerides 199, HDL 46, VLDL 40.  Other Studies Reviewed Today:  1. Lexiscan Cardiolite from March 2013 showed no diagnostic ST segment abnormalities, anterior attenuation without scar or ischemia, LVEF 72%.   2. Echocardiogram from May 2013 revealed mild LVH with LVEF 60-65%, mild mitral regurgitation, mild to moderate left atrial enlargement, mild right atrial enlargement.  ASSESSMENT AND PLAN:  Chronic atrial fibrillation Symptomatically well controlled on current regimen  including atenolol, Cardizem CD, and Coumadin which is followed by Dr. Quillian Quince. No changes made today.    Coronary atherosclerosis of native coronary artery No angina symptoms with history of nonobstructive disease and negative Cardiolite from 2013. She continued on beta blocker, ARB and statin.     Current medicines are reviewed at length with the patient today.  The patient does not have concerns regarding medicines.   Disposition: FU with me in 6 months.   Signed, Satira Sark, MD, [facility] 09/22/2014 11:11 AM    [facility] [place], [place], [place] [address] Phone: [phone]; Fax: [phone]

## 2014-09-23 ENCOUNTER — Ambulatory Visit: Payer: Medicare Other | Admitting: Nurse Practitioner

## 2014-10-01 DIAGNOSIS — M5136 Other intervertebral disc degeneration, lumbar region: Secondary | ICD-10-CM | POA: Diagnosis not present

## 2014-10-01 DIAGNOSIS — M545 Low back pain: Secondary | ICD-10-CM | POA: Diagnosis not present

## 2014-10-06 ENCOUNTER — Ambulatory Visit (INDEPENDENT_AMBULATORY_CARE_PROVIDER_SITE_OTHER): Payer: Medicare Other | Admitting: Adult Health

## 2014-10-06 ENCOUNTER — Encounter: Payer: Self-pay | Admitting: Adult Health

## 2014-10-06 VITALS — BP 139/60 | HR 67 | Ht 66.0 in | Wt 176.0 lb

## 2014-10-06 DIAGNOSIS — I6930 Unspecified sequelae of cerebral infarction: Secondary | ICD-10-CM | POA: Diagnosis not present

## 2014-10-06 DIAGNOSIS — I482 Chronic atrial fibrillation: Secondary | ICD-10-CM | POA: Diagnosis not present

## 2014-10-06 DIAGNOSIS — I2510 Atherosclerotic heart disease of native coronary artery without angina pectoris: Secondary | ICD-10-CM | POA: Diagnosis not present
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
## 2014-10-06 NOTE — Patient Instructions (Signed)
Continue warfarin for secondary stroke prevention and maintain strict control of hypertension with blood pressure goal below 130/90, diabetes with hemoglobin A1c goal below 6.5% and lipids with LDL cholesterol goal below 100 mg/dL.

## 2014-10-06 NOTE — Progress Notes (Signed)
I have read the note, and I agree with the clinical assessment and plan.  WILLIS,CHARLES KEITH   

## 2014-10-08 DIAGNOSIS — M5136 Other intervertebral disc degeneration, lumbar region: Secondary | ICD-10-CM | POA: Diagnosis not present

## 2014-10-17 DIAGNOSIS — M5136 Other intervertebral disc degeneration, lumbar region: Secondary | ICD-10-CM | POA: Diagnosis not present

## 2014-11-04 DIAGNOSIS — I482 Chronic atrial fibrillation: Secondary | ICD-10-CM | POA: Diagnosis not present

## 2014-11-18 DIAGNOSIS — H6121 Impacted cerumen, right ear: Secondary | ICD-10-CM | POA: Diagnosis not present

## 2014-11-18 DIAGNOSIS — H6691 Otitis media, unspecified, right ear: Secondary | ICD-10-CM | POA: Diagnosis not present

## 2014-12-12 DIAGNOSIS — I482 Chronic atrial fibrillation: Secondary | ICD-10-CM | POA: Diagnosis not present

## 2014-12-29 ENCOUNTER — Telehealth: Payer: Self-pay | Admitting: *Deleted

## 2014-12-29 ENCOUNTER — Emergency Department (HOSPITAL_COMMUNITY): Payer: Medicare Other

## 2014-12-29 ENCOUNTER — Inpatient Hospital Stay (HOSPITAL_COMMUNITY)
Admission: EM | Admit: 2014-12-29 | Discharge: 2014-12-31 | DRG: 293 | Disposition: A | Discharge status: Home / Self Care | Payer: Medicare Other | Attending: Internal Medicine | Admitting: Internal Medicine

## 2014-12-29 ENCOUNTER — Encounter (HOSPITAL_COMMUNITY): Payer: Self-pay | Admitting: *Deleted

## 2014-12-29 DIAGNOSIS — Z9111 Patient's noncompliance with dietary regimen: Secondary | ICD-10-CM | POA: Diagnosis present

## 2014-12-29 DIAGNOSIS — Z8673 Personal history of transient ischemic attack (TIA), and cerebral infarction without residual deficits: Secondary | ICD-10-CM | POA: Diagnosis not present

## 2014-12-29 DIAGNOSIS — Z7901 Long term (current) use of anticoagulants: Secondary | ICD-10-CM | POA: Diagnosis not present

## 2014-12-29 DIAGNOSIS — M353 Polymyalgia rheumatica: Secondary | ICD-10-CM | POA: Diagnosis present

## 2014-12-29 DIAGNOSIS — I10 Essential (primary) hypertension: Secondary | ICD-10-CM | POA: Diagnosis not present

## 2014-12-29 DIAGNOSIS — Z90710 Acquired absence of both cervix and uterus: Secondary | ICD-10-CM | POA: Diagnosis not present

## 2014-12-29 DIAGNOSIS — Z8249 Family history of ischemic heart disease and other diseases of the circulatory system: Secondary | ICD-10-CM

## 2014-12-29 DIAGNOSIS — Z7952 Long term (current) use of systemic steroids: Secondary | ICD-10-CM | POA: Diagnosis not present

## 2014-12-29 DIAGNOSIS — I2510 Atherosclerotic heart disease of native coronary artery without angina pectoris: Secondary | ICD-10-CM | POA: Diagnosis present

## 2014-12-29 DIAGNOSIS — E785 Hyperlipidemia, unspecified: Secondary | ICD-10-CM | POA: Diagnosis present

## 2014-12-29 DIAGNOSIS — I482 Chronic atrial fibrillation, unspecified: Secondary | ICD-10-CM | POA: Diagnosis present

## 2014-12-29 DIAGNOSIS — R0600 Dyspnea, unspecified: Secondary | ICD-10-CM

## 2014-12-29 DIAGNOSIS — Z800 Family history of malignant neoplasm of digestive organs: Secondary | ICD-10-CM

## 2014-12-29 DIAGNOSIS — I480 Paroxysmal atrial fibrillation: Secondary | ICD-10-CM | POA: Diagnosis present

## 2014-12-29 DIAGNOSIS — Z823 Family history of stroke: Secondary | ICD-10-CM | POA: Diagnosis not present

## 2014-12-29 DIAGNOSIS — R079 Chest pain, unspecified: Secondary | ICD-10-CM | POA: Diagnosis not present

## 2014-12-29 DIAGNOSIS — I25118 Atherosclerotic heart disease of native coronary artery with other forms of angina pectoris: Secondary | ICD-10-CM | POA: Diagnosis not present

## 2014-12-29 DIAGNOSIS — R0602 Shortness of breath: Secondary | ICD-10-CM | POA: Diagnosis not present

## 2014-12-29 DIAGNOSIS — I5031 Acute diastolic (congestive) heart failure: Principal | ICD-10-CM | POA: Diagnosis present

## 2014-12-29 DIAGNOSIS — R61 Generalized hyperhidrosis: Secondary | ICD-10-CM | POA: Diagnosis not present

## 2014-12-29 DIAGNOSIS — I4891 Unspecified atrial fibrillation: Secondary | ICD-10-CM | POA: Diagnosis not present

## 2014-12-29 LAB — BRAIN NATRIURETIC PEPTIDE: B Natriuretic Peptide: 396 pg/mL — ABNORMAL HIGH (ref 0.0–100.0)

## 2014-12-29 LAB — COMPREHENSIVE METABOLIC PANEL
ALT: 19 U/L (ref 14–54)
ANION GAP: 12 (ref 5–15)
AST: 24 U/L (ref 15–41)
Albumin: 4.4 g/dL (ref 3.5–5.0)
Alkaline Phosphatase: 77 U/L (ref 38–126)
BILIRUBIN TOTAL: 0.9 mg/dL (ref 0.3–1.2)
BUN: 15 mg/dL (ref 6–20)
CALCIUM: 9.6 mg/dL (ref 8.9–10.3)
CHLORIDE: 104 mmol/L (ref 101–111)
CO2: 22 mmol/L (ref 22–32)
Creatinine, Ser: 0.93 mg/dL (ref 0.44–1.00)
GFR calc non Af Amer: 56 mL/min — ABNORMAL LOW (ref >60)
Glucose, Bld: 133 mg/dL — ABNORMAL HIGH (ref 65–99)
Potassium: 3.6 mmol/L (ref 3.5–5.1)
Sodium: 138 mmol/L (ref 135–145)
Total Protein: 7.8 g/dL (ref 6.5–8.1)

## 2014-12-29 LAB — CBC WITH DIFFERENTIAL/PLATELET
Basophils Absolute: 0.1 10*3/uL (ref 0.0–0.1)
Basophils Relative: 1 % (ref 0–1)
EOS ABS: 0.1 10*3/uL (ref 0.0–0.7)
Eosinophils Relative: 1 % (ref 0–5)
HCT: 39.9 % (ref 36.0–46.0)
Hemoglobin: 13.7 g/dL (ref 12.0–15.0)
Lymphocytes Relative: 26 % (ref 12–46)
Lymphs Abs: 3.4 10*3/uL (ref 0.7–4.0)
MCH: 30.9 pg (ref 26.0–34.0)
MCHC: 34.3 g/dL (ref 30.0–36.0)
MCV: 90.1 fL (ref 78.0–100.0)
Monocytes Absolute: 0.7 10*3/uL (ref 0.1–1.0)
Monocytes Relative: 6 % (ref 3–12)
Neutro Abs: 8.5 10*3/uL — ABNORMAL HIGH (ref 1.7–7.7)
Neutrophils Relative %: 67 % (ref 43–77)
PLATELETS: 321 10*3/uL (ref 150–400)
RBC: 4.43 MIL/uL (ref 3.87–5.11)
RDW: 13.8 % (ref 11.5–15.5)
WBC: 12.8 10*3/uL — ABNORMAL HIGH (ref 4.0–10.5)

## 2014-12-29 LAB — TSH: TSH: 0.576 u[IU]/mL (ref 0.350–4.500)

## 2014-12-29 LAB — TROPONIN I: Troponin I: <0.03 ng/mL (ref <0.031)

## 2014-12-29 LAB — PROTIME-INR
INR: 2.19 — AB (ref 0.00–1.49)
PROTHROMBIN TIME: 24.5 s — AB (ref 11.6–15.2)

## 2014-12-29 MED ORDER — LOSARTAN POTASSIUM 50 MG PO TABS
100.0000 mg | ORAL_TABLET | Freq: Every evening | ORAL | Status: DC
Start: 2014-12-29 — End: 2014-12-29

## 2014-12-29 MED ORDER — ATENOLOL 25 MG PO TABS
50.0000 mg | ORAL_TABLET | Freq: Every evening | ORAL | Status: DC
  Administered 2014-12-29 – 2014-12-30 (×2): 50 mg via ORAL
  Filled 2014-12-29 (×2): qty 2

## 2014-12-29 MED ORDER — WARFARIN SODIUM 2 MG PO TABS
2.0000 mg | ORAL_TABLET | Freq: Once | ORAL | Status: AC
  Administered 2014-12-29: 2 mg via ORAL
  Filled 2014-12-29: qty 1

## 2014-12-29 MED ORDER — CITALOPRAM HYDROBROMIDE 20 MG PO TABS
20.0000 mg | ORAL_TABLET | Freq: Every day | ORAL | Status: DC
Start: 2014-12-30 — End: 2014-12-31
  Administered 2014-12-30 – 2014-12-31 (×2): 20 mg via ORAL
  Filled 2014-12-29 (×2): qty 1

## 2014-12-29 MED ORDER — DILTIAZEM HCL ER COATED BEADS 240 MG PO CP24
240.0000 mg | ORAL_CAPSULE | Freq: Every day | ORAL | Status: DC
Start: 2014-12-30 — End: 2014-12-31
  Administered 2014-12-30 – 2014-12-31 (×2): 240 mg via ORAL
  Filled 2014-12-29 (×2): qty 1

## 2014-12-29 MED ORDER — ONDANSETRON HCL 4 MG/2ML IJ SOLN: 4.0000 mg | Freq: Four times a day (QID) | INTRAMUSCULAR | Status: DC | PRN

## 2014-12-29 MED ORDER — ONDANSETRON HCL 4 MG PO TABS: 4.0000 mg | ORAL_TABLET | Freq: Four times a day (QID) | ORAL | Status: DC | PRN

## 2014-12-29 MED ORDER — CALCIUM CARBONATE-VITAMIN D 500-200 MG-UNIT PO TABS
1.0000 | ORAL_TABLET | Freq: Every day | ORAL | Status: DC
  Administered 2014-12-29 – 2014-12-31 (×3): 1 via ORAL
  Filled 2014-12-29 (×3): qty 1

## 2014-12-29 MED ORDER — BIOTIN 1000 MCG PO TABS: 1000.0000 ug | ORAL_TABLET | Freq: Every day | ORAL | Status: DC

## 2014-12-29 MED ORDER — FUROSEMIDE 10 MG/ML IJ SOLN
20.0000 mg | Freq: Two times a day (BID) | INTRAMUSCULAR | Status: DC
  Administered 2014-12-30 – 2014-12-31 (×3): 20 mg via INTRAVENOUS
  Filled 2014-12-29 (×3): qty 2

## 2014-12-29 MED ORDER — WARFARIN - PHARMACIST DOSING INPATIENT
Status: DC
  Administered 2014-12-30: 16:00:00

## 2014-12-29 MED ORDER — NITROGLYCERIN 2 % TD OINT
0.5000 [in_us] | TOPICAL_OINTMENT | Freq: Once | TRANSDERMAL | Status: AC
  Administered 2014-12-29: 0.5 [in_us] via TOPICAL
  Filled 2014-12-29: qty 1

## 2014-12-29 MED ORDER — SODIUM CHLORIDE 0.9 % IJ SOLN
3.0000 mL | Freq: Two times a day (BID) | INTRAMUSCULAR | Status: DC
  Administered 2014-12-29 – 2014-12-31 (×4): 3 mL via INTRAVENOUS

## 2014-12-29 MED ORDER — PREDNISONE 10 MG PO TABS
5.0000 mg | ORAL_TABLET | Freq: Every day | ORAL | Status: DC
  Administered 2014-12-30 – 2014-12-31 (×2): 5 mg via ORAL
  Filled 2014-12-29 (×2): qty 1

## 2014-12-29 MED ORDER — ATORVASTATIN CALCIUM 40 MG PO TABS
40.0000 mg | ORAL_TABLET | Freq: Every day | ORAL | Status: DC
  Administered 2014-12-29 – 2014-12-30 (×2): 40 mg via ORAL
  Filled 2014-12-29 (×2): qty 1

## 2014-12-29 MED ORDER — PREDNISONE 10 MG PO TABS: 5.0000 mg | ORAL_TABLET | Freq: Every day | ORAL | Status: DC

## 2014-12-29 MED ORDER — WARFARIN SODIUM 2 MG PO TABS: 2.0000 mg | ORAL_TABLET | Freq: Every day | ORAL | Status: DC

## 2014-12-29 MED ORDER — FUROSEMIDE 10 MG/ML IJ SOLN
60.0000 mg | Freq: Once | INTRAMUSCULAR | Status: AC
  Administered 2014-12-29: 60 mg via INTRAVENOUS
  Filled 2014-12-29: qty 6

## 2014-12-29 MED ORDER — FUROSEMIDE 10 MG/ML IJ SOLN: 20.0000 mg | Freq: Two times a day (BID) | INTRAMUSCULAR | Status: DC

## 2014-12-29 NOTE — Consult Note (Addendum)
Primary cardiologist: Dr Johnny Bridge Consulting cardiologist: Dr Carlyle Dolly   Clinical Summary [name] [name] is a 81 y.o.female hx of HTN, PAF on coumadin, non-obstructive CAD by cath 2004, hx of CVA, and polymyalgia rheumatic admitted with SOB. Progression x 1 weeks, also reports some LE edema and orthopnea. Atypical sharp pain under left breast at times that can radiate to left shoulder and back, lasts just a few seconds.   ER vitals  p 71 bp 178/96 Wt 170 lbs 100% RA 12/2013 [facility] echo: LVEF 60-65%, mild MR, mild aortic stenosis (parameters unclear, reported AV mean grad of 9 mmHg but AVA of 1 cm squared) INR 2.19, Trop neg x1, Cr 0.93, K 3.6, Hgb 13.7, Plt 321, BNP 396 CXR mild edema EKG afib, no ischemic changes Lexiscan Cardiolite from March 2013 showed no diagnostic ST segment abnormalities, anterior attenuation without scar or ischemia, LVEF 72%.    Allergies  Allergen Reactions  . Sulfonamide Derivatives Rash    Medications Scheduled Medications: . furosemide  60 mg Intravenous Once     Infusions:     PRN Medications:     Past Medical History  Diagnosis Date  . Polymyalgia rheumatica   . Stroke   . Essential hypertension, benign   . Hyperlipidemia   . Paroxysmal atrial fibrillation     On Coumadin  . Coronary atherosclerosis of native coronary artery     Nonobstructive at cardiac catheterization 2004  . Dysphagia     Past Surgical History  Procedure Laterality Date  . Total abdominal hysterectomy    . Appendectomy    . Left ankle    . Toe surgery    . Colonoscopy  2008    Dr.Fleshiman    Family History  Problem Relation Age of Onset  . Coronary artery disease Other   . Stroke Mother   . Hypertension Sister   . Hypertension Brother   . Rheum arthritis Daughter   . Healthy Son   . Healthy Son   . Healthy Son   . Hypertension Sister   . Heart disease Sister   . Heart disease Brother   . Hypertension Brother   . Arthritis Brother    . Colon cancer Brother   . Hypertension Brother     Social History Ms. Vanecek reports that she has never smoked. She has never used smokeless tobacco. Ms. Zufall reports that she does not drink alcohol.  Review of Systems CONSTITUTIONAL: No weight loss, fever, chills, weakness or fatigue.  HEENT: Eyes: No visual loss, blurred vision, double vision or yellow sclerae. No hearing loss, sneezing, congestion, runny nose or sore throat.  SKIN: No rash or itching.  CARDIOVASCULAR: per HPI RESPIRATORY: No shortness of breath, cough or sputum.  GASTROINTESTINAL: No anorexia, nausea, vomiting or diarrhea. No abdominal pain or blood.  GENITOURINARY: no polyuria, no dysuria NEUROLOGICAL: No headache, dizziness, syncope, paralysis, ataxia, numbness or tingling in the extremities. No change in bowel or bladder control.  MUSCULOSKELETAL: No muscle, back pain, joint pain or stiffness.  HEMATOLOGIC: No anemia, bleeding or bruising.  LYMPHATICS: No enlarged nodes. No history of splenectomy.  PSYCHIATRIC: No history of depression or anxiety.      Physical Examination Blood pressure 156/87, pulse 75, temperature 98.7 F (37.1 C), temperature source Oral, resp. rate 18, height 5\' 7"  (1.702 m), weight 170 lb (77.111 kg), SpO2 100 %. No intake or output data in the 24 hours ending 12/29/14 1552  HEENT: sclera clear  Cardiovascular: irreg, 2/6 systolic  murmur RUSB, no JVD  Respiratory: crackles bilateral bases  GI: abdomen soft, NT, ND  MSK: no LE edema  Neuro: no focal deficits  Psych: appropriate affect   Lab Results  Basic Metabolic Panel:  Recent Labs Lab 12/29/14 1430  NA 138  K 3.6  CL 104  CO2 22  GLUCOSE 133*  BUN 15  CREATININE 0.93  CALCIUM 9.6    Liver Function Tests:  Recent Labs Lab 12/29/14 1430  AST 24  ALT 19  ALKPHOS 77  BILITOT 0.9  PROT 7.8  ALBUMIN 4.4    CBC:  Recent Labs Lab 12/29/14 1430  WBC 12.8*  NEUTROABS 8.5*  HGB 13.7  HCT  39.9  MCV 90.1  PLT 321    Cardiac Enzymes:  Recent Labs Lab 12/29/14 1430  TROPONINI <0.03    BNP: Invalid input(s): POCBNP     Impression/Recommendations  1. Acute CHF - probably diastolic, will repeat echo during this admission - plan for IV diuresis tonight. Follow bp's, fairly significant response to NG paste - does not appear on diuretic at home, appears to be lasix naive. Received 60mg  IV in ER, would follow response prior to redosing.   2. Afib - normal rates, she has been on atenolol and dilt for rate control - continue rate control meds, continue coumadin  3. Aortic stenosis - mild by echo last year though the reported parameters are inconsistent (mean grad 9, AVA 1) - will f/u repeat echo results  4. HTN - soft bp's since NG paste applied, continue to follow. Would hold losartan for now, try to continue atenolol and dilt if possible.  5. Atypical chest pain - atypical symptoms, no evidence of ACS at this time. Cycle EKG and enzymes over night.   Carlyle Dolly, M.D.

## 2014-12-29 NOTE — Telephone Encounter (Signed)
Spoke with patient and she c/o feeling extremely weak, her left arm didn't want to move, hurting in her back near ribs on the left side, chills, sweating and sob. Patient denies dizziness. Patient advised to go to the ED now. Patient request if she could go to [facility] and nurse informed her that she could if she was stable enough.

## 2014-12-29 NOTE — H&P (Signed)
[facility] History and Physical  [name] [name]:[id number] DOB: [birth year] DOA: 12/29/2014  Referring physician: ER, Dr. Vanita Panda PCP: Gar Ponto, MD   Chief Complaint: Dyspnea  HPI: [name] [name] is a 81 y.o. female  This is an 81 year old lady who has a previous history of mild aortic stenosis, paroxysmal atrial fibrillation and nonobstructive coronary artery disease by cardiac catheterization in 2004, now presents with one-week history of progressive dyspnea on minimal exertion associated with some lower leg edema. She denies any PND or orthopnea. She also has had it tightness in her chest intermittently at times brought on by exertion. Thankfully, this does not last very long. She is on chronic Coumadin therapy for her atrial fibrillation. She also has polymyalgia rheumatica and is on steroids. She is now being admitted for management of her heart failure.   Review of Systems:  Apart from symptoms above, all systems negative.   Past Medical History  Diagnosis Date  . Polymyalgia rheumatica   . Stroke   . Essential hypertension, benign   . Hyperlipidemia   . Paroxysmal atrial fibrillation     On Coumadin  . Coronary atherosclerosis of native coronary artery     Nonobstructive at cardiac catheterization 2004  . Dysphagia    Past Surgical History  Procedure Laterality Date  . Total abdominal hysterectomy    . Appendectomy    . Left ankle    . Toe surgery    . Colonoscopy  2008    Dr.Fleshiman   Social History:  reports that she has never smoked. She has never used smokeless tobacco. She reports that she does not drink alcohol or use illicit drugs.  Allergies  Allergen Reactions  . Sulfonamide Derivatives Rash    Family History  Problem Relation Age of Onset  . Coronary artery disease Other   . Stroke Mother   . Hypertension Sister   . Hypertension Brother   . Rheum arthritis Daughter   . Healthy Son   . Healthy Son   . Healthy Son   . Hypertension  Sister   . Heart disease Sister   . Heart disease Brother   . Hypertension Brother   . Arthritis Brother   . Colon cancer Brother   . Hypertension Brother     Prior to Admission medications   Medication Sig Start Date End Date Taking? Authorizing Provider  atenolol (TENORMIN) 50 MG tablet Take 50 mg by mouth every evening.    Yes Historical Provider, MD  atorvastatin (LIPITOR) 40 MG tablet Take 40 mg by mouth at bedtime.    Yes Historical Provider, MD  Biotin 1000 MCG tablet Take 1,000 mcg by mouth daily.   Yes Historical Provider, MD  Calcium Carbonate-Vitamin D (CALTRATE 600+D) 600-400 MG-UNIT per tablet Take 1 tablet by mouth daily.     Yes Historical Provider, MD  citalopram (CELEXA) 20 MG tablet Take 20 mg by mouth daily.   Yes Historical Provider, MD  diltiazem (CARDIZEM CD) 240 MG 24 hr capsule Take 240 mg by mouth daily.   Yes Historical Provider, MD  furosemide (LASIX) 20 MG tablet Take 20 mg by mouth daily.   Yes Historical Provider, MD  losartan (COZAAR) 100 MG tablet Take 100 mg by mouth every evening.    Yes Historical Provider, MD  predniSONE (DELTASONE) 5 MG tablet Take 5 mg by mouth daily.   Yes Historical Provider, MD  warfarin (COUMADIN) 4 MG tablet Take 2-4 mg by mouth daily. Alternate taking 4mg  every evening  with 2mg  every evening   Yes Historical Provider, MD   Physical Exam: Filed Vitals:   12/29/14 1601 12/29/14 1616 12/29/14 1630 12/29/14 1645  BP: 106/88  133/94   Pulse: 74 76  67  Temp:      TempSrc:      Resp: 18 18 14 14   Height:      Weight:      SpO2: 100% 100%  100%    Wt Readings from Last 3 Encounters:  12/29/14 77.111 kg (170 lb)  10/06/14 79.833 kg (176 lb)  09/22/14 79.742 kg (175 lb 12.8 oz)    General:  Appears calm and comfortable. She appears to have improved somewhat with intravenous Lasix and Nitropaste given in the emergency room. She does not require supplemental oxygen and does not appear to have increased work of breathing.    Eyes: PERRL, normal lids, irises & conjunctiva ENT: grossly normal hearing, lips & tongue Neck: no LAD, masses or thyromegaly Cardiovascular: Irregularly irregular heart rate with soft systolic murmur, consistent with aortic stenosis. There is not much in the way of peripheral pitting edema in her lower legs. Telemetry: Atrial fibrillation. Ventricular rate is controlled. Respiratory: Lung fields at the present time actually are clear without any crackles, wheezing or bronchial breathing.  Abdomen: soft, ntnd Skin: no rash or induration seen on limited exam Musculoskeletal: grossly normal tone BUE/BLE Psychiatric: grossly normal mood and affect, speech fluent and appropriate Neurologic: grossly non-focal.          Labs on Admission:  Basic Metabolic Panel:  Recent Labs Lab 12/29/14 1430  NA 138  K 3.6  CL 104  CO2 22  GLUCOSE 133*  BUN 15  CREATININE 0.93  CALCIUM 9.6   Liver Function Tests:  Recent Labs Lab 12/29/14 1430  AST 24  ALT 19  ALKPHOS 77  BILITOT 0.9  PROT 7.8  ALBUMIN 4.4   No results for input(s): LIPASE, AMYLASE in the last 168 hours. No results for input(s): AMMONIA in the last 168 hours. CBC:  Recent Labs Lab 12/29/14 1430  WBC 12.8*  NEUTROABS 8.5*  HGB 13.7  HCT 39.9  MCV 90.1  PLT 321   Cardiac Enzymes:  Recent Labs Lab 12/29/14 1430  TROPONINI <0.03    BNP (last 3 results)  Recent Labs  12/29/14 1452  BNP 396.0*    ProBNP (last 3 results) No results for input(s): PROBNP in the last 8760 hours.  CBG: No results for input(s): GLUCAP in the last 168 hours.  Radiological Exams on Admission: Dg Chest Portable 1 View  12/29/2014   CLINICAL DATA:  81 year old female with pain and back near ribs. Chills, sweating and shortness breath. Initial encounter.  EXAM: PORTABLE CHEST - 1 VIEW  COMPARISON:  12/23/2011.  FINDINGS: Cardiomegaly.  Central pulmonary vascular prominence.  No segmental consolidation or gross pneumothorax.   Mildly tortuous aorta.  No obvious osseous lesion.  The patient would eventually benefit from two view chest.  IMPRESSION: Cardiomegaly.  Mild central pulmonary vascular prominence.  No segmental consolidation.   Electronically Signed   By: Genia Del M.D.   On: 12/29/2014 14:57    EKG: Independently reviewed. atrial fibrillation. Ventricular rate is controlled. No acute ST-T wave changes.  Assessment/Plan   1. Acute diastolic congestive heart failure. She is orally responding to treatment given in the emergency room with intravenous Lasix and Nitropaste. I think that it would be prudent to still have her on low-dose intravenous Lasix overnight. Monitor strict input/output  and daily weights. Order echocardiogram. [place] cardiology consultation. 2. Chronic atrial fibrillation. Rate is controlled. Continue with warfarin. 3. Hypertension. Her blood pressure was somewhat soft with Nitropaste and Lasix. We will hold losartan for the time being. 4. Coronary artery disease. This has been documented to be nonobstructive in the past. 5. Polymyalgia rheumatica. Continue with low-dose prednisone 5 mg daily.  Further recommendations will depend on patient's hospital progress.   Code Status: full code.  DVT Prophylaxis: warfarin.   Family Communication: I discussed the plan with the patient at the bedside  Disposition Plan: home when medically stable.   Time spent: 60 minutes.  Doree Albee [facility] Pager [phone].

## 2014-12-29 NOTE — ED Provider Notes (Signed)
CSN: [id number]     Arrival date & time 12/29/14  1420 History   First MD Initiated Contact with Patient 12/29/14 1448     Chief Complaint  Patient presents with  . Shortness of Breath     (Consider location/radiation/quality/duration/timing/severity/associated sxs/prior Treatment) HPI Patient presents with concern of dyspnea. Symptoms began yesterday, with associated left shoulder pain, tired sensation. Patient denies loss of sensation or strength in the arm. She also denies facial asymmetry, speech difficulty. Symptoms began without clear precipitant, and since onset has been persistent. No clear alleviating or exacerbating factors, no pleuritic pain. Patient acknowledges multiple medical issues, including atrial fibrillation, congestive heart failure, aortic stenosis.  Past Medical History  Diagnosis Date  . Polymyalgia rheumatica   . Stroke   . Essential hypertension, benign   . Hyperlipidemia   . Paroxysmal atrial fibrillation     On Coumadin  . Coronary atherosclerosis of native coronary artery     Nonobstructive at cardiac catheterization 2004  . Dysphagia    Past Surgical History  Procedure Laterality Date  . Total abdominal hysterectomy    . Appendectomy    . Left ankle    . Toe surgery    . Colonoscopy  2008    Dr.Fleshiman   Family History  Problem Relation Age of Onset  . Coronary artery disease Other   . Stroke Mother   . Hypertension Sister   . Hypertension Brother   . Rheum arthritis Daughter   . Healthy Son   . Healthy Son   . Healthy Son   . Hypertension Sister   . Heart disease Sister   . Heart disease Brother   . Hypertension Brother   . Arthritis Brother   . Colon cancer Brother   . Hypertension Brother    History  Substance Use Topics  . Smoking status: Never Smoker   . Smokeless tobacco: Never Used  . Alcohol Use: No   OB History    No data available     Review of Systems  Constitutional:       Per HPI, otherwise negative   HENT:       Per HPI, otherwise negative  Respiratory:       Per HPI, otherwise negative  Cardiovascular:       Per HPI, otherwise negative  Gastrointestinal: Negative for vomiting.  Endocrine:       Negative aside from HPI  Genitourinary:       Neg aside from HPI   Musculoskeletal:       Per HPI, otherwise negative  Skin: Negative.   Neurological: Negative for syncope.      Allergies  Sulfonamide derivatives  Home Medications   Prior to Admission medications   Medication Sig Start Date End Date Taking? Authorizing Provider  atenolol (TENORMIN) 50 MG tablet Take 50 mg by mouth daily.      Historical Provider, MD  atorvastatin (LIPITOR) 40 MG tablet Take 40 mg by mouth daily.    Historical Provider, MD  Biotin 1000 MCG tablet Take 1,000 mcg by mouth daily.    Historical Provider, MD  Calcium Carbonate-Vitamin D (CALTRATE 600+D) 600-400 MG-UNIT per tablet Take 1 tablet by mouth daily.      Historical Provider, MD  citalopram (CELEXA) 20 MG tablet Take 20 mg by mouth daily.    Historical Provider, MD  diltiazem (CARDIZEM CD) 240 MG 24 hr capsule Take 240 mg by mouth daily.    Historical Provider, MD  furosemide (LASIX) 20 MG tablet  Take 20 mg by mouth daily.    Historical Provider, MD  losartan (COZAAR) 100 MG tablet Take 100 mg by mouth daily.    Historical Provider, MD  predniSONE (DELTASONE) 5 MG tablet Take 5 mg by mouth daily.    Historical Provider, MD  warfarin (COUMADIN) 4 MG tablet Take 2-4 mg by mouth daily. 4mg  one and then 2mg  the next day and this alternates. MANAGED BY PMD    Historical Provider, MD   BP 156/87 mmHg  Pulse 75  Temp(Src) 98.7 F (37.1 C) (Oral)  Resp 18  Ht 5\' 7"  (1.702 m)  Wt 170 lb (77.111 kg)  BMI 26.62 kg/m2  SpO2 100% Physical Exam  Constitutional: She is oriented to person, place, and time. She appears well-developed and well-nourished. She appears distressed.  HENT:  Head: Normocephalic and atraumatic.  Eyes: Conjunctivae and EOM  are normal.  Cardiovascular: Normal rate.  An irregularly irregular rhythm present.  Pulmonary/Chest: Accessory muscle usage present. No stridor. Tachypnea noted. She is in respiratory distress. She has decreased breath sounds.  Abdominal: She exhibits no distension.  Musculoskeletal: She exhibits no edema.       Left shoulder: Normal.       Left elbow: Normal.       Left wrist: Normal.  Neurological: She is alert and oriented to person, place, and time. She displays no atrophy and no tremor. No cranial nerve deficit or sensory deficit. She exhibits normal muscle tone. She displays no seizure activity. Coordination normal.  Skin: Skin is warm. She is diaphoretic.  Psychiatric: She has a normal mood and affect.  Nursing note and vitals reviewed.   ED Course  Procedures (including critical care time) Labs Review Labs Reviewed  CBC WITH DIFFERENTIAL/PLATELET - Abnormal; Notable for the following:    WBC 12.8 (*)    Neutro Abs 8.5 (*)    All other components within normal limits  COMPREHENSIVE METABOLIC PANEL - Abnormal; Notable for the following:    Glucose, Bld 133 (*)    GFR calc non Af Amer 56 (*)    All other components within normal limits  PROTIME-INR - Abnormal; Notable for the following:    Prothrombin Time 24.5 (*)    INR 2.19 (*)    All other components within normal limits  BRAIN NATRIURETIC PEPTIDE - Abnormal; Notable for the following:    B Natriuretic Peptide 396.0 (*)    All other components within normal limits  TROPONIN I    Imaging Review Dg Chest Portable 1 View  12/29/2014   CLINICAL DATA:  81 year old female with pain and back near ribs. Chills, sweating and shortness breath. Initial encounter.  EXAM: PORTABLE CHEST - 1 VIEW  COMPARISON:  12/23/2011.  FINDINGS: Cardiomegaly.  Central pulmonary vascular prominence.  No segmental consolidation or gross pneumothorax.  Mildly tortuous aorta.  No obvious osseous lesion.  The patient would eventually benefit from two  view chest.  IMPRESSION: Cardiomegaly.  Mild central pulmonary vascular prominence.  No segmental consolidation.   Electronically Signed   By: Genia Del M.D.   On: 12/29/2014 14:57     EKG Interpretation   Date/Time:  Thursday Dec 29 2014 14:28:54 EDT Ventricular Rate:  74 PR Interval:    QRS Duration: 111 QT Interval:  411 QTC Calculation: 456 R Axis:   74 Text Interpretation:  Atrial fibrillation Ventricular premature complex  Anterior infarct, old Minimal ST depression, diffuse leads Atrial  fibrillation Artifact Abnormal ekg Confirmed by Carmin Muskrat  MD  (  [id number]) on 12/29/2014 2:55:54 PM      After the initial evaluation, with concern for respiratory distress, tachypnea, her history of heart failure, patient had cutaneous nitroglycerin patch applied, Lasix provided.    4:04 PM Patient appears in similar condition, slightly diminished dyspnea.  I discussed the patient's case with her cardiology team. Chart review demonstrates no no recent echocardiogram, though prior echocardiogram with concern for aortic stenosis.   MDM   Final diagnoses:  Dyspnea   patient presents with ongoing dyspnea. Here the patient is awake, alert, afebrile.  Evaluation suggests pulmonary congestion, possibly secondary to congestive heart failure exacerbation versus aortic stenosis. No evidence for concurrent infection. No evidence for ongoing coronary ischemia.  From taking Coumadin appropriately. After resuscitation with fluids, nitroglycerin, Lasix, patient was admitted for further evaluation, management.  Carmin Muskrat, MD 12/29/14 [phone]

## 2014-12-29 NOTE — Progress Notes (Signed)
[name] [name] for Warfarin Indication: atrial fibrillation  Allergies  Allergen Reactions  . Sulfonamide Derivatives Rash    Patient Measurements: Height: 5\' 7"  (170.2 cm) Weight: 165 lb (74.844 kg) IBW/kg (Calculated) : 61.6  Vital Signs: Temp: 97.5 F (36.4 C) (05/12 1816) Temp Source: Oral (05/12 1816) BP: 140/84 mmHg (05/12 1816) Pulse Rate: 68 (05/12 1816)  Labs:  Recent Labs  12/29/14 1430  HGB 13.7  HCT 39.9  PLT 321  LABPROT 24.5*  INR 2.19*  CREATININE 0.93  TROPONINI <0.03    Estimated Creatinine Clearance: 49.3 mL/min (by C-G formula based on Cr of 0.93).   Medical History: Past Medical History  Diagnosis Date  . Polymyalgia rheumatica   . Stroke   . Essential hypertension, benign   . Hyperlipidemia   . Paroxysmal atrial fibrillation     On Coumadin  . Coronary atherosclerosis of native coronary artery     Nonobstructive at cardiac catheterization 2004  . Dysphagia     Medications:  Prescriptions prior to admission  Medication Sig Dispense Refill Last Dose  . atenolol (TENORMIN) 50 MG tablet Take 50 mg by mouth every evening.    12/28/2014 at 2100  . atorvastatin (LIPITOR) 40 MG tablet Take 40 mg by mouth at bedtime.    12/28/2014 at Unknown time  . Biotin 1000 MCG tablet Take 1,000 mcg by mouth daily.   12/28/2014 at Unknown time  . Calcium Carbonate-Vitamin D (CALTRATE 600+D) 600-400 MG-UNIT per tablet Take 1 tablet by mouth daily.     12/28/2014 at Unknown time  . citalopram (CELEXA) 20 MG tablet Take 20 mg by mouth daily.   12/29/2014 at Unknown time  . diltiazem (CARDIZEM CD) 240 MG 24 hr capsule Take 240 mg by mouth daily.   12/29/2014 at Unknown time  . furosemide (LASIX) 20 MG tablet Take 20 mg by mouth daily.   12/29/2014 at Unknown time  . losartan (COZAAR) 100 MG tablet Take 100 mg by mouth every evening.    12/28/2014 at Unknown time  . predniSONE (DELTASONE) 5 MG tablet Take 5 mg by mouth daily.    12/29/2014 at Unknown time  . warfarin (COUMADIN) 4 MG tablet Take 2-4 mg by mouth daily. Alternate taking 4mg  every evening with 2mg  every evening   12/28/2014 at 2130    Assessment: Okay for Protocol, INR @ goal.  Received 4 mg dose on 5/11.  Goal of Therapy:  INR 2-3   Plan:  Warfarin 2 mg PO x 1 today. Daily PT/INR. Monitor for S/S bleeding.  [name] [name] 12/29/2014,6:59 PM

## 2014-12-29 NOTE — ED Notes (Signed)
MD at bedside. 

## 2014-12-29 NOTE — ED Notes (Signed)
Pt states SOB began yesterday. Pt also stating left shoulder pain began today and states left shoulder was "tired yesterday, didn't want to work right" Pt also states chills.

## 2014-12-29 NOTE — ED Notes (Signed)
Cardiologist Dr. Harl Bowie at bedside at this time.

## 2014-12-30 ENCOUNTER — Inpatient Hospital Stay (HOSPITAL_COMMUNITY): Payer: Medicare Other

## 2014-12-30 DIAGNOSIS — I25118 Atherosclerotic heart disease of native coronary artery with other forms of angina pectoris: Secondary | ICD-10-CM

## 2014-12-30 DIAGNOSIS — I4891 Unspecified atrial fibrillation: Secondary | ICD-10-CM

## 2014-12-30 LAB — COMPREHENSIVE METABOLIC PANEL
ALK PHOS: 64 U/L (ref 38–126)
ALT: 18 U/L (ref 14–54)
ANION GAP: 10 (ref 5–15)
AST: 18 U/L (ref 15–41)
Albumin: 3.8 g/dL (ref 3.5–5.0)
BUN: 17 mg/dL (ref 6–20)
CO2: 26 mmol/L (ref 22–32)
CREATININE: 0.99 mg/dL (ref 0.44–1.00)
Calcium: 9.2 mg/dL (ref 8.9–10.3)
Chloride: 103 mmol/L (ref 101–111)
GFR calc Af Amer: 60 mL/min — ABNORMAL LOW (ref >60)
GFR calc non Af Amer: 52 mL/min — ABNORMAL LOW (ref >60)
Glucose, Bld: 112 mg/dL — ABNORMAL HIGH (ref 65–99)
Potassium: 3.9 mmol/L (ref 3.5–5.1)
Sodium: 139 mmol/L (ref 135–145)
TOTAL PROTEIN: 6.7 g/dL (ref 6.5–8.1)
Total Bilirubin: 0.8 mg/dL (ref 0.3–1.2)

## 2014-12-30 LAB — BRAIN NATRIURETIC PEPTIDE: B Natriuretic Peptide: 228 pg/mL — ABNORMAL HIGH (ref 0.0–100.0)

## 2014-12-30 LAB — CBC
HEMATOCRIT: 36.9 % (ref 36.0–46.0)
Hemoglobin: 12.4 g/dL (ref 12.0–15.0)
MCH: 30.5 pg (ref 26.0–34.0)
MCHC: 33.6 g/dL (ref 30.0–36.0)
MCV: 90.7 fL (ref 78.0–100.0)
PLATELETS: 309 10*3/uL (ref 150–400)
RBC: 4.07 MIL/uL (ref 3.87–5.11)
RDW: 13.9 % (ref 11.5–15.5)
WBC: 9.3 10*3/uL (ref 4.0–10.5)

## 2014-12-30 LAB — TROPONIN I
Troponin I: <0.03 ng/mL (ref <0.031)
Troponin I: <0.03 ng/mL (ref <0.031)

## 2014-12-30 LAB — PROTIME-INR
INR: 1.99 — AB (ref 0.00–1.49)
PROTHROMBIN TIME: 22.8 s — AB (ref 11.6–15.2)

## 2014-12-30 MED ORDER — WARFARIN SODIUM 2 MG PO TABS
4.0000 mg | ORAL_TABLET | Freq: Once | ORAL | Status: AC
  Administered 2014-12-30: 4 mg via ORAL
  Filled 2014-12-30: qty 2

## 2014-12-30 NOTE — Care Management Note (Signed)
Case Management Note  Patient Details  Name: [name] [name] MRN: [id number] Date of Birth: [birth year]  Expected Discharge Date:  01/02/15               Expected Discharge Plan:  Home/Self Care  In-House Referral:  NA  Discharge planning Services  CM Consult  Post Acute Care Choice:    Choice offered to:     DME Arranged:    DME Agency:     HH Arranged:    [place] Agency:     Status of Service:  Completed, signed off  Medicare Important Message Given:  Yes Date Medicare IM Given:  12/30/14 Medicare IM give by:  Jolene Provost, RN, MSN, CM  Date Additional Medicare IM Given:    Additional Medicare Important Message give by:     If discussed at [place] of Stay Meetings, dates discussed:    Additional Comments: Pt is from home, lives with husband and is independent at baseline. Pt has cane but no other DME's or [facility] services. Pt plans to DC home with self care. No CM needs.   Sherald Barge, RN 12/30/2014, 1:59 PM

## 2014-12-30 NOTE — Progress Notes (Signed)
[facility] for Warfarin Indication: atrial fibrillation  Allergies  Allergen Reactions  . Sulfonamide Derivatives Rash    Patient Measurements: Height: 5\' 7"  (170.2 cm) Weight: 168 lb 1.6 oz (76.25 kg) IBW/kg (Calculated) : 61.6  Vital Signs: Temp: 98.8 F (37.1 C) (05/13 0504) Temp Source: Oral (05/13 0504) BP: 167/63 mmHg (05/13 0504) Pulse Rate: 65 (05/13 0504)  Labs:  Recent Labs  12/29/14 1430 12/29/14 1753 12/29/14 2312 12/30/14 0544 12/30/14 0717  HGB 13.7  --   --  12.4  --   HCT 39.9  --   --  36.9  --   PLT 321  --   --  309  --   LABPROT 24.5*  --   --  22.8*  --   INR 2.19*  --   --  1.99*  --   CREATININE 0.93  --   --  0.99  --   TROPONINI <0.03 <0.03 <0.03  --  <0.03    Estimated Creatinine Clearance: 46.7 mL/min (by C-G formula based on Cr of 0.99).   Medical History: Past Medical History  Diagnosis Date  . Polymyalgia rheumatica   . Stroke   . Essential hypertension, benign   . Hyperlipidemia   . Paroxysmal atrial fibrillation     On Coumadin  . Coronary atherosclerosis of native coronary artery     Nonobstructive at cardiac catheterization 2004  . Dysphagia     Medications:  Prescriptions prior to admission  Medication Sig Dispense Refill Last Dose  . atenolol (TENORMIN) 50 MG tablet Take 50 mg by mouth every evening.    12/28/2014 at 2100  . atorvastatin (LIPITOR) 40 MG tablet Take 40 mg by mouth at bedtime.    12/28/2014 at Unknown time  . Biotin 1000 MCG tablet Take 1,000 mcg by mouth daily.   12/28/2014 at Unknown time  . Calcium Carbonate-Vitamin D (CALTRATE 600+D) 600-400 MG-UNIT per tablet Take 1 tablet by mouth daily.     12/28/2014 at Unknown time  . citalopram (CELEXA) 20 MG tablet Take 20 mg by mouth daily.   12/29/2014 at Unknown time  . diltiazem (CARDIZEM CD) 240 MG 24 hr capsule Take 240 mg by mouth daily.   12/29/2014 at Unknown time  . furosemide (LASIX) 20 MG tablet Take 20 mg by mouth  daily.   12/29/2014 at Unknown time  . losartan (COZAAR) 100 MG tablet Take 100 mg by mouth every evening.    12/28/2014 at Unknown time  . predniSONE (DELTASONE) 5 MG tablet Take 5 mg by mouth daily.   12/29/2014 at Unknown time  . warfarin (COUMADIN) 4 MG tablet Take 2-4 mg by mouth daily. Alternate taking 4mg  every evening with 2mg  every evening   12/28/2014 at 2130    Assessment: Okay for Protocol, INR slightly below goal.   Goal of Therapy:  INR 2-3   Plan:  Warfarin 4 mg PO x 1 today. Daily PT/INR. Monitor for S/S bleeding.  Biagio Quint R 12/30/2014,2:35 PM

## 2014-12-30 NOTE — Progress Notes (Signed)
[facility] PROGRESS NOTE  [name] [name] TZG:[id number] DOB: 1929/09/16    PCP:   Gar Ponto, MD   HPI: [name] [name] is an 81 y.o. female with hx of nonischemic CHF, afib on therapeutic Coumadin, HTN, aortic stenosis, admitted yesterday for CHF, having low dose diuresis at Lasix 20mg  IV BID, and is doing markedly better.  ECHO is pending, cardiology consulted.  Rewiew of Systems:  Constitutional: Negative for malaise, fever and chills. No significant weight loss or weight gain Eyes: Negative for eye pain, redness and discharge, diplopia, visual changes, or flashes of light. ENMT: Negative for ear pain, hoarseness, nasal congestion, sinus pressure and sore throat. No headaches; tinnitus, drooling, or problem swallowing. Cardiovascular: Negative for chest pain, palpitations, diaphoresis, dyspnea and peripheral edema. ; No orthopnea, PND Respiratory: Negative for cough, hemoptysis, wheezing and stridor. No pleuritic chestpain. Gastrointestinal: Negative for nausea, vomiting, diarrhea, constipation, abdominal pain, melena, blood in stool, hematemesis, jaundice and rectal bleeding.    Genitourinary: Negative for frequency, dysuria, incontinence,flank pain and hematuria; Musculoskeletal: Negative for back pain and neck pain. Negative for swelling and trauma.;  Skin: . Negative for pruritus, rash, abrasions, bruising and skin lesion.; ulcerations Neuro: Negative for headache, lightheadedness and neck stiffness. Negative for weakness, altered level of consciousness , altered mental status, extremity weakness, burning feet, involuntary movement, seizure and syncope.  Psych: negative for anxiety, depression, insomnia, tearfulness, panic attacks, hallucinations, paranoia, suicidal or homicidal ideation   Past Medical History  Diagnosis Date  . Polymyalgia rheumatica   . Stroke   . Essential hypertension, benign   . Hyperlipidemia   . Paroxysmal atrial fibrillation     On Coumadin  .  Coronary atherosclerosis of native coronary artery     Nonobstructive at cardiac catheterization 2004  . Dysphagia     Past Surgical History  Procedure Laterality Date  . Total abdominal hysterectomy    . Appendectomy    . Left ankle    . Toe surgery    . Colonoscopy  2008    Dr.Fleshiman    Medications:  HOME MEDS: Prior to Admission medications   Medication Sig Start Date End Date Taking? Authorizing Provider  atenolol (TENORMIN) 50 MG tablet Take 50 mg by mouth every evening.    Yes Historical Provider, MD  atorvastatin (LIPITOR) 40 MG tablet Take 40 mg by mouth at bedtime.    Yes Historical Provider, MD  Biotin 1000 MCG tablet Take 1,000 mcg by mouth daily.   Yes Historical Provider, MD  Calcium Carbonate-Vitamin D (CALTRATE 600+D) 600-400 MG-UNIT per tablet Take 1 tablet by mouth daily.     Yes Historical Provider, MD  citalopram (CELEXA) 20 MG tablet Take 20 mg by mouth daily.   Yes Historical Provider, MD  diltiazem (CARDIZEM CD) 240 MG 24 hr capsule Take 240 mg by mouth daily.   Yes Historical Provider, MD  furosemide (LASIX) 20 MG tablet Take 20 mg by mouth daily.   Yes Historical Provider, MD  losartan (COZAAR) 100 MG tablet Take 100 mg by mouth every evening.    Yes Historical Provider, MD  predniSONE (DELTASONE) 5 MG tablet Take 5 mg by mouth daily.   Yes Historical Provider, MD  warfarin (COUMADIN) 4 MG tablet Take 2-4 mg by mouth daily. Alternate taking 4mg  every evening with 2mg  every evening   Yes Historical Provider, MD     Allergies:  Allergies  Allergen Reactions  . Sulfonamide Derivatives Rash    Social History:   reports that she  has never smoked. She has never used smokeless tobacco. She reports that she does not drink alcohol or use illicit drugs.  Family History: Family History  Problem Relation Age of Onset  . Coronary artery disease Other   . Stroke Mother   . Hypertension Sister   . Hypertension Brother   . Rheum arthritis Daughter   .  Healthy Son   . Healthy Son   . Healthy Son   . Hypertension Sister   . Heart disease Sister   . Heart disease Brother   . Hypertension Brother   . Arthritis Brother   . Colon cancer Brother   . Hypertension Brother      Physical Exam: Filed Vitals:   12/29/14 1730 12/29/14 1816 12/29/14 2000 12/30/14 0504  BP: 148/74 140/84 126/61 167/63  Pulse: 67 68 70 65  Temp: 98.5 F (36.9 C) 97.5 F (36.4 C) 98.6 F (37 C) 98.8 F (37.1 C)  TempSrc:  Oral Oral Oral  Resp: 16  18 18   Height:  5\' 7"  (1.702 m)    Weight:  74.844 kg (165 lb)  76.25 kg (168 lb 1.6 oz)  SpO2: 100% 96% 97% 96%   Blood pressure 167/63, pulse 65, temperature 98.8 F (37.1 C), temperature source Oral, resp. rate 18, height 5\' 7"  (1.702 m), weight 76.25 kg (168 lb 1.6 oz), SpO2 96 %.  GEN:  Pleasant  patient lying in the stretcher in no acute distress; cooperative with exam. PSYCH:  alert and oriented x4; does not appear anxious or depressed; affect is appropriate. HEENT: Mucous membranes pink and anicteric; PERRLA; EOM intact; no cervical lymphadenopathy nor thyromegaly or carotid bruit; no JVD; There were no stridor. Neck is very supple. Breasts:: Not examined CHEST WALL: No tenderness CHEST: Normal respiration, clear to auscultation bilaterally.  HEART: Regular rate and rhythm.  There are no murmur, rub, or gallops.   BACK: No kyphosis or scoliosis; no CVA tenderness ABDOMEN: soft and non-tender; no masses, no organomegaly, normal abdominal bowel sounds; no pannus; no intertriginous candida. There is no rebound and no distention. Rectal Exam: Not done EXTREMITIES: No bone or joint deformity; age-appropriate arthropathy of the hands and knees; no edema; no ulcerations.  There is no calf tenderness. Genitalia: not examined PULSES: 2+ and symmetric SKIN: Normal hydration no rash or ulceration CNS: Cranial nerves 2-12 grossly intact no focal lateralizing neurologic deficit.  Speech is fluent; uvula elevated  with phonation, facial symmetry and tongue midline. DTR are normal bilaterally, cerebella exam is intact, barbinski is negative and strengths are equaled bilaterally.  No sensory loss.   Labs on Admission:  Basic Metabolic Panel:  Recent Labs Lab 12/29/14 1430 12/30/14 0544  NA 138 139  K 3.6 3.9  CL 104 103  CO2 22 26  GLUCOSE 133* 112*  BUN 15 17  CREATININE 0.93 0.99  CALCIUM 9.6 9.2   Liver Function Tests:  Recent Labs Lab 12/29/14 1430 12/30/14 0544  AST 24 18  ALT 19 18  ALKPHOS 77 64  BILITOT 0.9 0.8  PROT 7.8 6.7  ALBUMIN 4.4 3.8   No results for input(s): LIPASE, AMYLASE in the last 168 hours. No results for input(s): AMMONIA in the last 168 hours. CBC:  Recent Labs Lab 12/29/14 1430 12/30/14 0544  WBC 12.8* 9.3  NEUTROABS 8.5*  --   HGB 13.7 12.4  HCT 39.9 36.9  MCV 90.1 90.7  PLT 321 309   Cardiac Enzymes:  Recent Labs Lab 12/29/14 1430 12/29/14 1753 12/29/14  2312 12/30/14 0717  TROPONINI <0.03 <0.03 <0.03 <0.03    CBG: No results for input(s): GLUCAP in the last 168 hours.   Radiological Exams on Admission: Dg Chest Portable 1 View  12/29/2014   CLINICAL DATA:  81 year old female with pain and back near ribs. Chills, sweating and shortness breath. Initial encounter.  EXAM: PORTABLE CHEST - 1 VIEW  COMPARISON:  12/23/2011.  FINDINGS: Cardiomegaly.  Central pulmonary vascular prominence.  No segmental consolidation or gross pneumothorax.  Mildly tortuous aorta.  No obvious osseous lesion.  The patient would eventually benefit from two view chest.  IMPRESSION: Cardiomegaly.  Mild central pulmonary vascular prominence.  No segmental consolidation.   Electronically Signed   By: Genia Del M.D.   On: 12/29/2014 14:57   Assessment/Plan Present on Admission:  . Acute diastolic CHF (congestive heart failure) . Chronic atrial fibrillation . Coronary atherosclerosis of native coronary artery . Essential hypertension, benign . Acute diastolic  congestive heart failure  PLAN: acute on chronic CHF, doing better.  Will continue with low dose lasix.  Plan to d/c her tomorrow.  Careful not to over-diuse her due to aortic stenosis.   Afib:  Will continue with anticoagulation.  Rate controlled adequately.  HTN:  Controlled.   Other plans as per orders.  Code Status: FULL CODE>    Orvan Falconer, MD. [facility] Pager [phone] 7pm to 7am.  12/30/2014, 1:33 PM

## 2014-12-30 NOTE — Progress Notes (Signed)
Consulting cardiologist: Kate Sable  Primary Cardiologist:McDowell, Mikeal Hawthorne MD [facility])  Cardiology Specific Problem List:  1. Acute on Chronic Diastolic CHF 2. Atrial fibrillation-CHADS VASC - 5  on coumadin 3. Chest Pain  Subjective:    She is feeling and breathing better. Headache from NTG paste. Would like to go home. Echo pending.    Objective:   Temp:  [97.5 F (36.4 C)-98.8 F (37.1 C)] 98.8 F (37.1 C) (05/13 0504) Pulse Rate:  [65-78] 65 (05/13 0504) Resp:  [13-21] 18 (05/13 0504) BP: (106-178)/(61-96) 167/63 mmHg (05/13 0504) SpO2:  [96 %-100 %] 96 % (05/13 0504) Weight:  [165 lb (74.844 kg)-170 lb (77.111 kg)] 168 lb 1.6 oz (76.25 kg) (05/13 0504) Last BM Date: 12/29/14  Filed Weights   12/29/14 1431 12/29/14 1816 12/30/14 0504  Weight: 170 lb (77.111 kg) 165 lb (74.844 kg) 168 lb 1.6 oz (76.25 kg)   No intake or output data in the 24 hours ending 12/30/14 0804  Telemetry: Atrial fib with slow ventricular response 50's - 60's.   Exam:  General: No acute distress.  HEENT: Conjunctiva and lids normal, oropharynx clear.  Lungs: Mild bibasilar crackles, no wheezes No coughing   Cardiac: No elevated JVP or bruits. RRR, no gallop or rub.   Abdomen: Normoactive bowel sounds, nontender, nondistended.  Extremities: No pitting edema, distal pulses full.  Neuropsychiatric: Alert and oriented x3, affect appropriate.   Lab Results:  Basic Metabolic Panel:  Recent Labs Lab 12/29/14 1430 12/30/14 0544  NA 138 139  K 3.6 3.9  CL 104 103  CO2 22 26  GLUCOSE 133* 112*  BUN 15 17  CREATININE 0.93 0.99  CALCIUM 9.6 9.2    Liver Function Tests:  Recent Labs Lab 12/29/14 1430 12/30/14 0544  AST 24 18  ALT 19 18  ALKPHOS 77 64  BILITOT 0.9 0.8  PROT 7.8 6.7  ALBUMIN 4.4 3.8    CBC:  Recent Labs Lab 12/29/14 1430 12/30/14 0544  WBC 12.8* 9.3  HGB 13.7 12.4  HCT 39.9 36.9  MCV 90.1 90.7  PLT 321 309    Cardiac  Enzymes:  Recent Labs Lab 12/29/14 1753 12/29/14 2312 12/30/14 0717  TROPONINI <0.03 <0.03 <0.03    Coagulation:  Recent Labs Lab 12/29/14 1430 12/30/14 0544  INR 2.19* 1.99*    Radiology: Dg Chest Portable 1 View  12/29/2014   CLINICAL DATA:  81 year old female with pain and back near ribs. Chills, sweating and shortness breath. Initial encounter.  EXAM: PORTABLE CHEST - 1 VIEW  COMPARISON:  12/23/2011.  FINDINGS: Cardiomegaly.  Central pulmonary vascular prominence.  No segmental consolidation or gross pneumothorax.  Mildly tortuous aorta.  No obvious osseous lesion.  The patient would eventually benefit from two view chest.  IMPRESSION: Cardiomegaly.  Mild central pulmonary vascular prominence.  No segmental consolidation.   Electronically Signed   By: Genia Del M.D.   On: 12/29/2014 14:57      Medications:   Scheduled Medications: . atenolol  50 mg Oral QPM  . atorvastatin  40 mg Oral QHS  . calcium-vitamin D  1 tablet Oral Daily  . citalopram  20 mg Oral Daily  . diltiazem  240 mg Oral Daily  . furosemide  20 mg Intravenous Q12H  . predniSONE  5 mg Oral Daily  . sodium chloride  3 mL Intravenous Q12H  . Warfarin - Pharmacist Dosing Inpatient   Does not apply Q24H      PRN Medications: ondansetron **OR** ondansetron (  ZOFRAN) IV   Assessment and Plan:   1.CHF:  Has been placed on IV diuretic's with subjective increased urine output. No I/O completed. Per patient, she has lost 2 lbs. She admits to some dietary non-compliance but not excessively so. Creatine 0.99. GFR of 52. She was given one dose of IV lasix 60 mg yesterday now on IV lasix 20 mg BID. Will await echo results from today for medical management.   2. Atrial fibrillation: Slow ventricular response on diltiazem 240 mg   Rates in the 50's to 60's. CHADS VASC Score of 5. On coumadin. INR  1.99. Followed in [place] office. No evidence of anemia.   3. Chest discomfort with known CAD: Troponin is negative X  3. Will d/c NTG paste as this is causing headache.  Continue statin and atenolol. If echo abnormal will make further recommendations for ischemic testing.     Phill Myron. Lawrence NP [place]  12/30/2014, 8:04 AM   The patient was seen and examined, and I agree with the assessment and plan as documented above, with modifications as noted below. Pt feeling "much better". Awaiting echocardiogram. No I/O results to assess adequate diuretic dosage. BP elevated earlier this morning. Troponins normal. Continue present medical therapy.

## 2014-12-31 LAB — PROTIME-INR
INR: 1.99 — ABNORMAL HIGH (ref 0.00–1.49)
Prothrombin Time: 22.8 seconds — ABNORMAL HIGH (ref 11.6–15.2)

## 2014-12-31 MED ORDER — FUROSEMIDE 20 MG PO TABS: 20.0000 mg | ORAL_TABLET | Freq: Two times a day (BID) | ORAL | Status: DC

## 2014-12-31 MED ORDER — WARFARIN SODIUM 2 MG PO TABS: 2.0000 mg | ORAL_TABLET | Freq: Once | ORAL | Status: DC

## 2014-12-31 NOTE — Progress Notes (Signed)
[place] for Warfarin Indication: atrial fibrillation  Allergies  Allergen Reactions  . Sulfonamide Derivatives Rash    Patient Measurements: Height: 5\' 7"  (170.2 cm) Weight: 166 lb 11.2 oz (75.615 kg) IBW/kg (Calculated) : 61.6  Vital Signs: Temp: 98.1 F (36.7 C) (05/14 0507) Temp Source: Oral (05/14 0507) BP: 132/69 mmHg (05/14 0507) Pulse Rate: 70 (05/14 0507)  Labs:  Recent Labs  12/29/14 1430 12/29/14 1753 12/29/14 2312 12/30/14 0544 12/30/14 0717 12/31/14 0553  HGB 13.7  --   --  12.4  --   --   HCT 39.9  --   --  36.9  --   --   PLT 321  --   --  309  --   --   LABPROT 24.5*  --   --  22.8*  --  22.8*  INR 2.19*  --   --  1.99*  --  1.99*  CREATININE 0.93  --   --  0.99  --   --   TROPONINI <0.03 <0.03 <0.03  --  <0.03  --     Estimated Creatinine Clearance: 46.5 mL/min (by C-G formula based on Cr of 0.99).   Medical History: Past Medical History  Diagnosis Date  . Polymyalgia rheumatica   . Stroke   . Essential hypertension, benign   . Hyperlipidemia   . Paroxysmal atrial fibrillation     On Coumadin  . Coronary atherosclerosis of native coronary artery     Nonobstructive at cardiac catheterization 2004  . Dysphagia     Medications:  Prescriptions prior to admission  Medication Sig Dispense Refill Last Dose  . atenolol (TENORMIN) 50 MG tablet Take 50 mg by mouth every evening.    12/28/2014 at 2100  . atorvastatin (LIPITOR) 40 MG tablet Take 40 mg by mouth at bedtime.    12/28/2014 at Unknown time  . Biotin 1000 MCG tablet Take 1,000 mcg by mouth daily.   12/28/2014 at Unknown time  . Calcium Carbonate-Vitamin D (CALTRATE 600+D) 600-400 MG-UNIT per tablet Take 1 tablet by mouth daily.     12/28/2014 at Unknown time  . citalopram (CELEXA) 20 MG tablet Take 20 mg by mouth daily.   12/29/2014 at Unknown time  . diltiazem (CARDIZEM CD) 240 MG 24 hr capsule Take 240 mg by mouth daily.   12/29/2014 at Unknown time   . furosemide (LASIX) 20 MG tablet Take 20 mg by mouth daily.   12/29/2014 at Unknown time  . losartan (COZAAR) 100 MG tablet Take 100 mg by mouth every evening.    12/28/2014 at Unknown time  . predniSONE (DELTASONE) 5 MG tablet Take 5 mg by mouth daily.   12/29/2014 at Unknown time  . warfarin (COUMADIN) 4 MG tablet Take 2-4 mg by mouth daily. Alternate taking 4mg  every evening with 2mg  every evening   12/28/2014 at 2130    Assessment: Okay for Protocol, INR slightly below goal (1.99)  Goal of Therapy:  INR 2-3   Plan:  Warfarin 2 mg PO x 1 today. Daily PT/INR. Monitor for S/S bleeding.  Abner Greenspan, Xaiver Roskelley [place] 12/31/2014,8:06 AM

## 2014-12-31 NOTE — Discharge Summary (Signed)
Physician Discharge Summary  [name] [name] OXB:[id number] DOB: Feb 16, 1930 DOA: 12/29/2014  PCP: Gar Ponto, MD  Admit date: 12/29/2014 Discharge date: 12/31/2014  Time spent: 35 minutes  Recommendations for Outpatient Follow-up:  1. Follow up with your  PCP next week.   Discharge Diagnoses:  Active Problems:   Essential hypertension, benign   Chronic atrial fibrillation   Coronary atherosclerosis of native coronary artery   Acute diastolic CHF (congestive heart failure)   Acute diastolic congestive heart failure   Discharge Condition: improved.   Diet recommendation: 2 g NaCl.   Filed Weights   12/29/14 1816 12/30/14 0504 12/31/14 0507  Weight: 74.844 kg (165 lb) 76.25 kg (168 lb 1.6 oz) 75.615 kg (166 lb 11.2 oz)    History of present illness: Patient was admitted for acute on chronic CHF by Dr Anastasio Champion, in the setting of valvular disease, on Dec 29, 2014.  As per his H and P:  " [name] [name] is a 81 y.o. female  This is an 81 year old lady who has a previous history of mild aortic stenosis, paroxysmal atrial fibrillation and nonobstructive coronary artery disease by cardiac catheterization in 2004, now presents with one-week history of progressive dyspnea on minimal exertion associated with some lower leg edema. She denies any PND or orthopnea. She also has had it tightness in her chest intermittently at times brought on by exertion. Thankfully, this does not last very long. She is on chronic Coumadin therapy for her atrial fibrillation. She also has polymyalgia rheumatica and is on steroids. She is now being admitted for management of her heart failure.   Hospital Course: Patient was admitted to telemetry, and her anticoagulation was continued per pharmacy dosing.  Her INR was 1.99.  She was given a small amount of diuresis, and did well.  Her Troponins were negative.  ECHO was performed and showed EF of 60 percent.  She feels great and is anxious to go home.  She is stable for  discharge.  I will discharge her on home dose of Warfarin, and oral lasix at 20mg  BID.  She will see her PCP in follow up next week.  Thank you for allowing me to participate in her care.    Procedures:  ECHO.   Consultations:  None.   Discharge Exam: Filed Vitals:   12/31/14 0507  BP: 132/69  Pulse: 70  Temp: 98.1 F (36.7 C)  Resp: 18    General: Well Cardiovascular: S1S2.  Respiratory: clear.   Discharge Instructions   Discharge Instructions    Diet - low sodium heart healthy    Complete by:  As directed      Discharge instructions    Complete by:  As directed   Follow up with your PCP in one week.  You will need to have your coumadin level (INR) checked next week as well.     Increase activity slowly    Complete by:  As directed           Current Discharge Medication List    CONTINUE these medications which have CHANGED   Details  furosemide (LASIX) 20 MG tablet Take 1 tablet (20 mg total) by mouth 2 (two) times daily. Qty: 30 tablet, Refills: 1      CONTINUE these medications which have NOT CHANGED   Details  atenolol (TENORMIN) 50 MG tablet Take 50 mg by mouth every evening.     atorvastatin (LIPITOR) 40 MG tablet Take 40 mg by mouth at bedtime.  Biotin 1000 MCG tablet Take 1,000 mcg by mouth daily.    Calcium Carbonate-Vitamin D (CALTRATE 600+D) 600-400 MG-UNIT per tablet Take 1 tablet by mouth daily.      citalopram (CELEXA) 20 MG tablet Take 20 mg by mouth daily.    diltiazem (CARDIZEM CD) 240 MG 24 hr capsule Take 240 mg by mouth daily.    losartan (COZAAR) 100 MG tablet Take 100 mg by mouth every evening.     predniSONE (DELTASONE) 5 MG tablet Take 5 mg by mouth daily.    warfarin (COUMADIN) 4 MG tablet Take 2-4 mg by mouth daily. Alternate taking 4mg  every evening with 2mg  every evening       Allergies  Allergen Reactions  . Sulfonamide Derivatives Rash      The results of significant diagnostics from this hospitalization  (including imaging, microbiology, ancillary and laboratory) are listed below for reference.    Significant Diagnostic Studies: Dg Chest Portable 1 View  12/29/2014   CLINICAL DATA:  81 year old female with pain and back near ribs. Chills, sweating and shortness breath. Initial encounter.  EXAM: PORTABLE CHEST - 1 VIEW  COMPARISON:  12/23/2011.  FINDINGS: Cardiomegaly.  Central pulmonary vascular prominence.  No segmental consolidation or gross pneumothorax.  Mildly tortuous aorta.  No obvious osseous lesion.  The patient would eventually benefit from two view chest.  IMPRESSION: Cardiomegaly.  Mild central pulmonary vascular prominence.  No segmental consolidation.   Electronically Signed   By: Genia Del M.D.   On: 12/29/2014 14:57    Microbiology: No results found for this or any previous visit (from the past 240 hour(s)).   Labs: Basic Metabolic Panel:  Recent Labs Lab 12/29/14 1430 12/30/14 0544  NA 138 139  K 3.6 3.9  CL 104 103  CO2 22 26  GLUCOSE 133* 112*  BUN 15 17  CREATININE 0.93 0.99  CALCIUM 9.6 9.2   Liver Function Tests:  Recent Labs Lab 12/29/14 1430 12/30/14 0544  AST 24 18  ALT 19 18  ALKPHOS 77 64  BILITOT 0.9 0.8  PROT 7.8 6.7  ALBUMIN 4.4 3.8   CBC:  Recent Labs Lab 12/29/14 1430 12/30/14 0544  WBC 12.8* 9.3  NEUTROABS 8.5*  --   HGB 13.7 12.4  HCT 39.9 36.9  MCV 90.1 90.7  PLT 321 309   Cardiac Enzymes:  Recent Labs Lab 12/29/14 1430 12/29/14 1753 12/29/14 2312 12/30/14 0717  TROPONINI <0.03 <0.03 <0.03 <0.03     Recent Labs  12/29/14 1452 12/30/14 0544  BNP 396.0* 228.0*    Signed:  Tamarra Geiselman  [facility] 12/31/2014, 1:11 PM

## 2014-12-31 NOTE — Progress Notes (Signed)
Patient with orders to be discharge home. Discharge instructions given, patient verbalized understanding. Patient stable. Patient left in private vehicle with family.  

## 2015-01-04 ENCOUNTER — Ambulatory Visit (INDEPENDENT_AMBULATORY_CARE_PROVIDER_SITE_OTHER): Payer: Medicare Other | Admitting: Cardiology

## 2015-01-04 ENCOUNTER — Encounter: Payer: Self-pay | Admitting: Cardiology

## 2015-01-04 VITALS — BP 118/62 | HR 73 | Ht 67.0 in | Wt 171.0 lb

## 2015-01-04 DIAGNOSIS — I482 Chronic atrial fibrillation, unspecified: Secondary | ICD-10-CM

## 2015-01-04 DIAGNOSIS — I5032 Chronic diastolic (congestive) heart failure: Secondary | ICD-10-CM

## 2015-01-04 DIAGNOSIS — I2510 Atherosclerotic heart disease of native coronary artery without angina pectoris: Secondary | ICD-10-CM

## 2015-01-04 NOTE — Patient Instructions (Signed)
Your physician recommends that you schedule a follow-up appointment in:  3 months with Dr McDowell     Your physician recommends that you continue on your current medications as directed. Please refer to the Current Medication list given to you today.      Thank you for choosing [facility] !         

## 2015-01-04 NOTE — Progress Notes (Signed)
Cardiology Office Note  Date: 01/04/2015   ID: [name], DOB Jan 17, 1930, MRN [id number]  PCP: Gar Ponto, MD  Primary Cardiologist: Rozann Lesches, MD   Chief Complaint  Patient presents with  . Hospitalization Follow-up  . Atrial Fibrillation    History of Present Illness: [name] [name] is an 81 y.o. female last seen in the office in February. Record review finds recent hospitalization at [facility] with reported acute on chronic diastolic heart failure. BNP was only mildly increased at 396 and chest x-ray demonstrated mild central pulmonary vascular prominence but no Homan area edema or effusions. She was diuresed with a weight loss of 3 pounds and felt better. Follow-up echocardiogram is reviewed below.  She is here today with her husband for a follow-up visit. Feels better in general, weight has increased a few pounds. She is on Lasix 40 mg total daily at this time. We discussed adjusting Lasix on her own based on daily weights. Also sodium restriction and fluid restriction guidelines.  We went over the results of her echocardiogram. She has overall mild valvular disease, mildly elevated pulmonary pressures.   Past Medical History  Diagnosis Date  . Polymyalgia rheumatica   . Stroke   . Essential hypertension, benign   . Hyperlipidemia   . Paroxysmal atrial fibrillation     On Coumadin  . Coronary atherosclerosis of native coronary artery     Nonobstructive at cardiac catheterization 2004  . Dysphagia      Current Outpatient Prescriptions  Medication Sig Dispense Refill  . atenolol (TENORMIN) 50 MG tablet Take 50 mg by mouth every evening.     Marland Kitchen atorvastatin (LIPITOR) 40 MG tablet Take 40 mg by mouth at bedtime.     . Biotin 1000 MCG tablet Take 1,000 mcg by mouth daily.    . Calcium Carbonate-Vitamin D (CALTRATE 600+D) 600-400 MG-UNIT per tablet Take 1 tablet by mouth daily.      . citalopram (CELEXA) 20 MG tablet Take 20 mg by mouth daily.    Marland Kitchen diltiazem  (CARDIZEM CD) 240 MG 24 hr capsule Take 240 mg by mouth daily.    . furosemide (LASIX) 20 MG tablet Take 1 tablet (20 mg total) by mouth 2 (two) times daily. 30 tablet 1  . losartan (COZAAR) 100 MG tablet Take 100 mg by mouth every evening.     . predniSONE (DELTASONE) 5 MG tablet Take 5 mg by mouth daily.    Marland Kitchen warfarin (COUMADIN) 4 MG tablet Take 2-4 mg by mouth daily. Alternate taking 4mg  every evening with 2mg  every evening     No current facility-administered medications for this visit.    Allergies:  Sulfonamide derivatives   Social History: The patient  reports that she has never smoked. She has never used smokeless tobacco. She reports that she does not drink alcohol or use illicit drugs.   ROS:  Please see the history of present illness. Otherwise, complete review of systems is positive for none.  All other systems are reviewed and negative.   Physical Exam: VS:  BP 118/62 mmHg  Pulse 73  Ht 5\' 7"  (1.702 m)  Wt 171 lb (77.565 kg)  BMI 26.78 kg/m2  SpO2 99%, BMI Body mass index is 26.78 kg/(m^2).  Wt Readings from Last 3 Encounters:  01/04/15 171 lb (77.565 kg)  12/31/14 166 lb 11.2 oz (75.615 kg)  10/06/14 176 lb (79.833 kg)     Overweight elderly woman, appears comfortable at rest. HEENT: Conjunctiva and  lids normal, oropharynx clear. Neck: Supple, no elevated JVP, no thyromegaly. Lungs: Clear to auscultation, nonlabored breathing at rest. Cardiac: Irregularly irregular, no S3, soft systolic murmur, no pericardial rub. Abdomen: Soft, nontender, bowel sounds present, no guarding or rebound. Extremities: No pitting edema, distal pulses 2+.   ECG: Tracing from 12/30/2014 showed rate-controlled atrial fibrillation with IVCD.  Recent Labwork: 12/29/2014: TSH 0.576 12/30/2014: ALT 18; AST 18; B Natriuretic Peptide 228.0*; BUN 17; Creatinine 0.99; Hemoglobin 12.4; Platelets 309; Potassium 3.9; Sodium 139     Component Value Date/Time   CHOL 190 12/24/2011 0545   TRIG 139  12/24/2011 0545   HDL 36* 12/24/2011 0545   CHOLHDL 5.3 12/24/2011 0545   VLDL 28 12/24/2011 0545   LDLCALC 126* 12/24/2011 0545    Other Studies Reviewed Today:  Echocardiogram 12/30/2014: Study Conclusions  - Left ventricle: The cavity size was normal. There was mild concentric hypertrophy. Systolic function was normal. The estimated ejection fraction was in the range of 60% to 65%. Wall motion was normal; there were no regional wall motion abnormalities. The study was not technically sufficient to allow evaluation of LV diastolic dysfunction due to atrial fibrillation. - Ventricular septum: Septal motion showed abnormal function and dyssynergy. These changes are consistent with intraventricular conduction delay. - Aortic valve: Mildly calcified annulus. Trileaflet; mildly thickened, mildly calcified leaflets. - Mitral valve: Mildly calcified annulus. Mildly thickened leaflets . There was mild regurgitation. - Left atrium: The atrium was severely dilated. Volume/bsa, S: 58.1 ml/m^2. - Right ventricle: Systolic function was mildly reduced. - Right atrium: The atrium was mildly dilated. - Tricuspid valve: There was mild regurgitation. - Pulmonic valve: Mildly elevated pulmonary pressures (34 mmHg).   ASSESSMENT AND PLAN:  1. Recent episode of suspected acute on chronic diastolic heart failure. Plan to continue Lasix 20 mg twice daily, although she can take an additional dose if her weight increases by 2-3 pounds in 24-48 hours. She states she has a scale at home and has been following her weight. We also discussed sodium and fluid restriction guidelines.  2. Chronic atrial fibrillation, continue Coumadin, Cardizem CD and atenolol.  Current medicines were reviewed at length with the patient today.   Disposition: FU with me in 3 months.   Signed, Satira Sark, MD, [facility] 01/04/2015 12:04 PM    [place] at [facility] [address] [place], [place] [address] Phone: [phone]; Fax: [phone]

## 2015-01-10 DIAGNOSIS — I10 Essential (primary) hypertension: Secondary | ICD-10-CM | POA: Diagnosis not present

## 2015-01-10 DIAGNOSIS — N183 Chronic kidney disease, stage 3 (moderate): Secondary | ICD-10-CM | POA: Diagnosis not present

## 2015-01-10 DIAGNOSIS — K210 Gastro-esophageal reflux disease with esophagitis: Secondary | ICD-10-CM | POA: Diagnosis not present

## 2015-01-10 DIAGNOSIS — E782 Mixed hyperlipidemia: Secondary | ICD-10-CM | POA: Diagnosis not present

## 2015-01-10 DIAGNOSIS — I482 Chronic atrial fibrillation: Secondary | ICD-10-CM | POA: Diagnosis not present

## 2015-01-12 DIAGNOSIS — I482 Chronic atrial fibrillation: Secondary | ICD-10-CM | POA: Diagnosis not present

## 2015-01-18 DIAGNOSIS — I482 Chronic atrial fibrillation: Secondary | ICD-10-CM | POA: Diagnosis not present

## 2015-01-30 DIAGNOSIS — I5032 Chronic diastolic (congestive) heart failure: Secondary | ICD-10-CM | POA: Diagnosis not present

## 2015-01-30 DIAGNOSIS — I482 Chronic atrial fibrillation: Secondary | ICD-10-CM | POA: Diagnosis not present

## 2015-02-01 DIAGNOSIS — I482 Chronic atrial fibrillation: Secondary | ICD-10-CM | POA: Diagnosis not present

## 2015-02-22 DIAGNOSIS — I482 Chronic atrial fibrillation: Secondary | ICD-10-CM | POA: Diagnosis not present

## 2015-03-24 DIAGNOSIS — I482 Chronic atrial fibrillation: Secondary | ICD-10-CM | POA: Diagnosis not present

## 2015-04-05 DIAGNOSIS — I10 Essential (primary) hypertension: Secondary | ICD-10-CM | POA: Diagnosis not present

## 2015-04-05 DIAGNOSIS — E782 Mixed hyperlipidemia: Secondary | ICD-10-CM | POA: Diagnosis not present

## 2015-04-05 DIAGNOSIS — E039 Hypothyroidism, unspecified: Secondary | ICD-10-CM | POA: Diagnosis not present

## 2015-04-11 DIAGNOSIS — I5032 Chronic diastolic (congestive) heart failure: Secondary | ICD-10-CM | POA: Diagnosis not present

## 2015-04-11 DIAGNOSIS — N183 Chronic kidney disease, stage 3 (moderate): Secondary | ICD-10-CM | POA: Diagnosis not present

## 2015-04-11 DIAGNOSIS — I350 Nonrheumatic aortic (valve) stenosis: Secondary | ICD-10-CM | POA: Diagnosis not present

## 2015-04-11 DIAGNOSIS — E782 Mixed hyperlipidemia: Secondary | ICD-10-CM | POA: Diagnosis not present

## 2015-04-11 DIAGNOSIS — I10 Essential (primary) hypertension: Secondary | ICD-10-CM | POA: Diagnosis not present

## 2015-04-11 DIAGNOSIS — E8881 Metabolic syndrome: Secondary | ICD-10-CM | POA: Diagnosis not present

## 2015-04-11 DIAGNOSIS — I482 Chronic atrial fibrillation: Secondary | ICD-10-CM | POA: Diagnosis not present

## 2015-04-11 DIAGNOSIS — K219 Gastro-esophageal reflux disease without esophagitis: Secondary | ICD-10-CM | POA: Diagnosis not present

## 2015-04-11 DIAGNOSIS — F331 Major depressive disorder, recurrent, moderate: Secondary | ICD-10-CM | POA: Diagnosis not present

## 2015-04-17 ENCOUNTER — Ambulatory Visit (INDEPENDENT_AMBULATORY_CARE_PROVIDER_SITE_OTHER): Payer: Medicare Other | Admitting: Cardiology

## 2015-04-17 ENCOUNTER — Encounter: Payer: Self-pay | Admitting: Cardiology

## 2015-04-17 ENCOUNTER — Encounter: Payer: Self-pay | Admitting: *Deleted

## 2015-04-17 VITALS — BP 144/72 | HR 71 | Ht 66.0 in | Wt 168.0 lb

## 2015-04-17 DIAGNOSIS — R0602 Shortness of breath: Secondary | ICD-10-CM | POA: Diagnosis not present

## 2015-04-17 DIAGNOSIS — I10 Essential (primary) hypertension: Secondary | ICD-10-CM

## 2015-04-17 DIAGNOSIS — I481 Persistent atrial fibrillation: Secondary | ICD-10-CM

## 2015-04-17 DIAGNOSIS — I4819 Other persistent atrial fibrillation: Secondary | ICD-10-CM

## 2015-04-17 DIAGNOSIS — I2510 Atherosclerotic heart disease of native coronary artery without angina pectoris: Secondary | ICD-10-CM

## 2015-04-17 NOTE — Patient Instructions (Signed)
Your physician recommends that you continue on your current medications as directed. Please refer to the Current Medication list given to you today. Your physician has requested that you have a lexiscan myoview. For further information please visit [email or web address]. Please follow instruction sheet, as given. Your physician recommends that you schedule a follow-up appointment in: 3 months.

## 2015-04-17 NOTE — Progress Notes (Signed)
Cardiology Office Note  Date: 04/17/2015   ID: [name] [name], DOB June 11, 1930, MRN [id number]  PCP: Gar Ponto, MD  Primary Cardiologist: Rozann Lesches, MD   Chief Complaint  Patient presents with  . Atrial Fibrillation    History of Present Illness: [name] [name] is an 81 y.o. female last seen in May. She presents today for a routine follow-up visit. She complains of increasing dyspnea on exertion, no chest pain. Still functional in her basic ADLs. She does not endorse any palpitations and remains compliant with her medications.  She continues on Toprol-XL, Cardizem CD, and Coumadin. She is followed in the anticoagulation clinic, denies any bleeding problems.  She continues to follow with Dr. Quillian Quince. Weight is down 3 pounds from the last visit.  She has been in persistent atrial fibrillation for some time now, heart rate looks to be adequately controlled by ECGs her current regimen.  She has a previous history of nonobstructive CAD by cardiac catheterization in 2004. Follow-up ischemic testing in 2013 was low risk. Most recent echocardiogram is noted below from May.    Past Medical History  Diagnosis Date  . Polymyalgia rheumatica   . Stroke   . Essential hypertension, benign   . Hyperlipidemia   . Paroxysmal atrial fibrillation     On Coumadin  . Coronary atherosclerosis of native coronary artery     Nonobstructive at cardiac catheterization 2004  . Dysphagia     Past Surgical History  Procedure Laterality Date  . Total abdominal hysterectomy    . Appendectomy    . Left ankle    . Toe surgery    . Colonoscopy  2008    Dr.Fleshiman    Current Outpatient Prescriptions  Medication Sig Dispense Refill  . atenolol (TENORMIN) 50 MG tablet Take 50 mg by mouth every evening.     Marland Kitchen atorvastatin (LIPITOR) 40 MG tablet Take 40 mg by mouth at bedtime.     . Biotin 1000 MCG tablet Take 1,000 mcg by mouth daily.    . Calcium Carbonate-Vitamin D (CALTRATE 600+D)  600-400 MG-UNIT per tablet Take 1 tablet by mouth daily.      . citalopram (CELEXA) 20 MG tablet Take 20 mg by mouth daily.    Marland Kitchen diltiazem (CARDIZEM CD) 240 MG 24 hr capsule Take 240 mg by mouth daily.    . furosemide (LASIX) 20 MG tablet Take 1 tablet (20 mg total) by mouth 2 (two) times daily. 30 tablet 1  . losartan (COZAAR) 100 MG tablet Take 100 mg by mouth every evening.     . predniSONE (DELTASONE) 5 MG tablet Take 5 mg by mouth daily.    Marland Kitchen warfarin (COUMADIN) 4 MG tablet Take 2-4 mg by mouth daily. Alternate taking 4mg  every evening with 2mg  every evening     No current facility-administered medications for this visit.    Allergies:  Sulfonamide derivatives   Social History: The patient  reports that she has never smoked. She has never used smokeless tobacco. She reports that she does not drink alcohol or use illicit drugs.   ROS:  Please see the history of present illness. Otherwise, complete review of systems is positive for none.  All other systems are reviewed and negative.   Physical Exam: VS:  BP 144/72 mmHg  Pulse 71  Ht 5\' 6"  (1.676 m)  Wt 168 lb (76.204 kg)  BMI 27.13 kg/m2  SpO2 98%, BMI Body mass index is 27.13 kg/(m^2).  Wt Readings from Last  3 Encounters:  04/17/15 168 lb (76.204 kg)  01/04/15 171 lb (77.565 kg)  12/31/14 166 lb 11.2 oz (75.615 kg)     General: Patient appears comfortable at rest. HEENT: Conjunctiva and lids normal, oropharynx clear with moist mucosa. Neck: Supple, no elevated JVP or carotid bruits, no thyromegaly. Lungs: Clear to auscultation, nonlabored breathing at rest. Cardiac: Irregularly irregular, no S3 or significant systolic murmur, no pericardial rub. Abdomen: Soft, nontender, no hepatomegaly, bowel sounds present, no guarding or rebound. Extremities: No pitting edema, distal pulses 2+. Skin: Warm and dry. Musculoskeletal: No kyphosis. Neuropsychiatric: Alert and oriented x3, affect grossly appropriate.   ECG: ECG is not  ordered today.   Recent Labwork: 12/29/2014: TSH 0.576 12/30/2014: ALT 18; AST 18; B Natriuretic Peptide 228.0*; BUN 17; Creatinine, Ser 0.99; Hemoglobin 12.4; Platelets 309; Potassium 3.9; Sodium 139     Component Value Date/Time   CHOL 190 12/24/2011 0545   TRIG 139 12/24/2011 0545   HDL 36* 12/24/2011 0545   CHOLHDL 5.3 12/24/2011 0545   VLDL 28 12/24/2011 0545   LDLCALC 126* 12/24/2011 0545    Other Studies Reviewed Today:  Echocardiogram 12/30/2014: Study Conclusions  - Left ventricle: The cavity size was normal. There was mild concentric hypertrophy. Systolic function was normal. The estimated ejection fraction was in the range of 60% to 65%. Wall motion was normal; there were no regional wall motion abnormalities. The study was not technically sufficient to allow evaluation of LV diastolic dysfunction due to atrial fibrillation. - Ventricular septum: Septal motion showed abnormal function and dyssynergy. These changes are consistent with intraventricular conduction delay. - Aortic valve: Mildly calcified annulus. Trileaflet; mildly thickened, mildly calcified leaflets. - Mitral valve: Mildly calcified annulus. Mildly thickened leaflets . There was mild regurgitation. - Left atrium: The atrium was severely dilated. Volume/bsa, S: 58.1 ml/m^2. - Right ventricle: Systolic function was mildly reduced. - Right atrium: The atrium was mildly dilated. - Tricuspid valve: There was mild regurgitation. - Pulmonic valve: Mildly elevated pulmonary pressures (34 mmHg).   Assessment and Plan:  1. Paroxysmal to persistent atrial fibrillation. It looks like she has been in atrial fibrillation for some time now, possibly related to her shortness of breath, although heart rate control looks to be adequate.  2. Increasing dyspnea on exertion. We will obtain a follow-up Lexiscan Cardiolite to reassess ischemic burden with prior history of nonobstructive CAD and  last testing being in 2013. If low risk, we can discuss the possibility of attempting a cardioversion to see if this makes any difference in her symptoms.  3. Essential hypertension, no changes made to medical regimen.  Current medicines were reviewed with the patient today.   Orders Placed This Encounter  Procedures  . NM Myocar Multi W/Spect W/Wall Motion / EF  . Myocardial Perfusion Imaging    Disposition: FU with me in 3 months.   Signed, Satira Sark, MD, [facility] 04/17/2015 11:34 AM    [place] at [place], [place], [place] [address] Phone: [phone]; Fax: [phone]

## 2015-04-21 DIAGNOSIS — I482 Chronic atrial fibrillation: Secondary | ICD-10-CM | POA: Diagnosis not present

## 2015-04-26 ENCOUNTER — Inpatient Hospital Stay (HOSPITAL_COMMUNITY): Admission: RE | Admit: 2015-04-26 | Payer: Medicare Other | Source: Ambulatory Visit

## 2015-04-26 ENCOUNTER — Encounter (HOSPITAL_COMMUNITY)
Admission: RE | Admit: 2015-04-26 | Discharge: 2015-04-26 | Disposition: A | Discharge status: Home / Self Care | Payer: Medicare Other | Source: Ambulatory Visit | Attending: Cardiology | Admitting: Cardiology

## 2015-04-26 ENCOUNTER — Encounter (HOSPITAL_COMMUNITY): Payer: Self-pay

## 2015-04-26 DIAGNOSIS — R0602 Shortness of breath: Secondary | ICD-10-CM | POA: Diagnosis not present

## 2015-04-26 LAB — NM MYOCAR MULTI W/SPECT W/WALL MOTION / EF
CHL CUP NUCLEAR SDS: 2
CHL CUP RESTING HR STRESS: 59 {beats}/min
LV sys vol: 31 mL
LVDIAVOL: 83 mL
Peak HR: 73 {beats}/min
RATE: 0.39
SRS: 0
SSS: 2
TID: 1.2

## 2015-04-26 MED ORDER — REGADENOSON 0.4 MG/5ML IV SOLN
INTRAVENOUS | Status: AC
  Administered 2015-04-26: 0.4 mg via INTRAVENOUS
  Filled 2015-04-26: qty 5

## 2015-04-26 MED ORDER — SODIUM CHLORIDE 0.9 % IJ SOLN
INTRAMUSCULAR | Status: AC
  Administered 2015-04-26: 10 mL via INTRAVENOUS
  Filled 2015-04-26: qty 3

## 2015-04-26 MED ORDER — TECHNETIUM TC 99M SESTAMIBI GENERIC - CARDIOLITE
10.0000 | Freq: Once | INTRAVENOUS | Status: AC | PRN
  Administered 2015-04-26: 10 via INTRAVENOUS

## 2015-04-26 MED ORDER — TECHNETIUM TC 99M SESTAMIBI - CARDIOLITE
30.0000 | Freq: Once | INTRAVENOUS | Status: AC | PRN
  Administered 2015-04-26: 30 via INTRAVENOUS

## 2015-04-27 ENCOUNTER — Telehealth: Payer: Self-pay | Admitting: *Deleted

## 2015-04-27 DIAGNOSIS — I639 Cerebral infarction, unspecified: Secondary | ICD-10-CM | POA: Diagnosis not present

## 2015-04-27 DIAGNOSIS — I4891 Unspecified atrial fibrillation: Secondary | ICD-10-CM | POA: Diagnosis not present

## 2015-04-27 NOTE — Telephone Encounter (Signed)
Pt aware - routed to pcp  

## 2015-04-27 NOTE — Telephone Encounter (Signed)
-----   Message from Satira Sark, MD sent at 04/27/2015  8:55 AM EDT ----- Reviewed. Please let her know that the stress test was normal, argues against any obstructive CAD at this point.

## 2015-05-12 DIAGNOSIS — I482 Chronic atrial fibrillation: Secondary | ICD-10-CM | POA: Diagnosis not present

## 2015-05-18 DIAGNOSIS — E780 Pure hypercholesterolemia: Secondary | ICD-10-CM | POA: Diagnosis not present

## 2015-05-18 DIAGNOSIS — Z90710 Acquired absence of both cervix and uterus: Secondary | ICD-10-CM | POA: Diagnosis not present

## 2015-05-18 DIAGNOSIS — Z79899 Other long term (current) drug therapy: Secondary | ICD-10-CM | POA: Diagnosis not present

## 2015-05-18 DIAGNOSIS — Z780 Asymptomatic menopausal state: Secondary | ICD-10-CM | POA: Diagnosis not present

## 2015-05-18 DIAGNOSIS — I509 Heart failure, unspecified: Secondary | ICD-10-CM | POA: Diagnosis not present

## 2015-05-18 DIAGNOSIS — M8589 Other specified disorders of bone density and structure, multiple sites: Secondary | ICD-10-CM | POA: Diagnosis not present

## 2015-05-18 DIAGNOSIS — M1990 Unspecified osteoarthritis, unspecified site: Secondary | ICD-10-CM | POA: Diagnosis not present

## 2015-05-18 DIAGNOSIS — I10 Essential (primary) hypertension: Secondary | ICD-10-CM | POA: Diagnosis not present

## 2015-05-24 DIAGNOSIS — Z23 Encounter for immunization: Secondary | ICD-10-CM | POA: Diagnosis not present

## 2015-06-06 DIAGNOSIS — I10 Essential (primary) hypertension: Secondary | ICD-10-CM | POA: Diagnosis not present

## 2015-06-06 DIAGNOSIS — K210 Gastro-esophageal reflux disease with esophagitis: Secondary | ICD-10-CM | POA: Diagnosis not present

## 2015-06-06 DIAGNOSIS — E8881 Metabolic syndrome: Secondary | ICD-10-CM | POA: Diagnosis not present

## 2015-06-06 DIAGNOSIS — E782 Mixed hyperlipidemia: Secondary | ICD-10-CM | POA: Diagnosis not present

## 2015-06-13 DIAGNOSIS — K219 Gastro-esophageal reflux disease without esophagitis: Secondary | ICD-10-CM | POA: Diagnosis not present

## 2015-06-13 DIAGNOSIS — I5032 Chronic diastolic (congestive) heart failure: Secondary | ICD-10-CM | POA: Diagnosis not present

## 2015-06-13 DIAGNOSIS — R300 Dysuria: Secondary | ICD-10-CM | POA: Diagnosis not present

## 2015-06-13 DIAGNOSIS — N183 Chronic kidney disease, stage 3 (moderate): Secondary | ICD-10-CM | POA: Diagnosis not present

## 2015-06-13 DIAGNOSIS — F331 Major depressive disorder, recurrent, moderate: Secondary | ICD-10-CM | POA: Diagnosis not present

## 2015-06-13 DIAGNOSIS — I482 Chronic atrial fibrillation: Secondary | ICD-10-CM | POA: Diagnosis not present

## 2015-06-13 DIAGNOSIS — R7301 Impaired fasting glucose: Secondary | ICD-10-CM | POA: Diagnosis not present

## 2015-06-13 DIAGNOSIS — E8881 Metabolic syndrome: Secondary | ICD-10-CM | POA: Diagnosis not present

## 2015-06-13 DIAGNOSIS — M545 Low back pain: Secondary | ICD-10-CM | POA: Diagnosis not present

## 2015-06-13 DIAGNOSIS — I10 Essential (primary) hypertension: Secondary | ICD-10-CM | POA: Diagnosis not present

## 2015-06-13 DIAGNOSIS — E782 Mixed hyperlipidemia: Secondary | ICD-10-CM | POA: Diagnosis not present

## 2015-06-19 DIAGNOSIS — I350 Nonrheumatic aortic (valve) stenosis: Secondary | ICD-10-CM | POA: Diagnosis not present

## 2015-06-19 DIAGNOSIS — I4891 Unspecified atrial fibrillation: Secondary | ICD-10-CM | POA: Diagnosis not present

## 2015-06-20 DIAGNOSIS — M47816 Spondylosis without myelopathy or radiculopathy, lumbar region: Secondary | ICD-10-CM | POA: Diagnosis not present

## 2015-06-23 DIAGNOSIS — J189 Pneumonia, unspecified organism: Secondary | ICD-10-CM | POA: Diagnosis not present

## 2015-06-23 DIAGNOSIS — R0902 Hypoxemia: Secondary | ICD-10-CM | POA: Diagnosis not present

## 2015-06-23 DIAGNOSIS — J209 Acute bronchitis, unspecified: Secondary | ICD-10-CM | POA: Diagnosis not present

## 2015-06-23 DIAGNOSIS — I482 Chronic atrial fibrillation: Secondary | ICD-10-CM | POA: Diagnosis not present

## 2015-06-23 DIAGNOSIS — I10 Essential (primary) hypertension: Secondary | ICD-10-CM | POA: Diagnosis not present

## 2015-06-23 DIAGNOSIS — R110 Nausea: Secondary | ICD-10-CM | POA: Diagnosis not present

## 2015-06-23 DIAGNOSIS — Z7901 Long term (current) use of anticoagulants: Secondary | ICD-10-CM | POA: Diagnosis not present

## 2015-06-23 DIAGNOSIS — D72829 Elevated white blood cell count, unspecified: Secondary | ICD-10-CM | POA: Diagnosis not present

## 2015-06-23 DIAGNOSIS — R404 Transient alteration of awareness: Secondary | ICD-10-CM | POA: Diagnosis not present

## 2015-06-23 DIAGNOSIS — M353 Polymyalgia rheumatica: Secondary | ICD-10-CM | POA: Diagnosis not present

## 2015-06-23 DIAGNOSIS — R070 Pain in throat: Secondary | ICD-10-CM | POA: Diagnosis not present

## 2015-06-23 DIAGNOSIS — I5032 Chronic diastolic (congestive) heart failure: Secondary | ICD-10-CM | POA: Diagnosis not present

## 2015-06-23 DIAGNOSIS — Z79899 Other long term (current) drug therapy: Secondary | ICD-10-CM | POA: Diagnosis not present

## 2015-06-23 DIAGNOSIS — E785 Hyperlipidemia, unspecified: Secondary | ICD-10-CM | POA: Diagnosis not present

## 2015-06-23 DIAGNOSIS — I509 Heart failure, unspecified: Secondary | ICD-10-CM | POA: Diagnosis not present

## 2015-06-23 DIAGNOSIS — R531 Weakness: Secondary | ICD-10-CM | POA: Diagnosis not present

## 2015-06-23 DIAGNOSIS — Z882 Allergy status to sulfonamides status: Secondary | ICD-10-CM | POA: Diagnosis not present

## 2015-06-23 DIAGNOSIS — R05 Cough: Secondary | ICD-10-CM | POA: Diagnosis not present

## 2015-06-23 DIAGNOSIS — J309 Allergic rhinitis, unspecified: Secondary | ICD-10-CM | POA: Diagnosis not present

## 2015-06-25 DIAGNOSIS — A481 Legionnaires' disease: Secondary | ICD-10-CM | POA: Diagnosis not present

## 2015-06-28 DIAGNOSIS — R0602 Shortness of breath: Secondary | ICD-10-CM | POA: Diagnosis not present

## 2015-06-28 DIAGNOSIS — I482 Chronic atrial fibrillation: Secondary | ICD-10-CM | POA: Diagnosis not present

## 2015-06-30 DIAGNOSIS — M47816 Spondylosis without myelopathy or radiculopathy, lumbar region: Secondary | ICD-10-CM | POA: Diagnosis not present

## 2015-06-30 DIAGNOSIS — M545 Low back pain: Secondary | ICD-10-CM | POA: Diagnosis not present

## 2015-07-03 DIAGNOSIS — I482 Chronic atrial fibrillation: Secondary | ICD-10-CM | POA: Diagnosis not present

## 2015-07-07 DIAGNOSIS — T733XXA Exhaustion due to excessive exertion, initial encounter: Secondary | ICD-10-CM | POA: Diagnosis not present

## 2015-07-07 DIAGNOSIS — E039 Hypothyroidism, unspecified: Secondary | ICD-10-CM | POA: Diagnosis not present

## 2015-07-07 DIAGNOSIS — M2510 Fistula, unspecified joint: Secondary | ICD-10-CM | POA: Diagnosis not present

## 2015-07-07 DIAGNOSIS — R5382 Chronic fatigue, unspecified: Secondary | ICD-10-CM | POA: Diagnosis not present

## 2015-07-10 DIAGNOSIS — I482 Chronic atrial fibrillation: Secondary | ICD-10-CM | POA: Diagnosis not present

## 2015-07-17 DIAGNOSIS — M545 Low back pain: Secondary | ICD-10-CM | POA: Diagnosis not present

## 2015-07-17 DIAGNOSIS — K210 Gastro-esophageal reflux disease with esophagitis: Secondary | ICD-10-CM | POA: Diagnosis not present

## 2015-07-17 DIAGNOSIS — I482 Chronic atrial fibrillation: Secondary | ICD-10-CM | POA: Diagnosis not present

## 2015-07-17 DIAGNOSIS — M5136 Other intervertebral disc degeneration, lumbar region: Secondary | ICD-10-CM | POA: Diagnosis not present

## 2015-07-17 DIAGNOSIS — R5382 Chronic fatigue, unspecified: Secondary | ICD-10-CM | POA: Diagnosis not present

## 2015-07-17 DIAGNOSIS — M47816 Spondylosis without myelopathy or radiculopathy, lumbar region: Secondary | ICD-10-CM | POA: Diagnosis not present

## 2015-07-17 DIAGNOSIS — G8929 Other chronic pain: Secondary | ICD-10-CM | POA: Diagnosis not present

## 2015-07-25 ENCOUNTER — Encounter: Payer: Self-pay | Admitting: Cardiology

## 2015-07-25 ENCOUNTER — Ambulatory Visit (INDEPENDENT_AMBULATORY_CARE_PROVIDER_SITE_OTHER): Payer: Medicare Other | Admitting: Cardiology

## 2015-07-25 VITALS — BP 140/60 | HR 60 | Ht 66.0 in | Wt 164.0 lb

## 2015-07-25 DIAGNOSIS — I482 Chronic atrial fibrillation: Secondary | ICD-10-CM | POA: Diagnosis not present

## 2015-07-25 DIAGNOSIS — I2510 Atherosclerotic heart disease of native coronary artery without angina pectoris: Secondary | ICD-10-CM | POA: Diagnosis not present

## 2015-07-25 DIAGNOSIS — I10 Essential (primary) hypertension: Secondary | ICD-10-CM

## 2015-07-25 DIAGNOSIS — I480 Paroxysmal atrial fibrillation: Secondary | ICD-10-CM | POA: Diagnosis not present

## 2015-07-25 DIAGNOSIS — J200 Acute bronchitis due to Mycoplasma pneumoniae: Secondary | ICD-10-CM | POA: Diagnosis not present

## 2015-07-25 DIAGNOSIS — R0602 Shortness of breath: Secondary | ICD-10-CM | POA: Diagnosis not present

## 2015-07-25 NOTE — Progress Notes (Signed)
Cardiology Office Note  Date: 07/25/2015   ID: [name] [name], DOB 01/10/1930, MRN [id number]  PCP: Gar Ponto, MD  Primary Cardiologist: Rozann Lesches, MD   Chief Complaint  Patient presents with  . Atrial Fibrillation  . Coronary Artery Disease    History of Present Illness: [name] [name] is an 81 y.o. female last seen in August. She presents for a routine visit today. She does not report any increasing angina or dyspnea on exertion over baseline. We reviewed her cardiac medications which are outlined below. There have been no significant changes.  She underwent follow-up ischemic testing back in September, had a low risk Cardiolite at that time. We discussed the results again today. We'll plan to continue medical therapy and observation.  She is followed by [facility] and saw Mr. Donna Christen just recently for discussion regarding epidural injection for pain control. She has lower back pain with multilevel DDD, worse in the lumbar region. She is on Coumadin which is followed by Dr. Quillian Quince, and she plans to discuss this injection further as it relates to discontinuation of Coumadin and potential bridging with Lovenox in light of her prior history of stroke.  Past Medical History  Diagnosis Date  . Polymyalgia rheumatica ([place])   . Stroke ([place])   . Essential hypertension, benign   . Hyperlipidemia   . Paroxysmal atrial fibrillation ([facility])     On Coumadin  . Coronary atherosclerosis of native coronary artery     Nonobstructive at cardiac catheterization 2004  . Dysphagia     Current Outpatient Prescriptions  Medication Sig Dispense Refill  . atenolol (TENORMIN) 50 MG tablet Take 50 mg by mouth every evening.     Marland Kitchen atorvastatin (LIPITOR) 40 MG tablet Take 40 mg by mouth at bedtime.     . Biotin 1000 MCG tablet Take 1,000 mcg by mouth daily.    . Calcium Carbonate-Vitamin D (CALTRATE 600+D) 600-400 MG-UNIT per tablet Take 1 tablet by mouth daily.      .  citalopram (CELEXA) 20 MG tablet Take 20 mg by mouth daily.    Marland Kitchen diltiazem (CARDIZEM CD) 240 MG 24 hr capsule Take 240 mg by mouth daily.    . furosemide (LASIX) 20 MG tablet Take 1 tablet (20 mg total) by mouth 2 (two) times daily. 30 tablet 1  . losartan (COZAAR) 100 MG tablet Take 100 mg by mouth every evening.     . predniSONE (DELTASONE) 5 MG tablet Take 5 mg by mouth daily.    Marland Kitchen warfarin (COUMADIN) 4 MG tablet Take 2-4 mg by mouth daily. Alternate taking 4mg  every evening with 2mg  every evening     No current facility-administered medications for this visit.    Allergies:  Sulfonamide derivatives   Social History: The patient  reports that she has never smoked. She has never used smokeless tobacco. She reports that she does not drink alcohol or use illicit drugs.   ROS:  Please see the history of present illness. Otherwise, complete review of systems is positive for chronic lower back pain.  All other systems are reviewed and negative.   Physical Exam: VS:  BP 140/60 mmHg  Pulse 60  Ht 5\' 6"  (1.676 m)  Wt 164 lb (74.39 kg)  BMI 26.48 kg/m2, BMI Body mass index is 26.48 kg/(m^2).  Wt Readings from Last 3 Encounters:  07/25/15 164 lb (74.39 kg)  04/17/15 168 lb (76.204 kg)  01/04/15 171 lb (77.565 kg)    General: Patient  appears comfortable at rest. HEENT: Conjunctiva and lids normal, oropharynx clear with moist mucosa. Neck: Supple, no elevated JVP or carotid bruits, no thyromegaly. Lungs: Clear to auscultation, nonlabored breathing at rest. Cardiac: RRR, no S3 or significant systolic murmur, no pericardial rub. Abdomen: Soft, nontender, no hepatomegaly, bowel sounds present, no guarding or rebound. Extremities: No pitting edema, distal pulses 2+.  ECG: Tracing from 12/29/2014 showed rate-controlled atrial fibrillation with IVCD and poor R-wave progression, rule out old anterior infarct pattern.  Recent Labwork: 12/29/2014: TSH 0.576 12/30/2014: ALT 18; AST 18; B Natriuretic  Peptide 228.0*; BUN 17; Creatinine, Ser 0.99; Hemoglobin 12.4; Platelets 309; Potassium 3.9; Sodium 139   Other Studies Reviewed Today:  Lexiscan Cardiolite 04/26/2015:  There was no ST segment deviation noted during stress.  The study is normal.  This is a low risk study.  Nuclear stress EF: 62%.  Echocardiogram 12/30/2014: Study Conclusions  - Left ventricle: The cavity size was normal. There was mild concentric hypertrophy. Systolic function was normal. The estimated ejection fraction was in the range of 60% to 65%. Wall motion was normal; there were no regional wall motion abnormalities. The study was not technically sufficient to allow evaluation of LV diastolic dysfunction due to atrial fibrillation. - Ventricular septum: Septal motion showed abnormal function and dyssynergy. These changes are consistent with intraventricular conduction delay. - Aortic valve: Mildly calcified annulus. Trileaflet; mildly thickened, mildly calcified leaflets. - Mitral valve: Mildly calcified annulus. Mildly thickened leaflets . There was mild regurgitation. - Left atrium: The atrium was severely dilated. Volume/bsa, S: 58.1 ml/m^2. - Right ventricle: Systolic function was mildly reduced. - Right atrium: The atrium was mildly dilated. - Tricuspid valve: There was mild regurgitation. - Pulmonic valve: Mildly elevated pulmonary pressures (34 mmHg).  Assessment and Plan:  1. History of nonobstructive CAD based on previous cardiac catheterization in 2004. She had a recent follow-up ischemic evaluation, low risk Cardiolite in September. We plan to continue medical therapy and observation.  2. History of paroxysmal atrial fibrillation as well as previous stroke. Heart rate is regular today. She is on Coumadin, followed by Dr. Quillian Quince. If she elects to undergo epidural injection for pain control, Coumadin adjustments and discussion regarding bridging will be managed by Dr.  Quillian Quince.  3. Essential hypertension, no change in current regimen including atenolol and Cozaar.  Current medicines were reviewed with the patient today.  Disposition: FU with me in 6 months.   Signed, Satira Sark, MD, [facility] 07/25/2015 10:25 AM    [place] at [place], [place], [place] [address] Phone: [phone]; Fax: [phone]

## 2015-07-25 NOTE — Patient Instructions (Signed)
Continue all current medications. Your physician wants you to follow up in: 6 months.  You will receive a reminder letter in the mail one-two months in advance.  If you don't receive a letter, please call our office to schedule the follow up appointment   

## 2015-07-31 DIAGNOSIS — Z8673 Personal history of transient ischemic attack (TIA), and cerebral infarction without residual deficits: Secondary | ICD-10-CM | POA: Diagnosis not present

## 2015-07-31 DIAGNOSIS — N182 Chronic kidney disease, stage 2 (mild): Secondary | ICD-10-CM | POA: Diagnosis not present

## 2015-07-31 DIAGNOSIS — E1122 Type 2 diabetes mellitus with diabetic chronic kidney disease: Secondary | ICD-10-CM | POA: Diagnosis not present

## 2015-07-31 DIAGNOSIS — Z79899 Other long term (current) drug therapy: Secondary | ICD-10-CM | POA: Diagnosis not present

## 2015-07-31 DIAGNOSIS — Z7901 Long term (current) use of anticoagulants: Secondary | ICD-10-CM | POA: Diagnosis not present

## 2015-07-31 DIAGNOSIS — Z882 Allergy status to sulfonamides status: Secondary | ICD-10-CM | POA: Diagnosis not present

## 2015-07-31 DIAGNOSIS — R300 Dysuria: Secondary | ICD-10-CM | POA: Diagnosis not present

## 2015-07-31 DIAGNOSIS — M069 Rheumatoid arthritis, unspecified: Secondary | ICD-10-CM | POA: Diagnosis not present

## 2015-07-31 DIAGNOSIS — I4891 Unspecified atrial fibrillation: Secondary | ICD-10-CM | POA: Diagnosis not present

## 2015-07-31 DIAGNOSIS — I129 Hypertensive chronic kidney disease with stage 1 through stage 4 chronic kidney disease, or unspecified chronic kidney disease: Secondary | ICD-10-CM | POA: Diagnosis not present

## 2015-07-31 DIAGNOSIS — N183 Chronic kidney disease, stage 3 (moderate): Secondary | ICD-10-CM | POA: Diagnosis not present

## 2015-08-02 DIAGNOSIS — I482 Chronic atrial fibrillation: Secondary | ICD-10-CM | POA: Diagnosis not present

## 2015-08-11 DIAGNOSIS — N182 Chronic kidney disease, stage 2 (mild): Secondary | ICD-10-CM | POA: Diagnosis not present

## 2015-08-30 DIAGNOSIS — I482 Chronic atrial fibrillation: Secondary | ICD-10-CM | POA: Diagnosis not present

## 2015-09-19 ENCOUNTER — Telehealth (INDEPENDENT_AMBULATORY_CARE_PROVIDER_SITE_OTHER): Payer: Self-pay | Admitting: Internal Medicine

## 2015-09-19 ENCOUNTER — Encounter (INDEPENDENT_AMBULATORY_CARE_PROVIDER_SITE_OTHER): Payer: Self-pay | Admitting: Internal Medicine

## 2015-09-19 ENCOUNTER — Ambulatory Visit (INDEPENDENT_AMBULATORY_CARE_PROVIDER_SITE_OTHER): Payer: Medicare Other | Admitting: Internal Medicine

## 2015-09-19 VITALS — BP 116/68 | HR 64 | Temp 98.1°F | Resp 18 | Ht 66.5 in | Wt 167.1 lb

## 2015-09-19 DIAGNOSIS — L570 Actinic keratosis: Secondary | ICD-10-CM | POA: Diagnosis not present

## 2015-09-19 DIAGNOSIS — D485 Neoplasm of uncertain behavior of skin: Secondary | ICD-10-CM | POA: Diagnosis not present

## 2015-09-19 DIAGNOSIS — C44319 Basal cell carcinoma of skin of other parts of face: Secondary | ICD-10-CM | POA: Diagnosis not present

## 2015-09-19 DIAGNOSIS — Z85828 Personal history of other malignant neoplasm of skin: Secondary | ICD-10-CM | POA: Diagnosis not present

## 2015-09-19 DIAGNOSIS — D044 Carcinoma in situ of skin of scalp and neck: Secondary | ICD-10-CM | POA: Diagnosis not present

## 2015-09-19 DIAGNOSIS — K529 Noninfective gastroenteritis and colitis, unspecified: Secondary | ICD-10-CM | POA: Diagnosis not present

## 2015-09-19 MED ORDER — BENEFIBER DRINK MIX PO PACK: 4.0000 g | PACK | Freq: Every day | ORAL | Status: DC

## 2015-09-19 MED ORDER — DICYCLOMINE HCL 10 MG PO CAPS: 10.0000 mg | ORAL_CAPSULE | Freq: Every day | ORAL | Status: DC

## 2015-09-19 NOTE — Telephone Encounter (Signed)
Forwarded to receptionist.

## 2015-09-19 NOTE — Progress Notes (Signed)
Presenting complaint;  Diarrhea.  History of present illness:  Patient is 82 year old Caucasian female who is here for reevaluation of chronic diarrhea. She was last seen for this problem in November 2015 and again in December 2015. She was scheduled for colonoscopy but she decided to cancel the procedure. She states she has had diarrhea off and on for several years. But she has had diarrhea nonstop for 2 years. She is having anywhere from 3-4 stools per day. First stool usually is but centesis and subsequent stools are loose and watery. She also has lower abdominal cramps and urgency but denies nocturnal bowel movements or accidents. She denies melena or rectal bleeding. She did pass blood prior to her office visit in December 2015. She has lost 9 pounds in the last 13 months. She believes weight losses because of diminished appetite and when she does have an appetite she's afraid to 8 because of diarrhea. No recent history of antibiotic use. Last colonoscopy was in March 2010 at [facility] revealing multiple diverticula at sigmoid colon. She has tried Imodium OTC. She states at times she becomes constipated and other times it does not work. She has not taken this medication on schedule. She is very concerned about stopping warfarin because she tells me second CVA that she suffered in 2014 was when warfarin was stopped for surgery. She denies nausea vomiting or skin rash. Family history is negative for IBD but positive for CRC in mother and brother.   Current Medications: Outpatient Encounter Prescriptions as of 09/19/2015  Medication Sig  . atenolol (TENORMIN) 50 MG tablet Take 50 mg by mouth every evening.   Marland Kitchen atorvastatin (LIPITOR) 40 MG tablet Take 40 mg by mouth at bedtime.   . Biotin 1000 MCG tablet Take 1,000 mcg by mouth daily.  . Calcium Carbonate-Vitamin D (CALTRATE 600+D) 600-400 MG-UNIT per tablet Take 1 tablet by mouth daily.    . citalopram (CELEXA) 20 MG tablet Take 20 mg by mouth  daily.  Marland Kitchen diltiazem (CARDIZEM CD) 240 MG 24 hr capsule Take 240 mg by mouth daily.  . furosemide (LASIX) 20 MG tablet Take 1 tablet (20 mg total) by mouth 2 (two) times daily. (Patient taking differently: Take 20 mg by mouth daily. )  . losartan (COZAAR) 100 MG tablet Take 100 mg by mouth every evening.   . predniSONE (DELTASONE) 5 MG tablet Take 5 mg by mouth daily.  Marland Kitchen warfarin (COUMADIN) 4 MG tablet Take 2-4 mg by mouth daily. Alternate taking 4mg  every evening with 2mg  every evening   No facility-administered encounter medications on file as of 09/19/2015.   Past medical history:  Hypertension of over 20 years duration. Atrial fibrillation was diagnosed about 10 years ago she has been anticoagulated. Hyperlipidemia. 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 History of polymyalgia rheumatica. History of depression. Last colonoscopy was in March 2010 revealing sigmoid colon diverticulosis. Tonsillectomy at age 70. Appendectomy in 1951. BSO with hysterectomy for excessive bleeding in 1985. She has had bilateral cataract extraction. Surgery for left ankle fracture in 2012. 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  Family history:  Mother had colon carcinoma at age 88 and died of unrelated causes age 33. Brother was diagnosed with colon carcinoma at age 58 and died within 16 months.  Objective:  Blood pressure 116/68, pulse 64, temperature 98.1 F (36.7 C), temperature source Oral, resp.  rate 18, height 5' 6.5" (1.689 m), weight 167 lb 1.6 oz (75.796 kg). Patient is alert and in no acute distress. Conjunctiva is pink. Sclera is nonicteric Oropharyngeal mucosa is normal. No neck masses or thyromegaly noted. Cardiac exam with irregular rhythm normal S1 and S2. Faint systolic ejection murmur noted at L LSB. Lungs are clear to auscultation. Abdomen is full. Bowel sounds are normal. On  palpation abdomen is soft with mild tenderness at LLQ in hypogastric area without guarding. No organomegaly or masses. No LE edema or clubbing noted.  Labs/studies Results: Lab data from 06/06/2015  WBC 8.4, H&H 13.1 and 39.4 and platelet count 342K  Glucose 112, serum sodium 140, potassium 4.1, chloride 102, CO2 22, BUN 15 and creatinine 1.13.  Bilirubin 0.7, AP 77, AST 17, ALT 16 and albumin 4.2.  Serum calcium 9.4  TSH 1.750    Assessment:  #1. Chronic diarrhea. Patient gives history of diarrhea dating back to several years but it has been intermittent until 2 years ago. She does not have alarm symptoms. Suspect she has irritable bowel syndrome. Other diagnoses would be microscopic or collagenous colitis as well as an infection. #2. Patient is high risk for CRC. She is not interested in colonoscopy. Last exam was in March 2010.  Plan:  GI pathogen panel. Dicyclomine 10 mg by mouth every morning. Benefiber 4 g by mouth daily at bedtime. Stool diary as to consistency and frequency stools for next two weeks. If GI pathogen panel is negative and she does not respond to therapy would consider colonoscopy. Since patient is concerned about stopping warfarin she could be bridged with Enoxaparin. Office visit in 2 months.

## 2015-09-19 NOTE — Telephone Encounter (Signed)
Dr.Rehman states that he wants to see her in 2 months. He says that the schedule may change.

## 2015-09-19 NOTE — Patient Instructions (Signed)
Stool diary as to frequency and consistency of stools for the next 2 weeks. Can use Dulcolax or glycerin suppository for constipation as needed. Physician will call with results of stool test.

## 2015-09-19 NOTE — Telephone Encounter (Signed)
Dr. Laural Golden noted on the [name] [name]' encounter form that he wants to see her in two months but his first available appt is in May. [name] [name] would like to be seen before then. Should we call her to work her in for a sooner appointment? Please advise.   Pt's ph# [phone] Thank you.

## 2015-09-20 NOTE — Telephone Encounter (Signed)
Pt is scheduled on April 4th at 8:15am. Thank you.

## 2015-09-21 DIAGNOSIS — K529 Noninfective gastroenteritis and colitis, unspecified: Secondary | ICD-10-CM | POA: Diagnosis not present

## 2015-09-21 DIAGNOSIS — R197 Diarrhea, unspecified: Secondary | ICD-10-CM | POA: Diagnosis not present

## 2015-09-24 ENCOUNTER — Encounter (HOSPITAL_COMMUNITY): Payer: Self-pay | Admitting: *Deleted

## 2015-09-24 ENCOUNTER — Inpatient Hospital Stay (HOSPITAL_COMMUNITY)
Admission: EM | Admit: 2015-09-24 | Discharge: 2015-09-28 | DRG: 291 | Disposition: A | Discharge status: Home Health | Payer: Medicare Other | Attending: Internal Medicine | Admitting: Internal Medicine

## 2015-09-24 ENCOUNTER — Emergency Department (HOSPITAL_COMMUNITY): Payer: Medicare Other

## 2015-09-24 DIAGNOSIS — I5031 Acute diastolic (congestive) heart failure: Secondary | ICD-10-CM | POA: Diagnosis present

## 2015-09-24 DIAGNOSIS — E876 Hypokalemia: Secondary | ICD-10-CM | POA: Diagnosis not present

## 2015-09-24 DIAGNOSIS — I480 Paroxysmal atrial fibrillation: Secondary | ICD-10-CM | POA: Diagnosis not present

## 2015-09-24 DIAGNOSIS — R0902 Hypoxemia: Secondary | ICD-10-CM

## 2015-09-24 DIAGNOSIS — E785 Hyperlipidemia, unspecified: Secondary | ICD-10-CM | POA: Diagnosis present

## 2015-09-24 DIAGNOSIS — Z7901 Long term (current) use of anticoagulants: Secondary | ICD-10-CM

## 2015-09-24 DIAGNOSIS — Z8673 Personal history of transient ischemic attack (TIA), and cerebral infarction without residual deficits: Secondary | ICD-10-CM

## 2015-09-24 DIAGNOSIS — Z800 Family history of malignant neoplasm of digestive organs: Secondary | ICD-10-CM

## 2015-09-24 DIAGNOSIS — J9601 Acute respiratory failure with hypoxia: Secondary | ICD-10-CM | POA: Diagnosis present

## 2015-09-24 DIAGNOSIS — Z8249 Family history of ischemic heart disease and other diseases of the circulatory system: Secondary | ICD-10-CM

## 2015-09-24 DIAGNOSIS — I110 Hypertensive heart disease with heart failure: Principal | ICD-10-CM | POA: Diagnosis present

## 2015-09-24 DIAGNOSIS — J40 Bronchitis, not specified as acute or chronic: Secondary | ICD-10-CM | POA: Diagnosis present

## 2015-09-24 DIAGNOSIS — I509 Heart failure, unspecified: Secondary | ICD-10-CM | POA: Diagnosis not present

## 2015-09-24 DIAGNOSIS — R0602 Shortness of breath: Secondary | ICD-10-CM | POA: Diagnosis not present

## 2015-09-24 DIAGNOSIS — Z90710 Acquired absence of both cervix and uterus: Secondary | ICD-10-CM

## 2015-09-24 DIAGNOSIS — I350 Nonrheumatic aortic (valve) stenosis: Secondary | ICD-10-CM | POA: Diagnosis not present

## 2015-09-24 DIAGNOSIS — R0789 Other chest pain: Secondary | ICD-10-CM | POA: Diagnosis present

## 2015-09-24 DIAGNOSIS — I5033 Acute on chronic diastolic (congestive) heart failure: Secondary | ICD-10-CM | POA: Diagnosis present

## 2015-09-24 DIAGNOSIS — I10 Essential (primary) hypertension: Secondary | ICD-10-CM | POA: Diagnosis present

## 2015-09-24 DIAGNOSIS — M353 Polymyalgia rheumatica: Secondary | ICD-10-CM | POA: Diagnosis present

## 2015-09-24 DIAGNOSIS — R0682 Tachypnea, not elsewhere classified: Secondary | ICD-10-CM | POA: Diagnosis not present

## 2015-09-24 DIAGNOSIS — R05 Cough: Secondary | ICD-10-CM | POA: Diagnosis not present

## 2015-09-24 DIAGNOSIS — I482 Chronic atrial fibrillation, unspecified: Secondary | ICD-10-CM | POA: Diagnosis present

## 2015-09-24 DIAGNOSIS — Z8261 Family history of arthritis: Secondary | ICD-10-CM

## 2015-09-24 DIAGNOSIS — Z823 Family history of stroke: Secondary | ICD-10-CM

## 2015-09-24 DIAGNOSIS — I2510 Atherosclerotic heart disease of native coronary artery without angina pectoris: Secondary | ICD-10-CM | POA: Diagnosis present

## 2015-09-24 DIAGNOSIS — J209 Acute bronchitis, unspecified: Secondary | ICD-10-CM | POA: Insufficient documentation

## 2015-09-24 MED ORDER — NITROGLYCERIN 2 % TD OINT
1.0000 [in_us] | TOPICAL_OINTMENT | Freq: Once | TRANSDERMAL | Status: AC
  Administered 2015-09-24: 1 [in_us] via TOPICAL
  Filled 2015-09-24: qty 1

## 2015-09-24 NOTE — ED Provider Notes (Signed)
CSN: [id number]     Arrival date & time 09/24/15  2245 History  By signing my name below, I, [name] [name], attest that this documentation has been prepared under the direction and in the presence of Rolland Porter, MD at 2315. Electronically Signed: Eustaquio [name], ED Scribe. 09/25/2015. 12:09 AM.   Chief Complaint  Patient presents with  . Shortness of Breath   The history is provided by the patient. No language interpreter was used.     HPI Comments: [name] [name] is a 82 y.o. female brought in by ambulance, with hx essential hypertension, HLD, atrial fibrillation who presents to the Emergency Department complaining of gradual onset, constant, gradually improving, shortness of breath that began around 8 PM tonight (approximately 3 hours ago). Pt reports that she began having left sided chest pain and diaphoresis when the shortness of breath began. The pain lasted a few seconds before subsiding. Upon EMS arrival, pt's O2 sat was in the upper 80's. She was placed on CPAP and given 125 mg solumedrol along with an albuterol inhaler treatment. She reports that yesterday she began having a dry cough and wheezing. Her husband states that pt may have had a fever but they did not take her temperature. Pt mentions having similar symptoms with previous CHF. She states that she is being complaint with her Lasix medication.  Denies leg swelling, abdominal bloating, or any other associated symptoms. Pt is non smoker.   PCP - Dr Dub Amis in [facility] Cardiologist - Dr Domenic Polite   Past Medical History  Diagnosis Date  . Polymyalgia rheumatica ([place])   . Stroke ([place])   . Essential hypertension, benign   . Hyperlipidemia   . Paroxysmal atrial fibrillation ([facility])     On Coumadin  . Coronary atherosclerosis of native coronary artery     Nonobstructive at cardiac catheterization 2004  . Dysphagia    Past Surgical History  Procedure Laterality Date  . Total abdominal hysterectomy    . Appendectomy    . Left ankle     . Toe surgery    . Colonoscopy  2008    Dr.Fleshiman   Family History  Problem Relation Age of Onset  . Coronary artery disease Other   . Stroke Mother   . Hypertension Sister   . Hypertension Brother   . Rheum arthritis Daughter   . Healthy Son   . Healthy Son   . Healthy Son   . Hypertension Sister   . Heart disease Sister   . Heart disease Brother   . Hypertension Brother   . Arthritis Brother   . Colon cancer Brother   . Hypertension Brother    Social History  Substance Use Topics  . Smoking status: Never Smoker   . Smokeless tobacco: Never Used  . Alcohol Use: No   Lives at home Lives with spouse  OB History    No data available     Review of Systems  Respiratory: Positive for cough, shortness of breath and wheezing.   Cardiovascular: Positive for chest pain. Negative for leg swelling.  Gastrointestinal: Negative for abdominal distention.  All other systems reviewed and are negative.     Allergies  Sulfonamide derivatives  Home Medications   Prior to Admission medications   Medication Sig Start Date End Date Taking? Authorizing Provider  atenolol (TENORMIN) 50 MG tablet Take 50 mg by mouth every evening.     Historical Provider, MD  atorvastatin (LIPITOR) 40 MG tablet Take 40 mg by mouth  at bedtime.     Historical Provider, MD  Biotin 1000 MCG tablet Take 1,000 mcg by mouth daily.    Historical Provider, MD  Calcium Carbonate-Vitamin D (CALTRATE 600+D) 600-400 MG-UNIT per tablet Take 1 tablet by mouth daily.      Historical Provider, MD  citalopram (CELEXA) 20 MG tablet Take 20 mg by mouth daily.    Historical Provider, MD  dicyclomine (BENTYL) 10 MG capsule Take 1 capsule (10 mg total) by mouth daily before breakfast. 09/19/15   Rogene Houston, MD  diltiazem (CARDIZEM CD) 240 MG 24 hr capsule Take 240 mg by mouth daily.    Historical Provider, MD  furosemide (LASIX) 20 MG tablet Take 1 tablet (20 mg total) by mouth 2 (two) times daily. Patient  taking differently: Take 20 mg by mouth daily.  12/31/14   Orvan Falconer, MD  losartan (COZAAR) 100 MG tablet Take 100 mg by mouth every evening.     Historical Provider, MD  predniSONE (DELTASONE) 5 MG tablet Take 5 mg by mouth daily.    Historical Provider, MD  warfarin (COUMADIN) 4 MG tablet Take 2-4 mg by mouth daily. Alternate taking 4mg  every evening with 2mg  every evening    Historical Provider, MD  Wheat Dextrin (BENEFIBER DRINK MIX) PACK Take 4 g by mouth at bedtime. 09/19/15   Rogene Houston, MD   ED Triage Vitals  Enc Vitals Group     BP 09/24/15 2300 151/59 mmHg     Pulse Rate 09/24/15 2315 78     Resp 09/24/15 2300 24     Temp --      Temp src --      SpO2 09/24/15 2315 97 %     Weight --      Height --      Head Cir --      Peak Flow --      Pain Score 09/24/15 2249 5     Pain Loc --      Pain Edu? --      Excl. in [place]? --    Vital signs normal except for hypertension and tachypnea    Physical Exam  Constitutional: She is oriented to person, place, and time. She appears well-developed and well-nourished.  Non-toxic appearance. She does not appear ill. No distress.  HENT:  Head: Normocephalic and atraumatic.  Right Ear: External ear normal.  Left Ear: External ear normal.  Nose: Nose normal. No mucosal edema or rhinorrhea.  Mouth/Throat: Oropharynx is clear and moist and mucous membranes are normal. No dental abscesses or uvula swelling.  Eyes: Conjunctivae and EOM are normal. Pupils are equal, round, and reactive to light.  Neck: Normal range of motion and full passive range of motion without pain. Neck supple.  Cardiovascular: Normal rate, regular rhythm and normal heart sounds.  Exam reveals no gallop and no friction rub.   No murmur heard. Pulmonary/Chest: Breath sounds normal. Accessory muscle usage present. Tachypnea noted. No respiratory distress. She has no wheezes. She has no rhonchi. She has no rales. She exhibits no tenderness and no crepitus.    Area of  chest pain noted  Abdominal: Soft. Normal appearance and bowel sounds are normal. She exhibits no distension. There is no tenderness. There is no rebound and no guarding.  Musculoskeletal: Normal range of motion. She exhibits no edema or tenderness.  Moves all extremities well.   Neurological: She is alert and oriented to person, place, and time. She has normal strength. No cranial  nerve deficit.  Skin: Skin is warm, dry and intact. No rash noted. No erythema. No pallor.  Psychiatric: She has a normal mood and affect. Her speech is normal and behavior is normal. Her mood appears not anxious.  Nursing note and vitals reviewed.   ED Course  Procedures (including critical care time)  Medications  nitroGLYCERIN (NITROGLYN) 2 % ointment 1 inch (1 inch Topical Given 09/24/15 2339)  furosemide (LASIX) tablet 60 mg (60 mg Oral Given 09/25/15 0043)  potassium chloride SA (K-DUR,KLOR-CON) CR tablet 40 mEq (40 mEq Oral Given 09/25/15 0130)    DIAGNOSTIC STUDIES: Oxygen Saturation is 98% on [place], normal by my interpretation.    COORDINATION OF CARE: 11:23 PM-Discussed treatment plan which includes CXR, CMP, Troponin, BNP, and CBC with pt at bedside and pt agreed to plan.   12:35 AM - Upon reevaluation pt states she is feeling better. Pt still looks tachypniec to me. Reviewed x ray and CBC. Rest of labs still pending Gave IV lasix based on her chest x-ray.  Patient was given potassium for her hypokalemia.  Reviewing patient's test results and the fact that she was hypoxic on EMS arrival requiring C PAP I felt she should be admitted even for observation. Patient and her family are willing.  01:37 Dr Marin Comment will admit and do bed request.   Labs Review Results for orders placed or performed during the hospital encounter of 09/24/15  Comprehensive metabolic panel  Result Value Ref Range   Sodium 134 (L) 135 - 145 mmol/L   Potassium 3.2 (L) 3.5 - 5.1 mmol/L   Chloride 103 101 - 111 mmol/L   CO2 21 (L) 22 -  32 mmol/L   Glucose, Bld 135 (H) 65 - 99 mg/dL   BUN 15 6 - 20 mg/dL   Creatinine, Ser 0.84 0.44 - 1.00 mg/dL   Calcium 8.4 (L) 8.9 - 10.3 mg/dL   Total Protein 6.9 6.5 - 8.1 g/dL   Albumin 4.0 3.5 - 5.0 g/dL   AST 19 15 - 41 U/L   ALT 14 14 - 54 U/L   Alkaline Phosphatase 77 38 - 126 U/L   Total Bilirubin 1.0 0.3 - 1.2 mg/dL   GFR calc non Af Amer >60 >60 mL/min   GFR calc Af Amer >60 >60 mL/min   Anion gap 10 5 - 15  Troponin I  Result Value Ref Range   Troponin I <0.03 <0.031 ng/mL  Brain natriuretic peptide  Result Value Ref Range   B Natriuretic Peptide 519.0 (H) 0.0 - 100.0 pg/mL  CBC with Differential  Result Value Ref Range   WBC 12.0 (H) 4.0 - 10.5 K/uL   RBC 3.95 3.87 - 5.11 MIL/uL   Hemoglobin 12.2 12.0 - 15.0 g/dL   HCT 36.1 36.0 - 46.0 %   MCV 91.4 78.0 - 100.0 fL   MCH 30.9 26.0 - 34.0 pg   MCHC 33.8 30.0 - 36.0 g/dL   RDW 13.5 11.5 - 15.5 %   Platelets 258 150 - 400 K/uL   Neutrophils Relative % 86 %   Neutro Abs 10.4 (H) 1.7 - 7.7 K/uL   Lymphocytes Relative 10 %   Lymphs Abs 1.2 0.7 - 4.0 K/uL   Monocytes Relative 4 %   Monocytes Absolute 0.4 0.1 - 1.0 K/uL   Eosinophils Relative 0 %   Eosinophils Absolute 0.0 0.0 - 0.7 K/uL   Basophils Relative 0 %   Basophils Absolute 0.0 0.0 - 0.1 K/uL   Laboratory  interpretation all normal except mildly elevated BNP but higher than her baseline in the 300s, hypokalemia     Imaging Review Dg Chest Port 1 View  09/24/2015  CLINICAL DATA:  Acute onset of shortness of breath and cough. Initial encounter. EXAM: PORTABLE CHEST 1 VIEW COMPARISON:  Chest radiograph performed 06/23/2015 FINDINGS: A small left pleural effusion is suspected. Mild bibasilar opacities may reflect mild interstitial edema or possibly pneumonia. No pneumothorax is seen. The cardiomediastinal silhouette is borderline enlarged. No acute osseous abnormalities are identified. IMPRESSION: Small left pleural effusion suspected. Mild bibasilar opacities  may reflect mild interstitial edema or possibly pneumonia, given the patient's symptoms. Borderline cardiomegaly. Electronically Signed   By: Garald Balding M.D.   On: 09/24/2015 23:59   I have personally reviewed and evaluated these images and lab results as part of my medical decision-making.   EKG Interpretation   Date/Time:  Sunday September 24 2015 22:53:13 EST Ventricular Rate:  93 PR Interval:    QRS Duration: 134 QT Interval:  411 QTC Calculation: 511 R Axis:   -31 Text Interpretation:  Atrial fibrillation Left bundle branch block  artifact No significant change since last tracing 29 Dec 2014 Confirmed by  Baylen Dea  MD-I, Krystan Northrop ([id number]) on 09/24/2015 10:58:36 PM      MDM   Final diagnoses:  Acute congestive heart failure, unspecified congestive heart failure type ([place])  Hypoxia  Hypokalemia   Plan admission  Rolland Porter, MD, FACEP   I personally performed the services described in this documentation, which was scribed in my presence. The recorded information has been reviewed and considered.  Rolland Porter, MD, Barbette Or, MD 09/25/15 [phone]

## 2015-09-24 NOTE — ED Notes (Signed)
Pt c/o sob and cough that started this am when she woke up and this evening around 2000 she noticed the sob became worse; when ems arrived pt's O2 sats in the upper 80's; pt placed on cpap and given 125mg  solumedrol and albuterol neb treatment en route to hospital

## 2015-09-25 ENCOUNTER — Inpatient Hospital Stay (HOSPITAL_COMMUNITY): Payer: Medicare Other

## 2015-09-25 ENCOUNTER — Encounter (HOSPITAL_COMMUNITY): Payer: Self-pay | Admitting: Internal Medicine

## 2015-09-25 DIAGNOSIS — Z823 Family history of stroke: Secondary | ICD-10-CM | POA: Diagnosis not present

## 2015-09-25 DIAGNOSIS — R0602 Shortness of breath: Secondary | ICD-10-CM | POA: Diagnosis not present

## 2015-09-25 DIAGNOSIS — J9621 Acute and chronic respiratory failure with hypoxia: Secondary | ICD-10-CM | POA: Diagnosis not present

## 2015-09-25 DIAGNOSIS — I480 Paroxysmal atrial fibrillation: Secondary | ICD-10-CM | POA: Diagnosis present

## 2015-09-25 DIAGNOSIS — J9601 Acute respiratory failure with hypoxia: Secondary | ICD-10-CM | POA: Diagnosis present

## 2015-09-25 DIAGNOSIS — I5033 Acute on chronic diastolic (congestive) heart failure: Secondary | ICD-10-CM | POA: Diagnosis not present

## 2015-09-25 DIAGNOSIS — I482 Chronic atrial fibrillation: Secondary | ICD-10-CM

## 2015-09-25 DIAGNOSIS — I509 Heart failure, unspecified: Secondary | ICD-10-CM | POA: Diagnosis not present

## 2015-09-25 DIAGNOSIS — I5031 Acute diastolic (congestive) heart failure: Secondary | ICD-10-CM

## 2015-09-25 DIAGNOSIS — Z7901 Long term (current) use of anticoagulants: Secondary | ICD-10-CM

## 2015-09-25 DIAGNOSIS — R05 Cough: Secondary | ICD-10-CM | POA: Diagnosis not present

## 2015-09-25 DIAGNOSIS — Z800 Family history of malignant neoplasm of digestive organs: Secondary | ICD-10-CM | POA: Diagnosis not present

## 2015-09-25 DIAGNOSIS — M353 Polymyalgia rheumatica: Secondary | ICD-10-CM | POA: Diagnosis present

## 2015-09-25 DIAGNOSIS — R0789 Other chest pain: Secondary | ICD-10-CM

## 2015-09-25 DIAGNOSIS — I110 Hypertensive heart disease with heart failure: Secondary | ICD-10-CM | POA: Diagnosis present

## 2015-09-25 DIAGNOSIS — Z8673 Personal history of transient ischemic attack (TIA), and cerebral infarction without residual deficits: Secondary | ICD-10-CM | POA: Diagnosis not present

## 2015-09-25 DIAGNOSIS — I10 Essential (primary) hypertension: Secondary | ICD-10-CM | POA: Diagnosis not present

## 2015-09-25 DIAGNOSIS — Z90710 Acquired absence of both cervix and uterus: Secondary | ICD-10-CM | POA: Diagnosis not present

## 2015-09-25 DIAGNOSIS — I350 Nonrheumatic aortic (valve) stenosis: Secondary | ICD-10-CM | POA: Diagnosis present

## 2015-09-25 DIAGNOSIS — I2510 Atherosclerotic heart disease of native coronary artery without angina pectoris: Secondary | ICD-10-CM | POA: Diagnosis present

## 2015-09-25 DIAGNOSIS — Z8261 Family history of arthritis: Secondary | ICD-10-CM | POA: Diagnosis not present

## 2015-09-25 DIAGNOSIS — J40 Bronchitis, not specified as acute or chronic: Secondary | ICD-10-CM | POA: Diagnosis present

## 2015-09-25 DIAGNOSIS — E876 Hypokalemia: Secondary | ICD-10-CM | POA: Diagnosis not present

## 2015-09-25 DIAGNOSIS — E785 Hyperlipidemia, unspecified: Secondary | ICD-10-CM | POA: Diagnosis present

## 2015-09-25 DIAGNOSIS — Z8249 Family history of ischemic heart disease and other diseases of the circulatory system: Secondary | ICD-10-CM | POA: Diagnosis not present

## 2015-09-25 LAB — CBC WITH DIFFERENTIAL/PLATELET
BASOS PCT: 0 %
Basophils Absolute: 0 10*3/uL (ref 0.0–0.1)
Eosinophils Absolute: 0 10*3/uL (ref 0.0–0.7)
Eosinophils Relative: 0 %
HCT: 36.1 % (ref 36.0–46.0)
Hemoglobin: 12.2 g/dL (ref 12.0–15.0)
Lymphocytes Relative: 10 %
Lymphs Abs: 1.2 10*3/uL (ref 0.7–4.0)
MCH: 30.9 pg (ref 26.0–34.0)
MCHC: 33.8 g/dL (ref 30.0–36.0)
MCV: 91.4 fL (ref 78.0–100.0)
Monocytes Absolute: 0.4 10*3/uL (ref 0.1–1.0)
Monocytes Relative: 4 %
NEUTROS PCT: 86 %
Neutro Abs: 10.4 10*3/uL — ABNORMAL HIGH (ref 1.7–7.7)
Platelets: 258 10*3/uL (ref 150–400)
RBC: 3.95 MIL/uL (ref 3.87–5.11)
RDW: 13.5 % (ref 11.5–15.5)
WBC: 12 10*3/uL — ABNORMAL HIGH (ref 4.0–10.5)

## 2015-09-25 LAB — GASTROINTESTINAL PATHOGEN PANEL PCR
C. difficile Tox A/B, PCR: NEGATIVE
CRYPTOSPORIDIUM, PCR: NEGATIVE
Campylobacter, PCR: NEGATIVE
E coli (ETEC) LT/ST PCR: NEGATIVE
E coli (STEC) stx1/stx2, PCR: NEGATIVE
E coli 0157, PCR: NEGATIVE
Giardia lamblia, PCR: NEGATIVE
NOROVIRUS, PCR: NEGATIVE
Rotavirus A, PCR: NEGATIVE
SALMONELLA, PCR: NEGATIVE
Shigella, PCR: NEGATIVE

## 2015-09-25 LAB — BRAIN NATRIURETIC PEPTIDE: B Natriuretic Peptide: 519 pg/mL — ABNORMAL HIGH (ref 0.0–100.0)

## 2015-09-25 LAB — COMPREHENSIVE METABOLIC PANEL
ALT: 14 U/L (ref 14–54)
AST: 19 U/L (ref 15–41)
Albumin: 4 g/dL (ref 3.5–5.0)
Alkaline Phosphatase: 77 U/L (ref 38–126)
Anion gap: 10 (ref 5–15)
BUN: 15 mg/dL (ref 6–20)
CHLORIDE: 103 mmol/L (ref 101–111)
CO2: 21 mmol/L — ABNORMAL LOW (ref 22–32)
Calcium: 8.4 mg/dL — ABNORMAL LOW (ref 8.9–10.3)
Creatinine, Ser: 0.84 mg/dL (ref 0.44–1.00)
Glucose, Bld: 135 mg/dL — ABNORMAL HIGH (ref 65–99)
POTASSIUM: 3.2 mmol/L — AB (ref 3.5–5.1)
Sodium: 134 mmol/L — ABNORMAL LOW (ref 135–145)
Total Bilirubin: 1 mg/dL (ref 0.3–1.2)
Total Protein: 6.9 g/dL (ref 6.5–8.1)

## 2015-09-25 LAB — TROPONIN I

## 2015-09-25 LAB — PROTIME-INR
INR: 2.03 — AB (ref 0.00–1.49)
PROTHROMBIN TIME: 22.8 s — AB (ref 11.6–15.2)

## 2015-09-25 LAB — TSH: TSH: 0.548 u[IU]/mL (ref 0.350–4.500)

## 2015-09-25 MED ORDER — FUROSEMIDE 10 MG/ML IJ SOLN
40.0000 mg | Freq: Two times a day (BID) | INTRAMUSCULAR | Status: DC
  Administered 2015-09-25 – 2015-09-26 (×3): 40 mg via INTRAVENOUS
  Filled 2015-09-25 (×3): qty 4

## 2015-09-25 MED ORDER — BENEFIBER DRINK MIX PO PACK: 4.0000 g | PACK | Freq: Every day | ORAL | Status: DC

## 2015-09-25 MED ORDER — NITROGLYCERIN 2 % TD OINT
1.0000 [in_us] | TOPICAL_OINTMENT | Freq: Three times a day (TID) | TRANSDERMAL | Status: DC
  Administered 2015-09-25 – 2015-09-28 (×10): 1 [in_us] via TOPICAL
  Filled 2015-09-25 (×9): qty 1

## 2015-09-25 MED ORDER — PSYLLIUM 95 % PO PACK
1.0000 | PACK | Freq: Every day | ORAL | Status: DC
  Administered 2015-09-26: 1 via ORAL
  Filled 2015-09-25 (×4): qty 1

## 2015-09-25 MED ORDER — ATENOLOL 25 MG PO TABS
50.0000 mg | ORAL_TABLET | Freq: Every evening | ORAL | Status: DC
  Administered 2015-09-25 – 2015-09-27 (×3): 50 mg via ORAL
  Filled 2015-09-25 (×3): qty 2

## 2015-09-25 MED ORDER — SODIUM CHLORIDE 0.9% FLUSH
3.0000 mL | Freq: Two times a day (BID) | INTRAVENOUS | Status: DC
  Administered 2015-09-25 – 2015-09-28 (×7): 3 mL via INTRAVENOUS

## 2015-09-25 MED ORDER — CALCIUM CARBONATE-VITAMIN D 600-400 MG-UNIT PO TABS: 1.0000 | ORAL_TABLET | Freq: Every day | ORAL | Status: DC

## 2015-09-25 MED ORDER — CALCIUM CARBONATE-VITAMIN D 500-200 MG-UNIT PO TABS
2.0000 | ORAL_TABLET | Freq: Every day | ORAL | Status: DC
  Administered 2015-09-25 – 2015-09-28 (×4): 2 via ORAL
  Filled 2015-09-25 (×2): qty 2
  Filled 2015-09-25: qty 1
  Filled 2015-09-25: qty 2
  Filled 2015-09-25: qty 1

## 2015-09-25 MED ORDER — POTASSIUM CHLORIDE CRYS ER 20 MEQ PO TBCR
40.0000 meq | EXTENDED_RELEASE_TABLET | Freq: Once | ORAL | Status: AC
  Administered 2015-09-25: 40 meq via ORAL
  Filled 2015-09-25: qty 2

## 2015-09-25 MED ORDER — CITALOPRAM HYDROBROMIDE 20 MG PO TABS
20.0000 mg | ORAL_TABLET | Freq: Every day | ORAL | Status: DC
  Administered 2015-09-25 – 2015-09-28 (×4): 20 mg via ORAL
  Filled 2015-09-25 (×4): qty 1

## 2015-09-25 MED ORDER — ATORVASTATIN CALCIUM 40 MG PO TABS
40.0000 mg | ORAL_TABLET | Freq: Every day | ORAL | Status: DC
  Administered 2015-09-25 – 2015-09-27 (×3): 40 mg via ORAL
  Filled 2015-09-25 (×3): qty 1

## 2015-09-25 MED ORDER — FUROSEMIDE 40 MG PO TABS
60.0000 mg | ORAL_TABLET | Freq: Once | ORAL | Status: AC
  Administered 2015-09-25: 60 mg via ORAL
  Filled 2015-09-25: qty 2

## 2015-09-25 MED ORDER — DILTIAZEM HCL ER COATED BEADS 240 MG PO CP24
240.0000 mg | ORAL_CAPSULE | Freq: Every day | ORAL | Status: DC
  Administered 2015-09-25 – 2015-09-28 (×4): 240 mg via ORAL
  Filled 2015-09-25 (×4): qty 1

## 2015-09-25 MED ORDER — LOSARTAN POTASSIUM 50 MG PO TABS
100.0000 mg | ORAL_TABLET | Freq: Every evening | ORAL | Status: DC
  Administered 2015-09-25 – 2015-09-27 (×3): 100 mg via ORAL
  Filled 2015-09-25 (×3): qty 2

## 2015-09-25 MED ORDER — BIOTIN 1000 MCG PO TABS: 1000.0000 ug | ORAL_TABLET | Freq: Every day | ORAL | Status: DC

## 2015-09-25 NOTE — Progress Notes (Signed)
ANTICOAGULATION CONSULT NOTE - Initial Consult  Pharmacy Consult for coumadin Indication: atrial fibrillation  Allergies  Allergen Reactions  . Sulfonamide Derivatives Rash    Patient Measurements: Height: 5' 6.5" (168.9 cm) Weight: 167 lb 6.4 oz (75.932 kg) IBW/kg (Calculated) : 60.45   Vital Signs: Temp: 98 F (36.7 C) (02/06 0350) Temp Source: Oral (02/06 0350) BP: 134/72 mmHg (02/06 0350) Pulse Rate: 94 (02/06 0350)  Labs:  Recent Labs  09/24/15 2350 09/25/15 0423 09/25/15 0923  HGB 12.2  --   --   HCT 36.1  --   --   PLT 258  --   --   LABPROT  --   --  22.8*  INR  --   --  2.03*  CREATININE 0.84  --   --   TROPONINI <0.03 <0.03 <0.03    Estimated Creatinine Clearance: 53.4 mL/min (by C-G formula based on Cr of 0.84).   Medical History: Past Medical History  Diagnosis Date  . Polymyalgia rheumatica ([place])   . Stroke ([place])   . Essential hypertension, benign   . Hyperlipidemia   . Paroxysmal atrial fibrillation ([facility])     On Coumadin  . Coronary atherosclerosis of native coronary artery     Nonobstructive at cardiac catheterization 2004  . Dysphagia     Medications:  Prescriptions prior to admission  Medication Sig Dispense Refill Last Dose  . atenolol (TENORMIN) 50 MG tablet Take 50 mg by mouth every evening.    Taking  . atorvastatin (LIPITOR) 40 MG tablet Take 40 mg by mouth at bedtime.    Taking  . Biotin 1000 MCG tablet Take 1,000 mcg by mouth daily.   Taking  . Calcium Carbonate-Vitamin D (CALTRATE 600+D) 600-400 MG-UNIT per tablet Take 1 tablet by mouth daily.     Taking  . citalopram (CELEXA) 20 MG tablet Take 20 mg by mouth daily.   Taking  . dicyclomine (BENTYL) 10 MG capsule Take 1 capsule (10 mg total) by mouth daily before breakfast. 30 capsule 3   . diltiazem (CARDIZEM CD) 240 MG 24 hr capsule Take 240 mg by mouth daily.   Taking  . furosemide (LASIX) 20 MG tablet Take 1 tablet (20 mg total) by mouth 2 (two) times daily. (Patient taking  differently: Take 20 mg by mouth daily. ) 30 tablet 1 Taking  . losartan (COZAAR) 100 MG tablet Take 100 mg by mouth every evening.    Taking  . predniSONE (DELTASONE) 5 MG tablet Take 5 mg by mouth daily.   Taking  . warfarin (COUMADIN) 4 MG tablet Take 2-4 mg by mouth daily. Alternate taking 4mg  every evening with 2mg  every evening   Taking  . Wheat Dextrin (BENEFIBER DRINK MIX) PACK Take 4 g by mouth at bedtime.       Assessment: 82 yo lady to continue coumadin for afib.  INR today is 2.03 Goal of Therapy:  INR 2-3 Monitor platelets by anticoagulation protocol: Yes   Plan:  Coumadin 4 mg po today. Check daily PT/INR Monitor for bleeding complications  Thanks for allowing pharmacy to be a part of this patient's care.  Excell Seltzer, PharmD Clinical Pharmacist 09/25/2015,12:03 PM

## 2015-09-25 NOTE — Progress Notes (Signed)
[facility] PROGRESS NOTE  [name] [name] [id number] DOB: 07/27/1930 DOA: 09/24/2015 PCP: [name] Ponto, MD  Assessment/Plan: 1. Acute hypoxemic respiratory failure -Evidenced by O2 sats in the 80s requiring noninvasive positive pressure ventilation -Chest x-ray showing bibasilar opacities consistent with interstitial edema -Secondary to acute diastolic congestive heart failure, she was started on IV Lasix at 40 mg twice a day -On a.m. evaluation patient showing improvement she was titrated off of BiPAP therapy overnight  2.  Acute on chronic diastolic congestive heart failure -Her last transthoracic echocardiogram was performed on 12/30/2014 that showed a preserved EF of 60-65% -She presents with clinical signs symptoms consistent with acute CHF, reporting 3-4 pound waking over the past several weeks. At home she had been taking Lasix 20 mg by mouth daily. He was started on Lasix 40 mg IV twice a day during this hospitalization. Overnight she had a urinary output of 875 mL -Showing clinical improvement, although continues to have crackles on lung exam. Plan continue IV diuresis, follow kidney function. -Cardiology consulted per patient's request  3.  History of chronic atrial fibrillation, CHADSVasc score of 4 -Ventricular rates in the 80s to 90s -Plan to continue diltiazem 240 mg by mouth daily and atenolol 50 mg by mouth daily -Pharmacy consulted for warfarin management. A.m. lab work showing therapeutic INR of 2.03 with PT of 22.8.  4.  Hypertension. -Blood pressures are controlled, continue diltiazem 240 mg by mouth daily, atenolol 50 mg by mouth daily at bedtime, losartan 100 mg by mouth daily, IV Lasix and nitroglycerin  5.  Dyslipidemia -Continue Lipitor 40 mg by mouth daily  Code Status: Full code Family Communication: Spoke with her daughter was present at bedside Disposition Plan: Continue IV diuresis   Consultants:  Cardiology   HPI/Subjective: [name] [name]  is a pleasant 82 year old female with a past medical history of diastolic congestive heart failure, last transthoracic echocardiogram performed 12/30/2014 that showed preserved EF of 60-65%. She also has a history of atrial fibrillation on chronic anticoagulation with warfarin admitted to the medicine service on 09/25/2015 when she presented with complaints of worsening shortness of breath. She reported symptoms progressively worsening over the past week 4 hours. This was a session with a 3-4 pound waking over the past 2 weeks. She was hypoxemic on admission with O2 sats of 80's required BiPAP there be. Workup included a chest x-ray that showed bibasilar opacities consistent with interstitial edema. Symptoms likely secondary to acute decompensated congestive heart failure as she was started on IV Lasix 40 mg twice a day.  Objective: Filed Vitals:   09/25/15 0328 09/25/15 0350  BP:  134/72  Pulse:  94  Temp: 99.8 F (37.7 C) 98 F (36.7 C)  Resp:  17    Intake/Output Summary (Last 24 hours) at 09/25/15 1115 Last data filed at 09/25/15 0901  Gross per 24 hour  Intake    480 ml  Output    875 ml  Net   -395 ml   Filed Weights   09/25/15 0347  Weight: 75.932 kg (167 lb 6.4 oz)    Exam:   General:  Patient is awake and alert, having mild dyspnea at rest  Cardiovascular: Irregular rate and rhythm normal S1-S2  Respiratory: There are fine crackles at bases otherwise good air movement no wheezing or rales  Abdomen: Soft nontender nondistended  Musculoskeletal: No edema  Data Reviewed: Basic Metabolic Panel:  Recent Labs Lab 09/24/15 2350  NA 134*  K 3.2*  CL 103  CO2  21*  GLUCOSE 135*  BUN 15  CREATININE 0.84  CALCIUM 8.4*   Liver Function Tests:  Recent Labs Lab 09/24/15 2350  AST 19  ALT 14  ALKPHOS 77  BILITOT 1.0  PROT 6.9  ALBUMIN 4.0   No results for input(s): LIPASE, AMYLASE in the last 168 hours. No results for input(s): AMMONIA in the last 168  hours. CBC:  Recent Labs Lab 09/24/15 2350  WBC 12.0*  NEUTROABS 10.4*  HGB 12.2  HCT 36.1  MCV 91.4  PLT 258   Cardiac Enzymes:  Recent Labs Lab 09/24/15 2350 09/25/15 0423 09/25/15 0923  TROPONINI <0.03 <0.03 <0.03   BNP (last 3 results)  Recent Labs  12/29/14 1452 12/30/14 0544 09/24/15 2350  BNP 396.0* 228.0* 519.0*    ProBNP (last 3 results) No results for input(s): PROBNP in the last 8760 hours.  CBG: No results for input(s): GLUCAP in the last 168 hours.  No results found for this or any previous visit (from the past 240 hour(s)).   Studies: Dg Chest Port 1 View  09/24/2015  CLINICAL DATA:  Acute onset of shortness of breath and cough. Initial encounter. EXAM: PORTABLE CHEST 1 VIEW COMPARISON:  Chest radiograph performed 06/23/2015 FINDINGS: A small left pleural effusion is suspected. Mild bibasilar opacities may reflect mild interstitial edema or possibly pneumonia. No pneumothorax is seen. The cardiomediastinal silhouette is borderline enlarged. No acute osseous abnormalities are identified. IMPRESSION: Small left pleural effusion suspected. Mild bibasilar opacities may reflect mild interstitial edema or possibly pneumonia, given the patient's symptoms. Borderline cardiomegaly. Electronically Signed   By: [name] [name] M.D.   On: 09/24/2015 23:59    Scheduled Meds: . atenolol  50 mg Oral QPM  . atorvastatin  40 mg Oral QHS  . calcium-vitamin D  2 tablet Oral Q breakfast  . citalopram  20 mg Oral Daily  . diltiazem  240 mg Oral Daily  . furosemide  40 mg Intravenous BID  . losartan  100 mg Oral QPM  . nitroGLYCERIN  1 inch Topical 3 times per day  . psyllium  1 packet Oral QHS  . sodium chloride flush  3 mL Intravenous Q12H   Continuous Infusions:   Principal Problem:   Acute diastolic congestive heart failure (HCC) Active Problems:   Essential hypertension, benign   Chronic atrial fibrillation (HCC)   Long term (current) use of anticoagulants    Acute diastolic CHF (congestive heart failure) ([place])   Atypical chest pain    Time spent:     [name] [name]  [facility] Pager [phone]. If 7PM-7AM, please contact night-coverage at [email or web address], password [facility] 09/25/2015, 11:15 AM  LOS: 0 days

## 2015-09-25 NOTE — Consult Note (Signed)
CARDIOLOGY CONSULT NOTE  Patient ID: [name] [name] MRN: [id number] DOB/AGE: May 22, 1930 82 y.o.  Admit date: 09/24/2015 Primary Physician Gar Ponto, MD Consulting physician: Coralyn Pear  Reason for Consultation: acute diastolic heart failure  HPI: The patient is an 82 yr old woman followed by Dr. Domenic Polite and most recently evaluated by him in 07/2015. She has a history of nonobstructive CAD with low risk nuclear stress test in 04/2015, hypertension, and paroxysmal atrial fibrillation.  She was admitted early this morning with increasing shortness of breath, chest pain, acute hypoxic respiratory failure requiring bipap, and deemed to have acute diastolic heart failure.  Troponins have been normal. BNP mildly elevated at 519. Takes Lasix 20 mg daily at home. IV Lasix 40 mg bid ordered with 1.35 L output thus far.  Chest xray showed small left pleural effusion and mild bibasilar opacities reflective of mild edema vs pneumonia.  ECG shows atrial fibrillation with a LBBB.  Echocardiogram on 12/30/14 showed normal LV systolic function, EF 123456, and diastolic function could not be accurately assessed due to atrial fibrillation.  She primarily complains of a dry cough at present. Denies further chest pain. Feels as though her shortness of breath is unchanged.    Allergies  Allergen Reactions  . Sulfonamide Derivatives Rash    Current Facility-Administered Medications  Medication Dose Route Frequency Provider Last Rate Last Dose  . atenolol (TENORMIN) tablet 50 mg  50 mg Oral QPM Orvan Falconer, MD      . atorvastatin (LIPITOR) tablet 40 mg  40 mg Oral QHS Orvan Falconer, MD      . calcium-vitamin D (OSCAL WITH D) 500-200 MG-UNIT per tablet 2 tablet  2 tablet Oral Q breakfast Orvan Falconer, MD   2 tablet at 09/25/15 0848  . citalopram (CELEXA) tablet 20 mg  20 mg Oral Daily Orvan Falconer, MD   20 mg at 09/25/15 0956  . diltiazem (CARDIZEM CD) 24 hr capsule 240 mg  240 mg Oral Daily Orvan Falconer, MD   240  mg at 09/25/15 0956  . furosemide (LASIX) injection 40 mg  40 mg Intravenous BID Orvan Falconer, MD   40 mg at 09/25/15 0848  . losartan (COZAAR) tablet 100 mg  100 mg Oral QPM Orvan Falconer, MD      . nitroGLYCERIN (NITROGLYN) 2 % ointment 1 inch  1 inch Topical 3 times per day Orvan Falconer, MD   1 inch at 09/25/15 1429  . psyllium (HYDROCIL/METAMUCIL) packet 1 packet  1 packet Oral QHS Kelvin Cellar, MD      . sodium chloride flush (NS) 0.9 % injection 3 mL  3 mL Intravenous Q12H Orvan Falconer, MD   3 mL at 09/25/15 0957    Past Medical History  Diagnosis Date  . Polymyalgia rheumatica ([place])   . Stroke ([place])   . Essential hypertension, benign   . Hyperlipidemia   . Paroxysmal atrial fibrillation (HCC)     On Coumadin  . Coronary atherosclerosis of native coronary artery     Nonobstructive at cardiac catheterization 2004  . Dysphagia     Past Surgical History  Procedure Laterality Date  . Total abdominal hysterectomy    . Appendectomy    . Left ankle    . Toe surgery    . Colonoscopy  2008    Dr.Fleshiman    Social History   Social History  . Marital Status: Married    Spouse Name: Gwyndolyn Saxon  . Number of Children: 4  .  Years of Education: 14   Occupational History  . Retired    Social History Main Topics  . Smoking status: Never Smoker   . Smokeless tobacco: Never Used  . Alcohol Use: No  . Drug Use: No  . Sexual Activity: No   Other Topics Concern  . Not on file   Social History Narrative   Patient lives at home with spouse.   Caffeine Use: 2 cups daily     No family history of premature CAD in 1st degree relatives.  Prior to Admission medications   Medication Sig Start Date End Date Taking? Authorizing Provider  acetaminophen (TYLENOL) 500 MG tablet Take 1,000 mg by mouth every 6 (six) hours as needed.   Yes Historical Provider, MD  atenolol (TENORMIN) 50 MG tablet Take 50 mg by mouth every evening.    Yes Historical Provider, MD  atorvastatin (LIPITOR) 40 MG tablet  Take 40 mg by mouth at bedtime.    Yes Historical Provider, MD  Biotin 1000 MCG tablet Take 1,000 mcg by mouth daily.   Yes Historical Provider, MD  citalopram (CELEXA) 20 MG tablet Take 20 mg by mouth daily.   Yes Historical Provider, MD  dicyclomine (BENTYL) 10 MG capsule Take 1 capsule (10 mg total) by mouth daily before breakfast. 09/19/15  Yes Rogene Houston, MD  diltiazem (CARDIZEM CD) 240 MG 24 hr capsule Take 240 mg by mouth daily.   Yes Historical Provider, MD  furosemide (LASIX) 20 MG tablet Take 1 tablet (20 mg total) by mouth 2 (two) times daily. Patient taking differently: Take 20 mg by mouth daily.  12/31/14  Yes Orvan Falconer, MD  losartan (COZAAR) 100 MG tablet Take 100 mg by mouth every evening.    Yes Historical Provider, MD  predniSONE (DELTASONE) 5 MG tablet Take 5 mg by mouth daily.   Yes Historical Provider, MD  warfarin (COUMADIN) 4 MG tablet Take 2-4 mg by mouth daily. Alternate taking 4mg  every evening with 2mg  every evening   Yes Historical Provider, MD  Wheat Dextrin (BENEFIBER DRINK MIX) PACK Take 4 g by mouth at bedtime. 09/19/15  Yes Rogene Houston, MD  Calcium Carbonate-Vitamin D (CALTRATE 600+D) 600-400 MG-UNIT per tablet Take 1 tablet by mouth daily.      Historical Provider, MD     Review of systems complete and found to be negative unless listed above in HPI     Physical exam Blood pressure 152/56, pulse 75, temperature 98 F (36.7 C), temperature source Oral, resp. rate 18, height 5' 6.5" (1.689 m), weight 167 lb 6.4 oz (75.932 kg), SpO2 96 %. General: NAD Neck: No JVD, no thyromegaly or thyroid nodule.  Lungs: Crackles at left base, faint, intermittent, end-expiratory wheezes CV: Nondisplaced PMI. Regular rate and irregular rhythm, normal S1/S2, no S3, 2/6 systolic murmur heard throughout precordium.  No peripheral edema.   Abdomen: Soft, nontender, no distention.  Skin: Intact without lesions or rashes.  Neurologic: Alert and oriented x 3.  Psych: Normal  affect. Extremities: No clubbing or cyanosis.  HEENT: Normal.   ECG: Most recent ECG reviewed.  Labs:   Lab Results  Component Value Date   WBC 12.0* 09/24/2015   HGB 12.2 09/24/2015   HCT 36.1 09/24/2015   MCV 91.4 09/24/2015   PLT 258 09/24/2015    Recent Labs Lab 09/24/15 2350  NA 134*  K 3.2*  CL 103  CO2 21*  BUN 15  CREATININE 0.84  CALCIUM 8.4*  PROT 6.9  BILITOT 1.0  ALKPHOS 77  ALT 14  AST 19  GLUCOSE 135*   Lab Results  Component Value Date   CKTOTAL 62 12/23/2011   CKMB 1.3 12/23/2011   TROPONINI <0.03 09/25/2015    Lab Results  Component Value Date   CHOL 190 12/24/2011   Lab Results  Component Value Date   HDL 36* 12/24/2011   Lab Results  Component Value Date   LDLCALC 126* 12/24/2011   Lab Results  Component Value Date   TRIG 139 12/24/2011   Lab Results  Component Value Date   CHOLHDL 5.3 12/24/2011   No results found for: LDLDIRECT       Studies: Dg Chest Port 1 View  09/24/2015  CLINICAL DATA:  Acute onset of shortness of breath and cough. Initial encounter. EXAM: PORTABLE CHEST 1 VIEW COMPARISON:  Chest radiograph performed 06/23/2015 FINDINGS: A small left pleural effusion is suspected. Mild bibasilar opacities may reflect mild interstitial edema or possibly pneumonia. No pneumothorax is seen. The cardiomediastinal silhouette is borderline enlarged. No acute osseous abnormalities are identified. IMPRESSION: Small left pleural effusion suspected. Mild bibasilar opacities may reflect mild interstitial edema or possibly pneumonia, given the patient's symptoms. Borderline cardiomegaly. Electronically Signed   By: Garald Balding M.D.   On: 09/24/2015 23:59    ASSESSMENT AND PLAN:  1. Acute diastolic heart failure: Adequate diuresis with current diuretic regimen. Continue for now.  2. Atrial fibrillation: HR controlled on atenolol and diltiazem. Anticoagulated with warfarin with pharmacy consulted.  3. Chest pain: Troponins  normal. In light of normal nuclear stress test in 04/2015, no further workup is indicated. LV systolic function is normal.  4. Essential HTN: Mildly elevated on losartan 100 mg, atenolol 50 mg, and long-acting diltiazem 240 mg. Would monitor given ongoing diuresis without adjustments for now.  5. Aortic stenosis: At least mild, if not mild to moderate with norma LV systolic function. Will monitor clinically and with surveillance echocardiography.   Signed: Kate Sable, M.D., F.A.C.C.  09/25/2015, 3:52 PM

## 2015-09-25 NOTE — Progress Notes (Signed)
*  PRELIMINARY RESULTS* Echocardiogram 2D Echocardiogram has been performed.  Leavy Cella 09/25/2015, 4:07 PM

## 2015-09-25 NOTE — H&P (Signed)
[facility] History and Physical  [name] [name] [id number] DOB: 1930-01-19    PCP:   Gar Ponto, MD   Chief Complaint: SOB  HPI: [name] [name] is an 82 y.o. female with a hx of HTN, HLD, diastolic CHF, and atrial fibrillation presents with SOB that began this morning. Patient reports worsening SOB, a cough, wheezing, diaphoresis, and left sided chest pain. On EMS arrival, her initial O2 sat was in the 80s so she was placed on BIPAP with some improvement. She denies any fever, chills, nausea, vomiting, palpitations, or HA. While in the ED, patient was tachycardic and tachypneic. She is currently stable on low flow [place]. Labs revealed a potassium 3.2, BNP 519, negative troponin, WBC 12, and a Hgb 12.2. CXR showed mild bibasilar opacities that may reflect mild interstitial edema or possibly PNA. Hospitalist was asked to admit her for acute diastolic CHF and atypical CP.   Rewiew of Systems:  Constitutional: Negative for malaise, fever and chills. No significant weight loss or weight gain Eyes: Negative for eye pain, redness and discharge, diplopia, visual changes, or flashes of light. ENMT: Negative for ear pain, hoarseness, nasal congestion, sinus pressure and sore throat. No headaches; tinnitus, drooling, or problem swallowing. Cardiovascular: Positive for diaphoresis and chest pain. Negative for palpitations,  and peripheral edema. ; No orthopnea, PND Respiratory: Positive for SOB, cough, wheezing. Negative for  Hemoptysis and stridor. No pleuritic chestpain. Gastrointestinal: Negative for nausea, vomiting, diarrhea, constipation, abdominal pain, melena, blood in stool, hematemesis, jaundice and rectal bleeding.    Genitourinary: Negative for frequency, dysuria, incontinence,flank pain and hematuria; Musculoskeletal: Negative for back pain and neck pain. Negative for swelling and trauma.;  Skin: . Negative for pruritus, rash, abrasions, bruising and skin lesion.; ulcerations Neuro:  Negative for headache, lightheadedness and neck stiffness. Negative for weakness, altered level of consciousness , altered mental status, extremity weakness, burning feet, involuntary movement, seizure and syncope.  Psych: negative for anxiety, depression, insomnia, tearfulness, panic attacks, hallucinations, paranoia, suicidal or homicidal ideation    Past Medical History  Diagnosis Date  . Polymyalgia rheumatica ([place])   . Stroke ([place])   . Essential hypertension, benign   . Hyperlipidemia   . Paroxysmal atrial fibrillation (HCC)     On Coumadin  . Coronary atherosclerosis of native coronary artery     Nonobstructive at cardiac catheterization 2004  . Dysphagia     Past Surgical History  Procedure Laterality Date  . Total abdominal hysterectomy    . Appendectomy    . Left ankle    . Toe surgery    . Colonoscopy  2008    Dr.Fleshiman    Medications:  HOME MEDS: Prior to Admission medications   Medication Sig Start Date End Date Taking? Authorizing Provider  atenolol (TENORMIN) 50 MG tablet Take 50 mg by mouth every evening.     Historical Provider, MD  atorvastatin (LIPITOR) 40 MG tablet Take 40 mg by mouth at bedtime.     Historical Provider, MD  Biotin 1000 MCG tablet Take 1,000 mcg by mouth daily.    Historical Provider, MD  Calcium Carbonate-Vitamin D (CALTRATE 600+D) 600-400 MG-UNIT per tablet Take 1 tablet by mouth daily.      Historical Provider, MD  citalopram (CELEXA) 20 MG tablet Take 20 mg by mouth daily.    Historical Provider, MD  dicyclomine (BENTYL) 10 MG capsule Take 1 capsule (10 mg total) by mouth daily before breakfast. 09/19/15   Rogene Houston, MD  diltiazem (CARDIZEM CD)  240 MG 24 hr capsule Take 240 mg by mouth daily.    Historical Provider, MD  furosemide (LASIX) 20 MG tablet Take 1 tablet (20 mg total) by mouth 2 (two) times daily. Patient taking differently: Take 20 mg by mouth daily.  12/31/14   Orvan Falconer, MD  losartan (COZAAR) 100 MG tablet Take 100  mg by mouth every evening.     Historical Provider, MD  predniSONE (DELTASONE) 5 MG tablet Take 5 mg by mouth daily.    Historical Provider, MD  warfarin (COUMADIN) 4 MG tablet Take 2-4 mg by mouth daily. Alternate taking 4mg  every evening with 2mg  every evening    Historical Provider, MD  Wheat Dextrin (BENEFIBER DRINK MIX) PACK Take 4 g by mouth at bedtime. 09/19/15   Rogene Houston, MD     Allergies:  Allergies  Allergen Reactions  . Sulfonamide Derivatives Rash    Social History:   reports that she has never smoked. She has never used smokeless tobacco. She reports that she does not drink alcohol or use illicit drugs.  Family History: Family History  Problem Relation Age of Onset  . Coronary artery disease Other   . Stroke Mother   . Hypertension Sister   . Hypertension Brother   . Rheum arthritis Daughter   . Healthy Son   . Healthy Son   . Healthy Son   . Hypertension Sister   . Heart disease Sister   . Heart disease Brother   . Hypertension Brother   . Arthritis Brother   . Colon cancer Brother   . Hypertension Brother      Physical Exam: Filed Vitals:   09/24/15 2330 09/24/15 2345 09/25/15 0000 09/25/15 0015  BP:   154/69   Pulse: 95 102 91 105  Resp: 22 26 18 18   SpO2: 96% 97% 98% 98%   Blood pressure 154/69, pulse 105, resp. rate 18, SpO2 98 %.  GEN:  Pleasant. Patient lying in the stretcher in no acute distress; cooperative with exam. PSYCH:  alert and oriented x4; does not appear anxious or depressed; affect is appropriate. HEENT: Mucous membranes pink and anicteric; PERRLA; EOM intact; no cervical lymphadenopathy nor thyromegaly or carotid bruit; no JVD; There were no stridor. Neck is very supple. Breasts:: Not examined CHEST WALL: No tenderness CHEST: Normal respiration, she still has bilateral rales.  No wheezing.  HEART: Regular rate and rhythm.  There are no rub, or gallops. Early systolic ejection murmur at left sternal border, consistent with  aortic stenosis or sclerosis. Non-critical. No click.    BACK: No kyphosis or scoliosis; no CVA tenderness ABDOMEN: soft and non-tender; no masses, no organomegaly, normal abdominal bowel sounds; no pannus; no intertriginous candida. There is no rebound and no distention. Rectal Exam: Not done EXTREMITIES: No bone or joint deformity; age-appropriate arthropathy of the hands and knees; no edema; no ulcerations.  There is no calf tenderness. Genitalia: not examined PULSES: 2+ and symmetric SKIN: Normal hydration no rash or ulceration CNS: Cranial nerves 2-12 grossly intact no focal lateralizing neurologic deficit.  Speech is fluent; uvula elevated with phonation, facial symmetry and tongue midline. DTR are normal bilaterally, cerebella exam is intact, barbinski is negative and strengths are equaled bilaterally.  No sensory loss.   Labs on Admission:  Basic Metabolic Panel:  Recent Labs Lab 09/24/15 2350  NA 134*  K 3.2*  CL 103  CO2 21*  GLUCOSE 135*  BUN 15  CREATININE 0.84  CALCIUM 8.4*   Liver  Function Tests:  Recent Labs Lab 09/24/15 2350  AST 19  ALT 14  ALKPHOS 77  BILITOT 1.0  PROT 6.9  ALBUMIN 4.0    CBC:  Recent Labs Lab 09/24/15 2350  WBC 12.0*  NEUTROABS 10.4*  HGB 12.2  HCT 36.1  MCV 91.4  PLT 258   Cardiac Enzymes:  Recent Labs Lab 09/24/15 2350  TROPONINI <0.03    Radiological Exams on Admission: Dg Chest Port 1 View  09/24/2015  CLINICAL DATA:  Acute onset of shortness of breath and cough. Initial encounter. EXAM: PORTABLE CHEST 1 VIEW COMPARISON:  Chest radiograph performed 06/23/2015 FINDINGS: A small left pleural effusion is suspected. Mild bibasilar opacities may reflect mild interstitial edema or possibly pneumonia. No pneumothorax is seen. The cardiomediastinal silhouette is borderline enlarged. No acute osseous abnormalities are identified. IMPRESSION: Small left pleural effusion suspected. Mild bibasilar opacities may reflect mild  interstitial edema or possibly pneumonia, given the patient's symptoms. Borderline cardiomegaly. Electronically Signed   By: Garald Balding M.D.   On: 09/24/2015 23:59    EKG: Independently reviewed.    Assessment/Plan  Acute on chronic diastolic CHF. Aortic Stenosis Chronic afib on anticoagulation HTN  PLAN: Patient will be admitted for acute on chronic diastolic CHF. We will give her low dose IV Lasix and monitor her I&Os. Continue supplemental O2 as needed. Will order an ECHO in the am. Her chronic atrial fibrillation is currently stable. We will continue her anticoagulation with Coumadin. Will continue Cozaar for her HTN and use Hydralazine PRN. For her chest pain, initial troponin was negative. Will continue to cycle troponins.  She was given NTP as well.  She is stable, full code, and will be admitted to telemetry under [facility] service.  Thank you and Good Day.   Other plans as per orders. Code Status: Full    Orvan Falconer, MD. FACP [facility] Pager [phone] 7pm to 7am.  09/25/2015, 1:34 AM   By signing my name below, I, Rosalie Doctor, attest that this documentation has been prepared under the direction and in the presence of Orvan Falconer, MD Electronically Signed: Rosalie Doctor, Scribe. 09/25/2015

## 2015-09-25 NOTE — Care Management Note (Signed)
Case Management Note  Patient Details  Name: [name] [name] MRN: [id number] Date of Birth: 10/17/1929  Subjective/Objective:         Spoke with patient for discharge planning. She is alert and oriented from home with husband. Stated that she has a walker and that she doesn't use it. Also has a cane. Denies difficulty making appointments. Cardiologist is Dr Duard Brady. No CM needs identified , Has had [facility] in the past for PT and would like to continue with them if [facility] services needed.        Action/Plan:  Anticipated discharge plan is home with self care.   Expected Discharge Date:                  Expected Discharge Plan:  Home/Self Care  In-House Referral:     Discharge planning Services  CM Consult  Post Acute Care Choice:    Choice offered to:  Patient  DME Arranged:    DME Agency:     HH Arranged:    [place] Agency:  [place]  Status of Service:  In process, will continue to follow  Medicare Important Message Given:    Date Medicare IM Given:    Medicare IM give by:    Date Additional Medicare IM Given:    Additional Medicare Important Message give by:     If discussed at [place] of Stay Meetings, dates discussed:    Additional Comments:  Alvie Heidelberg, RN 09/25/2015, 11:37 AM

## 2015-09-26 LAB — BASIC METABOLIC PANEL
Anion gap: 12 (ref 5–15)
BUN: 19 mg/dL (ref 6–20)
CHLORIDE: 101 mmol/L (ref 101–111)
CO2: 25 mmol/L (ref 22–32)
CREATININE: 0.88 mg/dL (ref 0.44–1.00)
Calcium: 9 mg/dL (ref 8.9–10.3)
GFR calc Af Amer: >60 mL/min (ref >60)
GFR calc non Af Amer: 59 mL/min — ABNORMAL LOW (ref >60)
GLUCOSE: 145 mg/dL — AB (ref 65–99)
Potassium: 3.3 mmol/L — ABNORMAL LOW (ref 3.5–5.1)
Sodium: 138 mmol/L (ref 135–145)

## 2015-09-26 LAB — CBC
HCT: 36.8 % (ref 36.0–46.0)
Hemoglobin: 12.7 g/dL (ref 12.0–15.0)
MCH: 31.1 pg (ref 26.0–34.0)
MCHC: 34.5 g/dL (ref 30.0–36.0)
MCV: 90 fL (ref 78.0–100.0)
PLATELETS: 289 10*3/uL (ref 150–400)
RBC: 4.09 MIL/uL (ref 3.87–5.11)
RDW: 13.6 % (ref 11.5–15.5)
WBC: 10.8 10*3/uL — ABNORMAL HIGH (ref 4.0–10.5)

## 2015-09-26 LAB — PROTIME-INR
INR: 1.88 — ABNORMAL HIGH (ref 0.00–1.49)
Prothrombin Time: 21.5 seconds — ABNORMAL HIGH (ref 11.6–15.2)

## 2015-09-26 MED ORDER — WARFARIN SODIUM 2 MG PO TABS
4.0000 mg | ORAL_TABLET | Freq: Once | ORAL | Status: AC
  Administered 2015-09-26: 4 mg via ORAL
  Filled 2015-09-26: qty 2

## 2015-09-26 MED ORDER — FUROSEMIDE 10 MG/ML IJ SOLN
40.0000 mg | Freq: Every day | INTRAMUSCULAR | Status: DC
  Administered 2015-09-27: 40 mg via INTRAVENOUS
  Filled 2015-09-26: qty 4

## 2015-09-26 MED ORDER — WARFARIN - PHARMACIST DOSING INPATIENT
Status: DC
  Administered 2015-09-26 – 2015-09-27 (×2)

## 2015-09-26 NOTE — Progress Notes (Signed)
[facility] PROGRESS NOTE  ERIK MESAROS [id number] DOB: [birth year] DOA: 09/24/2015 PCP: Gar Ponto, MD  Interim Summary Mrs. Traughber is a pleasant 82 year old female with a past medical history of diastolic congestive heart failure, last transthoracic echocardiogram performed 12/30/2014 that showed preserved EF of 60-65%. She also has a history of atrial fibrillation on chronic anticoagulation with warfarin admitted to the medicine service on 09/25/2015 when she presented with complaints of worsening shortness of breath. She reported symptoms progressively worsening over the past week 4 hours. This was a session with a 3-4 pound waking over the past 2 weeks. She was hypoxemic on admission with O2 sats of 80's required BiPAP there be. Workup included a chest x-ray that showed bibasilar opacities consistent with interstitial edema. Symptoms likely secondary to acute decompensated congestive heart failure as she was started on IV Lasix 40 mg twice a day. Cardiology consulted.   Assessment/Plan: 1. Acute hypoxemic respiratory failure -Evidenced by O2 sats in the 80s requiring noninvasive positive pressure ventilation -Chest x-ray showing bibasilar opacities consistent with interstitial edema -Secondary to acute diastolic congestive heart failure, she was started on IV Lasix at 40 mg twice a day -Now much improved, weaned off of supplemental oxygen  2.  Acute on chronic diastolic congestive heart failure -Her last transthoracic echocardiogram was performed on 12/30/2014 that showed a preserved EF of 60-65% -She presents with clinical signs symptoms consistent with acute CHF, reporting 3-4 pound waking over the past several weeks. At home she had been taking Lasix 20 mg by mouth daily. He was started on Lasix 40 mg IV twice a day during this hospitalization. Overnight she had a urinary output of 875 mL -On 09/26/2015: Continues to show clinical improvement, labs reviewed, renal function is  stable. Weight coming down from 75.9 Kg to 73.09 this am. Has a net negative fluid balance of 3.1 L. She had dyspnea with ambulation. Will continue Lasix at 40 mg IV q daily  -Cardiology consulted   3.  History of chronic atrial fibrillation, CHADSVasc score of 4 -Ventricular rates in the 80s to 90s -Plan to continue diltiazem 240 mg by mouth daily and atenolol 50 mg by mouth daily -Pharmacy consulted for warfarin management. A.m. lab work showing therapeutic INR of 2.03 with PT of 22.8.  4.  Hypertension. -Blood pressures are controlled, continue diltiazem 240 mg by mouth daily, atenolol 50 mg by mouth daily at bedtime, losartan 100 mg by mouth daily, IV Lasix and nitroglycerin  5.  Dyslipidemia -Continue Lipitor 40 mg by mouth daily  Code Status: Full code Family Communication: Spoke with her daughter was present at bedside Disposition Plan: Continue IV diuresis   Consultants:  Cardiology   HPI/Subjective: She reports feeling better this AM, although reported feeling winded walking down the hallway.   Objective: Filed Vitals:   09/25/15 2057 09/26/15 0607  BP: 173/68 164/71  Pulse: 83 71  Temp: 100.2 F (37.9 C) 98.4 F (36.9 C)  Resp: 20 17    Intake/Output Summary (Last 24 hours) at 09/26/15 0845 Last data filed at 09/26/15 [id number]  Gross per 24 hour  Intake    720 ml  Output   3200 ml  Net  -2480 ml   Filed Weights   09/25/15 0347 09/26/15 0607  Weight: 75.932 kg (167 lb 6.4 oz) 73.029 kg (161 lb)    Exam:   General:  Patient is awake and alert, looks better  Cardiovascular: Irregular rate and rhythm normal S1-S2  Respiratory: There are fine  crackles at bases otherwise good air movement no wheezing or rales  Abdomen: Soft nontender nondistended  Musculoskeletal: No edema  Data Reviewed: Basic Metabolic Panel:  Recent Labs Lab 09/24/15 2350 09/26/15 0556  NA 134* 138  K 3.2* 3.3*  CL 103 101  CO2 21* 25  GLUCOSE 135* 145*  BUN 15 19   CREATININE 0.84 0.88  CALCIUM 8.4* 9.0   Liver Function Tests:  Recent Labs Lab 09/24/15 2350  AST 19  ALT 14  ALKPHOS 77  BILITOT 1.0  PROT 6.9  ALBUMIN 4.0   No results for input(s): LIPASE, AMYLASE in the last 168 hours. No results for input(s): AMMONIA in the last 168 hours. CBC:  Recent Labs Lab 09/24/15 2350 09/26/15 0556  WBC 12.0* 10.8*  NEUTROABS 10.4*  --   HGB 12.2 12.7  HCT 36.1 36.8  MCV 91.4 90.0  PLT 258 289   Cardiac Enzymes:  Recent Labs Lab 09/24/15 2350 09/25/15 0423 09/25/15 0923 09/25/15 1539  TROPONINI <0.03 <0.03 <0.03 <0.03   BNP (last 3 results)  Recent Labs  12/29/14 1452 12/30/14 0544 09/24/15 2350  BNP 396.0* 228.0* 519.0*    ProBNP (last 3 results) No results for input(s): PROBNP in the last 8760 hours.  CBG: No results for input(s): GLUCAP in the last 168 hours.  No results found for this or any previous visit (from the past 240 hour(s)).   Studies: Dg Chest Port 1 View  09/24/2015  CLINICAL DATA:  Acute onset of shortness of breath and cough. Initial encounter. EXAM: PORTABLE CHEST 1 VIEW COMPARISON:  Chest radiograph performed 06/23/2015 FINDINGS: A small left pleural effusion is suspected. Mild bibasilar opacities may reflect mild interstitial edema or possibly pneumonia. No pneumothorax is seen. The cardiomediastinal silhouette is borderline enlarged. No acute osseous abnormalities are identified. IMPRESSION: Small left pleural effusion suspected. Mild bibasilar opacities may reflect mild interstitial edema or possibly pneumonia, given the patient's symptoms. Borderline cardiomegaly. Electronically Signed   By: Garald Balding M.D.   On: 09/24/2015 23:59    Scheduled Meds: . atenolol  50 mg Oral QPM  . atorvastatin  40 mg Oral QHS  . calcium-vitamin D  2 tablet Oral Q breakfast  . citalopram  20 mg Oral Daily  . diltiazem  240 mg Oral Daily  . furosemide  40 mg Intravenous Daily  . losartan  100 mg Oral QPM  .  nitroGLYCERIN  1 inch Topical 3 times per day  . psyllium  1 packet Oral QHS  . sodium chloride flush  3 mL Intravenous Q12H   Continuous Infusions:   Principal Problem:   Acute diastolic congestive heart failure (HCC) Active Problems:   Essential hypertension, benign   Chronic atrial fibrillation (HCC)   Long term (current) use of anticoagulants   Acute diastolic CHF (congestive heart failure) (HCC)   Atypical chest pain    Time spent: 35 min    Balin Vandegrift  [facility] Pager [phone]. If 7PM-7AM, please contact night-coverage at [email or web address], password [facility] 09/26/2015, 8:45 AM  LOS: 1 day

## 2015-09-26 NOTE — Progress Notes (Signed)
ANTICOAGULATION CONSULT NOTE - follow up  Pharmacy Consult for coumadin Indication: atrial fibrillation  Allergies  Allergen Reactions  . Sulfonamide Derivatives Rash   Patient Measurements: Height: 5' 6.5" (168.9 cm) Weight: 161 lb (73.029 kg) IBW/kg (Calculated) : 60.45  Vital Signs: Temp: 98.8 F (37.1 C) (02/07 1100) Temp Source: Oral (02/07 1100) BP: 165/81 mmHg (02/07 1100) Pulse Rate: 80 (02/07 1100)  Labs:  Recent Labs  09/24/15 2350 09/25/15 0423 09/25/15 0923 09/25/15 1539 09/26/15 0556  HGB 12.2  --   --   --  12.7  HCT 36.1  --   --   --  36.8  PLT 258  --   --   --  289  LABPROT  --   --  22.8*  --  21.5*  INR  --   --  2.03*  --  1.88*  CREATININE 0.84  --   --   --  0.88  TROPONINI <0.03 <0.03 <0.03 <0.03  --    Estimated Creatinine Clearance: 50.1 mL/min (by C-G formula based on Cr of 0.88).  Medical History: Past Medical History  Diagnosis Date  . Polymyalgia rheumatica ([place])   . Stroke ([place])   . Essential hypertension, benign   . Hyperlipidemia   . Paroxysmal atrial fibrillation (HCC)     On Coumadin  . Coronary atherosclerosis of native coronary artery     Nonobstructive at cardiac catheterization 2004  . Dysphagia    Medications:  Prescriptions prior to admission  Medication Sig Dispense Refill Last Dose  . acetaminophen (TYLENOL) 500 MG tablet Take 1,000 mg by mouth every 6 (six) hours as needed.   Past Week at Unknown time  . atenolol (TENORMIN) 50 MG tablet Take 50 mg by mouth every evening.    09/24/2015 at 0730pm  . atorvastatin (LIPITOR) 40 MG tablet Take 40 mg by mouth at bedtime.    09/24/2015 at Unknown time  . Biotin 1000 MCG tablet Take 1,000 mcg by mouth daily.   09/24/2015 at Unknown time  . citalopram (CELEXA) 20 MG tablet Take 20 mg by mouth daily.   09/24/2015 at Unknown time  . dicyclomine (BENTYL) 10 MG capsule Take 1 capsule (10 mg total) by mouth daily before breakfast. 30 capsule 3 09/24/2015 at Unknown time  . diltiazem  (CARDIZEM CD) 240 MG 24 hr capsule Take 240 mg by mouth daily.   09/24/2015 at Unknown time  . furosemide (LASIX) 20 MG tablet Take 1 tablet (20 mg total) by mouth 2 (two) times daily. (Patient taking differently: Take 20 mg by mouth daily. ) 30 tablet 1 09/24/2015 at Unknown time  . losartan (COZAAR) 100 MG tablet Take 100 mg by mouth every evening.    09/24/2015 at Unknown time  . predniSONE (DELTASONE) 5 MG tablet Take 5 mg by mouth daily.   09/24/2015 at Unknown time  . warfarin (COUMADIN) 4 MG tablet Take 2-4 mg by mouth daily. Alternate taking 4mg  every evening with 2mg  every evening   09/24/2015 at 0730pm  . Wheat Dextrin (BENEFIBER DRINK MIX) PACK Take 4 g by mouth at bedtime.   Past Week at Unknown time  . Calcium Carbonate-Vitamin D (CALTRATE 600+D) 600-400 MG-UNIT per tablet Take 1 tablet by mouth daily.     Taking   Assessment: 82 yo lady to continue coumadin for afib.  INR trending down.  Now subtherapeutic.  Home dose noted above on PTA med list.   Goal of Therapy:  INR 2-3 Monitor platelets by anticoagulation  protocol: Yes   Plan:  Coumadin 4 mg po today (since INR below goal) Check daily PT/INR Monitor for bleeding complications  Thanks for allowing pharmacy to be a part of this patient's care.  Hart Robinsons, PharmD Clinical Pharmacist 09/26/2015,1:33 PM

## 2015-09-26 NOTE — Consult Note (Signed)
   [facility] CM Inpatient Consult   09/26/2015  [name] [name] December 02, 1929 [id number]  Spoke with patient at bedside to introduce [facility] services. Patient does not want to participate with [facility] at this time. Patient given [facility] brochure and contact information for future reference.  Of note, [facility] Care Management services would not replace or interfere with any services that are arranged by inpatient case management or social work. For additional questions or referrals please contact: [name]. Laymond Purser, RN, BSN, [facility] Liaison [phone]

## 2015-09-27 DIAGNOSIS — J9621 Acute and chronic respiratory failure with hypoxia: Secondary | ICD-10-CM

## 2015-09-27 DIAGNOSIS — I5033 Acute on chronic diastolic (congestive) heart failure: Secondary | ICD-10-CM

## 2015-09-27 DIAGNOSIS — J40 Bronchitis, not specified as acute or chronic: Secondary | ICD-10-CM

## 2015-09-27 LAB — CBC
HEMATOCRIT: 38.7 % (ref 36.0–46.0)
HEMOGLOBIN: 13.1 g/dL (ref 12.0–15.0)
MCH: 30.9 pg (ref 26.0–34.0)
MCHC: 33.9 g/dL (ref 30.0–36.0)
MCV: 91.3 fL (ref 78.0–100.0)
Platelets: 256 10*3/uL (ref 150–400)
RBC: 4.24 MIL/uL (ref 3.87–5.11)
RDW: 13.9 % (ref 11.5–15.5)
WBC: 8.5 10*3/uL (ref 4.0–10.5)

## 2015-09-27 LAB — BASIC METABOLIC PANEL
ANION GAP: 10 (ref 5–15)
BUN: 20 mg/dL (ref 6–20)
CALCIUM: 8.7 mg/dL — AB (ref 8.9–10.3)
CO2: 29 mmol/L (ref 22–32)
Chloride: 97 mmol/L — ABNORMAL LOW (ref 101–111)
Creatinine, Ser: 1.12 mg/dL — ABNORMAL HIGH (ref 0.44–1.00)
GFR calc Af Amer: 51 mL/min — ABNORMAL LOW (ref >60)
GFR calc non Af Amer: 44 mL/min — ABNORMAL LOW (ref >60)
GLUCOSE: 112 mg/dL — AB (ref 65–99)
Potassium: 3 mmol/L — ABNORMAL LOW (ref 3.5–5.1)
Sodium: 136 mmol/L (ref 135–145)

## 2015-09-27 LAB — MAGNESIUM: MAGNESIUM: 1.9 mg/dL (ref 1.7–2.4)

## 2015-09-27 LAB — PROTIME-INR
INR: 1.74 — AB (ref 0.00–1.49)
Prothrombin Time: 20.3 seconds — ABNORMAL HIGH (ref 11.6–15.2)

## 2015-09-27 MED ORDER — WARFARIN SODIUM 2 MG PO TABS
4.0000 mg | ORAL_TABLET | Freq: Once | ORAL | Status: AC
  Administered 2015-09-27: 4 mg via ORAL
  Filled 2015-09-27: qty 2

## 2015-09-27 MED ORDER — POTASSIUM CHLORIDE CRYS ER 20 MEQ PO TBCR
40.0000 meq | EXTENDED_RELEASE_TABLET | Freq: Two times a day (BID) | ORAL | Status: AC
  Administered 2015-09-27 – 2015-09-28 (×3): 40 meq via ORAL
  Filled 2015-09-27 (×4): qty 2

## 2015-09-27 MED ORDER — FUROSEMIDE 20 MG PO TABS
30.0000 mg | ORAL_TABLET | Freq: Two times a day (BID) | ORAL | Status: DC
  Administered 2015-09-27 – 2015-09-28 (×2): 30 mg via ORAL
  Filled 2015-09-27 (×2): qty 2

## 2015-09-27 MED ORDER — DOXYCYCLINE HYCLATE 100 MG PO TABS
100.0000 mg | ORAL_TABLET | Freq: Two times a day (BID) | ORAL | Status: DC
  Administered 2015-09-27 – 2015-09-28 (×2): 100 mg via ORAL
  Filled 2015-09-27 (×2): qty 1

## 2015-09-27 MED ORDER — PREDNISONE 10 MG PO TABS
10.0000 mg | ORAL_TABLET | Freq: Every day | ORAL | Status: DC
  Administered 2015-09-28: 10 mg via ORAL
  Filled 2015-09-27: qty 1

## 2015-09-27 MED ORDER — BUDESONIDE 0.25 MG/2ML IN SUSP
0.2500 mg | Freq: Two times a day (BID) | RESPIRATORY_TRACT | Status: DC
  Administered 2015-09-27 – 2015-09-28 (×2): 0.25 mg via RESPIRATORY_TRACT
  Filled 2015-09-27 (×2): qty 2

## 2015-09-27 NOTE — Progress Notes (Signed)
[facility] PROGRESS NOTE  [name] [name] [id number] DOB: August 18, 1930 DOA: 09/24/2015 PCP: Gar Ponto, MD  Interim Summary Mrs. Cerf is a pleasant 82 year old female with a past medical history of diastolic congestive heart failure, last transthoracic echocardiogram performed 12/30/2014 that showed preserved EF of 60-65%. She also has a history of atrial fibrillation on chronic anticoagulation with warfarin admitted to the medicine service on 09/25/2015 when she presented with complaints of worsening shortness of breath. She reported symptoms progressively worsening over the past week 4 hours. This was a session with a 3-4 pound waking over the past 2 weeks. She was hypoxemic on admission with O2 sats of 80's required BiPAP there be. Workup included a chest x-ray that showed bibasilar opacities consistent with interstitial edema. Symptoms likely secondary to acute decompensated congestive heart failure as she was started on IV Lasix 40 mg twice a day. Cardiology consulted.   Assessment/Plan: 1. Acute hypoxemic respiratory failure -Evidenced by O2 sats in the 80s requiring noninvasive positive pressure ventilation -Chest x-ray showing bibasilar opacities consistent with interstitial edema -Secondary to acute diastolic congestive heart failure and bronchitis -she was started on IV Lasix at 40 mg twice a day and now will transitioned to PO lasix -will also use nebulizer treatment, doxycycline and flutter valve -weaning/assessing needs for supplemental oxygen  2.  Acute on chronic diastolic congestive heart failure -Her last transthoracic echocardiogram was performed on 12/30/2014 that showed a preserved EF of 60-65% -She presents with clinical signs symptoms consistent with acute CHF, reporting 3-4 pound over baseline -Cardiology consulted  -will transitioned to PO lasix  3.  History of chronic atrial fibrillation, CHADSVasc score of 4 -Ventricular rates in the 80s to 90s -Plan to  continue diltiazem 240 mg by mouth daily and atenolol 50 mg by mouth daily -Pharmacy consulted for warfarin management. A.m. lab work showing therapeutic INR of 2.03 with PT of 22.8.  4.  Hypertension. -Blood pressures are controlled, continue diltiazem 240 mg by mouth daily, atenolol 50 mg by mouth daily at bedtime, losartan 100 mg by mouth daily -will transitioned lasix to PO and continue PRN NTG  5.  Dyslipidemia -Continue Lipitor 40 mg by mouth daily  6. Wheezing/rhonchi and leukocytosis: due to bronchitis most likely -will treat with doxycycline, nebulizer tx and the use of flutter valve -CXR with bibasilar opacities and interstitial edema  7. PMR -continue prednisone   Code Status: Full code Family Communication: Spoke with her husband at bedside Disposition Plan: will transitioned to PO lasix, start nebulizer tx, doxycycline and flutter valve.   Consultants:  Cardiology   HPI/Subjective: She reports still difficulty breathing, is now wheezing and feels deconditioned.   Objective: Filed Vitals:   09/27/15 1215 09/27/15 1300  BP: 165/66 129/64  Pulse:  76  Temp:  99.3 F (37.4 C)  Resp:  20    Intake/Output Summary (Last 24 hours) at 09/27/15 1714 Last data filed at 09/27/15 1100  Gross per 24 hour  Intake    243 ml  Output    100 ml  Net    143 ml   Filed Weights   09/25/15 0347 09/26/15 0607 09/27/15 0458  Weight: 75.932 kg (167 lb 6.4 oz) 73.029 kg (161 lb) 72.258 kg (159 lb 4.8 oz)    Exam:   General:  Patient is awake and alert, feel somewhat better. Still reports SOB and is deconditioned  Cardiovascular: Irregular rate and rhythm normal S1-S2  Respiratory: positive wheezing and diffuse rhonchi; no frank crackles on exam.  Abdomen: Soft nontender nondistended  Musculoskeletal: No edema  Data Reviewed: Basic Metabolic Panel:  Recent Labs Lab 09/24/15 2350 09/26/15 0556 09/27/15 0630  NA 134* 138 136  K 3.2* 3.3* 3.0*  CL 103 101 97*   CO2 21* 25 29  GLUCOSE 135* 145* 112*  BUN 15 19 20   CREATININE 0.84 0.88 1.12*  CALCIUM 8.4* 9.0 8.7*  MG  --   --  1.9   Liver Function Tests:  Recent Labs Lab 09/24/15 2350  AST 19  ALT 14  ALKPHOS 77  BILITOT 1.0  PROT 6.9  ALBUMIN 4.0   CBC:  Recent Labs Lab 09/24/15 2350 09/26/15 0556 09/27/15 0630  WBC 12.0* 10.8* 8.5  NEUTROABS 10.4*  --   --   HGB 12.2 12.7 13.1  HCT 36.1 36.8 38.7  MCV 91.4 90.0 91.3  PLT 258 289 256   Cardiac Enzymes:  Recent Labs Lab 09/24/15 2350 09/25/15 0423 09/25/15 0923 09/25/15 1539  TROPONINI <0.03 <0.03 <0.03 <0.03   BNP (last 3 results)  Recent Labs  12/29/14 1452 12/30/14 0544 09/24/15 2350  BNP 396.0* 228.0* 519.0*   Studies: No results found.  Scheduled Meds: . atenolol  50 mg Oral QPM  . atorvastatin  40 mg Oral QHS  . budesonide (PULMICORT) nebulizer solution  0.25 mg Nebulization BID  . calcium-vitamin D  2 tablet Oral Q breakfast  . citalopram  20 mg Oral Daily  . diltiazem  240 mg Oral Daily  . furosemide  30 mg Oral BID  . losartan  100 mg Oral QPM  . nitroGLYCERIN  1 inch Topical 3 times per day  . potassium chloride  40 mEq Oral BID  . [START ON 09/28/2015] predniSONE  10 mg Oral Q breakfast  . psyllium  1 packet Oral QHS  . sodium chloride flush  3 mL Intravenous Q12H  . warfarin  4 mg Oral Once  . Warfarin - Pharmacist Dosing Inpatient   Does not apply Q24H   Continuous Infusions:   Principal Problem:   Acute diastolic congestive heart failure (HCC) Active Problems:   Essential hypertension, benign   Chronic atrial fibrillation (HCC)   Long term (current) use of anticoagulants   Acute diastolic CHF (congestive heart failure) (HCC)   Atypical chest pain    Time spent: 35 min    Barton Dubois  [facility] Pager [phone]. If 7PM-7AM, please contact night-coverage at [email or web address], password [facility] 09/27/2015, 5:14 PM  LOS: 2 days

## 2015-09-27 NOTE — Progress Notes (Signed)
ANTICOAGULATION CONSULT NOTE - follow up  Pharmacy Consult for coumadin Indication: atrial fibrillation  Allergies  Allergen Reactions  . Sulfonamide Derivatives Rash   Patient Measurements: Height: 5' 6.5" (168.9 cm) Weight: 159 lb 4.8 oz (72.258 kg) IBW/kg (Calculated) : 60.45  Vital Signs: Temp: 99.3 F (37.4 C) (02/08 0700) Temp Source: Oral (02/08 0458) BP: 156/67 mmHg (02/08 0458) Pulse Rate: 89 (02/08 0458)  Labs:  Recent Labs  09/24/15 2350 09/25/15 0423 09/25/15 0923 09/25/15 1539 09/26/15 0556 09/27/15 0630  HGB 12.2  --   --   --  12.7 13.1  HCT 36.1  --   --   --  36.8 38.7  PLT 258  --   --   --  289 256  LABPROT  --   --  22.8*  --  21.5* 20.3*  INR  --   --  2.03*  --  1.88* 1.74*  CREATININE 0.84  --   --   --  0.88 1.12*  TROPONINI <0.03 <0.03 <0.03 <0.03  --   --    Estimated Creatinine Clearance: 36.3 mL/min (by C-G formula based on Cr of 1.12).  Medical History: Past Medical History  Diagnosis Date  . Polymyalgia rheumatica ([place])   . Stroke ([place])   . Essential hypertension, benign   . Hyperlipidemia   . Paroxysmal atrial fibrillation (HCC)     On Coumadin  . Coronary atherosclerosis of native coronary artery     Nonobstructive at cardiac catheterization 2004  . Dysphagia    Medications:  Prescriptions prior to admission  Medication Sig Dispense Refill Last Dose  . acetaminophen (TYLENOL) 500 MG tablet Take 1,000 mg by mouth every 6 (six) hours as needed.   Past Week at Unknown time  . atenolol (TENORMIN) 50 MG tablet Take 50 mg by mouth every evening.    09/24/2015 at 0730pm  . atorvastatin (LIPITOR) 40 MG tablet Take 40 mg by mouth at bedtime.    09/24/2015 at Unknown time  . Biotin 1000 MCG tablet Take 1,000 mcg by mouth daily.   09/24/2015 at Unknown time  . citalopram (CELEXA) 20 MG tablet Take 20 mg by mouth daily.   09/24/2015 at Unknown time  . dicyclomine (BENTYL) 10 MG capsule Take 1 capsule (10 mg total) by mouth daily before  breakfast. 30 capsule 3 09/24/2015 at Unknown time  . diltiazem (CARDIZEM CD) 240 MG 24 hr capsule Take 240 mg by mouth daily.   09/24/2015 at Unknown time  . furosemide (LASIX) 20 MG tablet Take 1 tablet (20 mg total) by mouth 2 (two) times daily. (Patient taking differently: Take 20 mg by mouth daily. ) 30 tablet 1 09/24/2015 at Unknown time  . losartan (COZAAR) 100 MG tablet Take 100 mg by mouth every evening.    09/24/2015 at Unknown time  . predniSONE (DELTASONE) 5 MG tablet Take 5 mg by mouth daily.   09/24/2015 at Unknown time  . warfarin (COUMADIN) 4 MG tablet Take 2-4 mg by mouth daily. Alternate taking 4mg  every evening with 2mg  every evening   09/24/2015 at 0730pm  . Wheat Dextrin (BENEFIBER DRINK MIX) PACK Take 4 g by mouth at bedtime.   Past Week at Unknown time  . Calcium Carbonate-Vitamin D (CALTRATE 600+D) 600-400 MG-UNIT per tablet Take 1 tablet by mouth daily.     Taking   Assessment: 82 yo lady to continue coumadin for afib.  INR trending down.  Now subtherapeutic.  Home dose noted above on PTA med  list.   Goal of Therapy:  INR 2-3 Monitor platelets by anticoagulation protocol: Yes   Plan:  Coumadin 4 mg po today (since INR below goal) Check daily PT/INR Monitor for bleeding complications  Thanks for allowing pharmacy to be a part of this patient's care.  Hart Robinsons, PharmD Clinical Pharmacist 09/27/2015,1:20 PM

## 2015-09-28 ENCOUNTER — Inpatient Hospital Stay (HOSPITAL_COMMUNITY): Payer: Medicare Other

## 2015-09-28 DIAGNOSIS — J209 Acute bronchitis, unspecified: Secondary | ICD-10-CM | POA: Insufficient documentation

## 2015-09-28 DIAGNOSIS — R0602 Shortness of breath: Secondary | ICD-10-CM | POA: Insufficient documentation

## 2015-09-28 DIAGNOSIS — R0902 Hypoxemia: Secondary | ICD-10-CM | POA: Insufficient documentation

## 2015-09-28 DIAGNOSIS — E876 Hypokalemia: Secondary | ICD-10-CM | POA: Insufficient documentation

## 2015-09-28 LAB — PROTIME-INR
INR: 1.56 — AB (ref 0.00–1.49)
PROTHROMBIN TIME: 18.7 s — AB (ref 11.6–15.2)

## 2015-09-28 MED ORDER — DOXYCYCLINE HYCLATE 100 MG PO TABS: 100.0000 mg | ORAL_TABLET | Freq: Two times a day (BID) | ORAL | Status: DC

## 2015-09-28 MED ORDER — WARFARIN SODIUM 5 MG PO TABS
7.5000 mg | ORAL_TABLET | Freq: Once | ORAL | Status: AC
  Administered 2015-09-28: 7.5 mg via ORAL
  Filled 2015-09-28: qty 2

## 2015-09-28 MED ORDER — FUROSEMIDE 40 MG PO TABS: 40.0000 mg | ORAL_TABLET | Freq: Every day | ORAL | Status: DC

## 2015-09-28 NOTE — Evaluation (Signed)
Physical Therapy Evaluation Patient Details Name: [name] [name] MRN: [id number] DOB: [birth year] Today's Date: 09/28/2015   History of Present Illness  82yo white female who comes in to [facility] on 2/6 p worsening SOB. PMH: HTN, HLD, Diastolic HF, and Afib. EF estiamted at 60-65% this admission.   Clinical Impression  Pt is received semirecumbent in bed upon entry, awake, alert, and willing to participate. No acute distress noted. Pt reports zero falls in the last 6 months. Pt strength as screened by functional mobility assessment demonstrating generalized weakness, but appears to be at baseline as confirmed with patient. Pt is able to perform all transfers and bed mobility with modI, and ambulates with SPC with supervision. Pt falls risk is high as evidenced by slow gait speed, poor forward reach, and altered posturing during transfers and mobility; she also notes to feel mcuh more unsteady than her baseline level. Pt remaining on room air throughout evaluation, without desaturating with activity, but did complain of worsening DOE during gait trial, requriing standing rest breaks every 53ft. Patient presenting with impairment of strength, balance, and activity tolerance, limiting ability to perform IADL and community level mobility at baseline level of function. Patient will benefit from skilled intervention to address the above impairments and limitations, in order to restore to prior level of function, improve patient safety upon discharge, and to decrease falls risk.     Follow Up Recommendations Home health PT    Equipment Recommendations  None recommended by PT    Recommendations for Other Services       Precautions / Restrictions Precautions Precautions: None Restrictions Weight Bearing Restrictions: No      Mobility  Bed Mobility Overal bed mobility: Modified Independent                Transfers Overall transfer level: Modified independent Equipment used: Straight cane                 Ambulation/Gait Ambulation/Gait assistance: Supervision Ambulation Distance (Feet): 150 Feet Assistive device: Straight cane   Gait velocity: 0.40m/s, not suitable for community distances.  Gait velocity interpretation: <1.8 ft/sec, indicative of risk for recurrent falls General Gait Details: SPC in RUE, with 2-point gait, excessive toe out bilat, and severe LLE medial collapse involving the ankle and talus; reports that her balance feels more limited than usual.   Stairs            Wheelchair Mobility    Modified Rankin (Stroke Patients Only)       Balance Overall balance assessment: Needs assistance                             High Level Balance Comments: Limited forward reach <5inches, unsteadiness with rhomberg eyes closed/narrow, unable to fully attempt semitandem stance due to instability.              Pertinent Vitals/Pain Pain Assessment: No/denies pain    Home Living Family/patient expects to be discharged to:: Private residence Living Arrangements: Spouse/significant other Available Help at Discharge: Family Type of Home: House Home Access: Stairs to enter Entrance Stairs-Rails: Right Entrance Stairs-Number of Steps: 2 Home Layout: One level;Laundry or work area in basement (Market researcher to basement) Home Equipment: Environmental consultant - 2 wheels;Cane - single point;Bedside commode      Prior Function Level of Independence: Independent with assistive device(s)         Comments: Limited community distances with SPC.  Hand Dominance   Dominant Hand: Right    Extremity/Trunk Assessment   Upper Extremity Assessment: Generalized weakness;Overall [facility] for tasks assessed           Lower Extremity Assessment: Generalized weakness;Overall WFL for tasks assessed         Communication   Communication: No difficulties  Cognition Arousal/Alertness: Awake/alert Behavior During Therapy: WFL for tasks  assessed/performed Overall Cognitive Status: Within Functional Limits for tasks assessed                      General Comments      Exercises        Assessment/Plan    PT Assessment Patient needs continued PT services  PT Diagnosis Difficulty walking;Abnormality of gait;Generalized weakness   PT Problem List Decreased strength;Decreased activity tolerance;Decreased balance;Decreased mobility;Cardiopulmonary status limiting activity  PT Treatment Interventions Gait training;Functional mobility training;Therapeutic activities;Therapeutic exercise;Balance training;Patient/family education   PT Goals (Current goals can be found in the Care Plan section) Acute Rehab PT Goals Patient Stated Goal: go home, feel better  PT Goal Formulation: With patient Time For Goal Achievement: 10/12/15 Potential to Achieve Goals: Good    Frequency Min 3X/week   Barriers to discharge        Co-evaluation               End of Session Equipment Utilized During Treatment: Gait belt Activity Tolerance: Patient tolerated treatment well;Patient limited by fatigue Patient left: in chair;with call bell/phone within reach Nurse Communication: Mobility status;Other (comment)         Time: [id number] PT Time Calculation (min) (ACUTE ONLY): 19 min   Charges:   PT Evaluation $PT Eval Low Complexity: 1 Procedure PT Treatments $Therapeutic Activity: 8-22 mins   PT G Codes:        1:36 PM, Sep 30, 2015 Etta Grandchild, PT, DPT PRN Physical Therapist at [place] License # [id number] (wireless)  [phone] (mobile)

## 2015-09-28 NOTE — Care Management Important Message (Signed)
Important Message  Patient Details  Name: [name] [name] MRN: [id number] Date of Birth: 23-Jun-1930   Medicare Important Message Given:  Yes    Alvie Heidelberg, RN 09/28/2015, 12:01 PM

## 2015-09-28 NOTE — Progress Notes (Signed)
Discharged to home with spouse.

## 2015-09-28 NOTE — Discharge Summary (Signed)
Physician Discharge Summary  [name] [name] [id number] DOB: September 28, 1929 DOA: 09/24/2015  PCP: Gar Ponto, MD  Admit date: 09/24/2015 Discharge date: 09/28/2015  Time spent: 35 minutes  Recommendations for Outpatient Follow-up:  1. Repeat BMET to follow electrolytes and renal function 2. Please reassess BP and volume status; re-adjust medications as needed 3. See below (hospital course by problems), for further details and outpatient needs.  Discharge Diagnoses:  Principal Problem:   Acute diastolic congestive heart failure (HCC) Active Problems:   Essential hypertension, benign   Chronic atrial fibrillation (HCC)   Long term (current) use of anticoagulants   Acute diastolic CHF (congestive heart failure) (HCC)   Atypical chest pain   Discharge Condition: stable and improved. Will discharge home with [facility] services and instructions to follow with PCP in 10 days and with cardiology as an outpatient (cardiology office will contact her with appointment details)  Diet recommendation: low sodium diet   Filed Weights   09/26/15 0607 09/27/15 0458 09/28/15 0646  Weight: 73.029 kg (161 lb) 72.258 kg (159 lb 4.8 oz) 71.578 kg (157 lb 12.8 oz)    History of present illness:  82 y.o. female with a hx of HTN, HLD, diastolic CHF, and atrial fibrillation presents with SOB that began this morning. Patient reports worsening SOB, a cough, wheezing, diaphoresis, and left sided chest pain. On EMS arrival, her initial O2 sat was in the 80s so she was placed on BIPAP with some improvement. She denies any fever, chills, nausea, vomiting, palpitations, or HA. While in the ED, patient was tachycardic and tachypneic. She is currently stable on low flow [place]. Labs revealed a potassium 3.2, BNP 519, negative troponin, WBC 12, and a Hgb 12.2. CXR showed mild bibasilar opacities that may reflect mild interstitial edema or possibly PNA. Hospitalist was asked to admit her for acute diastolic CHF and atypical CP.    Hospital Course:  1. Acute hypoxemic respiratory failure -Evidenced by O2 sats in the 80s requiring noninvasive positive pressure ventilation on admission. -Chest x-ray showing bibasilar opacities; most likely Secondary to acute diastolic congestive heart failure and bronchitis -she was started on IV Lasix at 40 mg twice a day and now will transitioned to PO lasix 40mg  daily -will also complete tx with doxycycline and flutter valve -no oxygen supplementation needed at discharge  2. Acute on chronic diastolic congestive heart failure -Her last transthoracic echocardiogram was performed on 12/30/2014 that showed a preserved EF of 60-65% -patient ask to follow low sodium diet and daily weights check  -Cardiology consulted, appreciate rec's  -will transitioned to PO lasix 40mg  daily; patient advise to be compliant with medications  3. History of chronic atrial fibrillation, CHADSVasc score of 4 -Ventricular rates in the 80s to 90s -Plan to continue diltiazem 240 mg by mouth daily and atenolol 50 mg by mouth daily -Pharmacy consulted for warfarin management. therapeutic INR at discharge  4. Hypertension. -Blood pressures are controlled, continue diltiazem 240 mg by mouth daily, atenolol 50 mg by mouth daily at bedtime, losartan 100 mg by mouth daily -will transitioned lasix to PO as recommended by cardiology service (40mg  daily)  5. Dyslipidemia -Continue Lipitor 40 mg by mouth daily  6. Wheezing/rhonchi and leukocytosis: due to bronchitis most likely -will complete therapy with doxycycline and use of flutter valve -CXR with bibasilar opacities and interstitial edema on admission; improved/clear on repeat CXR priro to discharge -no WBC's and afebrile.  7. PMR -continue prednisone   8. Physical deconditioning: will arrange for [facility] PT  and HHRN  Procedures:  See below for x-ray reports   Consultations:  Cardiology   Discharge Exam: Filed Vitals:   09/28/15 0646 09/28/15  1000  BP: 154/73 113/57  Pulse: 79 82  Temp: 97.8 F (36.6 C) 97.6 F (36.4 C)  Resp: 18 18   General: Patient is awake and alert, feel much better. Denies CP and SOB. Still weak, but asking for [facility] services. Cardiovascular: Irregular rate and rhythm normal S1-S2 Respiratory: good air movement, no wheezing, no frank crackles on exam.  Abdomen: Soft nontender nondistended Musculoskeletal: No edema   Discharge Instructions   Discharge Instructions    Diet - low sodium heart healthy    Complete by:  As directed      Discharge instructions    Complete by:  As directed   Take medications as prescribed Follow a low sodium diet (less than 2 gram daily) Check your weight on daily basis Follow with cardiology after discharge (office will contact you with appointment details) Arrange follow up with PCP in 10 days          Current Discharge Medication List    START taking these medications   Details  doxycycline (VIBRA-TABS) 100 MG tablet Take 1 tablet (100 mg total) by mouth every 12 (twelve) hours. Qty: 10 tablet, Refills: 0      CONTINUE these medications which have CHANGED   Details  furosemide (LASIX) 40 MG tablet Take 1 tablet (40 mg total) by mouth daily. Qty: 30 tablet, Refills: 1      CONTINUE these medications which have NOT CHANGED   Details  acetaminophen (TYLENOL) 500 MG tablet Take 1,000 mg by mouth every 6 (six) hours as needed.    atenolol (TENORMIN) 50 MG tablet Take 50 mg by mouth every evening.     atorvastatin (LIPITOR) 40 MG tablet Take 40 mg by mouth at bedtime.     Biotin 1000 MCG tablet Take 1,000 mcg by mouth daily.    citalopram (CELEXA) 20 MG tablet Take 20 mg by mouth daily.    dicyclomine (BENTYL) 10 MG capsule Take 1 capsule (10 mg total) by mouth daily before breakfast. Qty: 30 capsule, Refills: 3   Associated Diagnoses: Chronic diarrhea    diltiazem (CARDIZEM CD) 240 MG 24 hr capsule Take 240 mg by mouth daily.    losartan (COZAAR)  100 MG tablet Take 100 mg by mouth every evening.     predniSONE (DELTASONE) 5 MG tablet Take 5 mg by mouth daily.    warfarin (COUMADIN) 4 MG tablet Take 2-4 mg by mouth daily. Alternate taking 4mg  every evening with 2mg  every evening    Wheat Dextrin (BENEFIBER DRINK MIX) PACK Take 4 g by mouth at bedtime.   Associated Diagnoses: Chronic diarrhea    Calcium Carbonate-Vitamin D (CALTRATE 600+D) 600-400 MG-UNIT per tablet Take 1 tablet by mouth daily.         Allergies  Allergen Reactions  . Sulfonamide Derivatives Rash   Follow-up Information    Follow up with Gar Ponto, MD. Schedule an appointment as soon as possible for a visit in 10 days.   Specialty:  Family Medicine   Contact information:   [address] [place] [id number] [phone]       The results of significant diagnostics from this hospitalization (including imaging, microbiology, ancillary and laboratory) are listed below for reference.    Significant Diagnostic Studies: Dg Chest 2 View  09/28/2015  CLINICAL DATA:  Shortness of breath and  cough. EXAM: CHEST  2 VIEW COMPARISON:  09/24/2015 FINDINGS: The cardiac silhouette is upper limits of normal in size. The patient has taken a greater inspiration than on the prior study and there is improved aeration of the lung bases. No airspace consolidation, edema, pleural effusion, or pneumothorax is seen. No acute osseous abnormality is identified. IMPRESSION: Improved aeration of the lung bases. No evidence of active cardiopulmonary disease. Electronically Signed   By: Logan Bores M.D.   On: 09/28/2015 09:09   Dg Chest Port 1 View  09/24/2015  CLINICAL DATA:  Acute onset of shortness of breath and cough. Initial encounter. EXAM: PORTABLE CHEST 1 VIEW COMPARISON:  Chest radiograph performed 06/23/2015 FINDINGS: A small left pleural effusion is suspected. Mild bibasilar opacities may reflect mild interstitial edema or possibly pneumonia. No pneumothorax is seen. The  cardiomediastinal silhouette is borderline enlarged. No acute osseous abnormalities are identified. IMPRESSION: Small left pleural effusion suspected. Mild bibasilar opacities may reflect mild interstitial edema or possibly pneumonia, given the patient's symptoms. Borderline cardiomegaly. Electronically Signed   By: Garald Balding M.D.   On: 09/24/2015 23:59   Labs: Basic Metabolic Panel:  Recent Labs Lab 09/24/15 2350 09/26/15 0556 09/27/15 0630  NA 134* 138 136  K 3.2* 3.3* 3.0*  CL 103 101 97*  CO2 21* 25 29  GLUCOSE 135* 145* 112*  BUN 15 19 20   CREATININE 0.84 0.88 1.12*  CALCIUM 8.4* 9.0 8.7*  MG  --   --  1.9   Liver Function Tests:  Recent Labs Lab 09/24/15 2350  AST 19  ALT 14  ALKPHOS 77  BILITOT 1.0  PROT 6.9  ALBUMIN 4.0   CBC:  Recent Labs Lab 09/24/15 2350 09/26/15 0556 09/27/15 0630  WBC 12.0* 10.8* 8.5  NEUTROABS 10.4*  --   --   HGB 12.2 12.7 13.1  HCT 36.1 36.8 38.7  MCV 91.4 90.0 91.3  PLT 258 289 256   Cardiac Enzymes:  Recent Labs Lab 09/24/15 2350 09/25/15 0423 09/25/15 0923 09/25/15 1539  TROPONINI <0.03 <0.03 <0.03 <0.03   BNP: BNP (last 3 results)  Recent Labs  12/29/14 1452 12/30/14 0544 09/24/15 2350  BNP 396.0* 228.0* 519.0*     Signed:  Barton Dubois MD.  [facility] 09/28/2015, 11:50 AM

## 2015-09-28 NOTE — Progress Notes (Signed)
ANTICOAGULATION CONSULT NOTE - follow up  Pharmacy Consult for coumadin Indication: atrial fibrillation  Allergies  Allergen Reactions  . Sulfonamide Derivatives Rash   Patient Measurements: Height: 5' 6.5" (168.9 cm) Weight: 157 lb 12.8 oz (71.578 kg) IBW/kg (Calculated) : 60.45  Vital Signs: Temp: 97.6 F (36.4 C) (02/09 1000) Temp Source: Oral (02/09 1000) BP: 113/57 mmHg (02/09 1000) Pulse Rate: 82 (02/09 1000)  Labs:  Recent Labs  09/25/15 1539 09/26/15 0556 09/27/15 0630 09/28/15 0631  HGB  --  12.7 13.1  --   HCT  --  36.8 38.7  --   PLT  --  289 256  --   LABPROT  --  21.5* 20.3* 18.7*  INR  --  1.88* 1.74* 1.56*  CREATININE  --  0.88 1.12*  --   TROPONINI <0.03  --   --   --    Estimated Creatinine Clearance: 36.3 mL/min (by C-G formula based on Cr of 1.12).  Medical History: Past Medical History  Diagnosis Date  . Polymyalgia rheumatica ([place])   . Stroke ([place])   . Essential hypertension, benign   . Hyperlipidemia   . Paroxysmal atrial fibrillation (HCC)     On Coumadin  . Coronary atherosclerosis of native coronary artery     Nonobstructive at cardiac catheterization 2004  . Dysphagia    Medications:  Prescriptions prior to admission  Medication Sig Dispense Refill Last Dose  . acetaminophen (TYLENOL) 500 MG tablet Take 1,000 mg by mouth every 6 (six) hours as needed.   Past Week at Unknown time  . atenolol (TENORMIN) 50 MG tablet Take 50 mg by mouth every evening.    09/24/2015 at 0730pm  . atorvastatin (LIPITOR) 40 MG tablet Take 40 mg by mouth at bedtime.    09/24/2015 at Unknown time  . Biotin 1000 MCG tablet Take 1,000 mcg by mouth daily.   09/24/2015 at Unknown time  . citalopram (CELEXA) 20 MG tablet Take 20 mg by mouth daily.   09/24/2015 at Unknown time  . dicyclomine (BENTYL) 10 MG capsule Take 1 capsule (10 mg total) by mouth daily before breakfast. 30 capsule 3 09/24/2015 at Unknown time  . diltiazem (CARDIZEM CD) 240 MG 24 hr capsule Take 240  mg by mouth daily.   09/24/2015 at Unknown time  . losartan (COZAAR) 100 MG tablet Take 100 mg by mouth every evening.    09/24/2015 at Unknown time  . predniSONE (DELTASONE) 5 MG tablet Take 5 mg by mouth daily.   09/24/2015 at Unknown time  . warfarin (COUMADIN) 4 MG tablet Take 2-4 mg by mouth daily. Alternate taking 4mg  every evening with 2mg  every evening   09/24/2015 at 0730pm  . Wheat Dextrin (BENEFIBER DRINK MIX) PACK Take 4 g by mouth at bedtime.   Past Week at Unknown time  . [DISCONTINUED] furosemide (LASIX) 20 MG tablet Take 1 tablet (20 mg total) by mouth 2 (two) times daily. (Patient taking differently: Take 20 mg by mouth daily. ) 30 tablet 1 09/24/2015 at Unknown time  . Calcium Carbonate-Vitamin D (CALTRATE 600+D) 600-400 MG-UNIT per tablet Take 1 tablet by mouth daily.     Taking   Assessment: 82 yo lady to continue coumadin for afib.  INR trending down.  Now subtherapeutic and INR continues to trend down.  Doses charted as given.   Home dose noted above on PTA med list.   Goal of Therapy:  INR 2-3 Monitor platelets by anticoagulation protocol: Yes   Plan:  Coumadin 7.5 mg po today to boost INR Check daily PT/INR Monitor for bleeding complications  Thanks for allowing pharmacy to be a part of this patient's care.  Hart Robinsons, PharmD Clinical Pharmacist 09/28/2015,12:04 PM

## 2015-09-29 DIAGNOSIS — Z7901 Long term (current) use of anticoagulants: Secondary | ICD-10-CM | POA: Diagnosis not present

## 2015-09-29 DIAGNOSIS — E785 Hyperlipidemia, unspecified: Secondary | ICD-10-CM | POA: Diagnosis not present

## 2015-09-29 DIAGNOSIS — I10 Essential (primary) hypertension: Secondary | ICD-10-CM | POA: Diagnosis not present

## 2015-09-29 DIAGNOSIS — I482 Chronic atrial fibrillation: Secondary | ICD-10-CM | POA: Diagnosis not present

## 2015-09-29 DIAGNOSIS — I5031 Acute diastolic (congestive) heart failure: Secondary | ICD-10-CM | POA: Diagnosis not present

## 2015-10-02 DIAGNOSIS — I482 Chronic atrial fibrillation: Secondary | ICD-10-CM | POA: Diagnosis not present

## 2015-10-02 DIAGNOSIS — I5031 Acute diastolic (congestive) heart failure: Secondary | ICD-10-CM | POA: Diagnosis not present

## 2015-10-02 DIAGNOSIS — I10 Essential (primary) hypertension: Secondary | ICD-10-CM | POA: Diagnosis not present

## 2015-10-02 DIAGNOSIS — E785 Hyperlipidemia, unspecified: Secondary | ICD-10-CM | POA: Diagnosis not present

## 2015-10-02 DIAGNOSIS — Z7901 Long term (current) use of anticoagulants: Secondary | ICD-10-CM | POA: Diagnosis not present

## 2015-10-03 DIAGNOSIS — E782 Mixed hyperlipidemia: Secondary | ICD-10-CM | POA: Diagnosis not present

## 2015-10-03 DIAGNOSIS — I5031 Acute diastolic (congestive) heart failure: Secondary | ICD-10-CM | POA: Diagnosis not present

## 2015-10-03 DIAGNOSIS — I350 Nonrheumatic aortic (valve) stenosis: Secondary | ICD-10-CM | POA: Diagnosis not present

## 2015-10-03 DIAGNOSIS — I10 Essential (primary) hypertension: Secondary | ICD-10-CM | POA: Diagnosis not present

## 2015-10-03 DIAGNOSIS — Z7901 Long term (current) use of anticoagulants: Secondary | ICD-10-CM | POA: Diagnosis not present

## 2015-10-03 DIAGNOSIS — I482 Chronic atrial fibrillation: Secondary | ICD-10-CM | POA: Diagnosis not present

## 2015-10-03 DIAGNOSIS — R7301 Impaired fasting glucose: Secondary | ICD-10-CM | POA: Diagnosis not present

## 2015-10-03 DIAGNOSIS — E785 Hyperlipidemia, unspecified: Secondary | ICD-10-CM | POA: Diagnosis not present

## 2015-10-04 DIAGNOSIS — I5031 Acute diastolic (congestive) heart failure: Secondary | ICD-10-CM | POA: Diagnosis not present

## 2015-10-04 DIAGNOSIS — Z7901 Long term (current) use of anticoagulants: Secondary | ICD-10-CM | POA: Diagnosis not present

## 2015-10-04 DIAGNOSIS — I10 Essential (primary) hypertension: Secondary | ICD-10-CM | POA: Diagnosis not present

## 2015-10-04 DIAGNOSIS — E785 Hyperlipidemia, unspecified: Secondary | ICD-10-CM | POA: Diagnosis not present

## 2015-10-04 DIAGNOSIS — I482 Chronic atrial fibrillation: Secondary | ICD-10-CM | POA: Diagnosis not present

## 2015-10-05 ENCOUNTER — Telehealth: Payer: Self-pay | Admitting: Cardiology

## 2015-10-05 DIAGNOSIS — E785 Hyperlipidemia, unspecified: Secondary | ICD-10-CM | POA: Diagnosis not present

## 2015-10-05 DIAGNOSIS — I5031 Acute diastolic (congestive) heart failure: Secondary | ICD-10-CM | POA: Diagnosis not present

## 2015-10-05 DIAGNOSIS — I10 Essential (primary) hypertension: Secondary | ICD-10-CM | POA: Diagnosis not present

## 2015-10-05 DIAGNOSIS — I482 Chronic atrial fibrillation: Secondary | ICD-10-CM | POA: Diagnosis not present

## 2015-10-05 DIAGNOSIS — Z7901 Long term (current) use of anticoagulants: Secondary | ICD-10-CM | POA: Diagnosis not present

## 2015-10-05 NOTE — Telephone Encounter (Signed)
PT was out today for visit with patient and said that patient wasn't able to do much activity because of being lightheaded and had an unsteady gait. Per home health physical therapist, patient c/o of dizziness and lightheadedness that was initially reported over the weekend. BP was 99/44 reported by staff during an after hours call. Today, BP was 106/70 sitting & 104/60 with  HR 44 at rest and after exertion HR 46. Patient also c/o intermittent left posterior neck pain on yesterday with  BP 122/80 & HR 62. On 10/04/15 HR was 62, on 10/03/15 HR 78 & BP 116/58 on 10/02/15 HR was 96. Patient is drinking and eating okay. PT did say she encouraged patient to increase her fluid intake. No c/o chest pain, n/v. Patient has also been sob since d/c from hospital but sob has not increased. Cardiac medications verified with PT while on phone.

## 2015-10-05 NOTE — Telephone Encounter (Signed)
Spoke with patient and she said that she didn't feel like she needed to go to the ED. Patient said that today she has felt worse since coming home from the hospital. Patient request to decrease medications. Patient advised to decrease atenolol to 25 mg and losartan to 50 mg daily. Patient advised that if her symptoms did not improve or got worse that she needed to go to the ED for an evaluation. Patient verbalized understanding of plan.

## 2015-10-05 NOTE — Telephone Encounter (Signed)
If there is any concern that her symptoms are more acute and progressing, evaluation in the ER would certainly be reasonable. If only other hand this does not look to be an acute decompensation, we can try and adjust some of her medications. She was just recently in the hospital with diastolic heart failure, seen by Dr.Koneswaran in consultation. If her blood pressure and heart rate are as low as stated, suggest cutting Cozaar to 50 mg daily and reducing atenolol to 25 mg daily.

## 2015-10-05 NOTE — Telephone Encounter (Signed)
[name] [name] is home taking physical therapy. Verl Dicker (therapist) called stating that patient is light headed, dizziness, shortness of breath. Please call [phone]

## 2015-10-06 ENCOUNTER — Encounter (INDEPENDENT_AMBULATORY_CARE_PROVIDER_SITE_OTHER): Payer: Self-pay

## 2015-10-09 ENCOUNTER — Ambulatory Visit: Payer: Medicare Other | Admitting: Adult Health

## 2015-10-09 DIAGNOSIS — I10 Essential (primary) hypertension: Secondary | ICD-10-CM | POA: Diagnosis not present

## 2015-10-09 DIAGNOSIS — I482 Chronic atrial fibrillation: Secondary | ICD-10-CM | POA: Diagnosis not present

## 2015-10-09 DIAGNOSIS — E785 Hyperlipidemia, unspecified: Secondary | ICD-10-CM | POA: Diagnosis not present

## 2015-10-09 DIAGNOSIS — I5031 Acute diastolic (congestive) heart failure: Secondary | ICD-10-CM | POA: Diagnosis not present

## 2015-10-09 DIAGNOSIS — Z7901 Long term (current) use of anticoagulants: Secondary | ICD-10-CM | POA: Diagnosis not present

## 2015-10-11 DIAGNOSIS — I10 Essential (primary) hypertension: Secondary | ICD-10-CM | POA: Diagnosis not present

## 2015-10-11 DIAGNOSIS — Z7901 Long term (current) use of anticoagulants: Secondary | ICD-10-CM | POA: Diagnosis not present

## 2015-10-11 DIAGNOSIS — I5031 Acute diastolic (congestive) heart failure: Secondary | ICD-10-CM | POA: Diagnosis not present

## 2015-10-11 DIAGNOSIS — E785 Hyperlipidemia, unspecified: Secondary | ICD-10-CM | POA: Diagnosis not present

## 2015-10-11 DIAGNOSIS — I482 Chronic atrial fibrillation: Secondary | ICD-10-CM | POA: Diagnosis not present

## 2015-10-12 DIAGNOSIS — I482 Chronic atrial fibrillation: Secondary | ICD-10-CM | POA: Diagnosis not present

## 2015-10-13 DIAGNOSIS — I10 Essential (primary) hypertension: Secondary | ICD-10-CM | POA: Diagnosis not present

## 2015-10-13 DIAGNOSIS — E785 Hyperlipidemia, unspecified: Secondary | ICD-10-CM | POA: Diagnosis not present

## 2015-10-13 DIAGNOSIS — I482 Chronic atrial fibrillation: Secondary | ICD-10-CM | POA: Diagnosis not present

## 2015-10-13 DIAGNOSIS — Z7901 Long term (current) use of anticoagulants: Secondary | ICD-10-CM | POA: Diagnosis not present

## 2015-10-13 DIAGNOSIS — I5031 Acute diastolic (congestive) heart failure: Secondary | ICD-10-CM | POA: Diagnosis not present

## 2015-10-16 DIAGNOSIS — E785 Hyperlipidemia, unspecified: Secondary | ICD-10-CM | POA: Diagnosis not present

## 2015-10-16 DIAGNOSIS — I5031 Acute diastolic (congestive) heart failure: Secondary | ICD-10-CM | POA: Diagnosis not present

## 2015-10-16 DIAGNOSIS — I10 Essential (primary) hypertension: Secondary | ICD-10-CM | POA: Diagnosis not present

## 2015-10-16 DIAGNOSIS — Z7901 Long term (current) use of anticoagulants: Secondary | ICD-10-CM | POA: Diagnosis not present

## 2015-10-16 DIAGNOSIS — I482 Chronic atrial fibrillation: Secondary | ICD-10-CM | POA: Diagnosis not present

## 2015-10-17 DIAGNOSIS — E785 Hyperlipidemia, unspecified: Secondary | ICD-10-CM | POA: Diagnosis not present

## 2015-10-17 DIAGNOSIS — I482 Chronic atrial fibrillation: Secondary | ICD-10-CM | POA: Diagnosis not present

## 2015-10-17 DIAGNOSIS — Z7901 Long term (current) use of anticoagulants: Secondary | ICD-10-CM | POA: Diagnosis not present

## 2015-10-17 DIAGNOSIS — I5031 Acute diastolic (congestive) heart failure: Secondary | ICD-10-CM | POA: Diagnosis not present

## 2015-10-17 DIAGNOSIS — I10 Essential (primary) hypertension: Secondary | ICD-10-CM | POA: Diagnosis not present

## 2015-10-18 DIAGNOSIS — I350 Nonrheumatic aortic (valve) stenosis: Secondary | ICD-10-CM | POA: Diagnosis not present

## 2015-10-19 DIAGNOSIS — C44319 Basal cell carcinoma of skin of other parts of face: Secondary | ICD-10-CM | POA: Diagnosis not present

## 2015-10-19 DIAGNOSIS — C4442 Squamous cell carcinoma of skin of scalp and neck: Secondary | ICD-10-CM | POA: Diagnosis not present

## 2015-10-20 DIAGNOSIS — I482 Chronic atrial fibrillation: Secondary | ICD-10-CM | POA: Diagnosis not present

## 2015-10-20 DIAGNOSIS — I5031 Acute diastolic (congestive) heart failure: Secondary | ICD-10-CM | POA: Diagnosis not present

## 2015-10-20 DIAGNOSIS — Z7901 Long term (current) use of anticoagulants: Secondary | ICD-10-CM | POA: Diagnosis not present

## 2015-10-20 DIAGNOSIS — E785 Hyperlipidemia, unspecified: Secondary | ICD-10-CM | POA: Diagnosis not present

## 2015-10-20 DIAGNOSIS — I10 Essential (primary) hypertension: Secondary | ICD-10-CM | POA: Diagnosis not present

## 2015-10-23 DIAGNOSIS — I10 Essential (primary) hypertension: Secondary | ICD-10-CM | POA: Diagnosis not present

## 2015-10-23 DIAGNOSIS — Z7901 Long term (current) use of anticoagulants: Secondary | ICD-10-CM | POA: Diagnosis not present

## 2015-10-23 DIAGNOSIS — I482 Chronic atrial fibrillation: Secondary | ICD-10-CM | POA: Diagnosis not present

## 2015-10-23 DIAGNOSIS — I5031 Acute diastolic (congestive) heart failure: Secondary | ICD-10-CM | POA: Diagnosis not present

## 2015-10-23 DIAGNOSIS — E785 Hyperlipidemia, unspecified: Secondary | ICD-10-CM | POA: Diagnosis not present

## 2015-10-25 DIAGNOSIS — Z7901 Long term (current) use of anticoagulants: Secondary | ICD-10-CM | POA: Diagnosis not present

## 2015-10-25 DIAGNOSIS — E785 Hyperlipidemia, unspecified: Secondary | ICD-10-CM | POA: Diagnosis not present

## 2015-10-25 DIAGNOSIS — I10 Essential (primary) hypertension: Secondary | ICD-10-CM | POA: Diagnosis not present

## 2015-10-25 DIAGNOSIS — I482 Chronic atrial fibrillation: Secondary | ICD-10-CM | POA: Diagnosis not present

## 2015-10-25 DIAGNOSIS — I5031 Acute diastolic (congestive) heart failure: Secondary | ICD-10-CM | POA: Diagnosis not present

## 2015-10-26 ENCOUNTER — Encounter: Payer: Self-pay | Admitting: Cardiology

## 2015-10-26 ENCOUNTER — Ambulatory Visit (INDEPENDENT_AMBULATORY_CARE_PROVIDER_SITE_OTHER): Payer: Medicare Other | Admitting: Cardiology

## 2015-10-26 VITALS — BP 163/77 | HR 66 | Ht 66.0 in | Wt 167.8 lb

## 2015-10-26 DIAGNOSIS — I482 Chronic atrial fibrillation: Secondary | ICD-10-CM | POA: Diagnosis not present

## 2015-10-26 DIAGNOSIS — I5032 Chronic diastolic (congestive) heart failure: Secondary | ICD-10-CM | POA: Diagnosis not present

## 2015-10-26 DIAGNOSIS — I2510 Atherosclerotic heart disease of native coronary artery without angina pectoris: Secondary | ICD-10-CM | POA: Diagnosis not present

## 2015-10-26 DIAGNOSIS — I481 Persistent atrial fibrillation: Secondary | ICD-10-CM | POA: Diagnosis not present

## 2015-10-26 DIAGNOSIS — I4819 Other persistent atrial fibrillation: Secondary | ICD-10-CM

## 2015-10-26 NOTE — Patient Instructions (Signed)
Your physician recommends that you schedule a follow-up appointment in: 3 months with Dr. McDowell  Your physician recommends that you continue on your current medications as directed. Please refer to the Current Medication list given to you today.  Thank you for choosing [facility]!!    

## 2015-10-26 NOTE — Progress Notes (Signed)
Cardiology Office Note  Date: 10/26/2015   ID: [name] [name], DOB 08/16/1930, MRN [id number]  PCP: Gar Ponto, MD  Primary Cardiologist: Rozann Lesches, MD   Chief Complaint  Patient presents with  . Hospitalization Follow-up    History of Present Illness: [name] [name] is an 82 y.o. female last seen in December 2016. I reviewed her interval records. She was admitted to [facility] in February with acute diastolic heart failure, seen by Dr. Bronson Ing. There was no clear evidence of ACS with normal troponin I levels. Follow-up echocardiogram revealed LVEF 60-65% with evidence of elevated LV filling pressure, also mild to moderate aortic stenosis. She was managed with IV diuresis. Telephone notes reviewed. She was having trouble with dizziness and shortness of breath, noted to be relatively hypotensive and bradycardic resulting in changes in her medication doses.  She comes in today stating that she feels better, no longer having dizziness. Her blood pressure was actually elevated when she came in, but earlier today her systolic was [id number] by report. She reports chronic dyspnea on exertion, NYHA class 2-3. This is likely multifactorial in the setting of diastolic heart failure, hypertension, atrial fibrillation, and also mild to moderate aortic stenosis. We discussed all these issues today.  She reports stable weights at home, fluctuating by no more than a pound in 24 hours. We talked about using extra 20 mg to 40 mg Lasix if her weight went up 2-3 pounds in 24 hours.  Past Medical History  Diagnosis Date  . Polymyalgia rheumatica ([place])   . Stroke ([place])   . Essential hypertension, benign   . Hyperlipidemia   . Paroxysmal atrial fibrillation (HCC)     On Coumadin  . Coronary atherosclerosis of native coronary artery     Nonobstructive at cardiac catheterization 2004  . Dysphagia     Current Outpatient Prescriptions  Medication Sig Dispense Refill  . acetaminophen (TYLENOL) 500 MG  tablet Take 1,000 mg by mouth every 6 (six) hours as needed.    Marland Kitchen atenolol (TENORMIN) 50 MG tablet Take 25 mg by mouth every evening.     Marland Kitchen atorvastatin (LIPITOR) 40 MG tablet Take 40 mg by mouth at bedtime.     . Biotin 1000 MCG tablet Take 1,000 mcg by mouth daily.    . Calcium Carbonate-Vitamin D (CALTRATE 600+D) 600-400 MG-UNIT per tablet Take 1 tablet by mouth daily.      . citalopram (CELEXA) 20 MG tablet Take 20 mg by mouth daily.    Marland Kitchen dicyclomine (BENTYL) 10 MG capsule Take 1 capsule (10 mg total) by mouth daily before breakfast. 30 capsule 3  . diltiazem (CARDIZEM CD) 240 MG 24 hr capsule Take 240 mg by mouth daily.    . furosemide (LASIX) 40 MG tablet Take 1 tablet (40 mg total) by mouth daily. 30 tablet 1  . losartan (COZAAR) 100 MG tablet Take 50 mg by mouth every evening.     . predniSONE (DELTASONE) 5 MG tablet Take 5 mg by mouth daily.    Marland Kitchen warfarin (COUMADIN) 4 MG tablet Take 2-4 mg by mouth daily. Alternate taking 4mg  every evening with 2mg  every evening     No current facility-administered medications for this visit.   Allergies:  Sulfonamide derivatives   Social History: The patient  reports that she has never smoked. She has never used smokeless tobacco. She reports that she does not drink alcohol or use illicit drugs.   ROS:  Please see the history of present  illness. Otherwise, complete review of systems is positive for chronic dyspnea on exertion, spinal stenosis with chronic back pain.  All other systems are reviewed and negative.   Physical Exam: VS:  BP 163/77 mmHg  Pulse 66  Ht 5\' 6"  (1.676 m)  Wt 167 lb 12.8 oz (76.114 kg)  BMI 27.10 kg/m2  SpO2 98%, BMI Body mass index is 27.1 kg/(m^2).  Wt Readings from Last 3 Encounters:  10/26/15 167 lb 12.8 oz (76.114 kg)  09/28/15 157 lb 12.8 oz (71.578 kg)  09/19/15 167 lb 1.6 oz (75.796 kg)    General: Patient appears comfortable at rest. HEENT: Conjunctiva and lids normal, oropharynx clear with moist  mucosa. Neck: Supple, no elevated JVP or carotid bruits, no thyromegaly. Lungs: Clear to auscultation, nonlabored breathing at rest. Cardiac: RRR, no S3, 2/6 systolic murmur, no pericardial rub. Abdomen: Soft, nontender, no hepatomegaly, bowel sounds present, no guarding or rebound. Extremities: Mild ankle edema, distal pulses 2+.  ECG: I personally reviewed the tracing from 09/24/2015 which showed rate-controlled atrial fibrillation with left bundle branch block.  Recent Labwork: 09/24/2015: ALT 14; AST 19; B Natriuretic Peptide 519.0* 09/25/2015: TSH 0.548 09/27/2015: BUN 20; Creatinine, Ser 1.12*; Hemoglobin 13.1; Magnesium 1.9; Platelets 256; Potassium 3.0*; Sodium 136     Component Value Date/Time   CHOL 190 12/24/2011 0545   TRIG 139 12/24/2011 0545   HDL 36* 12/24/2011 0545   CHOLHDL 5.3 12/24/2011 0545   VLDL 28 12/24/2011 0545   LDLCALC 126* 12/24/2011 0545    Other Studies Reviewed Today:  Lexiscan Cardiolite 04/26/2015:  There was no ST segment deviation noted during stress.  The study is normal.  This is a low risk study.  Nuclear stress EF: 62%.  Echocardiogram 09/25/2015: Study Conclusions  - Left ventricle: The cavity size was normal. Wall thickness was  increased in a pattern of mild LVH. Systolic function was normal.  The estimated ejection fraction was in the range of 60% to 65%.  Wall motion was normal; there were no regional wall motion  abnormalities. The study was not technically sufficient to allow  evaluation of LV diastolic dysfunction due to atrial  fibrillation. Doppler parameters are consistent with high  ventricular filling pressure. - Aortic valve: Mildly calcified annulus. Mildly thickened,  moderately calcified leaflets. Morphologically, there appears to  be at least mild, if not mild to moderate aortic valvular  stenosis. - Mitral valve: Calcified annulus. There was mild regurgitation. - Left atrium: The atrium was severely  dilated. - Tricuspid valve: There was mild regurgitation.  Chest x-ray 09/28/2015: FINDINGS: The cardiac silhouette is upper limits of normal in size. The patient has taken a greater inspiration than on the prior study and there is improved aeration of the lung bases. No airspace consolidation, edema, pleural effusion, or pneumothorax is seen. No acute osseous abnormality is identified.  IMPRESSION: Improved aeration of the lung bases. No evidence of active cardiopulmonary disease.  Assessment and Plan:  1. Chronic diastolic heart failure, complicated by hypertension, atrial fibrillation, and mild to moderate aortic stenosis. We will continue medical therapy including current Lasix dose, although we did discuss taking an additional 20-40 mg on days if her weight goes up 2-3 pounds in 24 hours.  2. History of nonobstructive CAD, no active angina symptoms, and negative Cardiolite from last year.  3. Persistent atrial fibrillation, continues on Coumadin, Tenormin, and Cardizem CD.  Current medicines were reviewed with the patient today.  Disposition: FU with me in 3 months.   Signed,  Satira Sark, MD, [place] 10/26/2015 4:31 PM    [facility], [place], [place] [address] Phone: [phone]; Fax: [phone]

## 2015-10-28 DIAGNOSIS — I5033 Acute on chronic diastolic (congestive) heart failure: Secondary | ICD-10-CM | POA: Diagnosis not present

## 2015-10-28 DIAGNOSIS — I482 Chronic atrial fibrillation: Secondary | ICD-10-CM | POA: Diagnosis not present

## 2015-10-30 ENCOUNTER — Ambulatory Visit (INDEPENDENT_AMBULATORY_CARE_PROVIDER_SITE_OTHER): Payer: Medicare Other | Admitting: Adult Health

## 2015-10-30 ENCOUNTER — Encounter: Payer: Self-pay | Admitting: Adult Health

## 2015-10-30 VITALS — BP 140/58 | HR 76 | Resp 20 | Ht 66.0 in | Wt 166.0 lb

## 2015-10-30 DIAGNOSIS — Z8673 Personal history of transient ischemic attack (TIA), and cerebral infarction without residual deficits: Secondary | ICD-10-CM

## 2015-10-30 DIAGNOSIS — R269 Unspecified abnormalities of gait and mobility: Secondary | ICD-10-CM | POA: Diagnosis not present

## 2015-10-30 DIAGNOSIS — I2510 Atherosclerotic heart disease of native coronary artery without angina pectoris: Secondary | ICD-10-CM

## 2015-10-30 NOTE — Patient Instructions (Signed)
Continue Coumadin Blood Pressure <130/90 Cholesterol LDL <100 If your symptoms worsen or you develop new symptoms please let us know.
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
## 2015-10-31 DIAGNOSIS — I5031 Acute diastolic (congestive) heart failure: Secondary | ICD-10-CM | POA: Diagnosis not present

## 2015-10-31 DIAGNOSIS — I10 Essential (primary) hypertension: Secondary | ICD-10-CM | POA: Diagnosis not present

## 2015-10-31 DIAGNOSIS — Z7901 Long term (current) use of anticoagulants: Secondary | ICD-10-CM | POA: Diagnosis not present

## 2015-10-31 DIAGNOSIS — I482 Chronic atrial fibrillation: Secondary | ICD-10-CM | POA: Diagnosis not present

## 2015-10-31 DIAGNOSIS — E785 Hyperlipidemia, unspecified: Secondary | ICD-10-CM | POA: Diagnosis not present

## 2015-11-02 DIAGNOSIS — I482 Chronic atrial fibrillation: Secondary | ICD-10-CM | POA: Diagnosis not present

## 2015-11-02 DIAGNOSIS — I5031 Acute diastolic (congestive) heart failure: Secondary | ICD-10-CM | POA: Diagnosis not present

## 2015-11-02 DIAGNOSIS — I10 Essential (primary) hypertension: Secondary | ICD-10-CM | POA: Diagnosis not present

## 2015-11-02 DIAGNOSIS — Z7901 Long term (current) use of anticoagulants: Secondary | ICD-10-CM | POA: Diagnosis not present

## 2015-11-02 DIAGNOSIS — E785 Hyperlipidemia, unspecified: Secondary | ICD-10-CM | POA: Diagnosis not present

## 2015-11-07 DIAGNOSIS — I5031 Acute diastolic (congestive) heart failure: Secondary | ICD-10-CM | POA: Diagnosis not present

## 2015-11-07 DIAGNOSIS — I10 Essential (primary) hypertension: Secondary | ICD-10-CM | POA: Diagnosis not present

## 2015-11-07 DIAGNOSIS — E785 Hyperlipidemia, unspecified: Secondary | ICD-10-CM | POA: Diagnosis not present

## 2015-11-07 DIAGNOSIS — I482 Chronic atrial fibrillation: Secondary | ICD-10-CM | POA: Diagnosis not present

## 2015-11-07 DIAGNOSIS — Z7901 Long term (current) use of anticoagulants: Secondary | ICD-10-CM | POA: Diagnosis not present

## 2015-11-08 DIAGNOSIS — E785 Hyperlipidemia, unspecified: Secondary | ICD-10-CM | POA: Diagnosis not present

## 2015-11-08 DIAGNOSIS — I5031 Acute diastolic (congestive) heart failure: Secondary | ICD-10-CM | POA: Diagnosis not present

## 2015-11-08 DIAGNOSIS — I482 Chronic atrial fibrillation: Secondary | ICD-10-CM | POA: Diagnosis not present

## 2015-11-08 DIAGNOSIS — Z7901 Long term (current) use of anticoagulants: Secondary | ICD-10-CM | POA: Diagnosis not present

## 2015-11-08 DIAGNOSIS — I10 Essential (primary) hypertension: Secondary | ICD-10-CM | POA: Diagnosis not present

## 2015-11-09 DIAGNOSIS — E785 Hyperlipidemia, unspecified: Secondary | ICD-10-CM | POA: Diagnosis not present

## 2015-11-09 DIAGNOSIS — Z7901 Long term (current) use of anticoagulants: Secondary | ICD-10-CM | POA: Diagnosis not present

## 2015-11-09 DIAGNOSIS — I5031 Acute diastolic (congestive) heart failure: Secondary | ICD-10-CM | POA: Diagnosis not present

## 2015-11-09 DIAGNOSIS — I482 Chronic atrial fibrillation: Secondary | ICD-10-CM | POA: Diagnosis not present

## 2015-11-09 DIAGNOSIS — I10 Essential (primary) hypertension: Secondary | ICD-10-CM | POA: Diagnosis not present

## 2015-11-21 ENCOUNTER — Encounter (INDEPENDENT_AMBULATORY_CARE_PROVIDER_SITE_OTHER): Payer: Self-pay | Admitting: Internal Medicine

## 2015-11-21 ENCOUNTER — Ambulatory Visit (INDEPENDENT_AMBULATORY_CARE_PROVIDER_SITE_OTHER): Payer: Medicare Other | Admitting: Internal Medicine

## 2015-11-21 VITALS — BP 140/80 | HR 62 | Temp 97.5°F | Resp 18 | Ht 66.5 in | Wt 167.9 lb

## 2015-11-21 DIAGNOSIS — K529 Noninfective gastroenteritis and colitis, unspecified: Secondary | ICD-10-CM | POA: Diagnosis not present

## 2015-11-21 DIAGNOSIS — I2510 Atherosclerotic heart disease of native coronary artery without angina pectoris: Secondary | ICD-10-CM

## 2015-11-21 NOTE — Patient Instructions (Signed)
Can use Glycerin and/or Dulcolax suppositories if you become constipated. Please do not take laxative by mouth

## 2015-11-21 NOTE — Progress Notes (Signed)
Presenting complaint;  Follow-up for chronic diarrhea/IBS.  Subjective:  Patient is 82 year old Caucasian female who is here for scheduled visit. She has chronic diarrhea felt to be due to IBS. She was last seen about 9 weeks ago. She states she is doing very well except this morning she had loose stool. On most days she has one formed stool. Occasionally she may have to. On one occasion she thought she was constipated but did finally have a bowel movement. She denies abdominal pain or rectal bleeding. She also denies melena. Her appetite is good and her weight has been stable. She was advised by Dr. Quillian Quince to undergo colonoscopy because of family history of CRC in mother and a brother she is decided not to go future exams unless she has symptoms. Last colonoscopy was 7 years ago. She is not having any side effects with dicyclomine.   Current Medications: Outpatient Encounter Prescriptions as of 11/21/2015  Medication Sig  . acetaminophen (TYLENOL) 500 MG tablet Take 1,000 mg by mouth every 6 (six) hours as needed.  Marland Kitchen atenolol (TENORMIN) 50 MG tablet Take 25 mg by mouth every evening.   Marland Kitchen atorvastatin (LIPITOR) 40 MG tablet Take 40 mg by mouth at bedtime.   . Biotin 1000 MCG tablet Take 1,000 mcg by mouth daily.  . Calcium Carbonate-Vitamin D (CALTRATE 600+D) 600-400 MG-UNIT per tablet Take 1 tablet by mouth daily.    . citalopram (CELEXA) 20 MG tablet Take 20 mg by mouth daily.  Marland Kitchen dicyclomine (BENTYL) 10 MG capsule Take 1 capsule (10 mg total) by mouth daily before breakfast.  . diltiazem (CARDIZEM CD) 240 MG 24 hr capsule Take 240 mg by mouth daily.  Marland Kitchen FIBER PO Take 3 g by mouth daily.  . furosemide (LASIX) 40 MG tablet Take 1 tablet (40 mg total) by mouth daily.  Marland Kitchen losartan (COZAAR) 100 MG tablet Take 50 mg by mouth every evening.   . predniSONE (DELTASONE) 5 MG tablet Take 5 mg by mouth daily.  Marland Kitchen warfarin (COUMADIN) 4 MG tablet Take 2-4 mg by mouth daily. Alternate taking 4mg  every evening  with 2mg  every evening   No facility-administered encounter medications on file as of 11/21/2015.     Objective: Blood pressure 140/80, pulse 62, temperature 97.5 F (36.4 C), temperature source Oral, resp. rate 18, height 5' 6.5" (1.689 m), weight 167 lb 14.4 oz (76.159 kg). Patient is alert and in no acute distress. Conjunctiva is pink. Sclera is nonicteric Oropharyngeal mucosa is normal. No neck masses or thyromegaly noted. Cardiac exam with regular rhythm normal S1 and S2. No murmur or gallop noted. Lungs are clear to auscultation. Abdomen is symmetrical with scarring right low quadrant. She has small umbilicus hernia. Bowel sounds are normal. On palpation abdomen is soft and nontender without organomegaly or masses. No LE edema or clubbing noted.  Labs/studies Results: Colonoscopy on 11/18/2001 revealed sigmoid colon diverticulosis(Dr. Lindalou Hose). Colonoscopy on 11/03/2008 revealed sigmoid colon diverticulosis(Dr. Lindalou Hose)  Assessment:  #1. Chronic diarrhea felt to be due to IBS. She has responded nicely to therapy. She is not having any side effects with low-dose dicyclomine. #2. Family history of colon carcinoma. Her mother was diagnosed with CRC in her 45s and brother was diagnosed at age 64. Patient has had 2 colonoscopies by Dr. Shara Blazing and 2010). She is not interested in having colonoscopy unless she has symptoms or rectal bleeding.   Plan:  Continue fiber supplement and dicyclomine at current dose. Patient advised not to take laxative by mouth she  becomes constipated but she can use Glycerin and are Dulcolax suppository on as-needed basis. Office visit in one year.

## 2015-11-27 DIAGNOSIS — I482 Chronic atrial fibrillation: Secondary | ICD-10-CM | POA: Diagnosis not present

## 2015-12-25 DIAGNOSIS — I482 Chronic atrial fibrillation: Secondary | ICD-10-CM | POA: Diagnosis not present

## 2016-01-02 DIAGNOSIS — I350 Nonrheumatic aortic (valve) stenosis: Secondary | ICD-10-CM | POA: Diagnosis not present

## 2016-01-02 DIAGNOSIS — I482 Chronic atrial fibrillation: Secondary | ICD-10-CM | POA: Diagnosis not present

## 2016-01-09 ENCOUNTER — Ambulatory Visit (INDEPENDENT_AMBULATORY_CARE_PROVIDER_SITE_OTHER): Payer: Medicare Other | Admitting: Internal Medicine

## 2016-01-16 ENCOUNTER — Other Ambulatory Visit (INDEPENDENT_AMBULATORY_CARE_PROVIDER_SITE_OTHER): Payer: Self-pay | Admitting: Internal Medicine

## 2016-01-16 DIAGNOSIS — I482 Chronic atrial fibrillation: Secondary | ICD-10-CM | POA: Diagnosis not present

## 2016-01-18 DIAGNOSIS — M47816 Spondylosis without myelopathy or radiculopathy, lumbar region: Secondary | ICD-10-CM | POA: Diagnosis not present

## 2016-01-18 DIAGNOSIS — M545 Low back pain: Secondary | ICD-10-CM | POA: Diagnosis not present

## 2016-01-18 DIAGNOSIS — G8929 Other chronic pain: Secondary | ICD-10-CM | POA: Diagnosis not present

## 2016-01-26 ENCOUNTER — Ambulatory Visit (INDEPENDENT_AMBULATORY_CARE_PROVIDER_SITE_OTHER): Payer: Medicare Other | Admitting: Cardiology

## 2016-01-26 ENCOUNTER — Encounter: Payer: Self-pay | Admitting: Cardiology

## 2016-01-26 VITALS — BP 138/68 | HR 71 | Ht 66.0 in | Wt 167.8 lb

## 2016-01-26 DIAGNOSIS — I10 Essential (primary) hypertension: Secondary | ICD-10-CM

## 2016-01-26 DIAGNOSIS — I480 Paroxysmal atrial fibrillation: Secondary | ICD-10-CM

## 2016-01-26 DIAGNOSIS — I5032 Chronic diastolic (congestive) heart failure: Secondary | ICD-10-CM

## 2016-01-26 DIAGNOSIS — I482 Chronic atrial fibrillation: Secondary | ICD-10-CM | POA: Diagnosis not present

## 2016-01-26 DIAGNOSIS — I2510 Atherosclerotic heart disease of native coronary artery without angina pectoris: Secondary | ICD-10-CM

## 2016-01-26 DIAGNOSIS — I350 Nonrheumatic aortic (valve) stenosis: Secondary | ICD-10-CM | POA: Diagnosis not present

## 2016-01-26 DIAGNOSIS — E782 Mixed hyperlipidemia: Secondary | ICD-10-CM | POA: Diagnosis not present

## 2016-01-26 DIAGNOSIS — N183 Chronic kidney disease, stage 3 (moderate): Secondary | ICD-10-CM | POA: Diagnosis not present

## 2016-01-26 DIAGNOSIS — R7301 Impaired fasting glucose: Secondary | ICD-10-CM | POA: Diagnosis not present

## 2016-01-26 DIAGNOSIS — E8881 Metabolic syndrome: Secondary | ICD-10-CM | POA: Diagnosis not present

## 2016-01-26 DIAGNOSIS — K210 Gastro-esophageal reflux disease with esophagitis: Secondary | ICD-10-CM | POA: Diagnosis not present

## 2016-01-26 NOTE — Patient Instructions (Signed)
Your physician recommends that you schedule a follow-up appointment in: 4 months with Domenic Polite   Your physician recommends that you continue on your current medications as directed. Please refer to the Current Medication list given to you today.  Thank you for choosing [facility]!!

## 2016-01-26 NOTE — Progress Notes (Signed)
Cardiology Office Note  Date: 01/26/2016   ID: [name], DOB Mar 06, 1930, MRN [id number]  PCP: Gar Ponto, MD  Primary Cardiologist: Rozann Lesches, MD   Chief Complaint  Patient presents with  . Diastolic heart failure  . Atrial Fibrillation    History of Present Illness: [name] [name] is an 82 y.o. female last seen in March. She presents for a routine follow-up visit. Overall no change in status since we last met. Her weight is stable. She does not report any progressive orthopnea or leg edema.   I reviewed her medications which are outlined below. Cardiac regimen includes Coumadin, Cozaar, Lasix, Cardizem CD, atenolol, and Lipitor. Dr. Quillian Quince adjusts her Coumadin.  Past Medical History  Diagnosis Date  . Polymyalgia rheumatica ([place])   . Stroke ([place])   . Essential hypertension, benign   . Hyperlipidemia   . Paroxysmal atrial fibrillation (H[facility])     On Coumadin  . Coronary atherosclerosis of native coronary artery     Nonobstructive at cardiac catheterization 2004  . Dysphagia     Current Outpatient Prescriptions  Medication Sig Dispense Refill  . acetaminophen (TYLENOL) 500 MG tablet Take 1,000 mg by mouth every 6 (six) hours as needed.    Marland Kitchen atenolol (TENORMIN) 50 MG tablet Take 25 mg by mouth every evening.     Marland Kitchen atorvastatin (LIPITOR) 40 MG tablet Take 40 mg by mouth at bedtime.     . Biotin 1000 MCG tablet Take 1,000 mcg by mouth daily.    . Calcium Carbonate-Vitamin D (CALTRATE 600+D) 600-400 MG-UNIT per tablet Take 1 tablet by mouth daily.      . citalopram (CELEXA) 20 MG tablet Take 20 mg by mouth daily.    Marland Kitchen dicyclomine (BENTYL) 10 MG capsule TAKE 1 CAPSULE BY MOUTH EVERY MORNING BEFORE BREAKFAST 30 capsule 5  . diltiazem (CARDIZEM CD) 240 MG 24 hr capsule Take 240 mg by mouth daily.    Marland Kitchen FIBER PO Take 3 g by mouth daily.    . furosemide (LASIX) 40 MG tablet Take 1 tablet (40 mg total) by mouth daily. 30 tablet 1  . losartan (COZAAR) 100 MG tablet Take  50 mg by mouth every evening.     . predniSONE (DELTASONE) 5 MG tablet Take 5 mg by mouth daily.    Marland Kitchen warfarin (COUMADIN) 4 MG tablet Take 2-4 mg by mouth daily. Alternate taking 24m every evening with 22mevery evening     No current facility-administered medications for this visit.   Allergies:  Sulfonamide derivatives   Social History: The patient  reports that she has never smoked. She has never used smokeless tobacco. She reports that she does not drink alcohol or use illicit drugs.   ROS:  Please see the history of present illness. Otherwise, complete review of systems is positive for some trouble with her memory.  All other systems are reviewed and negative.   Physical Exam: VS:  BP 138/68 mmHg  Pulse 71  Ht '5\' 6"'  (1.676 m)  Wt 167 lb 12.8 oz (76.114 kg)  BMI 27.10 kg/m2  SpO2 98%, BMI Body mass index is 27.1 kg/(m^2).  Wt Readings from Last 3 Encounters:  01/26/16 167 lb 12.8 oz (76.114 kg)  11/21/15 167 lb 14.4 oz (76.159 kg)  10/30/15 166 lb (75.297 kg)    General: Patient appears comfortable at rest. HEENT: Conjunctiva and lids normal, oropharynx clear with moist mucosa. Neck: Supple, no elevated JVP or carotid bruits, no thyromegaly. Lungs: Clear  to auscultation, nonlabored breathing at rest. Cardiac: RRR, no S3, 2/6 systolic murmur, no pericardial rub. Abdomen: Soft, nontender, no hepatomegaly, bowel sounds present, no guarding or rebound. Extremities: Mild ankle edema, distal pulses 2+.  ECG: I personally reviewed the tracing from 09/24/2015 which showed atrial fibrillation with left bundle branch block.  Recent Labwork: 09/24/2015: ALT 14; AST 19; B Natriuretic Peptide 519.0* 09/25/2015: TSH 0.548 09/27/2015: BUN 20; Creatinine, Ser 1.12*; Hemoglobin 13.1; Magnesium 1.9; Platelets 256; Potassium 3.0*; Sodium 136   Other Studies Reviewed Today:  Lexiscan Cardiolite 04/26/2015:  There was no ST segment deviation noted during stress.  The study is normal.  This is a  low risk study.  Nuclear stress EF: 62%.  Echocardiogram 09/25/2015: Study Conclusions  - Left ventricle: The cavity size was normal. Wall thickness was  increased in a pattern of mild LVH. Systolic function was normal.  The estimated ejection fraction was in the range of 60% to 65%.  Wall motion was normal; there were no regional wall motion  abnormalities. The study was not technically sufficient to allow  evaluation of LV diastolic dysfunction due to atrial  fibrillation. Doppler parameters are consistent with high  ventricular filling pressure. - Aortic valve: Mildly calcified annulus. Mildly thickened,  moderately calcified leaflets. Morphologically, there appears to  be at least mild, if not mild to moderate aortic valvular  stenosis. - Mitral valve: Calcified annulus. There was mild regurgitation. - Left atrium: The atrium was severely dilated. - Tricuspid valve: There was mild regurgitation.  Assessment and Plan:  1. Paroxysmal atrial fibrillation. Continue current medical therapy including atenolol, Cardizem CD, and Coumadin.  2. Chronic diastolic heart failure, no changes in overall clinical status with stable weight. Continue present diuretic dose. May adjust upward for increases in weight as already discussed.  3. Essential hypertension, blood pressure is adequately controlled.  Current medicines were reviewed with the patient today.  Disposition: FU with me in 4 months.   Signed, Satira Sark, MD, [facility] 01/26/2016 2:55 PM    [facility] [place], [place], [place] [address] Phone: [phone]; Fax: [phone]

## 2016-02-07 DIAGNOSIS — M5416 Radiculopathy, lumbar region: Secondary | ICD-10-CM | POA: Diagnosis not present

## 2016-02-14 DIAGNOSIS — I5032 Chronic diastolic (congestive) heart failure: Secondary | ICD-10-CM | POA: Diagnosis not present

## 2016-02-14 DIAGNOSIS — I482 Chronic atrial fibrillation: Secondary | ICD-10-CM | POA: Diagnosis not present

## 2016-02-14 DIAGNOSIS — M545 Low back pain: Secondary | ICD-10-CM | POA: Diagnosis not present

## 2016-02-14 DIAGNOSIS — E782 Mixed hyperlipidemia: Secondary | ICD-10-CM | POA: Diagnosis not present

## 2016-02-14 DIAGNOSIS — I10 Essential (primary) hypertension: Secondary | ICD-10-CM | POA: Diagnosis not present

## 2016-02-14 DIAGNOSIS — F331 Major depressive disorder, recurrent, moderate: Secondary | ICD-10-CM | POA: Diagnosis not present

## 2016-02-14 DIAGNOSIS — E8881 Metabolic syndrome: Secondary | ICD-10-CM | POA: Diagnosis not present

## 2016-02-14 DIAGNOSIS — K219 Gastro-esophageal reflux disease without esophagitis: Secondary | ICD-10-CM | POA: Diagnosis not present

## 2016-02-24 DIAGNOSIS — M47816 Spondylosis without myelopathy or radiculopathy, lumbar region: Secondary | ICD-10-CM | POA: Diagnosis not present

## 2016-02-24 DIAGNOSIS — M545 Low back pain: Secondary | ICD-10-CM | POA: Diagnosis not present

## 2016-02-24 DIAGNOSIS — M5136 Other intervertebral disc degeneration, lumbar region: Secondary | ICD-10-CM | POA: Diagnosis not present

## 2016-02-24 DIAGNOSIS — G8929 Other chronic pain: Secondary | ICD-10-CM | POA: Diagnosis not present

## 2016-02-29 ENCOUNTER — Ambulatory Visit (INDEPENDENT_AMBULATORY_CARE_PROVIDER_SITE_OTHER): Payer: Medicare Other | Admitting: Ophthalmology

## 2016-02-29 DIAGNOSIS — I10 Essential (primary) hypertension: Secondary | ICD-10-CM | POA: Diagnosis not present

## 2016-02-29 DIAGNOSIS — H33313 Horseshoe tear of retina without detachment, bilateral: Secondary | ICD-10-CM | POA: Diagnosis not present

## 2016-02-29 DIAGNOSIS — H35033 Hypertensive retinopathy, bilateral: Secondary | ICD-10-CM

## 2016-02-29 DIAGNOSIS — H43813 Vitreous degeneration, bilateral: Secondary | ICD-10-CM

## 2016-02-29 DIAGNOSIS — H353121 Nonexudative age-related macular degeneration, left eye, early dry stage: Secondary | ICD-10-CM

## 2016-03-15 DIAGNOSIS — I482 Chronic atrial fibrillation: Secondary | ICD-10-CM | POA: Diagnosis not present

## 2016-03-15 DIAGNOSIS — I350 Nonrheumatic aortic (valve) stenosis: Secondary | ICD-10-CM | POA: Diagnosis not present

## 2016-04-09 DIAGNOSIS — Z6826 Body mass index (BMI) 26.0-26.9, adult: Secondary | ICD-10-CM | POA: Diagnosis not present

## 2016-04-09 DIAGNOSIS — L509 Urticaria, unspecified: Secondary | ICD-10-CM | POA: Diagnosis not present

## 2016-04-11 DIAGNOSIS — M3210 Systemic lupus erythematosus, organ or system involvement unspecified: Secondary | ICD-10-CM | POA: Diagnosis not present

## 2016-04-11 DIAGNOSIS — I10 Essential (primary) hypertension: Secondary | ICD-10-CM | POA: Diagnosis not present

## 2016-04-11 DIAGNOSIS — Z6826 Body mass index (BMI) 26.0-26.9, adult: Secondary | ICD-10-CM | POA: Diagnosis not present

## 2016-04-11 DIAGNOSIS — L500 Allergic urticaria: Secondary | ICD-10-CM | POA: Diagnosis not present

## 2016-04-15 DIAGNOSIS — I350 Nonrheumatic aortic (valve) stenosis: Secondary | ICD-10-CM | POA: Diagnosis not present

## 2016-04-16 DIAGNOSIS — L500 Allergic urticaria: Secondary | ICD-10-CM | POA: Diagnosis not present

## 2016-04-16 DIAGNOSIS — Z6826 Body mass index (BMI) 26.0-26.9, adult: Secondary | ICD-10-CM | POA: Diagnosis not present

## 2016-04-26 DIAGNOSIS — Z6826 Body mass index (BMI) 26.0-26.9, adult: Secondary | ICD-10-CM | POA: Diagnosis not present

## 2016-04-26 DIAGNOSIS — L500 Allergic urticaria: Secondary | ICD-10-CM | POA: Diagnosis not present

## 2016-04-29 DIAGNOSIS — R300 Dysuria: Secondary | ICD-10-CM | POA: Diagnosis not present

## 2016-04-29 DIAGNOSIS — N3000 Acute cystitis without hematuria: Secondary | ICD-10-CM | POA: Diagnosis not present

## 2016-04-29 DIAGNOSIS — R05 Cough: Secondary | ICD-10-CM | POA: Diagnosis not present

## 2016-04-29 DIAGNOSIS — R0602 Shortness of breath: Secondary | ICD-10-CM | POA: Diagnosis not present

## 2016-04-29 DIAGNOSIS — Z6826 Body mass index (BMI) 26.0-26.9, adult: Secondary | ICD-10-CM | POA: Diagnosis not present

## 2016-05-06 DIAGNOSIS — I482 Chronic atrial fibrillation: Secondary | ICD-10-CM | POA: Diagnosis not present

## 2016-05-06 DIAGNOSIS — I350 Nonrheumatic aortic (valve) stenosis: Secondary | ICD-10-CM | POA: Diagnosis not present

## 2016-05-20 DIAGNOSIS — I482 Chronic atrial fibrillation: Secondary | ICD-10-CM | POA: Diagnosis not present

## 2016-05-29 ENCOUNTER — Encounter: Payer: Self-pay | Admitting: *Deleted

## 2016-05-30 ENCOUNTER — Ambulatory Visit (INDEPENDENT_AMBULATORY_CARE_PROVIDER_SITE_OTHER): Payer: Medicare Other | Admitting: Cardiology

## 2016-05-30 ENCOUNTER — Encounter: Payer: Self-pay | Admitting: Cardiology

## 2016-05-30 VITALS — BP 134/65 | HR 59 | Ht 66.0 in | Wt 175.8 lb

## 2016-05-30 DIAGNOSIS — I481 Persistent atrial fibrillation: Secondary | ICD-10-CM

## 2016-05-30 DIAGNOSIS — I10 Essential (primary) hypertension: Secondary | ICD-10-CM | POA: Diagnosis not present

## 2016-05-30 DIAGNOSIS — I2510 Atherosclerotic heart disease of native coronary artery without angina pectoris: Secondary | ICD-10-CM

## 2016-05-30 DIAGNOSIS — I4819 Other persistent atrial fibrillation: Secondary | ICD-10-CM

## 2016-05-30 NOTE — Patient Instructions (Signed)
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
## 2016-06-01 DIAGNOSIS — R0781 Pleurodynia: Secondary | ICD-10-CM | POA: Diagnosis not present

## 2016-06-01 DIAGNOSIS — S0083XA Contusion of other part of head, initial encounter: Secondary | ICD-10-CM | POA: Diagnosis not present

## 2016-06-01 DIAGNOSIS — I4891 Unspecified atrial fibrillation: Secondary | ICD-10-CM | POA: Diagnosis not present

## 2016-06-01 DIAGNOSIS — Z8249 Family history of ischemic heart disease and other diseases of the circulatory system: Secondary | ICD-10-CM | POA: Diagnosis not present

## 2016-06-01 DIAGNOSIS — R51 Headache: Secondary | ICD-10-CM | POA: Diagnosis not present

## 2016-06-01 DIAGNOSIS — Z79899 Other long term (current) drug therapy: Secondary | ICD-10-CM | POA: Diagnosis not present

## 2016-06-01 DIAGNOSIS — R220 Localized swelling, mass and lump, head: Secondary | ICD-10-CM | POA: Diagnosis not present

## 2016-06-01 DIAGNOSIS — I69851 Hemiplegia and hemiparesis following other cerebrovascular disease affecting right dominant side: Secondary | ICD-10-CM | POA: Diagnosis not present

## 2016-06-01 DIAGNOSIS — S20211A Contusion of right front wall of thorax, initial encounter: Secondary | ICD-10-CM | POA: Diagnosis not present

## 2016-06-01 DIAGNOSIS — Z7901 Long term (current) use of anticoagulants: Secondary | ICD-10-CM | POA: Diagnosis not present

## 2016-06-01 DIAGNOSIS — W108XXA Fall (on) (from) other stairs and steps, initial encounter: Secondary | ICD-10-CM | POA: Diagnosis not present

## 2016-06-01 DIAGNOSIS — I10 Essential (primary) hypertension: Secondary | ICD-10-CM | POA: Diagnosis not present

## 2016-06-01 DIAGNOSIS — S199XXA Unspecified injury of neck, initial encounter: Secondary | ICD-10-CM | POA: Diagnosis not present

## 2016-06-01 DIAGNOSIS — E7800 Pure hypercholesterolemia, unspecified: Secondary | ICD-10-CM | POA: Diagnosis not present

## 2016-06-01 DIAGNOSIS — M1990 Unspecified osteoarthritis, unspecified site: Secondary | ICD-10-CM | POA: Diagnosis not present

## 2016-06-07 DIAGNOSIS — E8881 Metabolic syndrome: Secondary | ICD-10-CM | POA: Diagnosis not present

## 2016-06-07 DIAGNOSIS — E039 Hypothyroidism, unspecified: Secondary | ICD-10-CM | POA: Diagnosis not present

## 2016-06-07 DIAGNOSIS — I350 Nonrheumatic aortic (valve) stenosis: Secondary | ICD-10-CM | POA: Diagnosis not present

## 2016-06-07 DIAGNOSIS — E782 Mixed hyperlipidemia: Secondary | ICD-10-CM | POA: Diagnosis not present

## 2016-06-07 DIAGNOSIS — I482 Chronic atrial fibrillation: Secondary | ICD-10-CM | POA: Diagnosis not present

## 2016-06-07 DIAGNOSIS — I10 Essential (primary) hypertension: Secondary | ICD-10-CM | POA: Diagnosis not present

## 2016-06-11 DIAGNOSIS — I5032 Chronic diastolic (congestive) heart failure: Secondary | ICD-10-CM | POA: Diagnosis not present

## 2016-06-11 DIAGNOSIS — E782 Mixed hyperlipidemia: Secondary | ICD-10-CM | POA: Diagnosis not present

## 2016-06-11 DIAGNOSIS — I10 Essential (primary) hypertension: Secondary | ICD-10-CM | POA: Diagnosis not present

## 2016-06-11 DIAGNOSIS — I482 Chronic atrial fibrillation: Secondary | ICD-10-CM | POA: Diagnosis not present

## 2016-06-11 DIAGNOSIS — F331 Major depressive disorder, recurrent, moderate: Secondary | ICD-10-CM | POA: Diagnosis not present

## 2016-06-11 DIAGNOSIS — Z23 Encounter for immunization: Secondary | ICD-10-CM | POA: Diagnosis not present

## 2016-06-11 DIAGNOSIS — E8881 Metabolic syndrome: Secondary | ICD-10-CM | POA: Diagnosis not present

## 2016-06-11 DIAGNOSIS — Z6827 Body mass index (BMI) 27.0-27.9, adult: Secondary | ICD-10-CM | POA: Diagnosis not present

## 2016-06-17 DIAGNOSIS — I482 Chronic atrial fibrillation: Secondary | ICD-10-CM | POA: Diagnosis not present

## 2016-06-17 DIAGNOSIS — I350 Nonrheumatic aortic (valve) stenosis: Secondary | ICD-10-CM | POA: Diagnosis not present

## 2016-06-17 DIAGNOSIS — M546 Pain in thoracic spine: Secondary | ICD-10-CM | POA: Diagnosis not present

## 2016-06-19 DIAGNOSIS — R0602 Shortness of breath: Secondary | ICD-10-CM | POA: Diagnosis not present

## 2016-06-19 DIAGNOSIS — R072 Precordial pain: Secondary | ICD-10-CM | POA: Diagnosis not present

## 2016-06-24 DIAGNOSIS — I482 Chronic atrial fibrillation: Secondary | ICD-10-CM | POA: Diagnosis not present

## 2016-06-24 DIAGNOSIS — I350 Nonrheumatic aortic (valve) stenosis: Secondary | ICD-10-CM | POA: Diagnosis not present
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
## 2016-06-25 ENCOUNTER — Ambulatory Visit (INDEPENDENT_AMBULATORY_CARE_PROVIDER_SITE_OTHER): Payer: Medicare Other | Admitting: Cardiology

## 2016-06-25 ENCOUNTER — Encounter: Payer: Self-pay | Admitting: Cardiology

## 2016-06-25 VITALS — BP 150/80 | HR 74 | Wt 173.0 lb

## 2016-06-25 DIAGNOSIS — I2510 Atherosclerotic heart disease of native coronary artery without angina pectoris: Secondary | ICD-10-CM | POA: Diagnosis not present

## 2016-06-25 DIAGNOSIS — M546 Pain in thoracic spine: Secondary | ICD-10-CM | POA: Diagnosis not present

## 2016-06-25 DIAGNOSIS — I10 Essential (primary) hypertension: Secondary | ICD-10-CM

## 2016-06-25 DIAGNOSIS — I481 Persistent atrial fibrillation: Secondary | ICD-10-CM | POA: Diagnosis not present

## 2016-06-25 DIAGNOSIS — R0602 Shortness of breath: Secondary | ICD-10-CM | POA: Diagnosis not present

## 2016-06-25 DIAGNOSIS — I4819 Other persistent atrial fibrillation: Secondary | ICD-10-CM

## 2016-06-25 NOTE — Patient Instructions (Addendum)
Medication Instructions:  Continue all current medications.  Labwork: none  Testing/Procedures:  Your physician has requested that you have an echocardiogram. Echocardiography is a painless test that uses sound waves to create images of your heart. It provides your doctor with information about the size and shape of your heart and how well your heart's chambers and valves are working. This procedure takes approximately one hour. There are no restrictions for this procedure.  Office will contact with results via phone or letter.    Follow-Up: To be determined.    Any Other Special Instructions Will Be Listed Below (If Applicable).  If you need a refill on your cardiac medications before your next appointment, please call your pharmacy.  

## 2016-06-25 NOTE — Addendum Note (Signed)
Addended by: Laurine Blazer on: 06/25/2016 02:58 PM   Modules accepted: Orders

## 2016-06-25 NOTE — Addendum Note (Signed)
Addended by: Laurine Blazer on: 06/25/2016 04:06 PM   Modules accepted: Orders

## 2016-06-26 ENCOUNTER — Other Ambulatory Visit: Payer: Self-pay

## 2016-06-26 ENCOUNTER — Telehealth: Payer: Self-pay | Admitting: Cardiology

## 2016-06-26 ENCOUNTER — Ambulatory Visit (INDEPENDENT_AMBULATORY_CARE_PROVIDER_SITE_OTHER): Payer: Medicare Other

## 2016-06-26 DIAGNOSIS — R0602 Shortness of breath: Secondary | ICD-10-CM | POA: Diagnosis not present

## 2016-06-26 NOTE — Telephone Encounter (Signed)
Please advise on Baclofen and Tramadol pt concerned with taking new meds with cardiac meds

## 2016-06-26 NOTE — Telephone Encounter (Signed)
Would go ahead with Dr. Ar[place] Jansky advice.

## 2016-06-26 NOTE — Telephone Encounter (Signed)
Dr Quillian Quince called patient last night and told her to start taking the following medication. Baclofen 10mg  -  1 tab 3 times a day Tylenol 650 mg Tramadol HcL 50 mg -4 times a day  She would like to know if she needs to do as he says

## 2016-06-26 NOTE — Telephone Encounter (Signed)
Pt aware.

## 2016-07-01 ENCOUNTER — Telehealth: Payer: Self-pay | Admitting: *Deleted

## 2016-07-01 NOTE — Telephone Encounter (Signed)
-----   Message from Satira Sark, MD sent at 06/26/2016  4:48 PM EST ----- Results reviewed. Please let her know that her heart function remains stable, no change in LVEF or valvular status. Also no pericardial effusion to suggest inflammation of the pericardium. I see that Dr. Quillian Quince has started her on some new medications. May want to consider follow-up with him for a chest CT if her symptoms persist. A copy of this test should be forwarded to Gar Ponto, MD.

## 2016-07-02 NOTE — Telephone Encounter (Signed)
Pt aware routed to Dr. Quillian Quince

## 2016-07-05 ENCOUNTER — Emergency Department (HOSPITAL_COMMUNITY): Payer: Medicare Other

## 2016-07-05 ENCOUNTER — Emergency Department (HOSPITAL_COMMUNITY)
Admission: EM | Admit: 2016-07-05 | Discharge: 2016-07-05 | Disposition: A | Discharge status: Home / Self Care | Payer: Medicare Other | Attending: Emergency Medicine | Admitting: Emergency Medicine

## 2016-07-05 ENCOUNTER — Encounter (HOSPITAL_COMMUNITY): Payer: Self-pay | Admitting: Emergency Medicine

## 2016-07-05 DIAGNOSIS — J029 Acute pharyngitis, unspecified: Secondary | ICD-10-CM | POA: Insufficient documentation

## 2016-07-05 DIAGNOSIS — I5031 Acute diastolic (congestive) heart failure: Secondary | ICD-10-CM | POA: Insufficient documentation

## 2016-07-05 DIAGNOSIS — Z7901 Long term (current) use of anticoagulants: Secondary | ICD-10-CM | POA: Insufficient documentation

## 2016-07-05 DIAGNOSIS — R39198 Other difficulties with micturition: Secondary | ICD-10-CM | POA: Insufficient documentation

## 2016-07-05 DIAGNOSIS — I110 Hypertensive heart disease with heart failure: Secondary | ICD-10-CM | POA: Insufficient documentation

## 2016-07-05 DIAGNOSIS — M6281 Muscle weakness (generalized): Secondary | ICD-10-CM | POA: Insufficient documentation

## 2016-07-05 DIAGNOSIS — I2510 Atherosclerotic heart disease of native coronary artery without angina pectoris: Secondary | ICD-10-CM | POA: Insufficient documentation

## 2016-07-05 DIAGNOSIS — J449 Chronic obstructive pulmonary disease, unspecified: Secondary | ICD-10-CM | POA: Diagnosis not present

## 2016-07-05 DIAGNOSIS — Z79899 Other long term (current) drug therapy: Secondary | ICD-10-CM | POA: Diagnosis not present

## 2016-07-05 DIAGNOSIS — R6883 Chills (without fever): Secondary | ICD-10-CM | POA: Diagnosis not present

## 2016-07-05 LAB — URINALYSIS, ROUTINE W REFLEX MICROSCOPIC
Bilirubin Urine: NEGATIVE
Glucose, UA: NEGATIVE mg/dL
Hgb urine dipstick: NEGATIVE
KETONES UR: NEGATIVE mg/dL
NITRITE: NEGATIVE
PH: 6 (ref 5.0–8.0)
PROTEIN: NEGATIVE mg/dL
Specific Gravity, Urine: 1.01 (ref 1.005–1.030)

## 2016-07-05 LAB — COMPREHENSIVE METABOLIC PANEL
ALBUMIN: 4.6 g/dL (ref 3.5–5.0)
ALT: 29 U/L (ref 14–54)
AST: 31 U/L (ref 15–41)
Alkaline Phosphatase: 103 U/L (ref 38–126)
Anion gap: 13 (ref 5–15)
BILIRUBIN TOTAL: 0.9 mg/dL (ref 0.3–1.2)
BUN: 17 mg/dL (ref 6–20)
CHLORIDE: 99 mmol/L — AB (ref 101–111)
CO2: 24 mmol/L (ref 22–32)
Calcium: 9.5 mg/dL (ref 8.9–10.3)
Creatinine, Ser: 1.05 mg/dL — ABNORMAL HIGH (ref 0.44–1.00)
GFR calc Af Amer: 55 mL/min — ABNORMAL LOW (ref >60)
GFR calc non Af Amer: 47 mL/min — ABNORMAL LOW (ref >60)
GLUCOSE: 139 mg/dL — AB (ref 65–99)
POTASSIUM: 3.5 mmol/L (ref 3.5–5.1)
Sodium: 136 mmol/L (ref 135–145)
Total Protein: 8.2 g/dL — ABNORMAL HIGH (ref 6.5–8.1)

## 2016-07-05 LAB — RAPID STREP SCREEN (MED CTR MEBANE ONLY): STREPTOCOCCUS, GROUP A SCREEN (DIRECT): NEGATIVE

## 2016-07-05 LAB — CBC WITH DIFFERENTIAL/PLATELET
Basophils Absolute: 0 10*3/uL (ref 0.0–0.1)
Basophils Relative: 0 %
EOS PCT: 0 %
Eosinophils Absolute: 0 10*3/uL (ref 0.0–0.7)
HCT: 41.7 % (ref 36.0–46.0)
Hemoglobin: 14.1 g/dL (ref 12.0–15.0)
LYMPHS ABS: 2.1 10*3/uL (ref 0.7–4.0)
LYMPHS PCT: 20 %
MCH: 30.9 pg (ref 26.0–34.0)
MCHC: 33.8 g/dL (ref 30.0–36.0)
MCV: 91.4 fL (ref 78.0–100.0)
MONO ABS: 0.4 10*3/uL (ref 0.1–1.0)
MONOS PCT: 4 %
Neutro Abs: 7.9 10*3/uL — ABNORMAL HIGH (ref 1.7–7.7)
Neutrophils Relative %: 76 %
PLATELETS: 336 10*3/uL (ref 150–400)
RBC: 4.56 MIL/uL (ref 3.87–5.11)
RDW: 13.8 % (ref 11.5–15.5)
WBC: 10.5 10*3/uL (ref 4.0–10.5)

## 2016-07-05 LAB — PROTIME-INR
INR: 2.24
PROTHROMBIN TIME: 25.2 s — AB (ref 11.4–15.2)

## 2016-07-05 LAB — INFLUENZA PANEL BY PCR (TYPE A & B)
Influenza A By PCR: NEGATIVE
Influenza B By PCR: NEGATIVE

## 2016-07-05 LAB — URINE MICROSCOPIC-ADD ON: RBC / HPF: NONE SEEN RBC/hpf (ref 0–5)

## 2016-07-05 LAB — LACTIC ACID, PLASMA: LACTIC ACID, VENOUS: 1.7 mmol/L (ref 0.5–1.9)

## 2016-07-05 NOTE — ED Notes (Signed)
Chills, slight sore throat, not feeling well since lunch, feels exhausted, frequency urine with small amounts.

## 2016-07-05 NOTE — Discharge Instructions (Signed)
Take your usual prescriptions as previously directed. Take over the counter tylenol, as directed on packaging, as needed for discomfort. Keep yourself hydrated. Gargle with warm water several times per day to help with discomfort.  Call your regular medical doctor on Monday to schedule a follow up appointment next week.  Return to the Emergency Department immediately sooner if worsening.

## 2016-07-05 NOTE — ED Provider Notes (Signed)
[facility] DEPT Provider Note   CSN: [id number] Arrival date & time: 07/05/16  1812     History   Chief Complaint Chief Complaint  Patient presents with  . Chills    HPI [name] [name] is a 82 y.o. female.     Pt was seen at [facility].  Per pt and her husband, c/o gradual onset and persistence of constantly "not feeling well" since noon today. Pt states today at noon she began to feel "chills," generalized body aches, generalized weakness/fatigue, sore throat, and "urinating small amounts." Denies CP/palpitations, no SOB/cough, no abd pain, no N/V/D, no back pain, no rash, no objective fevers.   Past Medical History:  Diagnosis Date  . Coronary atherosclerosis of native coronary artery    Nonobstructive at cardiac catheterization 2004  . Dysphagia   . Essential hypertension, benign   . Hyperlipidemia   . Paroxysmal atrial fibrillation (HCC)    On Coumadin  . Polymyalgia rheumatica ([place])   . Stroke [facility])     Patient Active Problem List   Diagnosis Date Noted  . Hypokalemia   . Hypoxia   . SOB (shortness of breath)   . Acute bronchitis   . Atypical chest pain 09/25/2015  . Acute diastolic CHF (congestive heart failure) ([place]) 12/29/2014  . Acute diastolic congestive heart failure ([place]) 12/29/2014  . Dyspnea   . Chronic diarrhea 07/04/2014  . Coronary atherosclerosis of native coronary artery 03/28/2014  . Long term (current) use of anticoagulants 11/26/2010  . HYPERLIPIDEMIA 05/11/2007  . Essential hypertension, benign 05/11/2007  . Chronic atrial fibrillation ([place]) 05/11/2007    Past Surgical History:  Procedure Laterality Date  . APPENDECTOMY    . COLONOSCOPY  2008   Dr.Fleshiman  . Left ankle    . TOE SURGERY    . TOTAL ABDOMINAL HYSTERECTOMY      OB History    Gravida Para Term Preterm AB Living             4   SAB TAB Ectopic Multiple Live Births                   Home Medications    Prior to Admission medications   Medication Sig Start Date  End Date Taking? Authorizing Provider  acetaminophen (TYLENOL) 500 MG tablet Take 1,000 mg by mouth every 6 (six) hours as needed.    Historical Provider, MD  atorvastatin (LIPITOR) 40 MG tablet Take 40 mg by mouth at bedtime.     Historical Provider, MD  Biotin 1000 MCG tablet Take 1,000 mcg by mouth daily.    Historical Provider, MD  Calcium Carbonate-Vitamin D (CALTRATE 600+D) 600-400 MG-UNIT per tablet Take 1 tablet by mouth daily.      Historical Provider, MD  citalopram (CELEXA) 20 MG tablet Take 20 mg by mouth daily.    Historical Provider, MD  diltiazem (CARDIZEM CD) 240 MG 24 hr capsule Take 240 mg by mouth daily.    Historical Provider, MD  FIBER PO Take 3 g by mouth daily.    Historical Provider, MD  furosemide (LASIX) 40 MG tablet Take 40 mg by mouth 2 (two) times daily. Per Dr Quillian Quince 06/18/16 lasix doubled    Historical Provider, MD  losartan (COZAAR) 100 MG tablet Take 50 mg by mouth every evening.     Historical Provider, MD  metoprolol tartrate (LOPRESSOR) 25 MG tablet Take 25 mg by mouth 2 (two) times daily.    Historical Provider, MD  potassium chloride (K-DUR,KLOR-CON) 10  MEQ tablet Take 10 mEq by mouth daily. Pt not sure dose    Historical Provider, MD  predniSONE (DELTASONE) 5 MG tablet Take 5 mg by mouth daily.    Historical Provider, MD  warfarin (COUMADIN) 4 MG tablet Take 2-4 mg by mouth daily. Alternate taking 4mg  every evening with 2mg  every evening    Historical Provider, MD    Family History Family History  Problem Relation Age of Onset  . Stroke Mother   . Hypertension Sister   . Hypertension Brother   . Rheum arthritis Daughter   . Healthy Son   . Healthy Son   . Healthy Son   . Hypertension Sister   . Heart disease Sister   . Heart disease Brother   . Hypertension Brother   . Arthritis Brother   . Colon cancer Brother   . Hypertension Brother   . Coronary artery disease Other     Social History Social History  Substance Use Topics  . Smoking  status: Never Smoker  . Smokeless tobacco: Never Used  . Alcohol use No     Allergies   Sulfonamide derivatives   Review of Systems Review of Systems ROS: Statement: All systems negative except as marked or noted in the HPI; Constitutional: Negative for objective fever and +chills, generalized weakness/fatigue, generalized body aches.. ; ; Eyes: Negative for eye pain, redness and discharge. ; ; ENMT: Negative for ear pain, hoarseness, nasal congestion, sinus pressure and +sore throat. ; ; Cardiovascular: Negative for chest pain, palpitations, diaphoresis, dyspnea and peripheral edema. ; ; Respiratory: Negative for cough, wheezing and stridor. ; ; Gastrointestinal: Negative for nausea, vomiting, diarrhea, abdominal pain, blood in stool, hematemesis, jaundice and rectal bleeding. . ; ; Genitourinary: +"urinating small amounts." Negative for dysuria, flank pain and hematuria. ; ; Musculoskeletal: Negative for back pain and neck pain. Negative for swelling and trauma.; ; Skin: Negative for pruritus, rash, abrasions, blisters, bruising and skin lesion.; ; Neuro: Negative for headache, lightheadedness and neck stiffness. Negative for altered level of consciousness, altered mental status, extremity weakness, paresthesias, involuntary movement, seizure and syncope.       Physical Exam Updated Vital Signs BP 170/91 (BP Location: Left Arm)   Pulse (!) 54   Temp 99.4 F (37.4 C) (Rectal)   Resp 18   Ht 5\' 6"  (1.676 m)   Wt 170 lb (77.1 kg)   SpO2 100%   BMI 27.44 kg/m   20:36:14 Orthostatic Vital Signs KB  Orthostatic Lying   BP- Lying: 142/71  Pulse- Lying: 76      Orthostatic Sitting  BP- Sitting: 144/83  Pulse- Sitting: 98      Orthostatic Standing at 0 minutes  BP- Standing at 0 minutes: 143/72  Pulse- Standing at 0 minutes: 79     Physical Exam 1845: Physical examination:  Nursing notes reviewed; Vital signs and O2 SAT reviewed;  Constitutional: Well developed, Well  nourished, Well hydrated, In no acute distress; Head:  Normocephalic, atraumatic; Eyes: EOMI, PERRL, No scleral icterus; ENMT: Mouth and pharynx normal, Mucous membranes moist; Neck: Supple, Full range of motion, No lymphadenopathy; Cardiovascular: Regular rate and rhythm, No gallop; Respiratory: Breath sounds clear & equal bilaterally, No wheezes.  Speaking full sentences with ease, Normal respiratory effort/excursion; Chest: Nontender, Movement normal; Abdomen: Soft, Nontender, Nondistended, Normal bowel sounds; Genitourinary: No CVA tenderness; Extremities: Pulses normal, No tenderness, No edema, No calf edema or asymmetry.; Neuro: AA&Ox3, Major CN grossly intact.  Speech clear. No gross focal motor deficits in  extremities.; Skin: Color normal, Warm, Dry.   ED Treatments / Results  Labs (all labs ordered are listed, but only abnormal results are displayed)   EKG  EKG Interpretation None       Radiology   Procedures Procedures (including critical care time)  Medications Ordered in ED Medications - No data to display   Initial Impression / Assessment and Plan / ED Course  I have reviewed the triage vital signs and the nursing notes.  Pertinent labs & imaging results that were available during my care of the patient were reviewed by me and considered in my medical decision making (see chart for details).  MDM Reviewed: previous chart, nursing note and vitals Reviewed previous: labs Interpretation: labs and x-ray    Results for orders placed or performed during the hospital encounter of 07/05/16  Rapid strep screen  Result Value Ref Range   Streptococcus, Group A Screen (Direct) NEGATIVE NEGATIVE  Urinalysis, Routine w reflex microscopic (not at [facility])  Result Value Ref Range   Color, Urine YELLOW YELLOW   APPearance CLEAR CLEAR   Specific Gravity, Urine 1.010 1.005 - 1.030   pH 6.0 5.0 - 8.0   Glucose, UA NEGATIVE NEGATIVE mg/dL   Hgb urine dipstick NEGATIVE NEGATIVE    Bilirubin Urine NEGATIVE NEGATIVE   Ketones, ur NEGATIVE NEGATIVE mg/dL   Protein, ur NEGATIVE NEGATIVE mg/dL   Nitrite NEGATIVE NEGATIVE   Leukocytes, UA TRACE (A) NEGATIVE  Comprehensive metabolic panel  Result Value Ref Range   Sodium 136 135 - 145 mmol/L   Potassium 3.5 3.5 - 5.1 mmol/L   Chloride 99 (L) 101 - 111 mmol/L   CO2 24 22 - 32 mmol/L   Glucose, Bld 139 (H) 65 - 99 mg/dL   BUN 17 6 - 20 mg/dL   Creatinine, Ser 1.05 (H) 0.44 - 1.00 mg/dL   Calcium 9.5 8.9 - 10.3 mg/dL   Total Protein 8.2 (H) 6.5 - 8.1 g/dL   Albumin 4.6 3.5 - 5.0 g/dL   AST 31 15 - 41 U/L   ALT 29 14 - 54 U/L   Alkaline Phosphatase 103 38 - 126 U/L   Total Bilirubin 0.9 0.3 - 1.2 mg/dL   GFR calc non Af Amer 47 (L) >60 mL/min   GFR calc Af Amer 55 (L) >60 mL/min   Anion gap 13 5 - 15  CBC with Differential  Result Value Ref Range   WBC 10.5 4.0 - 10.5 K/uL   RBC 4.56 3.87 - 5.11 MIL/uL   Hemoglobin 14.1 12.0 - 15.0 g/dL   HCT 41.7 36.0 - 46.0 %   MCV 91.4 78.0 - 100.0 fL   MCH 30.9 26.0 - 34.0 pg   MCHC 33.8 30.0 - 36.0 g/dL   RDW 13.8 11.5 - 15.5 %   Platelets 336 150 - 400 K/uL   Neutrophils Relative % 76 %   Neutro Abs 7.9 (H) 1.7 - 7.7 K/uL   Lymphocytes Relative 20 %   Lymphs Abs 2.1 0.7 - 4.0 K/uL   Monocytes Relative 4 %   Monocytes Absolute 0.4 0.1 - 1.0 K/uL   Eosinophils Relative 0 %   Eosinophils Absolute 0.0 0.0 - 0.7 K/uL   Basophils Relative 0 %   Basophils Absolute 0.0 0.0 - 0.1 K/uL  Lactic acid, plasma  Result Value Ref Range   Lactic Acid, Venous 1.7 0.5 - 1.9 mmol/L  Protime-INR  Result Value Ref Range   Prothrombin Time 25.2 (H) 11.4 - 15.2  seconds   INR 2.24   Influenza panel by PCR (type A & B, H1N1)  Result Value Ref Range   Influenza A By PCR NEGATIVE NEGATIVE   Influenza B By PCR NEGATIVE NEGATIVE  Urine microscopic-add on  Result Value Ref Range   Squamous Epithelial / LPF 0-5 (A) NONE SEEN   WBC, UA 0-5 0 - 5 WBC/hpf   RBC / HPF NONE SEEN 0 - 5  RBC/hpf   Bacteria, UA RARE (A) NONE SEEN   Dg Chest 2 View Result Date: 07/05/2016 CLINICAL DATA:  Chills, generalized body aches, fatigue. EXAM: CHEST  2 VIEW COMPARISON:  06/19/2016 FINDINGS: Mild hyperinflation/COPD. Heart is borderline in size. No confluent airspace opacities or effusions. No acute bony abnormality. IMPRESSION: Mild COPD.  No active disease. Electronically Signed   By: Rolm Baptise M.D.   On: 07/05/2016 19:31    2100:  Pt is not orthostatic on VS. Pt has ambulated with steady gait. VS remain stable. Workup is reassuring. No clear criteria to admit to the hospital at this time. Dx and testing d/w pt and family.  Questions answered.  Verb understanding, agreeable to d/c home with outpt f/u.   Final Clinical Impressions(s) / ED Diagnoses   Final diagnoses:  None    New Prescriptions New Prescriptions   No medications on file      Francine Graven, DO 07/08/16 2128

## 2016-07-05 NOTE — ED Triage Notes (Signed)
PT states chills, shaking and generalized body aches with fatigue x1 day. PT also states increased urinary frequency with little output today.

## 2016-07-08 LAB — URINE CULTURE: Culture: <10000 — AB

## 2016-07-09 LAB — CULTURE, GROUP A STREP (THRC)

## 2016-07-24 DIAGNOSIS — I350 Nonrheumatic aortic (valve) stenosis: Secondary | ICD-10-CM | POA: Diagnosis not present

## 2016-07-29 DIAGNOSIS — I350 Nonrheumatic aortic (valve) stenosis: Secondary | ICD-10-CM | POA: Diagnosis not present

## 2016-08-05 DIAGNOSIS — I350 Nonrheumatic aortic (valve) stenosis: Secondary | ICD-10-CM | POA: Diagnosis not present

## 2016-08-21 DIAGNOSIS — L570 Actinic keratosis: Secondary | ICD-10-CM | POA: Diagnosis not present

## 2016-08-21 DIAGNOSIS — Z85828 Personal history of other malignant neoplasm of skin: Secondary | ICD-10-CM | POA: Diagnosis not present

## 2016-09-10 DIAGNOSIS — E8881 Metabolic syndrome: Secondary | ICD-10-CM | POA: Diagnosis not present

## 2016-09-10 DIAGNOSIS — K529 Noninfective gastroenteritis and colitis, unspecified: Secondary | ICD-10-CM | POA: Diagnosis not present

## 2016-09-10 DIAGNOSIS — Z9189 Other specified personal risk factors, not elsewhere classified: Secondary | ICD-10-CM | POA: Diagnosis not present

## 2016-09-10 DIAGNOSIS — K210 Gastro-esophageal reflux disease with esophagitis: Secondary | ICD-10-CM | POA: Diagnosis not present

## 2016-09-10 DIAGNOSIS — I482 Chronic atrial fibrillation: Secondary | ICD-10-CM | POA: Diagnosis not present

## 2016-09-10 DIAGNOSIS — E782 Mixed hyperlipidemia: Secondary | ICD-10-CM | POA: Diagnosis not present

## 2016-09-10 DIAGNOSIS — I350 Nonrheumatic aortic (valve) stenosis: Secondary | ICD-10-CM | POA: Diagnosis not present

## 2016-09-10 DIAGNOSIS — I10 Essential (primary) hypertension: Secondary | ICD-10-CM | POA: Diagnosis not present

## 2016-09-10 DIAGNOSIS — R7301 Impaired fasting glucose: Secondary | ICD-10-CM | POA: Diagnosis not present

## 2016-09-11 DIAGNOSIS — K529 Noninfective gastroenteritis and colitis, unspecified: Secondary | ICD-10-CM | POA: Diagnosis not present

## 2016-09-13 DIAGNOSIS — N183 Chronic kidney disease, stage 3 (moderate): Secondary | ICD-10-CM | POA: Diagnosis not present

## 2016-09-13 DIAGNOSIS — E8881 Metabolic syndrome: Secondary | ICD-10-CM | POA: Diagnosis not present

## 2016-09-13 DIAGNOSIS — I5032 Chronic diastolic (congestive) heart failure: Secondary | ICD-10-CM | POA: Diagnosis not present

## 2016-09-13 DIAGNOSIS — I10 Essential (primary) hypertension: Secondary | ICD-10-CM | POA: Diagnosis not present

## 2016-09-13 DIAGNOSIS — I482 Chronic atrial fibrillation: Secondary | ICD-10-CM | POA: Diagnosis not present

## 2016-09-13 DIAGNOSIS — F331 Major depressive disorder, recurrent, moderate: Secondary | ICD-10-CM | POA: Diagnosis not present

## 2016-09-13 DIAGNOSIS — K219 Gastro-esophageal reflux disease without esophagitis: Secondary | ICD-10-CM | POA: Diagnosis not present

## 2016-09-13 DIAGNOSIS — E782 Mixed hyperlipidemia: Secondary | ICD-10-CM | POA: Diagnosis not present

## 2016-09-24 ENCOUNTER — Ambulatory Visit (INDEPENDENT_AMBULATORY_CARE_PROVIDER_SITE_OTHER): Payer: Medicare Other | Admitting: Internal Medicine

## 2016-09-24 ENCOUNTER — Encounter (INDEPENDENT_AMBULATORY_CARE_PROVIDER_SITE_OTHER): Payer: Self-pay

## 2016-09-24 ENCOUNTER — Encounter (INDEPENDENT_AMBULATORY_CARE_PROVIDER_SITE_OTHER): Payer: Self-pay | Admitting: Internal Medicine

## 2016-09-24 VITALS — BP 130/90 | HR 56 | Temp 98.1°F | Ht 66.0 in | Wt 173.2 lb

## 2016-09-24 DIAGNOSIS — K529 Noninfective gastroenteritis and colitis, unspecified: Secondary | ICD-10-CM | POA: Diagnosis not present

## 2016-09-24 MED ORDER — DICYCLOMINE HCL 10 MG PO CAPS: 10.0000 mg | ORAL_CAPSULE | Freq: Two times a day (BID) | ORAL | 3 refills | Status: DC

## 2016-09-24 NOTE — Progress Notes (Signed)
Subjective:    Patient ID: [name], female    DOB: March 28, 1930, 83 y.o.   MRN: [id number]  HPI Presents today with c/o she is having diarrhea and stomach cramps. She has had symptoms for about 3-4 weeks.  Two stools were negative.   Hx diarrhea in the past. Hx of IBS.  Has been taking Imodium which is not helping. She says all of her stools are loose. Averaging about 3-4 stools a day. She says everytime she eats, it goes straight thru her.  She says she has seen some blood and she thinks it is from hemorrhoids; Saw Dr. Laural Golden in January of this year for same complaint.   She was started on Dicyclomine and responded to therapy. She says the medication stopped working and she stopped taking.  Per Dr. Olevia Perches notes she was scheduled for a colonoscopy in 2015 but canceled the procedure.  No recent antibiotics   Hx of CVA in 2014 and maintained on Warfarin. Hx of atrial fib. Hx of CRC in mother and brother Review of Systems  Past Medical History:  Diagnosis Date  . Coronary atherosclerosis of native coronary artery    Nonobstructive at cardiac catheterization 2004  . Dysphagia   . Essential hypertension, benign   . Hyperlipidemia   . Paroxysmal atrial fibrillation (HCC)    On Coumadin  . Polymyalgia rheumatica ([place])   . Stroke [facility])     Past Surgical History:  Procedure Laterality Date  . APPENDECTOMY    . COLONOSCOPY  2008   Dr.Fleshiman  . Left ankle    . TOE SURGERY    . TOTAL ABDOMINAL HYSTERECTOMY      Allergies  Allergen Reactions  . Sulfonamide Derivatives Rash    Current Outpatient Prescriptions on File Prior to Visit  Medication Sig Dispense Refill  . acetaminophen (TYLENOL) 500 MG tablet Take 500 mg by mouth every 6 (six) hours as needed.     Marland Kitchen atenolol (TENORMIN) 50 MG tablet Take 50 mg by mouth at bedtime.    Marland Kitchen atorvastatin (LIPITOR) 40 MG tablet Take 40 mg by mouth at bedtime.     . Biotin 1000 MCG tablet Take 1,000 mcg by mouth daily.    . Calcium  Carbonate-Vitamin D (CALTRATE 600+D) 600-400 MG-UNIT per tablet Take 1 tablet by mouth daily.      . citalopram (CELEXA) 20 MG tablet Take 20 mg by mouth daily.    Marland Kitchen diltiazem (CARDIZEM CD) 240 MG 24 hr capsule Take 240 mg by mouth daily.    . furosemide (LASIX) 40 MG tablet Take 40 mg by mouth daily.     Marland Kitchen losartan (COZAAR) 100 MG tablet Take 50 mg by mouth every evening.     . predniSONE (DELTASONE) 5 MG tablet Take 5 mg by mouth daily.    Marland Kitchen warfarin (COUMADIN) 4 MG tablet Take 2-4 mg by mouth daily. Alternate taking 4mg  every evening with 2mg  every evening     No current facility-administered medications on file prior to visit.         Objective:   Physical Exam Blood pressure 130/90, pulse (!) 56, temperature 98.1 F (36.7 C), height 5\' 6"  (1.676 m), weight 173 lb 3.2 oz (78.6 kg).  Alert and oriented. Skin warm and dry. Oral mucosa is moist.   . Sclera anicteric, conjunctivae is pink. Thyroid not enlarged. No cervical lymphadenopathy. Lungs clear. Heart regular rate and rhythm.  Abdomen is soft. Bowel sounds are positive. No hepatomegaly. No abdominal  masses felt. No tenderness.  No edema to lower extremities.        Assessment & Plan:  Chronic diarrhea. Hx of same.  Suspect IBS.  Her last colooscopy in 2010. GI pathogen. Dicyclomine 10mg  twice a day.  Imodium as needed She is not interested in any further colonoscopies.

## 2016-09-24 NOTE — Patient Instructions (Signed)
Rx for Dicyclomine 10mg  twice a day GI pathogen

## 2016-10-01 DIAGNOSIS — I482 Chronic atrial fibrillation: Secondary | ICD-10-CM | POA: Diagnosis not present

## 2016-10-01 DIAGNOSIS — I350 Nonrheumatic aortic (valve) stenosis: Secondary | ICD-10-CM | POA: Diagnosis not present

## 2016-10-25 DIAGNOSIS — H35033 Hypertensive retinopathy, bilateral: Secondary | ICD-10-CM | POA: Diagnosis not present

## 2016-10-25 DIAGNOSIS — Z961 Presence of intraocular lens: Secondary | ICD-10-CM | POA: Diagnosis not present

## 2016-10-25 DIAGNOSIS — I10 Essential (primary) hypertension: Secondary | ICD-10-CM | POA: Diagnosis not present

## 2016-10-25 DIAGNOSIS — H524 Presbyopia: Secondary | ICD-10-CM | POA: Diagnosis not present

## 2016-10-25 DIAGNOSIS — Z9849 Cataract extraction status, unspecified eye: Secondary | ICD-10-CM | POA: Diagnosis not present

## 2016-10-25 DIAGNOSIS — H5203 Hypermetropia, bilateral: Secondary | ICD-10-CM | POA: Diagnosis not present

## 2016-10-25 DIAGNOSIS — H52223 Regular astigmatism, bilateral: Secondary | ICD-10-CM | POA: Diagnosis not present

## 2016-10-25 DIAGNOSIS — H43813 Vitreous degeneration, bilateral: Secondary | ICD-10-CM | POA: Diagnosis not present

## 2016-10-25 DIAGNOSIS — H353122 Nonexudative age-related macular degeneration, left eye, intermediate dry stage: Secondary | ICD-10-CM | POA: Diagnosis not present

## 2016-10-30 ENCOUNTER — Ambulatory Visit: Payer: Medicare Other | Admitting: Neurology

## 2016-10-30 DIAGNOSIS — I350 Nonrheumatic aortic (valve) stenosis: Secondary | ICD-10-CM | POA: Diagnosis not present

## 2016-10-30 DIAGNOSIS — I482 Chronic atrial fibrillation: Secondary | ICD-10-CM | POA: Diagnosis not present

## 2016-10-31 ENCOUNTER — Telehealth (INDEPENDENT_AMBULATORY_CARE_PROVIDER_SITE_OTHER): Payer: Self-pay | Admitting: Internal Medicine

## 2016-10-31 NOTE — Telephone Encounter (Signed)
Forwarded to [facility]

## 2016-10-31 NOTE — Telephone Encounter (Signed)
Deleted recall per Deberah Castle, NP

## 2016-10-31 NOTE — Telephone Encounter (Signed)
Cancel appt

## 2016-10-31 NOTE — Telephone Encounter (Signed)
[name] [name], this patient is on my recall list for a year follow up now. You just saw her in February.  If/when does she need to come back in?

## 2016-11-27 DIAGNOSIS — I350 Nonrheumatic aortic (valve) stenosis: Secondary | ICD-10-CM | POA: Diagnosis not present

## 2016-11-29 ENCOUNTER — Ambulatory Visit (INDEPENDENT_AMBULATORY_CARE_PROVIDER_SITE_OTHER): Payer: Medicare Other | Admitting: Cardiology

## 2016-11-29 ENCOUNTER — Encounter: Payer: Self-pay | Admitting: Cardiology

## 2016-11-29 VITALS — BP 138/64 | HR 70 | Ht 66.5 in | Wt 177.0 lb

## 2016-11-29 DIAGNOSIS — I482 Chronic atrial fibrillation, unspecified: Secondary | ICD-10-CM

## 2016-11-29 DIAGNOSIS — I350 Nonrheumatic aortic (valve) stenosis: Secondary | ICD-10-CM | POA: Diagnosis not present

## 2016-11-29 DIAGNOSIS — I2510 Atherosclerotic heart disease of native coronary artery without angina pectoris: Secondary | ICD-10-CM | POA: Diagnosis not present

## 2016-11-29 DIAGNOSIS — I10 Essential (primary) hypertension: Secondary | ICD-10-CM | POA: Diagnosis not present

## 2016-11-29 NOTE — Progress Notes (Signed)
Cardiology Office Note  Date: 11/29/2016   ID: [name], DOB Nov 13, 1929, MRN [id number]  PCP: Gar Ponto, MD  Primary Cardiologist: Rozann Lesches, MD   Chief Complaint  Patient presents with  . PAF    History of Present Illness: [name] is an 83 y.o. female last seen in November 2017. She presents for a routine follow-up visit. Since last encounter she has had no major change from a cardiac perspective, specifically no increasing palpitations or recurring exertional chest pain.  She continues on Coumadin and follows with Dr. Quillian Quince. She has not had any significant bleeding problems.  I reviewed her medications, current cardiac regimen includes atenolol, Lipitor, Cardizem CD, Lasix, Cozaar, and Coumadin.  Past Medical History:  Diagnosis Date  . Coronary atherosclerosis of native coronary artery    Nonobstructive at cardiac catheterization 2004  . Dysphagia   . Essential hypertension, benign   . Hyperlipidemia   . Paroxysmal atrial fibrillation (HCC)    On Coumadin  . Polymyalgia rheumatica ([place])   . Stroke [facility])     Current Outpatient Prescriptions  Medication Sig Dispense Refill  . acetaminophen (TYLENOL) 500 MG tablet Take 500 mg by mouth every 6 (six) hours as needed.     Marland Kitchen atenolol (TENORMIN) 50 MG tablet Take 50 mg by mouth at bedtime.    Marland Kitchen atorvastatin (LIPITOR) 40 MG tablet Take 40 mg by mouth at bedtime.     . Biotin 1000 MCG tablet Take 1,000 mcg by mouth daily.    . Calcium Carbonate-Vitamin D (CALTRATE 600+D) 600-400 MG-UNIT per tablet Take 1 tablet by mouth daily.      . citalopram (CELEXA) 20 MG tablet Take 20 mg by mouth daily.    Marland Kitchen dicyclomine (BENTYL) 10 MG capsule Take 1 capsule (10 mg total) by mouth 2 (two) times daily. 60 capsule 3  . diltiazem (CARDIZEM CD) 240 MG 24 hr capsule Take 240 mg by mouth daily.    . furosemide (LASIX) 40 MG tablet Take 40 mg by mouth daily.     Marland Kitchen losartan (COZAAR) 100 MG tablet Take 50 mg by mouth every  evening.     . predniSONE (DELTASONE) 5 MG tablet Take 5 mg by mouth daily.    Marland Kitchen warfarin (COUMADIN) 4 MG tablet Take 2-4 mg by mouth daily. Alternate taking 4mg  every evening with 2mg  every evening     No current facility-administered medications for this visit.    Allergies:  Sulfonamide derivatives   Social History: The patient  reports that she has never smoked. She has never used smokeless tobacco. She reports that she does not drink alcohol or use drugs.   ROS:  Please see the history of present illness. Otherwise, complete review of systems is positive for seasonal allergies.  All other systems are reviewed and negative.   Physical Exam: VS:  BP 138/64   Pulse 70   Ht 5' 6.5" (1.689 m)   Wt 177 lb (80.3 kg)   SpO2 94%   BMI 28.14 kg/m , BMI Body mass index is 28.14 kg/m.  Wt Readings from Last 3 Encounters:  11/29/16 177 lb (80.3 kg)  09/24/16 173 lb 3.2 oz (78.6 kg)  07/05/16 170 lb (77.1 kg)    General: Elderly woman in no distress. HEENT: Conjunctiva and lids normal, oropharynx clear. Neck: Supple, no elevated JVP or carotid bruits, no thyromegaly. Lungs: Decreased breath sounds at the bases, no rub, nonlabored breathing at rest. Cardiac: Irregularly irregular, no S3,  2/6 systolic murmur, no pericardial rub. Abdomen: Soft, nontender, bowel sounds present, no guarding or rebound. Extremities: Mild ankle edema, distal pulses 2+.  ECG: I personally reviewed the tracing from 06/25/2016 which showed rate-controlled atrial fibrillation with poor R-wave progression and nonspecific ST-T changes.  Recent Labwork: 07/05/2016: ALT 29; AST 31; BUN 17; Creatinine, Ser 1.05; Hemoglobin 14.1; Platelets 336; Potassium 3.5; Sodium 136     Component Value Date/Time   CHOL 190 12/24/2011 0545   TRIG 139 12/24/2011 0545   HDL 36 (L) 12/24/2011 0545   CHOLHDL 5.3 12/24/2011 0545   VLDL 28 12/24/2011 0545   LDLCALC 126 (H) 12/24/2011 0545    Other Studies Reviewed  Today:  Lexiscan Cardiolite 04/26/2015:  There was no ST segment deviation noted during stress.  The study is normal.  This is a low risk study.  Nuclear stress EF: 62%.  Echocardiogram 06/26/2016: Study Conclusions  - Left ventricle: The cavity size was normal. Wall thickness was   increased in a pattern of mild LVH. Systolic function was normal.   The estimated ejection fraction was in the range of 55% to 60%.   Wall motion was normal; there were no regional wall motion   abnormalities. The study is not technically sufficient to allow   evaluation of LV diastolic function. - Aortic valve: Mildly calcified annulus. Trileaflet; mildly   calcified leaflets. There was trivial regurgitation. Mean   gradient (S): 8 mm Hg. Peak gradient (S): 13 mm Hg. Valve area   (VTI): 1.54 cm^2. - Mitral valve: Calcified annulus. Mildly thickened leaflets .   There was mild regurgitation. - Left atrium: The atrium was severely dilated. - Right atrium: Central venous pressure (est): 3 mm Hg. - Tricuspid valve: There was trivial regurgitation. - Pulmonary arteries: PA peak pressure: 16 mm Hg (S). - Pericardium, extracardiac: There was no pericardial effusion.  Impressions:  - Mild LVH with LVEF 55-60%. Indeterminate diastolic function in   the setting of atrial fibrillation. Severe left atrial   enlargement. Calcified mitral annulus with mildly thickened   leaflets and mild mitral regurgitation. Mild calcific aortic   stenosis with trivial aortic regurgitation. Trivial tricuspid   regurgitation with normal estimated PASP. No pericardial   effusion.  Assessment and Plan:  1. Persistent atrial fibrillation. Continue heart rate control strategy and anticoagulation with Coumadin. Follow-up INR with Dr. Quillian Quince.  2. Nonobstructive CAD by history, no active angina symptoms. He continues on statin therapy.  3. Essential hypertension, systolic blood pressure in the 130s today.  Current medicines  were reviewed with the patient today.  Disposition: Follow-up in 6 months.  Signed, Satira Sark, MD, [facility] 11/29/2016 3:13 PM    [facility] [place], [place], [place] [address] Phone: [phone]; Fax: [phone]

## 2016-11-29 NOTE — Patient Instructions (Signed)

## 2016-12-02 DIAGNOSIS — I350 Nonrheumatic aortic (valve) stenosis: Secondary | ICD-10-CM | POA: Diagnosis not present

## 2016-12-06 DIAGNOSIS — I350 Nonrheumatic aortic (valve) stenosis: Secondary | ICD-10-CM | POA: Diagnosis not present

## 2016-12-09 DIAGNOSIS — I482 Chronic atrial fibrillation: Secondary | ICD-10-CM | POA: Diagnosis not present

## 2016-12-09 DIAGNOSIS — I350 Nonrheumatic aortic (valve) stenosis: Secondary | ICD-10-CM | POA: Diagnosis not present

## 2016-12-13 DIAGNOSIS — I4891 Unspecified atrial fibrillation: Secondary | ICD-10-CM | POA: Diagnosis not present

## 2016-12-20 DIAGNOSIS — I482 Chronic atrial fibrillation: Secondary | ICD-10-CM | POA: Diagnosis not present

## 2016-12-30 DIAGNOSIS — L03113 Cellulitis of right upper limb: Secondary | ICD-10-CM | POA: Diagnosis not present

## 2017-01-02 DIAGNOSIS — I350 Nonrheumatic aortic (valve) stenosis: Secondary | ICD-10-CM | POA: Diagnosis not present

## 2017-01-02 DIAGNOSIS — I482 Chronic atrial fibrillation: Secondary | ICD-10-CM | POA: Diagnosis not present

## 2017-01-07 DIAGNOSIS — E782 Mixed hyperlipidemia: Secondary | ICD-10-CM | POA: Diagnosis not present

## 2017-01-07 DIAGNOSIS — I10 Essential (primary) hypertension: Secondary | ICD-10-CM | POA: Diagnosis not present

## 2017-01-07 DIAGNOSIS — Z9189 Other specified personal risk factors, not elsewhere classified: Secondary | ICD-10-CM | POA: Diagnosis not present

## 2017-01-07 DIAGNOSIS — E1165 Type 2 diabetes mellitus with hyperglycemia: Secondary | ICD-10-CM | POA: Diagnosis not present

## 2017-01-07 DIAGNOSIS — K210 Gastro-esophageal reflux disease with esophagitis: Secondary | ICD-10-CM | POA: Diagnosis not present

## 2017-01-07 DIAGNOSIS — I482 Chronic atrial fibrillation: Secondary | ICD-10-CM | POA: Diagnosis not present

## 2017-01-07 DIAGNOSIS — N183 Chronic kidney disease, stage 3 (moderate): Secondary | ICD-10-CM | POA: Diagnosis not present

## 2017-01-09 DIAGNOSIS — I10 Essential (primary) hypertension: Secondary | ICD-10-CM | POA: Diagnosis not present

## 2017-01-09 DIAGNOSIS — Z6828 Body mass index (BMI) 28.0-28.9, adult: Secondary | ICD-10-CM | POA: Diagnosis not present

## 2017-01-09 DIAGNOSIS — I482 Chronic atrial fibrillation: Secondary | ICD-10-CM | POA: Diagnosis not present

## 2017-01-09 DIAGNOSIS — E8881 Metabolic syndrome: Secondary | ICD-10-CM | POA: Diagnosis not present

## 2017-01-09 DIAGNOSIS — F331 Major depressive disorder, recurrent, moderate: Secondary | ICD-10-CM | POA: Diagnosis not present

## 2017-01-09 DIAGNOSIS — K219 Gastro-esophageal reflux disease without esophagitis: Secondary | ICD-10-CM | POA: Diagnosis not present

## 2017-01-09 DIAGNOSIS — E782 Mixed hyperlipidemia: Secondary | ICD-10-CM | POA: Diagnosis not present

## 2017-01-09 DIAGNOSIS — I5032 Chronic diastolic (congestive) heart failure: Secondary | ICD-10-CM | POA: Diagnosis not present

## 2017-01-14 DIAGNOSIS — I350 Nonrheumatic aortic (valve) stenosis: Secondary | ICD-10-CM | POA: Diagnosis not present

## 2017-01-14 DIAGNOSIS — I482 Chronic atrial fibrillation: Secondary | ICD-10-CM | POA: Diagnosis not present

## 2017-01-21 DIAGNOSIS — I482 Chronic atrial fibrillation: Secondary | ICD-10-CM | POA: Diagnosis not present

## 2017-01-21 DIAGNOSIS — I350 Nonrheumatic aortic (valve) stenosis: Secondary | ICD-10-CM | POA: Diagnosis not present

## 2017-02-20 DIAGNOSIS — I350 Nonrheumatic aortic (valve) stenosis: Secondary | ICD-10-CM | POA: Diagnosis not present

## 2017-02-20 DIAGNOSIS — I482 Chronic atrial fibrillation: Secondary | ICD-10-CM | POA: Diagnosis not present

## 2017-02-24 DIAGNOSIS — L570 Actinic keratosis: Secondary | ICD-10-CM | POA: Diagnosis not present

## 2017-03-20 DIAGNOSIS — M79671 Pain in right foot: Secondary | ICD-10-CM | POA: Diagnosis not present

## 2017-03-20 DIAGNOSIS — I482 Chronic atrial fibrillation: Secondary | ICD-10-CM | POA: Diagnosis not present

## 2017-04-03 DIAGNOSIS — I482 Chronic atrial fibrillation: Secondary | ICD-10-CM | POA: Diagnosis not present

## 2017-04-08 DIAGNOSIS — Z6829 Body mass index (BMI) 29.0-29.9, adult: Secondary | ICD-10-CM | POA: Diagnosis not present

## 2017-04-08 DIAGNOSIS — I482 Chronic atrial fibrillation: Secondary | ICD-10-CM | POA: Diagnosis not present

## 2017-04-08 DIAGNOSIS — M545 Low back pain: Secondary | ICD-10-CM | POA: Diagnosis not present

## 2017-04-08 DIAGNOSIS — R072 Precordial pain: Secondary | ICD-10-CM | POA: Diagnosis not present

## 2017-04-08 DIAGNOSIS — R42 Dizziness and giddiness: Secondary | ICD-10-CM | POA: Diagnosis not present

## 2017-04-17 DIAGNOSIS — I350 Nonrheumatic aortic (valve) stenosis: Secondary | ICD-10-CM | POA: Diagnosis not present

## 2017-05-01 DIAGNOSIS — I482 Chronic atrial fibrillation: Secondary | ICD-10-CM | POA: Diagnosis not present

## 2017-05-01 DIAGNOSIS — I350 Nonrheumatic aortic (valve) stenosis: Secondary | ICD-10-CM | POA: Diagnosis not present

## 2017-05-01 DIAGNOSIS — Z23 Encounter for immunization: Secondary | ICD-10-CM | POA: Diagnosis not present

## 2017-05-06 DIAGNOSIS — I350 Nonrheumatic aortic (valve) stenosis: Secondary | ICD-10-CM | POA: Diagnosis not present

## 2017-05-06 DIAGNOSIS — I482 Chronic atrial fibrillation: Secondary | ICD-10-CM | POA: Diagnosis not present

## 2017-05-13 DIAGNOSIS — I482 Chronic atrial fibrillation: Secondary | ICD-10-CM | POA: Diagnosis not present

## 2017-05-29 DIAGNOSIS — M545 Low back pain: Secondary | ICD-10-CM | POA: Diagnosis not present

## 2017-05-29 DIAGNOSIS — J0100 Acute maxillary sinusitis, unspecified: Secondary | ICD-10-CM | POA: Diagnosis not present

## 2017-05-29 DIAGNOSIS — Z6829 Body mass index (BMI) 29.0-29.9, adult: Secondary | ICD-10-CM | POA: Diagnosis not present

## 2017-06-02 DIAGNOSIS — I350 Nonrheumatic aortic (valve) stenosis: Secondary | ICD-10-CM | POA: Diagnosis not present

## 2017-06-02 DIAGNOSIS — I482 Chronic atrial fibrillation: Secondary | ICD-10-CM | POA: Diagnosis not present

## 2017-06-10 DIAGNOSIS — M6281 Muscle weakness (generalized): Secondary | ICD-10-CM | POA: Diagnosis not present

## 2017-06-10 DIAGNOSIS — M545 Low back pain: Secondary | ICD-10-CM | POA: Diagnosis not present

## 2017-06-10 DIAGNOSIS — M25551 Pain in right hip: Secondary | ICD-10-CM | POA: Diagnosis not present

## 2017-06-10 DIAGNOSIS — R262 Difficulty in walking, not elsewhere classified: Secondary | ICD-10-CM | POA: Diagnosis not present

## 2017-06-12 DIAGNOSIS — M6281 Muscle weakness (generalized): Secondary | ICD-10-CM | POA: Diagnosis not present

## 2017-06-12 DIAGNOSIS — M545 Low back pain: Secondary | ICD-10-CM | POA: Diagnosis not present

## 2017-06-12 DIAGNOSIS — R262 Difficulty in walking, not elsewhere classified: Secondary | ICD-10-CM | POA: Diagnosis not present

## 2017-06-12 DIAGNOSIS — M25551 Pain in right hip: Secondary | ICD-10-CM | POA: Diagnosis not present

## 2017-06-17 DIAGNOSIS — R262 Difficulty in walking, not elsewhere classified: Secondary | ICD-10-CM | POA: Diagnosis not present

## 2017-06-17 DIAGNOSIS — M25551 Pain in right hip: Secondary | ICD-10-CM | POA: Diagnosis not present

## 2017-06-17 DIAGNOSIS — M6281 Muscle weakness (generalized): Secondary | ICD-10-CM | POA: Diagnosis not present

## 2017-06-17 DIAGNOSIS — M545 Low back pain: Secondary | ICD-10-CM | POA: Diagnosis not present

## 2017-06-19 DIAGNOSIS — M25551 Pain in right hip: Secondary | ICD-10-CM | POA: Diagnosis not present

## 2017-06-19 DIAGNOSIS — M545 Low back pain: Secondary | ICD-10-CM | POA: Diagnosis not present

## 2017-06-19 DIAGNOSIS — R262 Difficulty in walking, not elsewhere classified: Secondary | ICD-10-CM | POA: Diagnosis not present

## 2017-06-19 DIAGNOSIS — M6281 Muscle weakness (generalized): Secondary | ICD-10-CM | POA: Diagnosis not present

## 2017-06-19 NOTE — Progress Notes (Signed)
Cardiology Office Note  Date: 06/20/2017   ID: GAZELLE [name], DOB 1930-02-23, MRN [id number]  PCP: [name] Bis, MD  Primary Cardiologist: [name] Lesches, MD   Chief Complaint  Patient presents with  . Atrial Fibrillation    History of Present Illness: [name] [name] is an 83 y.o. female last seen in April.  She presents for a routine follow-up visit.  She reports no chest pain and only rare palpitations, usually at nighttime.  She is limited by arthritis pain and stiffness.  She uses a cane and denies any recent falls.  She continues on Coumadin with follow-up per Dr. Quillian [name].  She denies any bleeding problems.  Heart rate control regimen includes both Cardizem CD and Toprol-XL.  I personally reviewed her ECG today which shows rate controlled atrial fibrillation with low voltage, decreased R wave progression rule out old anterior infarct pattern, IVCD with repolarization changes.  Past Medical History:  Diagnosis Date  . Atrial fibrillation ([place])    On Coumadin  . Coronary atherosclerosis of native coronary artery    Nonobstructive at cardiac catheterization 2004  . Dysphagia   . Essential hypertension, benign   . Hyperlipidemia   . Polymyalgia rheumatica ([place])   . Stroke [facility])     Past Surgical History:  Procedure Laterality Date  . APPENDECTOMY    . COLONOSCOPY  2008   Dr.Fleshiman  . Left ankle    . TOE SURGERY    . TOTAL ABDOMINAL HYSTERECTOMY      Current Outpatient Prescriptions  Medication Sig Dispense Refill  . acetaminophen (TYLENOL) 500 MG tablet Take 500 mg by mouth every 6 (six) hours as needed.     . Biotin 1000 MCG tablet Take 1,000 mcg by mouth daily.    . Calcium Carbonate-Vitamin D (CALTRATE 600+D) 600-400 MG-UNIT per tablet Take 1 tablet by mouth daily.      . citalopram (CELEXA) 20 MG tablet Take 20 mg by mouth daily.    Marland Kitchen dicyclomine (BENTYL) 10 MG capsule Take 1 capsule (10 mg total) by mouth 2 (two) times daily. 60 capsule 3  . diltiazem  (CARDIZEM CD) 240 MG 24 hr capsule Take 240 mg by mouth daily.    . furosemide (LASIX) 40 MG tablet Take 40 mg by mouth daily.     Marland Kitchen losartan (COZAAR) 100 MG tablet Take 100 mg by mouth every evening.     . metoprolol succinate (TOPROL-XL) 50 MG 24 hr tablet Take 75 mg by mouth at bedtime. Take with or immediately following a meal.    . predniSONE (DELTASONE) 5 MG tablet Take 5 mg by mouth daily.    Marland Kitchen warfarin (COUMADIN) 4 MG tablet Take 2-4 mg by mouth daily. Alternate taking 4mg  every evening with 2mg  every evening    . atenolol (TENORMIN) 50 MG tablet Take 50 mg by mouth at bedtime.    Marland Kitchen atorvastatin (LIPITOR) 40 MG tablet Take 40 mg by mouth at bedtime.      No current facility-administered medications for this visit.    Allergies:  Sulfonamide derivatives   Social History: The patient  reports that she has never smoked. She has never used smokeless tobacco. She reports that she does not drink alcohol or use drugs.   ROS:  Please see the history of present illness. Otherwise, complete review of systems is positive for hearing loss.  All other systems are reviewed and negative.   Physical Exam: VS:  BP 134/70   Pulse 62  Ht 5' 6.5" (1.689 m)   Wt 183 lb 9.6 oz (83.3 kg)   SpO2 97%   BMI 29.19 kg/m , BMI Body mass index is 29.19 kg/m.  Wt Readings from Last 3 Encounters:  06/20/17 183 lb 9.6 oz (83.3 kg)  11/29/16 177 lb (80.3 kg)  09/24/16 173 lb 3.2 oz (78.6 kg)    General: Elderly woman, appears comfortable at rest. HEENT: Conjunctiva and lids normal, oropharynx clear. Neck: Supple, no elevated JVP or carotid bruits, no thyromegaly. Lungs: No wheezing, nonlabored breathing at rest. Cardiac: Irregularly irregular, no S3, 2/6 systolic murmur, no pericardial rub. Abdomen: Soft, nontender, bowel sounds present, no guarding or rebound. Extremities: Mild ankle edema, distal pulses 2+. Skin: Warm and dry.  ECG: I personally reviewed the tracing from 06/25/2016 which showed  atrial fibrillation with poor R wave progression rule out anterior infarct pattern, nonspecific ST changes.  Recent Labwork: 07/05/2016: ALT 29; AST 31; BUN 17; Creatinine, Ser 1.05; Hemoglobin 14.1; Platelets 336; Potassium 3.5; Sodium 136   Other Studies Reviewed Today:   Echocardiogram 06/26/2016: Study Conclusions  - Left ventricle: The cavity size was normal. Wall thickness was increased in a pattern of mild LVH. Systolic function was normal. The estimated ejection fraction was in the range of 55% to 60%. Wall motion was normal; there were no regional wall motion abnormalities. The study is not technically sufficient to allow evaluation of LV diastolic function. - Aortic valve: Mildly calcified annulus. Trileaflet; mildly calcified leaflets. There was trivial regurgitation. Mean gradient (S): 8 mm Hg. Peak gradient (S): 13 mm Hg. Valve area (VTI): 1.54 cm^2. - Mitral valve: Calcified annulus. Mildly thickened leaflets . There was mild regurgitation. - Left atrium: The atrium was severely dilated. - Right atrium: Central venous pressure (est): 3 mm Hg. - Tricuspid valve: There was trivial regurgitation. - Pulmonary arteries: PA peak pressure: 16 mm Hg (S). - Pericardium, extracardiac: There was no pericardial effusion.  Impressions:  - Mild LVH with LVEF 55-60%. Indeterminate diastolic function in the setting of atrial fibrillation. Severe left atrial enlargement. Calcified mitral annulus with mildly thickened leaflets and mild mitral regurgitation. Mild calcific aortic stenosis with trivial aortic regurgitation. Trivial tricuspid regurgitation with normal estimated PASP. No pericardial effusion.  Assessment and Plan:  1.  Chronic atrial fibrillation.  Tolerating strategy of heart rate control and anticoagulation.  Keep follow-up on Coumadin with Dr. Quillian [name].  2.  History of nonobstructive CAD without angina symptoms.  She continues on  statin therapy.  3.  Essential hypertension, no changes made.  Current medicines were reviewed with the patient today.   Orders Placed This Encounter  Procedures  . EKG 12-Lead    Disposition: Follow-up in 6 months.  Signed, Satira Sark, MD, [facility] 06/20/2017 11:51 AM    [facility] [place], [place],  [address] Phone: [phone]; Fax: [phone]

## 2017-06-20 ENCOUNTER — Encounter: Payer: Self-pay | Admitting: Cardiology

## 2017-06-20 ENCOUNTER — Ambulatory Visit (INDEPENDENT_AMBULATORY_CARE_PROVIDER_SITE_OTHER): Payer: Medicare Other | Admitting: Cardiology

## 2017-06-20 VITALS — BP 134/70 | HR 62 | Ht 66.5 in | Wt 183.6 lb

## 2017-06-20 DIAGNOSIS — I482 Chronic atrial fibrillation, unspecified: Secondary | ICD-10-CM

## 2017-06-20 DIAGNOSIS — I2510 Atherosclerotic heart disease of native coronary artery without angina pectoris: Secondary | ICD-10-CM | POA: Diagnosis not present

## 2017-06-20 DIAGNOSIS — I10 Essential (primary) hypertension: Secondary | ICD-10-CM

## 2017-06-20 NOTE — Patient Instructions (Signed)

## 2017-06-24 DIAGNOSIS — M25551 Pain in right hip: Secondary | ICD-10-CM | POA: Diagnosis not present

## 2017-06-24 DIAGNOSIS — R262 Difficulty in walking, not elsewhere classified: Secondary | ICD-10-CM | POA: Diagnosis not present

## 2017-06-24 DIAGNOSIS — M6281 Muscle weakness (generalized): Secondary | ICD-10-CM | POA: Diagnosis not present

## 2017-06-24 DIAGNOSIS — M545 Low back pain: Secondary | ICD-10-CM | POA: Diagnosis not present

## 2017-06-26 DIAGNOSIS — N183 Chronic kidney disease, stage 3 (moderate): Secondary | ICD-10-CM | POA: Diagnosis not present

## 2017-06-26 DIAGNOSIS — M1612 Unilateral primary osteoarthritis, left hip: Secondary | ICD-10-CM | POA: Diagnosis not present

## 2017-06-26 DIAGNOSIS — M1712 Unilateral primary osteoarthritis, left knee: Secondary | ICD-10-CM | POA: Diagnosis not present

## 2017-06-26 DIAGNOSIS — I5032 Chronic diastolic (congestive) heart failure: Secondary | ICD-10-CM | POA: Diagnosis not present

## 2017-06-26 DIAGNOSIS — I482 Chronic atrial fibrillation: Secondary | ICD-10-CM | POA: Diagnosis not present

## 2017-06-26 DIAGNOSIS — I10 Essential (primary) hypertension: Secondary | ICD-10-CM | POA: Diagnosis not present

## 2017-06-30 DIAGNOSIS — I482 Chronic atrial fibrillation: Secondary | ICD-10-CM | POA: Diagnosis not present

## 2017-06-30 DIAGNOSIS — M1712 Unilateral primary osteoarthritis, left knee: Secondary | ICD-10-CM | POA: Diagnosis not present

## 2017-06-30 DIAGNOSIS — I350 Nonrheumatic aortic (valve) stenosis: Secondary | ICD-10-CM | POA: Diagnosis not present

## 2017-07-08 DIAGNOSIS — J9801 Acute bronchospasm: Secondary | ICD-10-CM | POA: Diagnosis not present

## 2017-07-08 DIAGNOSIS — M1712 Unilateral primary osteoarthritis, left knee: Secondary | ICD-10-CM | POA: Diagnosis not present

## 2017-07-14 DIAGNOSIS — M1712 Unilateral primary osteoarthritis, left knee: Secondary | ICD-10-CM | POA: Diagnosis not present

## 2017-07-21 DIAGNOSIS — M545 Low back pain: Secondary | ICD-10-CM | POA: Diagnosis not present

## 2017-07-21 DIAGNOSIS — M1712 Unilateral primary osteoarthritis, left knee: Secondary | ICD-10-CM | POA: Diagnosis not present

## 2017-07-21 DIAGNOSIS — M25562 Pain in left knee: Secondary | ICD-10-CM | POA: Diagnosis not present

## 2017-07-21 DIAGNOSIS — M25462 Effusion, left knee: Secondary | ICD-10-CM | POA: Diagnosis not present

## 2017-07-24 DIAGNOSIS — M6281 Muscle weakness (generalized): Secondary | ICD-10-CM | POA: Diagnosis not present

## 2017-07-24 DIAGNOSIS — M545 Low back pain: Secondary | ICD-10-CM | POA: Diagnosis not present

## 2017-07-24 DIAGNOSIS — M25551 Pain in right hip: Secondary | ICD-10-CM | POA: Diagnosis not present

## 2017-07-24 DIAGNOSIS — R262 Difficulty in walking, not elsewhere classified: Secondary | ICD-10-CM | POA: Diagnosis not present

## 2017-07-30 DIAGNOSIS — I482 Chronic atrial fibrillation: Secondary | ICD-10-CM | POA: Diagnosis not present

## 2017-07-30 DIAGNOSIS — I350 Nonrheumatic aortic (valve) stenosis: Secondary | ICD-10-CM | POA: Diagnosis not present

## 2017-08-01 DIAGNOSIS — I10 Essential (primary) hypertension: Secondary | ICD-10-CM | POA: Diagnosis not present

## 2017-08-01 DIAGNOSIS — I5032 Chronic diastolic (congestive) heart failure: Secondary | ICD-10-CM | POA: Diagnosis not present

## 2017-08-01 DIAGNOSIS — I350 Nonrheumatic aortic (valve) stenosis: Secondary | ICD-10-CM | POA: Diagnosis not present

## 2017-08-01 DIAGNOSIS — N183 Chronic kidney disease, stage 3 (moderate): Secondary | ICD-10-CM | POA: Diagnosis not present

## 2017-08-01 DIAGNOSIS — F331 Major depressive disorder, recurrent, moderate: Secondary | ICD-10-CM | POA: Diagnosis not present

## 2017-08-01 DIAGNOSIS — E782 Mixed hyperlipidemia: Secondary | ICD-10-CM | POA: Diagnosis not present

## 2017-08-01 DIAGNOSIS — E8881 Metabolic syndrome: Secondary | ICD-10-CM | POA: Diagnosis not present

## 2017-08-01 DIAGNOSIS — K210 Gastro-esophageal reflux disease with esophagitis: Secondary | ICD-10-CM | POA: Diagnosis not present

## 2017-08-01 DIAGNOSIS — I482 Chronic atrial fibrillation: Secondary | ICD-10-CM | POA: Diagnosis not present

## 2017-08-01 DIAGNOSIS — R7301 Impaired fasting glucose: Secondary | ICD-10-CM | POA: Diagnosis not present

## 2017-08-01 DIAGNOSIS — Z9189 Other specified personal risk factors, not elsewhere classified: Secondary | ICD-10-CM | POA: Diagnosis not present

## 2017-08-04 DIAGNOSIS — E782 Mixed hyperlipidemia: Secondary | ICD-10-CM | POA: Diagnosis not present

## 2017-08-04 DIAGNOSIS — I5032 Chronic diastolic (congestive) heart failure: Secondary | ICD-10-CM | POA: Diagnosis not present

## 2017-08-04 DIAGNOSIS — I10 Essential (primary) hypertension: Secondary | ICD-10-CM | POA: Diagnosis not present

## 2017-08-04 DIAGNOSIS — E8881 Metabolic syndrome: Secondary | ICD-10-CM | POA: Diagnosis not present

## 2017-08-04 DIAGNOSIS — M170 Bilateral primary osteoarthritis of knee: Secondary | ICD-10-CM | POA: Diagnosis not present

## 2017-08-04 DIAGNOSIS — K219 Gastro-esophageal reflux disease without esophagitis: Secondary | ICD-10-CM | POA: Diagnosis not present

## 2017-08-04 DIAGNOSIS — R7301 Impaired fasting glucose: Secondary | ICD-10-CM | POA: Diagnosis not present

## 2017-08-04 DIAGNOSIS — I482 Chronic atrial fibrillation: Secondary | ICD-10-CM | POA: Diagnosis not present

## 2017-08-04 DIAGNOSIS — N183 Chronic kidney disease, stage 3 (moderate): Secondary | ICD-10-CM | POA: Diagnosis not present

## 2017-08-12 ENCOUNTER — Emergency Department (HOSPITAL_COMMUNITY)
Admission: EM | Admit: 2017-08-12 | Discharge: 2017-08-12 | Disposition: A | Discharge status: Home / Self Care | Payer: Medicare Other | Attending: Emergency Medicine | Admitting: Emergency Medicine

## 2017-08-12 ENCOUNTER — Encounter (HOSPITAL_COMMUNITY): Payer: Self-pay | Admitting: Emergency Medicine

## 2017-08-12 ENCOUNTER — Emergency Department (HOSPITAL_COMMUNITY): Payer: Medicare Other

## 2017-08-12 DIAGNOSIS — W19XXXA Unspecified fall, initial encounter: Secondary | ICD-10-CM

## 2017-08-12 DIAGNOSIS — S3992XA Unspecified injury of lower back, initial encounter: Secondary | ICD-10-CM | POA: Diagnosis not present

## 2017-08-12 DIAGNOSIS — Y929 Unspecified place or not applicable: Secondary | ICD-10-CM | POA: Insufficient documentation

## 2017-08-12 DIAGNOSIS — Y999 Unspecified external cause status: Secondary | ICD-10-CM | POA: Diagnosis not present

## 2017-08-12 DIAGNOSIS — Z79899 Other long term (current) drug therapy: Secondary | ICD-10-CM | POA: Diagnosis not present

## 2017-08-12 DIAGNOSIS — Y939 Activity, unspecified: Secondary | ICD-10-CM | POA: Diagnosis not present

## 2017-08-12 DIAGNOSIS — S79911A Unspecified injury of right hip, initial encounter: Secondary | ICD-10-CM | POA: Diagnosis not present

## 2017-08-12 DIAGNOSIS — M25562 Pain in left knee: Secondary | ICD-10-CM | POA: Diagnosis not present

## 2017-08-12 DIAGNOSIS — M545 Low back pain: Secondary | ICD-10-CM | POA: Diagnosis not present

## 2017-08-12 DIAGNOSIS — R600 Localized edema: Secondary | ICD-10-CM | POA: Diagnosis not present

## 2017-08-12 DIAGNOSIS — M533 Sacrococcygeal disorders, not elsewhere classified: Secondary | ICD-10-CM | POA: Diagnosis not present

## 2017-08-12 DIAGNOSIS — S0990XA Unspecified injury of head, initial encounter: Secondary | ICD-10-CM | POA: Diagnosis not present

## 2017-08-12 DIAGNOSIS — M25551 Pain in right hip: Secondary | ICD-10-CM | POA: Diagnosis not present

## 2017-08-12 DIAGNOSIS — S199XXA Unspecified injury of neck, initial encounter: Secondary | ICD-10-CM | POA: Diagnosis not present

## 2017-08-12 DIAGNOSIS — W2209XA Striking against other stationary object, initial encounter: Secondary | ICD-10-CM | POA: Insufficient documentation

## 2017-08-12 DIAGNOSIS — I10 Essential (primary) hypertension: Secondary | ICD-10-CM | POA: Insufficient documentation

## 2017-08-12 DIAGNOSIS — I2510 Atherosclerotic heart disease of native coronary artery without angina pectoris: Secondary | ICD-10-CM | POA: Diagnosis not present

## 2017-08-12 DIAGNOSIS — S8992XA Unspecified injury of left lower leg, initial encounter: Secondary | ICD-10-CM | POA: Diagnosis not present

## 2017-08-12 MED ORDER — ACETAMINOPHEN 325 MG PO TABS
650.0000 mg | ORAL_TABLET | Freq: Once | ORAL | Status: AC
  Administered 2017-08-12: 650 mg via ORAL
  Filled 2017-08-12: qty 2

## 2017-08-12 NOTE — Discharge Instructions (Signed)
You were seen in the emergency department today following a fall at home. The following images were obtained during your visit:  - CT of your head/neck - X-rays of your lower back, tailbone area, right hip, left knee, and left lower leg.   All imaging showed no new abnormalities. There were no fractures or dislocations.   You will need to follow up with your primary care provider in the next 5 days for re-evaluation. Return to the emergency department for any new or worsening symptoms including, but not limited to passing out, changes in your vision, numbness, weakness, lightheadedness, dizziness, or a new fall/injury.

## 2017-08-12 NOTE — ED Provider Notes (Signed)
Medical screening examination/treatment/procedure(s) were conducted as a shared visit with non-physician practitioner(s) and myself.  I personally evaluated the patient during the encounter.   EKG Interpretation None       Results for orders placed or performed during the hospital encounter of 07/05/16  Urine culture  Result Value Ref Range   Specimen Description URINE, CLEAN CATCH    Special Requests NONE    Culture (A)     <10,000 COLONIES/mL INSIGNIFICANT GROWTH Performed at [facility]    Report Status 07/08/2016 FINAL   Rapid strep screen  Result Value Ref Range   Streptococcus, Group A Screen (Direct) NEGATIVE NEGATIVE  Culture, group A strep  Result Value Ref Range   Specimen Description THROAT    Special Requests NONE Reflexed from [id number]    Culture      NO GROUP A STREP (S.PYOGENES) ISOLATED Performed at [facility]    Report Status 07/09/2016 FINAL   Urinalysis, Routine w reflex microscopic (not at [facility])  Result Value Ref Range   Color, Urine YELLOW YELLOW   APPearance CLEAR CLEAR   Specific Gravity, Urine 1.010 1.005 - 1.030   pH 6.0 5.0 - 8.0   Glucose, UA NEGATIVE NEGATIVE mg/dL   Hgb urine dipstick NEGATIVE NEGATIVE   Bilirubin Urine NEGATIVE NEGATIVE   Ketones, ur NEGATIVE NEGATIVE mg/dL   Protein, ur NEGATIVE NEGATIVE mg/dL   Nitrite NEGATIVE NEGATIVE   Leukocytes, UA TRACE (A) NEGATIVE  Comprehensive metabolic panel  Result Value Ref Range   Sodium 136 135 - 145 mmol/L   Potassium 3.5 3.5 - 5.1 mmol/L   Chloride 99 (L) 101 - 111 mmol/L   CO2 24 22 - 32 mmol/L   Glucose, Bld 139 (H) 65 - 99 mg/dL   BUN 17 6 - 20 mg/dL   Creatinine, Ser 1.05 (H) 0.44 - 1.00 mg/dL   Calcium 9.5 8.9 - 10.3 mg/dL   Total Protein 8.2 (H) 6.5 - 8.1 g/dL   Albumin 4.6 3.5 - 5.0 g/dL   AST 31 15 - 41 U/L   ALT 29 14 - 54 U/L   Alkaline Phosphatase 103 38 - 126 U/L   Total Bilirubin 0.9 0.3 - 1.2 mg/dL   GFR calc non Af Amer 47 (L) >60 mL/min   GFR  calc Af Amer 55 (L) >60 mL/min   Anion gap 13 5 - 15  CBC with Differential  Result Value Ref Range   WBC 10.5 4.0 - 10.5 K/uL   RBC 4.56 3.87 - 5.11 MIL/uL   Hemoglobin 14.1 12.0 - 15.0 g/dL   HCT 41.7 36.0 - 46.0 %   MCV 91.4 78.0 - 100.0 fL   MCH 30.9 26.0 - 34.0 pg   MCHC 33.8 30.0 - 36.0 g/dL   RDW 13.8 11.5 - 15.5 %   Platelets 336 150 - 400 K/uL   Neutrophils Relative % 76 %   Neutro Abs 7.9 (H) 1.7 - 7.7 K/uL   Lymphocytes Relative 20 %   Lymphs Abs 2.1 0.7 - 4.0 K/uL   Monocytes Relative 4 %   Monocytes Absolute 0.4 0.1 - 1.0 K/uL   Eosinophils Relative 0 %   Eosinophils Absolute 0.0 0.0 - 0.7 K/uL   Basophils Relative 0 %   Basophils Absolute 0.0 0.0 - 0.1 K/uL  Lactic acid, plasma  Result Value Ref Range   Lactic Acid, Venous 1.7 0.5 - 1.9 mmol/L  Protime-INR  Result Value Ref Range   Prothrombin Time 25.2 (H)  11.4 - 15.2 seconds   INR 2.24   Influenza panel by PCR (type A & B, H1N1)  Result Value Ref Range   Influenza A By PCR NEGATIVE NEGATIVE   Influenza B By PCR NEGATIVE NEGATIVE  Urine microscopic-add on  Result Value Ref Range   Squamous Epithelial / LPF 0-5 (A) NONE SEEN   WBC, UA 0-5 0 - 5 WBC/hpf   RBC / HPF NONE SEEN 0 - 5 RBC/hpf   Bacteria, UA RARE (A) NONE SEEN   Dg Lumbar Spine Complete  Result Date: 08/12/2017 CLINICAL DATA:  Fall with pain on the left side EXAM: LUMBAR SPINE - COMPLETE 4+ VIEW COMPARISON:  06/13/2015 FINDINGS: Mild S-shaped scoliosis of the lumbar spine. Trace retrolisthesis of L3 on L4 as before. Straightening of the lumbar spine. Vertebral body heights are normal. Mild degenerative changes at L2-L3. Moderate to marked degenerative changes at L3-L4 and moderate changes at L4-L5. Aortic atherosclerosis. IMPRESSION: 1. No acute osseous abnormality 2. Multilevel moderate-to-marked degenerative changes of the spine Electronically Signed   By: Donavan Foil M.D.   On: 08/12/2017 20:35   Dg Sacrum/coccyx  Result Date:  08/12/2017 CLINICAL DATA:  Fall with pain EXAM: SACRUM AND COCCYX - 2+ VIEW COMPARISON:  None. FINDINGS: Pubic symphysis and rami are intact. The SI joints do not appear widened. No definite acute displaced fracture. IMPRESSION: No definite acute osseous abnormality. Electronically Signed   By: Donavan Foil M.D.   On: 08/12/2017 20:36   Dg Tibia/fibula Left  Result Date: 08/12/2017 CLINICAL DATA:  83 year old female with fall and left knee pain. EXAM: LEFT KNEE - COMPLETE 4+ VIEW; LEFT TIBIA AND FIBULA - 2 VIEW COMPARISON:  Left knee radiograph dated 06/30/2017 FINDINGS: There is no acute fracture or dislocation. Distal fibular side plate fixation as well as fixation screws through the medial malleolus noted. The orthopedic hardware appear intact. The bones are osteopenic. No joint effusion. There is diffuse subcutaneous edema. IMPRESSION: No acute fracture or dislocation. Electronically Signed   By: Anner Crete M.D.   On: 08/12/2017 20:35   Ct Head Wo Contrast  Result Date: 08/12/2017 CLINICAL DATA:  Fall with head injury. History of prior left hemispheric cerebral infarction. EXAM: CT HEAD WITHOUT CONTRAST CT CERVICAL SPINE WITHOUT CONTRAST TECHNIQUE: Multidetector CT imaging of the head and cervical spine was performed following the standard protocol without intravenous contrast. Multiplanar CT image reconstructions of the cervical spine were also generated. COMPARISON:  CT of the head on 12/23/2011. Additional CT studies in 2014 and 2017 currently cannot be retrieved. FINDINGS: CT HEAD FINDINGS Brain: Stable left MCA territory chronic cerebral infarct. The brain demonstrates no evidence of hemorrhage, acute infarction, edema, mass effect, extra-axial fluid collection, hydrocephalus or mass lesion. Vascular: No hyperdense vessel or unexpected calcification. Skull: Normal. Negative for fracture or focal lesion. Sinuses/Orbits: No acute finding. Other: No significant scalp injury or evidence of  scalp foreign body. CT CERVICAL SPINE FINDINGS Alignment: The cervical spine CT acquisition was impair by motion. No obvious fracture or subluxation. Skull base and vertebrae: No obvious fracture.  No bony lesions. Soft tissues and spinal canal: No soft tissue swelling or other soft tissue abnormalities identified. Disc levels:  Mild degenerative disc disease, primarily at C5-6. Upper chest: Negative. IMPRESSION: 1. Old left MCA territory cerebral infarct appears stable. No acute findings by head CT. 2. Limited cervical spine evaluation due to motion during the cervical spine CT acquisition. No obvious fracture. Electronically Signed   By: Jenness Corner.D.  On: 08/12/2017 20:16   Ct Cervical Spine Wo Contrast  Result Date: 08/12/2017 CLINICAL DATA:  Fall with head injury. History of prior left hemispheric cerebral infarction. EXAM: CT HEAD WITHOUT CONTRAST CT CERVICAL SPINE WITHOUT CONTRAST TECHNIQUE: Multidetector CT imaging of the head and cervical spine was performed following the standard protocol without intravenous contrast. Multiplanar CT image reconstructions of the cervical spine were also generated. COMPARISON:  CT of the head on 12/23/2011. Additional CT studies in 2014 and 2017 currently cannot be retrieved. FINDINGS: CT HEAD FINDINGS Brain: Stable left MCA territory chronic cerebral infarct. The brain demonstrates no evidence of hemorrhage, acute infarction, edema, mass effect, extra-axial fluid collection, hydrocephalus or mass lesion. Vascular: No hyperdense vessel or unexpected calcification. Skull: Normal. Negative for fracture or focal lesion. Sinuses/Orbits: No acute finding. Other: No significant scalp injury or evidence of scalp foreign body. CT CERVICAL SPINE FINDINGS Alignment: The cervical spine CT acquisition was impair by motion. No obvious fracture or subluxation. Skull base and vertebrae: No obvious fracture.  No bony lesions. Soft tissues and spinal canal: No soft tissue  swelling or other soft tissue abnormalities identified. Disc levels:  Mild degenerative disc disease, primarily at C5-6. Upper chest: Negative. IMPRESSION: 1. Old left MCA territory cerebral infarct appears stable. No acute findings by head CT. 2. Limited cervical spine evaluation due to motion during the cervical spine CT acquisition. No obvious fracture. Electronically Signed   By: Aletta Edouard M.D.   On: 08/12/2017 20:16   Dg Knee Complete 4 Views Left  Result Date: 08/12/2017 CLINICAL DATA:  83 year old female with fall and left knee pain. EXAM: LEFT KNEE - COMPLETE 4+ VIEW; LEFT TIBIA AND FIBULA - 2 VIEW COMPARISON:  Left knee radiograph dated 06/30/2017 FINDINGS: There is no acute fracture or dislocation. Distal fibular side plate fixation as well as fixation screws through the medial malleolus noted. The orthopedic hardware appear intact. The bones are osteopenic. No joint effusion. There is diffuse subcutaneous edema. IMPRESSION: No acute fracture or dislocation. Electronically Signed   By: Anner Crete M.D.   On: 08/12/2017 20:35   Dg Hip Unilat With Pelvis 2-3 Views Right  Result Date: 08/12/2017 CLINICAL DATA:  Fall today on left side with right-sided hip pain. Initial encounter. EXAM: DG HIP (WITH OR WITHOUT PELVIS) 2-3V RIGHT COMPARISON:  None. FINDINGS: No evidence of hip fracture or dislocation. No visible pelvic ring fracture or diastasis. Osteopenia. IMPRESSION: No acute finding. Electronically Signed   By: Monte Fantasia M.D.   On: 08/12/2017 20:42     Patient seen by me along with the physician assistant.  Patient reports reaching for her keys and she fell over on her right side.  Along with her walker.  Patient did hit her head on the countertop.  Patient is on Coumadin.  No loss of consciousness.  Patient's complaint is low back pain left knee pain and right hip area pain.  No chest pain no abdominal pain.   Workup to include x-rays of hip and pelvis on the right without  any acute findings.  X-rays of the left knee without acute any bony abnormalities.  X-rays of the lumbar area without.  CT head and neck without any acute abnormalities.  On exam she had some swelling to the left knee.  Pretty good range of motion of the hip on the right side.  Tenderness to palpation lower lumbar area.  Head without trauma.  Alert oriented no focal neuro deficits.  Heart regular lungs clear abdomen soft  and nontender.  Patient clinically stable.  Patient stable for discharge home and follow-up with primary care doctor.   Fredia Sorrow, MD 08/12/17 [id number]

## 2017-08-12 NOTE — ED Triage Notes (Signed)
Pt reports reaching to get her keys she dropped and fell over on right side along with her walker.  Pt c/o right hip pain and tailbone pain.  Hit head on kitchen cabinet.  Pt is on coumadin.

## 2017-08-12 NOTE — ED Provider Notes (Signed)
[facility] EMERGENCY DEPARTMENT Provider Note   CSN: [id number] Arrival date & time: 08/12/17  7124     History   Chief Complaint Chief Complaint  Patient presents with  . Fall    HPI [name] [name] is a 83 y.o. female with a history of atrial fibrillation on warfarin, CAD, HTN, hyperlipidemia, and stroke who presents to the emergency department status post mechanical fall with pain in multiple areas.  Patient walks with a walker at baseline, states that she was walking, bent down to pick up some keys and lost her balance.  Denies lightheadedness, dizziness, chest pain, or shortness of breath prior to fall.  States during the fall she hit her head on a [place] countertop, also hit the left knee and fell onto her right hip/back.  Patient states that she did not have any loss of consciousness.  Reports that after the fall and head injury she did feel somewhat lightheaded as if she may pass out, no syncopal episode.  Had one episode of vomiting in route to the emergency department.  Complaining of pain to the top of her head, lower back, tailbone, R hip, and L knee/lower leg at present.  States she has chronic L knee pain at baseline. Patient denies neck pain, change in vision, numbness, or weakness.  HPI  Past Medical History:  Diagnosis Date  . Atrial fibrillation ([place])    On Coumadin  . Coronary atherosclerosis of native coronary artery    Nonobstructive at cardiac catheterization 2004  . Dysphagia   . Essential hypertension, benign   . Hyperlipidemia   . Polymyalgia rheumatica ([place])   . Stroke [facility])     Patient Active Problem List   Diagnosis Date Noted  . Hypokalemia   . Hypoxia   . SOB (shortness of breath)   . Acute bronchitis   . Atypical chest pain 09/25/2015  . Acute diastolic CHF (congestive heart failure) ([place]) 12/29/2014  . Acute diastolic congestive heart failure ([place]) 12/29/2014  . Dyspnea   . Chronic diarrhea 07/04/2014  . Coronary atherosclerosis of native  coronary artery 03/28/2014  . Long term (current) use of anticoagulants 11/26/2010  . HYPERLIPIDEMIA 05/11/2007  . Essential hypertension, benign 05/11/2007  . Chronic atrial fibrillation ([place]) 05/11/2007    Past Surgical History:  Procedure Laterality Date  . APPENDECTOMY    . COLONOSCOPY  2008   Dr.Fleshiman  . Left ankle    . TOE SURGERY    . TOTAL ABDOMINAL HYSTERECTOMY      OB History    Gravida Para Term Preterm AB Living             4   SAB TAB Ectopic Multiple Live Births                   Home Medications    Prior to Admission medications   Medication Sig Start Date End Date Taking? Authorizing Provider  acetaminophen (TYLENOL) 500 MG tablet Take 500-1,000 mg by mouth daily as needed for moderate pain.    Yes [provider]  atenolol (TENORMIN) 50 MG tablet Take 50 mg by mouth at bedtime.   Yes [provider]  atorvastatin (LIPITOR) 40 MG tablet Take 40 mg by mouth at bedtime.    Yes [provider]  Biotin 1000 MCG tablet Take 1,000 mcg by mouth daily.   Yes [provider]  Calcium Carbonate-Vitamin D (CALTRATE 600+D) 600-400 MG-UNIT per tablet Take 1 tablet by mouth daily.  Yes [provider]  citalopram (CELEXA) 20 MG tablet Take 20 mg by mouth daily.   Yes [provider]  diltiazem (CARDIZEM CD) 240 MG 24 hr capsule Take 240 mg by mouth daily.   Yes [provider]  furosemide (LASIX) 20 MG tablet Take 20 mg by mouth daily.    Yes [provider]  losartan (COZAAR) 100 MG tablet Take 100 mg by mouth every evening.    Yes [provider]  predniSONE (DELTASONE) 5 MG tablet Take 5 mg by mouth daily.   Yes [provider]  warfarin (COUMADIN) 4 MG tablet Take 2 mg by mouth every evening.    Yes [provider]    Family History Family History  Problem Relation Age of Onset  . Stroke Mother   . Hypertension Sister   . Hypertension Brother   . Rheum  arthritis Daughter   . Healthy Son   . Healthy Son   . Healthy Son   . Hypertension Sister   . Heart disease Sister   . Heart disease Brother   . Hypertension Brother   . Arthritis Brother   . Colon cancer Brother   . Hypertension Brother   . Coronary artery disease Other     Social History Social History   Tobacco Use  . Smoking status: Never Smoker  . Smokeless tobacco: Never Used  Substance Use Topics  . Alcohol use: No    Alcohol/week: 0.0 oz  . Drug use: No     Allergies   Sulfonamide derivatives   Review of Systems Review of Systems  Constitutional: Negative for chills and fever.  Eyes: Negative for visual disturbance.  Respiratory: Negative for shortness of breath.   Cardiovascular: Negative for chest pain.  Gastrointestinal: Positive for vomiting (1 episode). Negative for abdominal pain.  Musculoskeletal: Positive for arthralgias (R hip, L knee) and back pain. Negative for neck pain.  Neurological: Positive for light-headedness (following head injury, not prior to or at present) and headaches (head pain where she hit it). Negative for dizziness, syncope, weakness and numbness.  All other systems reviewed and are negative.   Physical Exam Updated Vital Signs BP (!) 151/86 (BP Location: Left Arm)   Pulse 98   Temp 98.6 F (37 C) (Oral)   Resp 20   Wt 83.5 kg (184 lb)   SpO2 100%   BMI 29.25 kg/m   Physical Exam  Constitutional: She appears well-developed and well-nourished.  Non-toxic appearance. No distress.  HENT:  Head: Head is with contusion (3 cm diameter to central parietal region). Head is without raccoon's eyes, without Battle's sign and without laceration.  Right Ear: No hemotympanum.  Left Ear: No hemotympanum.  Mouth/Throat: Uvula is midline and oropharynx is clear and moist.  Eyes: Conjunctivae and EOM are normal. Pupils are equal, round, and reactive to light. Right eye exhibits no discharge. Left eye exhibits no discharge.  Neck: Full  passive range of motion without pain. Neck supple. No spinous process tenderness and no muscular tenderness present.  Cardiovascular: Normal rate. An irregularly irregular rhythm present.  No murmur heard. Pulses:      Radial pulses are 2+ on the right side, and 2+ on the left side.       Dorsalis pedis pulses are 2+ on the right side, and 2+ on the left side.  Pulmonary/Chest: Breath sounds normal. No respiratory distress. She has no wheezes. She has no rales.  Abdominal: Soft. She exhibits no distension. There is  no tenderness.  Musculoskeletal:  No obvious deformity, erythema, or ecchymosis.  Back: No thoracic midline tenderness. Tenderness to palpation diffuse to lumbar vertebra extending to sacrum/coccyx.  Upper Extremities: No obvious deformity, full ROM, no bony tenderness.  Lower Extremities: No obvious deformity, ecchymosis, or open wounds. LLE: Diffuse swelling to L knee. patient is able to lift leg off of the bed, knee flexion/extension limited secondary to pain (chronic knee pain), full ROM at ankle. Tenderness to palpation diffusely to lower leg and knee, no focal tenderness. Otherwise no bony tenderness  RLE: patient is able to lift leg off of the bed, full ROM at knee and ankle. Tenderness to palpation along the lateral hip, no other bony tenderness to palpation.   Neurological:  Alert. Clear speech. No facial droop. CNIII-XII are intact. Bilateral upper and lower extremities' sensation intact to sharp and dull touch. 5/5 grip strength bilaterally. 5/5 plantar and dorsi flexion bilaterally.   Skin: Skin is warm and dry. Capillary refill takes less than 2 seconds. No rash noted.  Psychiatric: She has a normal mood and affect. Her behavior is normal.  Nursing note and vitals reviewed.   ED Treatments / Results  Labs (all labs ordered are listed, but only abnormal results are displayed) Labs Reviewed - No data to display  EKG  EKG Interpretation None       Radiology Dg  Lumbar Spine Complete  Result Date: 08/12/2017 CLINICAL DATA:  Fall with pain on the left side EXAM: LUMBAR SPINE - COMPLETE 4+ VIEW COMPARISON:  06/13/2015 FINDINGS: Mild S-shaped scoliosis of the lumbar spine. Trace retrolisthesis of L3 on L4 as before. Straightening of the lumbar spine. Vertebral body heights are normal. Mild degenerative changes at L2-L3. Moderate to marked degenerative changes at L3-L4 and moderate changes at L4-L5. Aortic atherosclerosis. IMPRESSION: 1. No acute osseous abnormality 2. Multilevel moderate-to-marked degenerative changes of the spine Electronically Signed   By: Donavan Foil M.D.   On: 08/12/2017 20:35   Dg Sacrum/coccyx  Result Date: 08/12/2017 CLINICAL DATA:  Fall with pain EXAM: SACRUM AND COCCYX - 2+ VIEW COMPARISON:  None. FINDINGS: Pubic symphysis and rami are intact. The SI joints do not appear widened. No definite acute displaced fracture. IMPRESSION: No definite acute osseous abnormality. Electronically Signed   By: Donavan Foil M.D.   On: 08/12/2017 20:36   Dg Tibia/fibula Left  Result Date: 08/12/2017 CLINICAL DATA:  83 year old female with fall and left knee pain. EXAM: LEFT KNEE - COMPLETE 4+ VIEW; LEFT TIBIA AND FIBULA - 2 VIEW COMPARISON:  Left knee radiograph dated 06/30/2017 FINDINGS: There is no acute fracture or dislocation. Distal fibular side plate fixation as well as fixation screws through the medial malleolus noted. The orthopedic hardware appear intact. The bones are osteopenic. No joint effusion. There is diffuse subcutaneous edema. IMPRESSION: No acute fracture or dislocation. Electronically Signed   By: Anner Crete M.D.   On: 08/12/2017 20:35   Ct Head Wo Contrast  Result Date: 08/12/2017 CLINICAL DATA:  Fall with head injury. History of prior left hemispheric cerebral infarction. EXAM: CT HEAD WITHOUT CONTRAST CT CERVICAL SPINE WITHOUT CONTRAST TECHNIQUE: Multidetector CT imaging of the head and cervical spine was performed  following the standard protocol without intravenous contrast. Multiplanar CT image reconstructions of the cervical spine were also generated. COMPARISON:  CT of the head on 12/23/2011. Additional CT studies in 2014 and 2017 currently cannot be retrieved. FINDINGS: CT HEAD FINDINGS Brain: Stable left MCA territory chronic cerebral infarct. The brain demonstrates  no evidence of hemorrhage, acute infarction, edema, mass effect, extra-axial fluid collection, hydrocephalus or mass lesion. Vascular: No hyperdense vessel or unexpected calcification. Skull: Normal. Negative for fracture or focal lesion. Sinuses/Orbits: No acute finding. Other: No significant scalp injury or evidence of scalp foreign body. CT CERVICAL SPINE FINDINGS Alignment: The cervical spine CT acquisition was impair by motion. No obvious fracture or subluxation. Skull base and vertebrae: No obvious fracture.  No bony lesions. Soft tissues and spinal canal: No soft tissue swelling or other soft tissue abnormalities identified. Disc levels:  Mild degenerative disc disease, primarily at C5-6. Upper chest: Negative. IMPRESSION: 1. Old left MCA territory cerebral infarct appears stable. No acute findings by head CT. 2. Limited cervical spine evaluation due to motion during the cervical spine CT acquisition. No obvious fracture. Electronically Signed   By: Aletta Edouard M.D.   On: 08/12/2017 20:16   Ct Cervical Spine Wo Contrast  Result Date: 08/12/2017 CLINICAL DATA:  Fall with head injury. History of prior left hemispheric cerebral infarction. EXAM: CT HEAD WITHOUT CONTRAST CT CERVICAL SPINE WITHOUT CONTRAST TECHNIQUE: Multidetector CT imaging of the head and cervical spine was performed following the standard protocol without intravenous contrast. Multiplanar CT image reconstructions of the cervical spine were also generated. COMPARISON:  CT of the head on 12/23/2011. Additional CT studies in 2014 and 2017 currently cannot be retrieved. FINDINGS: CT  HEAD FINDINGS Brain: Stable left MCA territory chronic cerebral infarct. The brain demonstrates no evidence of hemorrhage, acute infarction, edema, mass effect, extra-axial fluid collection, hydrocephalus or mass lesion. Vascular: No hyperdense vessel or unexpected calcification. Skull: Normal. Negative for fracture or focal lesion. Sinuses/Orbits: No acute finding. Other: No significant scalp injury or evidence of scalp foreign body. CT CERVICAL SPINE FINDINGS Alignment: The cervical spine CT acquisition was impair by motion. No obvious fracture or subluxation. Skull base and vertebrae: No obvious fracture.  No bony lesions. Soft tissues and spinal canal: No soft tissue swelling or other soft tissue abnormalities identified. Disc levels:  Mild degenerative disc disease, primarily at C5-6. Upper chest: Negative. IMPRESSION: 1. Old left MCA territory cerebral infarct appears stable. No acute findings by head CT. 2. Limited cervical spine evaluation due to motion during the cervical spine CT acquisition. No obvious fracture. Electronically Signed   By: Aletta Edouard M.D.   On: 08/12/2017 20:16   Dg Knee Complete 4 Views Left  Result Date: 08/12/2017 CLINICAL DATA:  83 year old female with fall and left knee pain. EXAM: LEFT KNEE - COMPLETE 4+ VIEW; LEFT TIBIA AND FIBULA - 2 VIEW COMPARISON:  Left knee radiograph dated 06/30/2017 FINDINGS: There is no acute fracture or dislocation. Distal fibular side plate fixation as well as fixation screws through the medial malleolus noted. The orthopedic hardware appear intact. The bones are osteopenic. No joint effusion. There is diffuse subcutaneous edema. IMPRESSION: No acute fracture or dislocation. Electronically Signed   By: Anner Crete M.D.   On: 08/12/2017 20:35   Dg Hip Unilat With Pelvis 2-3 Views Right  Result Date: 08/12/2017 CLINICAL DATA:  Fall today on left side with right-sided hip pain. Initial encounter. EXAM: DG HIP (WITH OR WITHOUT PELVIS) 2-3V  RIGHT COMPARISON:  None. FINDINGS: No evidence of hip fracture or dislocation. No visible pelvic ring fracture or diastasis. Osteopenia. IMPRESSION: No acute finding. Electronically Signed   By: Monte Fantasia M.D.   On: 08/12/2017 20:42    Procedures Procedures (including critical care time)  Medications Ordered in ED Medications  acetaminophen (TYLENOL) tablet  650 mg (not administered)    Initial Impression / Assessment and Plan / ED Course  I have reviewed the triage vital signs and the nursing notes.  Pertinent labs & imaging results that were available during my care of the patient were reviewed by me and considered in my medical decision making (see chart for details).   Patient on blood thinners presents s/p fall with head injury complaining of pain to the top of her head, lower back, tailbone, R hip, and L knee/lower leg.  Patient is nontoxic appearing with stable vital signs.  Patient on blood thinners with head injury with fall and subsequent lightheadedness and vomiting. No obvious deformity or focal tenderness on exam. Patient is NVI distal to all injuries and without focal neurologic deficit on exam. Will evaluate with CT of head/neck as well as X-rays in areas of discomfort to evaluate for fracture, dislocation, or head bleed. Imaging negative for acute abnormalities. Will treat pain with Tylenol.   21:10: Re-eval: Patient is feeling improved. Discussed imaging results with patient and her son and granddaughter. Patient states she is ready to go home.   Patient is stable and in no apparent distress. I discussed results, RICE and Tylenol, need for PCP follow-up, and return precautions with the patient and her family. Provided opportunity for questions, patient and family confirmed understanding and are in agreement with plan.   Final Clinical Impressions(s) / ED Diagnoses   Final diagnoses:  Fall, initial encounter    ED Discharge Orders    None       Leafy Kindle 08/12/17 2137  Findings and plan of care discussed with supervising physician Dr. Rogene Houston who personally evaluated and examined this patient.     Leafy Kindle 08/12/17 2141    Fredia Sorrow, MD 08/13/17 [phone]

## 2017-08-14 DIAGNOSIS — I482 Chronic atrial fibrillation: Secondary | ICD-10-CM | POA: Diagnosis not present

## 2017-08-14 DIAGNOSIS — I350 Nonrheumatic aortic (valve) stenosis: Secondary | ICD-10-CM | POA: Diagnosis not present

## 2017-08-14 DIAGNOSIS — Z6828 Body mass index (BMI) 28.0-28.9, adult: Secondary | ICD-10-CM | POA: Diagnosis not present

## 2017-08-14 DIAGNOSIS — Z0001 Encounter for general adult medical examination with abnormal findings: Secondary | ICD-10-CM | POA: Diagnosis not present

## 2017-08-14 DIAGNOSIS — E782 Mixed hyperlipidemia: Secondary | ICD-10-CM | POA: Diagnosis not present

## 2017-08-14 DIAGNOSIS — I10 Essential (primary) hypertension: Secondary | ICD-10-CM | POA: Diagnosis not present

## 2017-08-15 DIAGNOSIS — M170 Bilateral primary osteoarthritis of knee: Secondary | ICD-10-CM | POA: Diagnosis not present

## 2017-08-25 DIAGNOSIS — M170 Bilateral primary osteoarthritis of knee: Secondary | ICD-10-CM | POA: Diagnosis not present

## 2017-08-27 DIAGNOSIS — C44319 Basal cell carcinoma of skin of other parts of face: Secondary | ICD-10-CM | POA: Diagnosis not present

## 2017-08-27 DIAGNOSIS — L570 Actinic keratosis: Secondary | ICD-10-CM | POA: Diagnosis not present

## 2017-08-27 DIAGNOSIS — D485 Neoplasm of uncertain behavior of skin: Secondary | ICD-10-CM | POA: Diagnosis not present

## 2017-08-27 DIAGNOSIS — Z85828 Personal history of other malignant neoplasm of skin: Secondary | ICD-10-CM | POA: Diagnosis not present

## 2017-08-28 DIAGNOSIS — I482 Chronic atrial fibrillation: Secondary | ICD-10-CM | POA: Diagnosis not present

## 2017-09-11 DIAGNOSIS — C44319 Basal cell carcinoma of skin of other parts of face: Secondary | ICD-10-CM | POA: Diagnosis not present

## 2017-09-25 DIAGNOSIS — J189 Pneumonia, unspecified organism: Secondary | ICD-10-CM | POA: Diagnosis not present

## 2017-09-25 DIAGNOSIS — Z6827 Body mass index (BMI) 27.0-27.9, adult: Secondary | ICD-10-CM | POA: Diagnosis not present

## 2017-09-25 DIAGNOSIS — R509 Fever, unspecified: Secondary | ICD-10-CM | POA: Diagnosis not present

## 2017-09-25 DIAGNOSIS — J111 Influenza due to unidentified influenza virus with other respiratory manifestations: Secondary | ICD-10-CM | POA: Diagnosis not present

## 2017-09-25 DIAGNOSIS — I482 Chronic atrial fibrillation: Secondary | ICD-10-CM | POA: Diagnosis not present

## 2017-09-30 DIAGNOSIS — I482 Chronic atrial fibrillation: Secondary | ICD-10-CM | POA: Diagnosis not present

## 2017-09-30 DIAGNOSIS — I350 Nonrheumatic aortic (valve) stenosis: Secondary | ICD-10-CM | POA: Diagnosis not present

## 2017-10-09 DIAGNOSIS — M1712 Unilateral primary osteoarthritis, left knee: Secondary | ICD-10-CM | POA: Diagnosis not present

## 2017-10-09 DIAGNOSIS — R2681 Unsteadiness on feet: Secondary | ICD-10-CM | POA: Diagnosis not present

## 2017-10-14 DIAGNOSIS — I482 Chronic atrial fibrillation: Secondary | ICD-10-CM | POA: Diagnosis not present

## 2017-10-14 DIAGNOSIS — I350 Nonrheumatic aortic (valve) stenosis: Secondary | ICD-10-CM | POA: Diagnosis not present

## 2017-10-21 DIAGNOSIS — I482 Chronic atrial fibrillation: Secondary | ICD-10-CM | POA: Diagnosis not present

## 2017-10-21 DIAGNOSIS — I350 Nonrheumatic aortic (valve) stenosis: Secondary | ICD-10-CM | POA: Diagnosis not present

## 2017-10-28 ENCOUNTER — Ambulatory Visit (INDEPENDENT_AMBULATORY_CARE_PROVIDER_SITE_OTHER): Payer: Medicare Other | Admitting: Neurology

## 2017-10-28 ENCOUNTER — Encounter (INDEPENDENT_AMBULATORY_CARE_PROVIDER_SITE_OTHER): Payer: Self-pay

## 2017-10-28 ENCOUNTER — Telehealth: Payer: Self-pay | Admitting: Neurology

## 2017-10-28 ENCOUNTER — Encounter: Payer: Self-pay | Admitting: Neurology

## 2017-10-28 VITALS — BP 124/62 | HR 58 | Wt 165.8 lb

## 2017-10-28 DIAGNOSIS — R251 Tremor, unspecified: Secondary | ICD-10-CM

## 2017-10-28 DIAGNOSIS — G459 Transient cerebral ischemic attack, unspecified: Secondary | ICD-10-CM | POA: Diagnosis not present

## 2017-10-28 DIAGNOSIS — Z8673 Personal history of transient ischemic attack (TIA), and cerebral infarction without residual deficits: Secondary | ICD-10-CM | POA: Diagnosis not present

## 2017-10-28 NOTE — Telephone Encounter (Signed)
10/28/17 Medicare/bcbs supp order sent to [facility]

## 2017-10-28 NOTE — Patient Instructions (Signed)
I had a long discussion with the patient and her granddaughter regarding her tremors and discussed differential diagnosis, evaluation plan and answered questions. The tremors do not appear to be disabling enough at the present time to consider specific medications but we will do diagnostic workup by checking CBC, CMP, TSH, ESR and MRI scan of the brain with MRA of the brain and neck. She will stay on warfarin for stroke prevention given history of atrial fibrillation and maintain strict control of hypertension with blood pressure goal below 130/90 and lipids with LDL cholesterol goal below 70 mg percent. and return for follow-up in 2 months or call earlier if necessary.

## 2017-10-28 NOTE — Progress Notes (Signed)
PATIENT: [name] [name] DOB: 1929/10/31  REASON FOR VISIT: follow up- stroke HISTORY FROM: patient  HISTORY OF PRESENT ILLNESS: Mr. [name] [name] is an 84 year old female with a history of stroke. She returns today for follow-up. The patient continues on Coumadin for stroke prevention. She has her INR checked by her primary care provider. Her primary care also manages her blood pressure and cholesterol. Her blood pressure today is 140/58. She denies any new strokelike symptoms. Since her stroke she's had issues with her balance. Since the last visit the patient was hospitalized for congestive heart failure and the flu. She is now in physical therapy. She reports that this has been beneficial for her gait and balance. She uses a cane or walker intermittently.. The patient does not smoke. She denies any new medical history. She returns today for an evaluation.  UPDATE 10/06/14 (MM): [name] is an 84 year old female with a history of stroke in 2013. He returns today for follow-up. The patient continues to take Coumadin for stroke prevention. Her INR has remained stable. She had it checked this morning and her INR was 2.5. The patient's blood pressure has been under good control. Today it is 139/60. The patient's cholesterol is controlled with Lipitor. She had blood work in January with her PCP. Since the last visit the patient denies any stroke like symptoms. Since her stroke the patient has had some issues with her balance. She states that she will stagger more. She will use a cane with she feels that her balance is off. Denies any falls. Patient is unsure if she snores. She denies daytime sleepiness. Denies waking up with a headache. She states that she feels well rested. No new medical issues since last seen.   History ([name] [name]): 84 year old lady with left MCA branch infarct in May 2013 from atrial fibrillation  She returns for followup after last visit on 07/01/12. She continues to do well and has not  had any recurrence stroke or TIA symptoms. She remains on warfarin which is tolerating well with only minor bruising and no significant bleeding episodes. She states her blood pressure is under good control usually though it is elevated at 178/72 in office today. She is not aware when her last lipid profile was checked but apparently it was okay. She complains of posterior neck and occipital headaches which are intermittent and occur on a daily basis for the last 2-3 weeks. They last usually a few hours and Tylenol provides good relief. The pain is usually pressure-like though it has a sharp component to it also he she also admits to type feeling in the neck muscles. She denies any recent fall head injury.   UPDATE 04/16/13 (LL): Since last visit on 01/06/13, headaches are better, occasional left parietal headache. States had a fall in June, electricity was out, knocked unconscious, walked into wall. Went to [facility]; CT showed bleeding subdural; had confusion, did not feel over it until first of August. They were camping 2 weeks ago, neighbor dog was loose, attacked her dog and she and her husband fell trying to protect their dog. She had bump on head and small wound, but it stopped bleeding, went to [facility] to get checked, CT was negative. Has been feeling a little dizzy today. Comadin checked this morning, PT was 2.6. HR in our office today is 46 at check-in and 48 during exam. She states this is not her usual. Denies CP, nausea, any pain. She states she does check her blood pressure at  home and heart rate is usually in the 60s-70. Does not have cardiologist since Dr. Dannielle Burn left [place]. Dr. Quillian Quince, her PCP handles all of her care. No neurovascular symptoms.  UPDATE 10/22/13 (LL): Mrs. [name] [name] returns for 6 month stroke followup, she states that she has been doing well. She has an occasional headache that is relieved with Tylenol, but no other complaints. She is tolerating Coumadin well without  significant bruising or bleeding, last PT was 2.3. BP in office today is 176/82, states she does not check it at home. Update 03/23/2014 :(PS) : She returns for followup after last visit 5 months ago. She continues to do well from neurovascular standpoint without recurrent stroke or TIA symptoms. She had a followup carotid ultrasound done on 11/03/13 which showed no significant extracranial stenosis. She also had a echocardiogram done by her primary physician which I do not have the results for button apparently showed a small aortic aneurysm which is being followed conservatively. The patient has - ring but has had no near falls. She remains on Coumadin and INR has been quite steady and she has not had any bleeding or bruising issues. Her blood pressure is elevated today at 167/7 one in office and she does not check it regularly. She has intermittent left tremor when she gets anxious but this is not bothersome Update 10/28/2017 : She returns for follow-up after last visit with Ward Givens, NP in February 2016. She is accompanied by her granddaughter. She states that for the last 8 months she has had new onset of tremors in both upper and lower extremities. The tremor somewhat mostly noticed that task like standing on her feet for a while. His tremors are intermittent and they do not last long. If she rests and lies down the tremors usually stop. She denies any excessive drooling of saliva him a bradykinesia or worsening gait or balance problems. She's had no recent falls or injuries. She remains on warfarin for her atrial fibrillation and INR has been fairly stable. She denies any bruising or bleeding. She denies any prior history of benign essential tremor. Is no family history of tremor either. She has had no recent medication changes. She has not had any recent lab work or brain imaging done. REVIEW OF SYSTEMS: Out of a complete 14 system review of symptoms, the patient complains only of the following  symptoms, and all other reviewed systems are negative. Hearing loss, loss of vision, shortness of breath, chest tightness, murmur, walking difficulty, speech difficulty, memory loss, tremors, anxiety and nervousness ALLERGIES: Allergies  Allergen Reactions  . Sulfonamide Derivatives Rash    HOME MEDICATIONS: Outpatient Medications Prior to Visit  Medication Sig Dispense Refill  . acetaminophen (TYLENOL) 500 MG tablet Take 500-1,000 mg by mouth daily as needed for moderate pain.     . ASPIRIN ADULT LOW STRENGTH 81 MG EC tablet Take 81 mg by mouth daily.  12  . atenolol (TENORMIN) 50 MG tablet Take 50 mg by mouth at bedtime.    Marland Kitchen atorvastatin (LIPITOR) 40 MG tablet Take 40 mg by mouth at bedtime.     . Biotin 1000 MCG tablet Take 1,000 mcg by mouth daily.    . Calcium Carbonate-Vitamin D (CALTRATE 600+D) 600-400 MG-UNIT per tablet Take 1 tablet by mouth daily.      . citalopram (CELEXA) 20 MG tablet Take 20 mg by mouth daily.    Marland Kitchen diltiazem (CARDIZEM CD) 240 MG 24 hr capsule Take 240 mg by mouth daily.    Marland Kitchen  furosemide (LASIX) 20 MG tablet Take 20 mg by mouth daily.     Marland Kitchen losartan (COZAAR) 100 MG tablet Take 100 mg by mouth every evening.     . metoprolol succinate (TOPROL-XL) 50 MG 24 hr tablet Take 75 mg by mouth daily.  3  . oseltamivir (TAMIFLU) 75 MG capsule Take 75 mg by mouth 2 (two) times daily.  0  . potassium chloride SA (K-DUR,KLOR-CON) 20 MEQ tablet TAKE ONE TABLET BY MOUTH EVERY DAY FOR LOW POTASSIUM  6  . predniSONE (DELTASONE) 5 MG tablet Take 5 mg by mouth daily.    . ranitidine (ZANTAC) 150 MG tablet Take 150 mg by mouth 2 (two) times daily.  1  . warfarin (COUMADIN) 4 MG tablet Take 2 mg by mouth every evening.     Marland Kitchen azithromycin (ZITHROMAX) 500 MG tablet Take 500 mg by mouth daily.  0  . diltiazem (CARDIZEM) 120 MG tablet Take 120 mg by mouth 2 (two) times daily.  6  . furosemide (LASIX) 40 MG tablet Take 40 mg by mouth daily.  6  . losartan (COZAAR) 50 MG tablet Take 50  mg by mouth daily. for high blood pressure  1   No facility-administered medications prior to visit.     PAST MEDICAL HISTORY: Past Medical History:  Diagnosis Date  . Atrial fibrillation ([place])    On Coumadin  . Coronary atherosclerosis of native coronary artery    Nonobstructive at cardiac catheterization 2004  . Dysphagia   . Essential hypertension, benign   . Hyperlipidemia   . Polymyalgia rheumatica ([place])   . Stroke [facility])     PAST SURGICAL HISTORY: Past Surgical History:  Procedure Laterality Date  . APPENDECTOMY    . COLONOSCOPY  2008   Dr.Fleshiman  . Left ankle    . TOE SURGERY    . TOTAL ABDOMINAL HYSTERECTOMY      FAMILY HISTORY: Family History  Problem Relation Age of Onset  . Stroke Mother   . Hypertension Sister   . Hypertension Brother   . Rheum arthritis Daughter   . Healthy Son   . Healthy Son   . Healthy Son   . Hypertension Sister   . Heart disease Sister   . Heart disease Brother   . Hypertension Brother   . Arthritis Brother   . Colon cancer Brother   . Hypertension Brother   . Coronary artery disease Other     SOCIAL HISTORY: Social History   Socioeconomic History  . Marital status: Married    Spouse name: Gwyndolyn Saxon  . Number of children: 4  . Years of education: 53  . Highest education level: Not on file  Social Needs  . Financial resource strain: Not on file  . Food insecurity - worry: Not on file  . Food insecurity - inability: Not on file  . Transportation needs - medical: Not on file  . Transportation needs - non-medical: Not on file  Occupational History  . Occupation: Retired    Fish farm manager: RETIRED  Tobacco Use  . Smoking status: Never Smoker  . Smokeless tobacco: Never Used  Substance and Sexual Activity  . Alcohol use: No    Alcohol/week: 0.0 oz  . Drug use: No  . Sexual activity: No  Other Topics Concern  . Not on file  Social History Narrative   Patient lives at home with spouse.   Caffeine Use: 2 cups daily       PHYSICAL EXAM  Vitals:   10/28/17  1351  BP: 124/62  Pulse: (!) 58  Weight: 165 lb 12.8 oz (75.2 kg)   Body mass index is 26.36 kg/m.  Generalized: Well developed, in no acute distress   Neurological examination  Mentation: Alert oriented to time, place, history taking. Follows all commands speech and language fluent Cranial nerve II-XII: Pupils were equal round reactive to light. Extraocular movements were full, visual field were full on confrontational test. Facial sensation and strength were normal. Uvula tongue midline. Head turning and shoulder shrug  were normal and symmetric. Motor: The motor testing reveals 5 over 5 strength of all 4 extremities. Good symmetric motor tone is noted throughout. Fine action tremor left greater than right upper extremity. Absent at rest. No bradykinesia. No cogwheel rigidity. Patient was asked to draw a spiral and showed significant tremulousness of the left hand and mild of the right hand. She is able to get up from a chair easily Sensory: Sensory testing is intact to soft touch on all 4 extremities. No evidence of extinction is noted.  Coordination: Cerebellar testing reveals good finger-nose-finger and heel-to-shin bilaterally.  Gait and station: Gait is slightly unsteady. Tandem gait not attempted. Romberg is negative Reflexes: Deep tendon reflexes are symmetric and normal bilaterally.   DIAGNOSTIC DATA (LABS, IMAGING, TESTING) - I reviewed patient records, labs, notes, testing and imaging myself where available.  Lab Results  Component Value Date   WBC 10.5 07/05/2016   HGB 14.1 07/05/2016   HCT 41.7 07/05/2016   MCV 91.4 07/05/2016   PLT 336 07/05/2016      Component Value Date/Time   NA 136 07/05/2016 1855   K 3.5 07/05/2016 1855   CL 99 (L) 07/05/2016 1855   CO2 24 07/05/2016 1855   GLUCOSE 139 (H) 07/05/2016 1855   BUN 17 07/05/2016 1855   CREATININE 1.05 (H) 07/05/2016 1855   CALCIUM 9.5 07/05/2016 1855   PROT 8.2 (H)  07/05/2016 1855   ALBUMIN 4.6 07/05/2016 1855   AST 31 07/05/2016 1855   ALT 29 07/05/2016 1855   ALKPHOS 103 07/05/2016 1855   BILITOT 0.9 07/05/2016 1855   GFRNONAA 47 (L) 07/05/2016 1855   GFRAA 55 (L) 07/05/2016 1855      Lab Results  Component Value Date   TSH 0.548 09/25/2015      ASSESSMENT AND PLAN 84 y.o. year old female  has a past medical history of Atrial fibrillation ([place]), Coronary atherosclerosis of native coronary artery, Dysphagia, Essential hypertension, benign, Hyperlipidemia, Polymyalgia rheumatica ([place]), and Stroke ([place]). here with:  1. History of cardioembolic stroke stroke from atrial fibrillation in 2013 2. Abnormality of gait 3. Tremors mostly with action upper and lower extremity of unclear etiology  I had a long discussion with the patient and her granddaughter regarding her tremors and discussed differential diagnosis, evaluation plan and answered questions. The tremors do not appear to be disabling enough at the present time to consider specific medications but we will do diagnostic workup by checking CBC, CMP, TSH, ESR and MRI scan of the brain with MRA of the brain and neck. She will stay on warfarin for stroke prevention given history of atrial fibrillation and maintain strict control of hypertension with blood pressure goal below 130/90 and lipids with LDL cholesterol goal below 70 mg percent. and return for follow-up in 2 months or call earlier if necessary. Greater than 50% time during this 25 minute visit was spent on counseling and coordination of care about the embolic stroke and new diagnosis of tremors  and answering questions     Antony Contras, MD 10/28/2017, 5:40 PM [facility] [address] [place], [place] [address] [phone]

## 2017-10-29 LAB — CBC WITH DIFFERENTIAL/PLATELET
BASOS: 0 %
Basophils Absolute: 0 10*3/uL (ref 0.0–0.2)
EOS (ABSOLUTE): 0.1 10*3/uL (ref 0.0–0.4)
Eos: 1 %
Hematocrit: 39.4 % (ref 34.0–46.6)
Hemoglobin: 12.9 g/dL (ref 11.1–15.9)
Immature Grans (Abs): 0 10*3/uL (ref 0.0–0.1)
Immature Granulocytes: 0 %
LYMPHS: 10 %
Lymphocytes Absolute: 1.2 10*3/uL (ref 0.7–3.1)
MCH: 30.5 pg (ref 26.6–33.0)
MCHC: 32.7 g/dL (ref 31.5–35.7)
MCV: 93 fL (ref 79–97)
MONOCYTES: 9 %
Monocytes Absolute: 1 10*3/uL — ABNORMAL HIGH (ref 0.1–0.9)
NEUTROS ABS: 9.2 10*3/uL — AB (ref 1.4–7.0)
Neutrophils: 80 %
Platelets: 317 10*3/uL (ref 150–379)
RBC: 4.23 x10E6/uL (ref 3.77–5.28)
RDW: 14.2 % (ref 12.3–15.4)
WBC: 11.5 10*3/uL — ABNORMAL HIGH (ref 3.4–10.8)

## 2017-10-29 LAB — COMPREHENSIVE METABOLIC PANEL
A/G RATIO: 1.7 (ref 1.2–2.2)
ALBUMIN: 4.4 g/dL (ref 3.5–4.7)
ALT: 13 IU/L (ref 0–32)
AST: 15 IU/L (ref 0–40)
Alkaline Phosphatase: 83 IU/L (ref 39–117)
BUN / CREAT RATIO: 19 (ref 12–28)
BUN: 20 mg/dL (ref 8–27)
Bilirubin Total: 0.4 mg/dL (ref 0.0–1.2)
CALCIUM: 9.5 mg/dL (ref 8.7–10.3)
CO2: 22 mmol/L (ref 20–29)
Chloride: 98 mmol/L (ref 96–106)
Creatinine, Ser: 1.04 mg/dL — ABNORMAL HIGH (ref 0.57–1.00)
GFR, EST AFRICAN AMERICAN: 57 mL/min/{1.73_m2} — AB (ref >59)
GFR, EST NON AFRICAN AMERICAN: 49 mL/min/{1.73_m2} — AB (ref >59)
Globulin, Total: 2.6 g/dL (ref 1.5–4.5)
Glucose: 113 mg/dL — ABNORMAL HIGH (ref 65–99)
POTASSIUM: 4.3 mmol/L (ref 3.5–5.2)
Sodium: 142 mmol/L (ref 134–144)
TOTAL PROTEIN: 7 g/dL (ref 6.0–8.5)

## 2017-10-29 LAB — URINALYSIS, ROUTINE W REFLEX MICROSCOPIC
Bilirubin, UA: NEGATIVE
Glucose, UA: NEGATIVE
Ketones, UA: NEGATIVE
Leukocytes, UA: NEGATIVE
NITRITE UA: NEGATIVE
Protein, UA: NEGATIVE
RBC, UA: NEGATIVE
Specific Gravity, UA: 1.01 (ref 1.005–1.030)
Urobilinogen, Ur: 0.2 mg/dL (ref 0.2–1.0)
pH, UA: 7 (ref 5.0–7.5)

## 2017-10-29 LAB — VITAMIN B12: Vitamin B-12: 235 pg/mL (ref 232–1245)

## 2017-10-29 LAB — SEDIMENTATION RATE: Sed Rate: 4 mm/hr (ref 0–40)

## 2017-10-29 LAB — TSH: TSH: 1.21 u[IU]/mL (ref 0.450–4.500)

## 2017-10-30 ENCOUNTER — Telehealth: Payer: Self-pay | Admitting: *Deleted

## 2017-10-30 NOTE — Telephone Encounter (Signed)
-----   Message from Garvin Fila, MD sent at 10/29/2017  5:53 PM EDT ----- [name] [name] inform the patient that her vitamin B12 level is on the lower limit of normal and she is to see her primary care physician Dr. Gar Ponto to discuss possible treatment for this. Her thyroid hormone, urine, CBC and metabolic panel labs all normal

## 2017-10-30 NOTE — Telephone Encounter (Signed)
Spoke to pt and relayed that the labs results were normal, with the exception of low normal B12 level per Dr. Leonie Man.  He recommended to f/u with her pcp, Dr. Gar Ponto for possible treatment.  I will fax copy of lab results to Dr. Quillian Quince [phone]). Fax confirmation received.  Pt verbalized understanding.

## 2017-10-30 NOTE — Telephone Encounter (Signed)
Called to give lab results, no answer, not able to LM.

## 2017-10-31 DIAGNOSIS — D519 Vitamin B12 deficiency anemia, unspecified: Secondary | ICD-10-CM | POA: Diagnosis not present

## 2017-11-04 ENCOUNTER — Ambulatory Visit
Admission: RE | Admit: 2017-11-04 | Discharge: 2017-11-04 | Disposition: A | Discharge status: Home / Self Care | Payer: Medicare Other | Source: Ambulatory Visit | Attending: Neurology | Admitting: Neurology

## 2017-11-04 DIAGNOSIS — G459 Transient cerebral ischemic attack, unspecified: Secondary | ICD-10-CM

## 2017-11-04 MED ORDER — GADOBENATE DIMEGLUMINE 529 MG/ML IV SOLN
15.0000 mL | Freq: Once | INTRAVENOUS | Status: AC | PRN
  Administered 2017-11-04: 15 mL via INTRAVENOUS

## 2017-11-07 DIAGNOSIS — D519 Vitamin B12 deficiency anemia, unspecified: Secondary | ICD-10-CM | POA: Diagnosis not present

## 2017-11-14 DIAGNOSIS — I482 Chronic atrial fibrillation: Secondary | ICD-10-CM | POA: Diagnosis not present

## 2017-11-14 DIAGNOSIS — I5032 Chronic diastolic (congestive) heart failure: Secondary | ICD-10-CM | POA: Diagnosis not present

## 2017-11-14 DIAGNOSIS — R7301 Impaired fasting glucose: Secondary | ICD-10-CM | POA: Diagnosis not present

## 2017-11-14 DIAGNOSIS — E8881 Metabolic syndrome: Secondary | ICD-10-CM | POA: Diagnosis not present

## 2017-11-14 DIAGNOSIS — E782 Mixed hyperlipidemia: Secondary | ICD-10-CM | POA: Diagnosis not present

## 2017-11-14 DIAGNOSIS — I10 Essential (primary) hypertension: Secondary | ICD-10-CM | POA: Diagnosis not present

## 2017-11-14 DIAGNOSIS — Z6826 Body mass index (BMI) 26.0-26.9, adult: Secondary | ICD-10-CM | POA: Diagnosis not present

## 2017-11-14 DIAGNOSIS — N183 Chronic kidney disease, stage 3 (moderate): Secondary | ICD-10-CM | POA: Diagnosis not present

## 2017-11-21 DIAGNOSIS — D519 Vitamin B12 deficiency anemia, unspecified: Secondary | ICD-10-CM | POA: Diagnosis not present

## 2017-11-21 DIAGNOSIS — L03115 Cellulitis of right lower limb: Secondary | ICD-10-CM | POA: Diagnosis not present

## 2017-11-21 DIAGNOSIS — I482 Chronic atrial fibrillation: Secondary | ICD-10-CM | POA: Diagnosis not present

## 2017-11-28 DIAGNOSIS — I482 Chronic atrial fibrillation: Secondary | ICD-10-CM | POA: Diagnosis not present

## 2017-11-28 DIAGNOSIS — I350 Nonrheumatic aortic (valve) stenosis: Secondary | ICD-10-CM | POA: Diagnosis not present

## 2017-11-28 DIAGNOSIS — D519 Vitamin B12 deficiency anemia, unspecified: Secondary | ICD-10-CM | POA: Diagnosis not present

## 2017-12-02 DIAGNOSIS — Z0189 Encounter for other specified special examinations: Secondary | ICD-10-CM | POA: Diagnosis not present

## 2017-12-02 DIAGNOSIS — M79674 Pain in right toe(s): Secondary | ICD-10-CM | POA: Diagnosis not present

## 2017-12-02 DIAGNOSIS — L97514 Non-pressure chronic ulcer of other part of right foot with necrosis of bone: Secondary | ICD-10-CM | POA: Diagnosis not present

## 2017-12-02 DIAGNOSIS — M869 Osteomyelitis, unspecified: Secondary | ICD-10-CM | POA: Diagnosis not present

## 2017-12-04 DIAGNOSIS — I482 Chronic atrial fibrillation: Secondary | ICD-10-CM | POA: Diagnosis not present

## 2017-12-04 DIAGNOSIS — D519 Vitamin B12 deficiency anemia, unspecified: Secondary | ICD-10-CM | POA: Diagnosis not present

## 2017-12-08 DIAGNOSIS — Z8673 Personal history of transient ischemic attack (TIA), and cerebral infarction without residual deficits: Secondary | ICD-10-CM | POA: Diagnosis not present

## 2017-12-08 DIAGNOSIS — I4891 Unspecified atrial fibrillation: Secondary | ICD-10-CM | POA: Diagnosis not present

## 2017-12-08 DIAGNOSIS — Z7901 Long term (current) use of anticoagulants: Secondary | ICD-10-CM | POA: Diagnosis not present

## 2017-12-08 DIAGNOSIS — M25539 Pain in unspecified wrist: Secondary | ICD-10-CM | POA: Diagnosis not present

## 2017-12-08 DIAGNOSIS — I110 Hypertensive heart disease with heart failure: Secondary | ICD-10-CM | POA: Diagnosis not present

## 2017-12-08 DIAGNOSIS — L97519 Non-pressure chronic ulcer of other part of right foot with unspecified severity: Secondary | ICD-10-CM | POA: Diagnosis not present

## 2017-12-08 DIAGNOSIS — Z7982 Long term (current) use of aspirin: Secondary | ICD-10-CM | POA: Diagnosis not present

## 2017-12-08 DIAGNOSIS — I509 Heart failure, unspecified: Secondary | ICD-10-CM | POA: Diagnosis not present

## 2017-12-08 DIAGNOSIS — Z79899 Other long term (current) drug therapy: Secondary | ICD-10-CM | POA: Diagnosis not present

## 2017-12-08 DIAGNOSIS — Z7952 Long term (current) use of systemic steroids: Secondary | ICD-10-CM | POA: Diagnosis not present

## 2017-12-08 DIAGNOSIS — M868X7 Other osteomyelitis, ankle and foot: Secondary | ICD-10-CM | POA: Diagnosis not present

## 2017-12-08 DIAGNOSIS — M1990 Unspecified osteoarthritis, unspecified site: Secondary | ICD-10-CM | POA: Diagnosis not present

## 2017-12-08 DIAGNOSIS — M79674 Pain in right toe(s): Secondary | ICD-10-CM | POA: Diagnosis not present

## 2017-12-08 DIAGNOSIS — Z89421 Acquired absence of other right toe(s): Secondary | ICD-10-CM | POA: Diagnosis not present

## 2017-12-08 DIAGNOSIS — Z8249 Family history of ischemic heart disease and other diseases of the circulatory system: Secondary | ICD-10-CM | POA: Diagnosis not present

## 2017-12-08 DIAGNOSIS — Z882 Allergy status to sulfonamides status: Secondary | ICD-10-CM | POA: Diagnosis not present

## 2017-12-08 DIAGNOSIS — Z90710 Acquired absence of both cervix and uterus: Secondary | ICD-10-CM | POA: Diagnosis not present

## 2017-12-10 DIAGNOSIS — Z7982 Long term (current) use of aspirin: Secondary | ICD-10-CM | POA: Diagnosis not present

## 2017-12-10 DIAGNOSIS — Z79899 Other long term (current) drug therapy: Secondary | ICD-10-CM | POA: Diagnosis not present

## 2017-12-10 DIAGNOSIS — L97519 Non-pressure chronic ulcer of other part of right foot with unspecified severity: Secondary | ICD-10-CM | POA: Diagnosis not present

## 2017-12-10 DIAGNOSIS — M19071 Primary osteoarthritis, right ankle and foot: Secondary | ICD-10-CM | POA: Diagnosis not present

## 2017-12-10 DIAGNOSIS — L97514 Non-pressure chronic ulcer of other part of right foot with necrosis of bone: Secondary | ICD-10-CM | POA: Diagnosis not present

## 2017-12-10 DIAGNOSIS — Z7901 Long term (current) use of anticoagulants: Secondary | ICD-10-CM | POA: Diagnosis not present

## 2017-12-10 DIAGNOSIS — M868X7 Other osteomyelitis, ankle and foot: Secondary | ICD-10-CM | POA: Diagnosis not present

## 2017-12-10 DIAGNOSIS — M79674 Pain in right toe(s): Secondary | ICD-10-CM | POA: Diagnosis not present

## 2017-12-10 DIAGNOSIS — M869 Osteomyelitis, unspecified: Secondary | ICD-10-CM | POA: Diagnosis not present

## 2017-12-23 DIAGNOSIS — I482 Chronic atrial fibrillation: Secondary | ICD-10-CM | POA: Diagnosis not present

## 2017-12-23 DIAGNOSIS — D519 Vitamin B12 deficiency anemia, unspecified: Secondary | ICD-10-CM | POA: Diagnosis not present

## 2018-01-13 DIAGNOSIS — D519 Vitamin B12 deficiency anemia, unspecified: Secondary | ICD-10-CM | POA: Diagnosis not present

## 2018-01-13 DIAGNOSIS — I482 Chronic atrial fibrillation: Secondary | ICD-10-CM | POA: Diagnosis not present

## 2018-01-20 ENCOUNTER — Ambulatory Visit (INDEPENDENT_AMBULATORY_CARE_PROVIDER_SITE_OTHER): Payer: Medicare Other | Admitting: Neurology

## 2018-01-20 ENCOUNTER — Encounter: Payer: Self-pay | Admitting: Neurology

## 2018-01-20 VITALS — BP 157/66 | HR 71 | Ht 66.5 in | Wt 163.4 lb

## 2018-01-20 DIAGNOSIS — G44209 Tension-type headache, unspecified, not intractable: Secondary | ICD-10-CM | POA: Diagnosis not present

## 2018-01-20 NOTE — Progress Notes (Signed)
PATIENT: [name] [name] DOB: 04/11/1930  REASON FOR VISIT: follow up- stroke HISTORY FROM: patient  HISTORY OF PRESENT ILLNESS: [name] [name] is an 84 year old female with a history of stroke. She returns today for follow-up. The patient continues on Coumadin for stroke prevention. She has her INR checked by her primary care provider. Her primary care also manages her blood pressure and cholesterol. Her blood pressure today is 140/58. She denies any new strokelike symptoms. Since her stroke she's had issues with her balance. Since the last visit the patient was hospitalized for congestive heart failure and the flu. She is now in physical therapy. She reports that this has been beneficial for her gait and balance. She uses a cane or walker intermittently.. The patient does not smoke. She denies any new medical history. She returns today for an evaluation.  UPDATE 10/06/14 (MM): [name] [name] is an 84 year old female with a history of stroke in 2013. He returns today for follow-up. The patient continues to take Coumadin for stroke prevention. Her INR has remained stable. She had it checked this morning and her INR was 2.5. The patient's blood pressure has been under good control. Today it is 139/60. The patient's cholesterol is controlled with Lipitor. She had blood work in January with her PCP. Since the last visit the patient denies any stroke like symptoms. Since her stroke the patient has had some issues with her balance. She states that she will stagger more. She will use a cane with she feels that her balance is off. Denies any falls. Patient is unsure if she snores. She denies daytime sleepiness. Denies waking up with a headache. She states that she feels well rested. No new medical issues since last seen.   History ([name]): 84 year old lady with left MCA branch infarct in May 2013 from atrial fibrillation  She returns for followup after last visit on 07/01/12. She continues to do well and has not  had any recurrence stroke or TIA symptoms. She remains on warfarin which is tolerating well with only minor bruising and no significant bleeding episodes. She states her blood pressure is under good control usually though it is elevated at 178/72 in office today. She is not aware when her last lipid profile was checked but apparently it was okay. She complains of posterior neck and occipital headaches which are intermittent and occur on a daily basis for the last 2-3 weeks. They last usually a few hours and Tylenol provides good relief. The pain is usually pressure-like though it has a sharp component to it also he she also admits to type feeling in the neck muscles. She denies any recent fall head injury.   UPDATE 04/16/13 (LL): Since last visit on 01/06/13, headaches are better, occasional left parietal headache. States had a fall in June, electricity was out, knocked unconscious, walked into wall. Went to [facility]; CT showed bleeding subdural; had confusion, did not feel over it until first of August. They were camping 2 weeks ago, neighbor dog was loose, attacked her dog and she and her husband fell trying to protect their dog. She had bump on head and small wound, but it stopped bleeding, went to [facility] to get checked, CT was negative. Has been feeling a little dizzy today. Comadin checked this morning, PT was 2.6. HR in our office today is 46 at check-in and 48 during exam. She states this is not her usual. Denies CP, nausea, any pain. She states she does check her blood pressure at  home and heart rate is usually in the 60s-70. Does not have cardiologist since Dr. Dannielle Burn left [place]. Dr. Quillian Quince, her PCP handles all of her care. No neurovascular symptoms.  UPDATE 10/22/13 (LL): [name] [name] returns for 6 month stroke followup, she states that she has been doing well. She has an occasional headache that is relieved with Tylenol, but no other complaints. She is tolerating Coumadin well without  significant bruising or bleeding, last PT was 2.3. BP in office today is 176/82, states she does not check it at home. Update 03/23/2014 :(PS) : She returns for followup after last visit 5 months ago. She continues to do well from neurovascular standpoint without recurrent stroke or TIA symptoms. She had a followup carotid ultrasound done on 11/03/13 which showed no significant extracranial stenosis. She also had a echocardiogram done by her primary physician which I do not have the results for button apparently showed a small aortic aneurysm which is being followed conservatively. The patient has - ring but has had no near falls. She remains on Coumadin and INR has been quite steady and she has not had any bleeding or bruising issues. Her blood pressure is elevated today at 167/7 one in office and she does not check it regularly. She has intermittent left tremor when she gets anxious but this is not bothersome Update 10/28/2017 : She returns for follow-up after last visit with Ward Givens, NP in February 2016. She is accompanied by her granddaughter. She states that for the last 8 months she has had new onset of tremors in both upper and lower extremities. The tremor somewhat mostly noticed that task like standing on her feet for a while. His tremors are intermittent and they do not last long. If she rests and lies down the tremors usually stop. She denies any excessive drooling of saliva him a bradykinesia or worsening gait or balance problems. She's had no recent falls or injuries. She remains on warfarin for her atrial fibrillation and INR has been fairly stable. She denies any bruising or bleeding. She denies any prior history of benign essential tremor. Is no family history of tremor either. She has had no recent medication changes. She has not had any recent lab work or brain imaging done. Update 01/20/2018 : She returns for follow-up after last visit 3 months ago.  She is accompanied by her son.  Patient  states her tremors seem to be improving and they are not as bothersome.  She still notices tremors in the left hand mainly with certain activities.  She did undergo lab work at last visit which was fairly unremarkable except for low normal vitamin B12 levels of 235.  She has since seen her primary physician Dr. Olena Heckle who started her on B12 shots.  She had an ESR which was 4 mm.  The patient states she has been under increased stress as her husband is essentially bedridden and on home hospice.  He has been in and out of the hospital.  She complains of left temporal and occipital headaches which she describes as 5/10 in severity mostly tightness sensation.  This is not associated with any nausea, loss of vision or double vision.  She does take Tylenol occasionally which provides relief.  She also had right toe amputated and is wearing a boot to ambulate. REVIEW OF SYSTEMS: Out of a complete 14 system review of symptoms, the patient complains only of the following symptoms, and all other reviewed systems are negative. Daytime sleepiness, blurred vision, anemia,  headache, joint pain, back pain, walking difficulty, tremors, anxiety and nervousness ALLERGIES: Allergies  Allergen Reactions  . Sulfonamide Derivatives Rash    HOME MEDICATIONS: Outpatient Medications Prior to Visit  Medication Sig Dispense Refill  . acetaminophen (TYLENOL) 500 MG tablet Take 500-1,000 mg by mouth daily as needed for moderate pain.     . ASPIRIN ADULT LOW STRENGTH 81 MG EC tablet Take 81 mg by mouth daily.  12  . atenolol (TENORMIN) 50 MG tablet Take 50 mg by mouth at bedtime.    Marland Kitchen atorvastatin (LIPITOR) 40 MG tablet Take 40 mg by mouth at bedtime.     . Biotin 1000 MCG tablet Take 1,000 mcg by mouth daily.    . Calcium Carbonate-Vitamin D (CALTRATE 600+D) 600-400 MG-UNIT per tablet Take 1 tablet by mouth daily.      . citalopram (CELEXA) 20 MG tablet Take 20 mg by mouth daily.    Marland Kitchen dicyclomine (BENTYL) 20 MG tablet TAKE 1  TABLET BY MOUTH THREE TIMES DAILY FOR BOWEL PROBLEMS  1  . diltiazem (CARDIZEM CD) 240 MG 24 hr capsule Take 240 mg by mouth daily.    . furosemide (LASIX) 20 MG tablet Take 20 mg by mouth daily.     Marland Kitchen HYDROcodone-acetaminophen (NORCO/VICODIN) 5-325 MG tablet Take 1 tablet by mouth every 6 (six) hours as needed. for pain  0  . losartan (COZAAR) 100 MG tablet Take 100 mg by mouth every evening.     . metoprolol succinate (TOPROL-XL) 50 MG 24 hr tablet Take 75 mg by mouth daily.  3  . oseltamivir (TAMIFLU) 75 MG capsule Take 75 mg by mouth 2 (two) times daily.  0  . potassium chloride SA (K-DUR,KLOR-CON) 20 MEQ tablet TAKE ONE TABLET BY MOUTH EVERY DAY FOR LOW POTASSIUM  6  . predniSONE (DELTASONE) 5 MG tablet Take 5 mg by mouth daily.    . ranitidine (ZANTAC) 150 MG tablet Take 150 mg by mouth 2 (two) times daily.  1  . warfarin (COUMADIN) 4 MG tablet Take 2 mg by mouth every evening.     . diltiazem (CARDIZEM) 120 MG tablet Take 120 mg by mouth 2 (two) times daily.  1  . furosemide (LASIX) 40 MG tablet Take 40 mg by mouth daily.  6  . losartan (COZAAR) 50 MG tablet Take 50 mg by mouth daily. for high blood pressure  1   No facility-administered medications prior to visit.     PAST MEDICAL HISTORY: Past Medical History:  Diagnosis Date  . Atrial fibrillation ([place])    On Coumadin  . Coronary atherosclerosis of native coronary artery    Nonobstructive at cardiac catheterization 2004  . Dysphagia   . Essential hypertension, benign   . Hyperlipidemia   . Polymyalgia rheumatica ([place])   . Stroke [facility])     PAST SURGICAL HISTORY: Past Surgical History:  Procedure Laterality Date  . APPENDECTOMY    . COLONOSCOPY  2008   Dr.Fleshiman  . Left ankle    . TOE SURGERY    . TOTAL ABDOMINAL HYSTERECTOMY      FAMILY HISTORY: Family History  Problem Relation Age of Onset  . Stroke Mother   . Hypertension Sister   . Hypertension Brother   . Rheum arthritis Daughter   . Healthy Son   .  Healthy Son   . Healthy Son   . Hypertension Sister   . Heart disease Sister   . Heart disease Brother   . Hypertension Brother   .  Arthritis Brother   . Colon cancer Brother   . Hypertension Brother   . Coronary artery disease Other     SOCIAL HISTORY: Social History   Socioeconomic History  . Marital status: Married    Spouse name: Gwyndolyn Saxon  . Number of children: 4  . Years of education: 40  . Highest education level: Not on file  Occupational History  . Occupation: Retired    Fish farm manager: RETIRED  Social Needs  . Financial resource strain: Not on file  . Food insecurity:    Worry: Not on file    Inability: Not on file  . Transportation needs:    Medical: Not on file    Non-medical: Not on file  Tobacco Use  . Smoking status: Never Smoker  . Smokeless tobacco: Never Used  Substance and Sexual Activity  . Alcohol use: No    Alcohol/week: 0.0 oz  . Drug use: No  . Sexual activity: Never  Lifestyle  . Physical activity:    Days per week: Not on file    Minutes per session: Not on file  . Stress: Not on file  Relationships  . Social connections:    Talks on phone: Not on file    Gets together: Not on file    Attends religious service: Not on file    Active member of club or organization: Not on file    Attends meetings of clubs or organizations: Not on file    Relationship status: Not on file  . Intimate partner violence:    Fear of current or ex partner: Not on file    Emotionally abused: Not on file    Physically abused: Not on file    Forced sexual activity: Not on file  Other Topics Concern  . Not on file  Social History Narrative   Patient lives at home with spouse.   Caffeine Use: 2 cups daily      PHYSICAL EXAM  Vitals:   01/20/18 1129  BP: (!) 157/66  Pulse: 71  Weight: 163 lb 6.4 oz (74.1 kg)  Height: 5' 6.5" (1.689 m)   Body mass index is 25.98 kg/m.  Generalized: Well developed, in no acute distress  . Afebrile. Head is nontraumatic.  Neck is supple without bruit.    Cardiac exam no murmur or gallop. Lungs are clear to auscultation. Distal pulses are well felt.  Mild restriction of neck movements to the left due to muscle tightness Superficial temporal pulses are well felt.  No scalp tenderness.  Soft right carotid bruit. Neurological examination  Mentation: Alert oriented to time, place, history taking. Follows all commands speech and language fluent Cranial nerve II-XII: Pupils were equal round reactive to light. Extraocular movements were full, visual field were full on confrontational test. Facial sensation and strength were normal. Uvula tongue midline. Head turning and shoulder shrug  were normal and symmetric. Motor: The motor testing reveals 5 over 5 strength of all 4 extremities. Good symmetric motor tone is noted throughout. Fine action tremor left greater than right upper extremity. Absent at rest. No bradykinesia. No cogwheel rigidity. Patient was asked to draw a spiral and showed significant tremulousness of the left hand and mild of the right hand. She is able to get up from a chair easily Sensory: Sensory testing is intact to soft touch on all 4 extremities. No evidence of extinction is noted.  Coordination: Cerebellar testing reveals good finger-nose-finger and heel-to-shin bilaterally.  Gait and station: Gait is slightly unsteady.  Uses  a cane.  Wearing a boot on the right foot due to recent toe amputation surgery tandem gait not attempted. Romberg is negative Reflexes: Deep tendon reflexes are symmetric and normal bilaterally.   DIAGNOSTIC DATA (LABS, IMAGING, TESTING) - I reviewed patient records, labs, notes, testing and imaging myself where available.  Lab Results  Component Value Date   WBC 11.5 (H) 10/28/2017   HGB 12.9 10/28/2017   HCT 39.4 10/28/2017   MCV 93 10/28/2017   PLT 317 10/28/2017      Component Value Date/Time   NA 142 10/28/2017 1422   K 4.3 10/28/2017 1422   CL 98 10/28/2017 1422    CO2 22 10/28/2017 1422   GLUCOSE 113 (H) 10/28/2017 1422   GLUCOSE 139 (H) 07/05/2016 1855   BUN 20 10/28/2017 1422   CREATININE 1.04 (H) 10/28/2017 1422   CALCIUM 9.5 10/28/2017 1422   PROT 7.0 10/28/2017 1422   ALBUMIN 4.4 10/28/2017 1422   AST 15 10/28/2017 1422   ALT 13 10/28/2017 1422   ALKPHOS 83 10/28/2017 1422   BILITOT 0.4 10/28/2017 1422   GFRNONAA 49 (L) 10/28/2017 1422   GFRAA 57 (L) 10/28/2017 1422      Lab Results  Component Value Date   TSH 1.210 10/28/2017      ASSESSMENT AND PLAN 84 y.o. year old female  has a past medical history of Atrial fibrillation ([place]), Coronary atherosclerosis of native coronary artery, Dysphagia, Essential hypertension, benign, Hyperlipidemia, Polymyalgia rheumatica ([place]), and Stroke ([place]). here with:  1. History of cardioembolic stroke stroke from atrial fibrillation in 2013 2. Abnormality of gait 3. Tremors mostly with action upper and lower extremity of unclear etiology but improving 4. New mild temporal and occipital headaches -muscle tension headaches likely  I had a long discussion with the patient and her son regarding her new complaints of left temporal and posterior neck headache which likely sound like tension headaches.  I recommend she do regular neck stretching exercises as well as participate in regular activities for stress relaxation like exercise, meditation and yoga.  She may use Tylenol as needed for symptomatic relief but limited to less than 2 days/week.  She will continue Coumadin for stroke prevention with target INR goal between 2 and 3.  Maintain strict control of hypertension with blood pressure goal between below 130/90 and lipids with LDL cholesterol goal below 70 mg percent.  She will return for follow-up in 6 months with my nurse practitioner call earlier if necessaryGreater than 50% time during this 25 minute visit was spent on counseling and coordination of care about the embolic stroke and new diagnosis of  tension headaches and answering questions     Antony Contras, MD 01/20/2018, 12:42 PM [facility] [address], [place], [place] [address] [phone]

## 2018-01-20 NOTE — Patient Instructions (Signed)
I had a long discussion with the patient and her son regarding her new complaints of left temporal and posterior neck headache which likely sound like tension headaches.  I recommend she do regular neck stretching exercises as well as participate in regular activities for stress relaxation like exercise, meditation and yoga.  She may use Tylenol as needed for symptomatic relief but limited to less than 2 days/week.  She will continue Coumadin for stroke prevention with target INR goal between 2 and 3.  Maintain strict control of hypertension with blood pressure goal between below 1 three 0/9 0 and lipids with LDL cholesterol goal below 70 mg percent.  She will return for follow-up in 6 months with my nurse practitioner call earlier if necessary   Tension Headache A tension headache is a feeling of pain, pressure, or aching that is often felt over the front and sides of the head. The pain can be dull, or it can feel tight (constricting). Tension headaches are not normally associated with nausea or vomiting, and they do not get worse with physical activity. Tension headaches can last from 30 minutes to several days. This is the most common type of headache. CAUSES The exact cause of this condition is not known. Tension headaches often begin after stress, anxiety, or depression. Other triggers may include:  Alcohol.  Too much caffeine, or caffeine withdrawal.  Respiratory infections, such as colds, flu, or sinus infections.  Dental problems or teeth clenching.  Fatigue.  Holding your head and neck in the same position for a long period of time, such as while using a computer.  Smoking. SYMPTOMS Symptoms of this condition include:  A feeling of pressure around the head.  Dull, aching head pain.  Pain felt over the front and sides of the head.  Tenderness in the muscles of the head, neck, and shoulders. DIAGNOSIS This condition may be diagnosed based on your symptoms and a physical exam.  Tests may be done, such as a CT scan or an MRI of your head. These tests may be done if your symptoms are severe or unusual. TREATMENT This condition may be treated with lifestyle changes and medicines to help relieve symptoms. HOME CARE INSTRUCTIONS Managing Pain  Take over-the-counter and prescription medicines only as told by your health care provider.  Lie down in a dark, quiet room when you have a headache.  If directed, apply ice to the head and neck area: ? Put ice in a plastic bag. ? Place a towel between your skin and the bag. ? Leave the ice on for 20 minutes, 2-3 times per day.  Use a heating pad or a hot shower to apply heat to the head and neck area as told by your health care provider. Eating and Drinking  Eat meals on a regular schedule.  Limit alcohol use.  Decrease your caffeine intake, or stop using caffeine. General Instructions  Keep all follow-up visits as told by your health care provider. This is important.  Keep a headache journal to help find out what may trigger your headaches. For example, write down: ? What you eat and drink. ? How much sleep you get. ? Any change to your diet or medicines.  Try massage or other relaxation techniques.  Limit stress.  Sit up straight, and avoid tensing your muscles.  Do not use tobacco products, including cigarettes, chewing tobacco, or e-cigarettes. If you need help quitting, ask your health care provider.  Exercise regularly as told by your health care  provider.  Get 7-9 hours of sleep, or the amount recommended by your health care provider. SEEK MEDICAL CARE IF:  Your symptoms are not helped by medicine.  You have a headache that is different from what you normally experience.  You have nausea or you vomit.  You have a fever. SEEK IMMEDIATE MEDICAL CARE IF:  Your headache becomes severe.  You have repeated vomiting.  You have a stiff neck.  You have a loss of vision.  You have problems with  speech.  You have pain in your eye or ear.  You have muscular weakness or loss of muscle control.  You lose your balance or you have trouble walking.  You feel faint or you pass out.  You have confusion. This information is not intended to replace advice given to you by your health care provider. Make sure you discuss any questions you have with your health care provider. Document Released: 08/05/2005 Document Revised: 04/26/2015 Document Reviewed: 11/28/2014 Elsevier Interactive Patient Education  2017 [employer].

## 2018-01-21 ENCOUNTER — Encounter (HOSPITAL_COMMUNITY): Payer: Self-pay | Admitting: *Deleted

## 2018-01-21 ENCOUNTER — Other Ambulatory Visit: Payer: Self-pay

## 2018-01-21 ENCOUNTER — Observation Stay (HOSPITAL_COMMUNITY)
Admission: EM | Admit: 2018-01-21 | Discharge: 2018-01-22 | Disposition: A | Discharge status: Home / Self Care | Payer: Medicare Other | Attending: Internal Medicine | Admitting: Internal Medicine

## 2018-01-21 ENCOUNTER — Emergency Department (HOSPITAL_COMMUNITY): Payer: Medicare Other

## 2018-01-21 DIAGNOSIS — R251 Tremor, unspecified: Secondary | ICD-10-CM | POA: Diagnosis not present

## 2018-01-21 DIAGNOSIS — I2510 Atherosclerotic heart disease of native coronary artery without angina pectoris: Secondary | ICD-10-CM | POA: Insufficient documentation

## 2018-01-21 DIAGNOSIS — R079 Chest pain, unspecified: Secondary | ICD-10-CM | POA: Diagnosis present

## 2018-01-21 DIAGNOSIS — G20 Parkinson's disease: Secondary | ICD-10-CM | POA: Diagnosis not present

## 2018-01-21 DIAGNOSIS — R531 Weakness: Secondary | ICD-10-CM | POA: Diagnosis not present

## 2018-01-21 DIAGNOSIS — I110 Hypertensive heart disease with heart failure: Secondary | ICD-10-CM | POA: Insufficient documentation

## 2018-01-21 DIAGNOSIS — I5030 Unspecified diastolic (congestive) heart failure: Secondary | ICD-10-CM | POA: Insufficient documentation

## 2018-01-21 DIAGNOSIS — R0602 Shortness of breath: Secondary | ICD-10-CM | POA: Diagnosis not present

## 2018-01-21 DIAGNOSIS — R072 Precordial pain: Principal | ICD-10-CM | POA: Insufficient documentation

## 2018-01-21 LAB — CBC WITH DIFFERENTIAL/PLATELET
BASOS ABS: 0 10*3/uL (ref 0.0–0.1)
Basophils Relative: 0 %
Eosinophils Absolute: 0.2 10*3/uL (ref 0.0–0.7)
Eosinophils Relative: 1 %
HEMATOCRIT: 42.1 % (ref 36.0–46.0)
Hemoglobin: 13.6 g/dL (ref 12.0–15.0)
LYMPHS ABS: 2.5 10*3/uL (ref 0.7–4.0)
LYMPHS PCT: 22 %
MCH: 30 pg (ref 26.0–34.0)
MCHC: 32.3 g/dL (ref 30.0–36.0)
MCV: 92.7 fL (ref 78.0–100.0)
MONO ABS: 1.1 10*3/uL — AB (ref 0.1–1.0)
MONOS PCT: 10 %
NEUTROS ABS: 7.5 10*3/uL (ref 1.7–7.7)
Neutrophils Relative %: 67 %
PLATELETS: 328 10*3/uL (ref 150–400)
RBC: 4.54 MIL/uL (ref 3.87–5.11)
RDW: 14.6 % (ref 11.5–15.5)
WBC: 11.4 10*3/uL — ABNORMAL HIGH (ref 4.0–10.5)

## 2018-01-21 LAB — URINALYSIS, ROUTINE W REFLEX MICROSCOPIC
Bilirubin Urine: NEGATIVE
Glucose, UA: NEGATIVE mg/dL
Hgb urine dipstick: NEGATIVE
KETONES UR: NEGATIVE mg/dL
LEUKOCYTES UA: NEGATIVE
NITRITE: NEGATIVE
PROTEIN: NEGATIVE mg/dL
Specific Gravity, Urine: 1.005 (ref 1.005–1.030)
pH: 7 (ref 5.0–8.0)

## 2018-01-21 LAB — PROTIME-INR
INR: 2.57
Prothrombin Time: 27.4 seconds — ABNORMAL HIGH (ref 11.4–15.2)

## 2018-01-21 LAB — BASIC METABOLIC PANEL
ANION GAP: 16 — AB (ref 5–15)
BUN: 15 mg/dL (ref 6–20)
CO2: 22 mmol/L (ref 22–32)
Calcium: 9.3 mg/dL (ref 8.9–10.3)
Chloride: 104 mmol/L (ref 101–111)
Creatinine, Ser: 0.82 mg/dL (ref 0.44–1.00)
GFR calc Af Amer: >60 mL/min (ref >60)
GLUCOSE: 113 mg/dL — AB (ref 65–99)
POTASSIUM: 3.3 mmol/L — AB (ref 3.5–5.1)
Sodium: 142 mmol/L (ref 135–145)

## 2018-01-21 LAB — TROPONIN I
Troponin I: <0.03 ng/mL (ref <0.03)
Troponin I: <0.03 ng/mL (ref <0.03)

## 2018-01-21 LAB — BRAIN NATRIURETIC PEPTIDE: B Natriuretic Peptide: 440 pg/mL — ABNORMAL HIGH (ref 0.0–100.0)

## 2018-01-21 MED ORDER — FUROSEMIDE 20 MG PO TABS
20.0000 mg | ORAL_TABLET | Freq: Every day | ORAL | Status: DC
  Administered 2018-01-22: 20 mg via ORAL
  Filled 2018-01-21: qty 1

## 2018-01-21 MED ORDER — BIOTIN 1000 MCG PO TABS
1000.0000 ug | ORAL_TABLET | Freq: Every day | ORAL | Status: DC
Start: 2018-01-21 — End: 2018-01-22
  Filled 2018-01-21 (×3): qty 1

## 2018-01-21 MED ORDER — ATORVASTATIN CALCIUM 40 MG PO TABS
40.0000 mg | ORAL_TABLET | Freq: Every day | ORAL | Status: DC
  Administered 2018-01-21: 40 mg via ORAL
  Filled 2018-01-21: qty 1

## 2018-01-21 MED ORDER — PREDNISONE 10 MG PO TABS
5.0000 mg | ORAL_TABLET | Freq: Every day | ORAL | Status: DC
  Administered 2018-01-22: 5 mg via ORAL
  Filled 2018-01-21: qty 1

## 2018-01-21 MED ORDER — ATENOLOL 25 MG PO TABS
50.0000 mg | ORAL_TABLET | Freq: Every day | ORAL | Status: DC
  Administered 2018-01-21: 50 mg via ORAL
  Filled 2018-01-21: qty 2

## 2018-01-21 MED ORDER — ASPIRIN EC 81 MG PO TBEC
81.0000 mg | DELAYED_RELEASE_TABLET | Freq: Every day | ORAL | Status: DC
  Administered 2018-01-22: 81 mg via ORAL
  Filled 2018-01-21: qty 1

## 2018-01-21 MED ORDER — FUROSEMIDE 10 MG/ML IJ SOLN
40.0000 mg | Freq: Once | INTRAMUSCULAR | Status: AC
  Administered 2018-01-21: 40 mg via INTRAVENOUS
  Filled 2018-01-21: qty 4

## 2018-01-21 MED ORDER — ADULT MULTIVITAMIN W/MINERALS CH
1.0000 | ORAL_TABLET | Freq: Every day | ORAL | Status: DC
  Administered 2018-01-22: 1 via ORAL
  Filled 2018-01-21: qty 1

## 2018-01-21 MED ORDER — HYDROCODONE-ACETAMINOPHEN 5-325 MG PO TABS: 1.0000 | ORAL_TABLET | ORAL | Status: DC | PRN

## 2018-01-21 MED ORDER — WARFARIN SODIUM 2 MG PO TABS
2.0000 mg | ORAL_TABLET | Freq: Once | ORAL | Status: AC
  Administered 2018-01-21: 2 mg via ORAL
  Filled 2018-01-21: qty 1

## 2018-01-21 MED ORDER — ALBUTEROL SULFATE (2.5 MG/3ML) 0.083% IN NEBU: 2.5000 mg | INHALATION_SOLUTION | Freq: Four times a day (QID) | RESPIRATORY_TRACT | Status: DC | PRN

## 2018-01-21 MED ORDER — CITALOPRAM HYDROBROMIDE 20 MG PO TABS
20.0000 mg | ORAL_TABLET | Freq: Every day | ORAL | Status: DC
  Administered 2018-01-22: 20 mg via ORAL
  Filled 2018-01-21: qty 1

## 2018-01-21 MED ORDER — WARFARIN SODIUM 2 MG PO TABS
2.0000 mg | ORAL_TABLET | ORAL | Status: DC
Start: 2018-01-21 — End: 2018-01-21

## 2018-01-21 MED ORDER — ONDANSETRON HCL 4 MG PO TABS: 4.0000 mg | ORAL_TABLET | Freq: Four times a day (QID) | ORAL | Status: DC | PRN

## 2018-01-21 MED ORDER — ACETAMINOPHEN 325 MG PO TABS: 650.0000 mg | ORAL_TABLET | Freq: Four times a day (QID) | ORAL | Status: DC | PRN

## 2018-01-21 MED ORDER — HYDROCODONE-ACETAMINOPHEN 5-325 MG PO TABS: 1.0000 | ORAL_TABLET | Freq: Four times a day (QID) | ORAL | Status: DC | PRN

## 2018-01-21 MED ORDER — WARFARIN - PHARMACIST DOSING INPATIENT: Freq: Every day | Status: DC

## 2018-01-21 MED ORDER — LOSARTAN POTASSIUM 50 MG PO TABS: 100.0000 mg | ORAL_TABLET | Freq: Every evening | ORAL | Status: DC

## 2018-01-21 MED ORDER — ACETAMINOPHEN 650 MG RE SUPP: 650.0000 mg | Freq: Four times a day (QID) | RECTAL | Status: DC | PRN

## 2018-01-21 MED ORDER — ONDANSETRON HCL 4 MG/2ML IJ SOLN: 4.0000 mg | Freq: Four times a day (QID) | INTRAMUSCULAR | Status: DC | PRN

## 2018-01-21 MED ORDER — CALCIUM CARBONATE-VITAMIN D 600-400 MG-UNIT PO TABS: 1.0000 | ORAL_TABLET | Freq: Every day | ORAL | Status: DC

## 2018-01-21 MED ORDER — DILTIAZEM HCL ER COATED BEADS 240 MG PO CP24
240.0000 mg | ORAL_CAPSULE | Freq: Every day | ORAL | Status: DC
  Administered 2018-01-22: 240 mg via ORAL
  Filled 2018-01-21: qty 1

## 2018-01-21 MED ORDER — ALBUTEROL SULFATE (2.5 MG/3ML) 0.083% IN NEBU
2.5000 mg | INHALATION_SOLUTION | Freq: Four times a day (QID) | RESPIRATORY_TRACT | Status: DC
  Administered 2018-01-21: 2.5 mg via RESPIRATORY_TRACT
  Filled 2018-01-21: qty 3

## 2018-01-21 MED ORDER — ACETAMINOPHEN 500 MG PO TABS: 500.0000 mg | ORAL_TABLET | Freq: Every day | ORAL | Status: DC | PRN

## 2018-01-21 MED ORDER — FAMOTIDINE 20 MG PO TABS
10.0000 mg | ORAL_TABLET | Freq: Every day | ORAL | Status: DC
  Administered 2018-01-21 – 2018-01-22 (×2): 10 mg via ORAL
  Filled 2018-01-21 (×2): qty 1

## 2018-01-21 MED ORDER — METOPROLOL SUCCINATE ER 50 MG PO TB24
75.0000 mg | ORAL_TABLET | Freq: Every day | ORAL | Status: DC
  Administered 2018-01-22: 75 mg via ORAL
  Filled 2018-01-21: qty 1

## 2018-01-21 MED ORDER — CALCIUM CARBONATE-VITAMIN D 500-200 MG-UNIT PO TABS
1.0000 | ORAL_TABLET | Freq: Every day | ORAL | Status: DC
  Administered 2018-01-21 – 2018-01-22 (×2): 1 via ORAL
  Filled 2018-01-21 (×2): qty 1

## 2018-01-21 MED ORDER — ORAL CARE MOUTH RINSE
15.0000 mL | Freq: Two times a day (BID) | OROMUCOSAL | Status: DC
  Administered 2018-01-22: 15 mL via OROMUCOSAL

## 2018-01-21 NOTE — ED Notes (Signed)
Pt states that she is having lower abd pain, unable to urinate, becomes too sob with exertion for bedside commode, Dr Laverta Baltimore notified, additional orders given,

## 2018-01-21 NOTE — ED Triage Notes (Signed)
Pt c/o mid chest pain with the pain radiating to her left neck; pt was sitting down when the pain started; pt states she feels nauseous; pt has uncontrolled shaking of body

## 2018-01-21 NOTE — ED Notes (Signed)
ED Provider at bedside. 

## 2018-01-21 NOTE — H&P (Signed)
[facility]                                                                                    Patient Demographics  [name] [name], is a 84 y.o. female  CSN: [id number]  MRN: [id number]  DOB - 12/24/1929  Admit Date - 01/21/2018  Outpatient Primary MD for the patient is Caryl Bis, MD   With History of -  Past Medical History:  Diagnosis Date  . Atrial fibrillation ([place])    On Coumadin  . Coronary atherosclerosis of native coronary artery    Nonobstructive at cardiac catheterization 2004  . Dysphagia   . Essential hypertension, benign   . Hyperlipidemia   . Polymyalgia rheumatica ([place])   . Stroke [facility])       Past Surgical History:  Procedure Laterality Date  . APPENDECTOMY    . COLONOSCOPY  2008   Dr.Fleshiman  . Left ankle    . TOE SURGERY    . TOTAL ABDOMINAL HYSTERECTOMY      in for   Chief Complaint  Patient presents with  . Chest Pain     HPI  [name] [name]  is a 84 y.o. female, with past medical history significant for A. fib on Coumadin, this post embolic stroke in 2025 and resulting abnormality of gait, tremors and coronary artery disease presenting with acute chest tightness and pain radiating to the left neck with shortness of breath, nausea and increased shaking.  The patient was evaluated yesterday by neurology with occipital headache-muscle tension headaches likely.  Patient does not remember how long has the chest pain been going on although she thinks it started at 11 AM.  Patient lives alone with her bedridden husband and has help at home, home health.  She reports that the chest discomfort/tightness is still present on and off.    Review of Systems    In addition to the HPI above, No Fever-chills,  No changes with Vision or hearing, No problems swallowing food or Liquids, No Abdominal pain, No Nausea or Vommitting, Bowel movements are regular, No Blood in stool or Urine, No dysuria, No new skin rashes or bruises, No new  joints pains-aches,  No new weakness, tingling, numbness in any extremity, No recent weight gain or loss, No polyuria, polydypsia or polyphagia, No significant Mental Stressors.  A full 10 point Review of Systems was done, except as stated above, all other Review of Systems were negative.   Social History Social History   Tobacco Use  . Smoking status: Never Smoker  . Smokeless tobacco: Never Used  Substance Use Topics  . Alcohol use: No    Alcohol/week: 0.0 oz     Family History Family History  Problem Relation Age of Onset  . Stroke Mother   . Hypertension Sister   . Hypertension Brother   . Rheum arthritis Daughter   . Healthy Son   . Healthy Son   . Healthy Son   . Hypertension Sister   . Heart disease Sister   . Heart disease Brother   . Hypertension Brother   . Arthritis Brother   . Colon cancer Brother   . Hypertension  Brother   . Coronary artery disease Other      Prior to Admission medications   Medication Sig Start Date End Date Taking? Authorizing Provider  aspirin EC 81 MG tablet Take 81 mg by mouth daily.   Yes [provider]  atorvastatin (LIPITOR) 40 MG tablet Take 40 mg by mouth at bedtime.    Yes [provider]  Biotin 1000 MCG tablet Take 1,000 mcg by mouth daily.   Yes [provider]  Calcium Carbonate-Vitamin D (CALTRATE 600+D) 600-400 MG-UNIT per tablet Take 1 tablet by mouth daily.     Yes [provider]  citalopram (CELEXA) 20 MG tablet Take 20 mg by mouth daily.   Yes [provider]  dicyclomine (BENTYL) 20 MG tablet TAKE 1 TABLET BY MOUTH THREE TIMES DAILY FOR BOWEL PROBLEMS 12/30/17  Yes [provider]  diltiazem (CARDIZEM CD) 120 MG 24 hr capsule Take 120 mg by mouth 2 (two) times daily.    Yes [provider]  furosemide (LASIX) 40 MG tablet Take 40 mg by mouth daily.    Yes [provider]  losartan (COZAAR) 50 MG tablet Take 50 mg by mouth every evening.     Yes [provider]  metoprolol succinate (TOPROL-XL) 50 MG 24 hr tablet Take 75 mg by mouth daily. 10/01/17  Yes [provider]  potassium chloride SA (K-DUR,KLOR-CON) 20 MEQ tablet TAKE ONE TABLET BY MOUTH EVERY DAY FOR LOW POTASSIUM 10/01/17  Yes [provider]  predniSONE (DELTASONE) 5 MG tablet Take 10 mg by mouth daily.    Yes [provider]  ranitidine (ZANTAC) 150 MG tablet Take 150 mg by mouth 2 (two) times daily. 10/01/17  Yes [provider]  warfarin (COUMADIN) 4 MG tablet Take 2-4 mg by mouth See admin instructions. Patient takes 4mg  on one evening, then takes 2mg  for two evenings, then then takes 4mg , etc. (4mg , 2mg , 2mg , 4mg , etc)   Yes [provider]  acetaminophen (TYLENOL) 500 MG tablet Take 500-1,000 mg by mouth daily as needed for moderate pain.     [provider]    Allergies  Allergen Reactions  . Sulfonamide Derivatives Rash     Physical Exam  Vitals  Blood pressure (!) 150/99, pulse 61, temperature 98.1 F (36.7 C), temperature source Oral, resp. rate (!) 28, height 5' 6.5" (1.689 m), weight 73.9 kg (163 lb), SpO2 100 %.   1. General in NAD  2. Normal affect and insight, Not Suicidal or Homicidal, Awake Alert, Oriented X 3.  3. No F.N deficits, grossly, patient moving all extremities.  4. Ears and Eyes appear Normal, Conjunctivae clear, PERRLA. Moist Oral Mucosa.  5. Supple Neck, No JVD, No cervical lymphadenopathy appriciated, No Carotid Bruits.  6. Symmetrical Chest wall movement, Good air movement bilaterally, CTAB.  7.  Irregularly irregular, No Gallops, Rubs or Murmurs, No Parasternal Heave.  8. Positive Bowel Sounds, Abdomen Soft, Non tender, No organomegaly appriciated,No rebound -guarding or rigidity.  9.  No Cyanosis, Normal Skin Turgor, No Skin Rash or Bruise.  10. Good muscle tone,  joints appear normal , no effusions, Normal ROM.    Data Review  CBC Recent Labs  Lab  01/21/18 1520  WBC 11.4*  HGB 13.6  HCT 42.1  PLT 328  MCV 92.7  MCH 30.0  MCHC 32.3  RDW 14.6  LYMPHSABS 2.5  MONOABS 1.1*  EOSABS 0.2  BASOSABS 0.0   ------------------------------------------------------------------------------------------------------------------  Chemistries  Recent Labs  Lab  01/21/18 1520  NA 142  K 3.3*  CL 104  CO2 22  GLUCOSE 113*  BUN 15  CREATININE 0.82  CALCIUM 9.3   ------------------------------------------------------------------------------------------------------------------ estimated creatinine clearance is 52.2 mL/min (by C-G formula based on SCr of 0.82 mg/dL). ------------------------------------------------------------------------------------------------------------------ No results for input(s): TSH, T4TOTAL, T3FREE, THYROIDAB in the last 72 hours.  Invalid input(s): FREET3   Coagulation profile Recent Labs  Lab 01/21/18 1543  INR 2.57   ------------------------------------------------------------------------------------------------------------------- No results for input(s): DDIMER in the last 72 hours. -------------------------------------------------------------------------------------------------------------------  Cardiac Enzymes Recent Labs  Lab 01/21/18 1520  TROPONINI <0.03   ------------------------------------------------------------------------------------------------------------------ Invalid input(s): POCBNP   ---------------------------------------------------------------------------------------------------------------  Urinalysis    Component Value Date/Time   COLORURINE STRAW (A) 01/21/2018 1724   APPEARANCEUR CLEAR 01/21/2018 1724   APPEARANCEUR Clear 10/28/2017 1422   LABSPEC 1.005 01/21/2018 1724   PHURINE 7.0 01/21/2018 1724   GLUCOSEU NEGATIVE 01/21/2018 1724   HGBUR NEGATIVE 01/21/2018 1724   BILIRUBINUR NEGATIVE 01/21/2018 1724   BILIRUBINUR Negative 10/28/2017 [place] 01/21/2018 1724   PROTEINUR NEGATIVE 01/21/2018 1724   UROBILINOGEN 0.2 12/23/2011 2034   NITRITE NEGATIVE 01/21/2018 1724   LEUKOCYTESUR NEGATIVE 01/21/2018 1724   LEUKOCYTESUR Negative 10/28/2017 1422    ----------------------------------------------------------------------------------------------------------------     Imaging results:   Dg Chest 2 View  Result Date: 01/21/2018 CLINICAL DATA:  Chest pain EXAM: CHEST - 2 VIEW COMPARISON:  09/25/2017 FINDINGS: The heart is mildly enlarged. Normal pulmonary vascularity. Hyperaeration. Interstitial prominence is stable. Small bilateral pleural effusions. No pneumothorax. Stable thoracic spine. IMPRESSION: Cardiomegaly without decompensation. Small pleural effusions. Electronically Signed   By: Marybelle Killings M.D.   On: 01/21/2018 15:34    My personal review of EKG: A. fib at the rate of 98 bpm with old anteroseptal MI    Assessment & Plan  1.  Chest discomfort      Serial troponins      Check EKG in a.m.       2.A Fib on coumadin 3. Tremors : Chronic, seen by neurology 4.  Old CVA , embolic    DVT Prophylaxis continue with Coumadin  AM Labs Ordered, also please review Full Orders  Family Communication: Admission, patients condition and plan of care including tests being ordered have been discussed with the patient and son who indicate understanding and agree with the plan and Code Status.  Code Status full  Disposition Plan: home  Time spent in minutes : 42 min  Condition GUARDED   @SIGNATURE @

## 2018-01-21 NOTE — ED Notes (Signed)
Pt up to bedside commode, large bowel movement noted, pt states that she is able to move "some better" without being sob,

## 2018-01-21 NOTE — Progress Notes (Signed)
ANTICOAGULATION CONSULT NOTE - Initial Consult  Pharmacy Consult for warfarin Indication: atrial fibrillation  Allergies  Allergen Reactions  . Sulfonamide Derivatives Rash    Patient Measurements: Height: 5' 6.5" (168.9 cm) Weight: 163 lb (73.9 kg) IBW/kg (Calculated) : 60.45   Vital Signs: Temp: 98.1 F (36.7 C) (06/05 1509) Temp Source: Oral (06/05 1509) BP: 139/89 (06/05 2000) Pulse Rate: 74 (06/05 2000)  Labs: Recent Labs    01/21/18 1520 01/21/18 1543  HGB 13.6  --   HCT 42.1  --   PLT 328  --   LABPROT  --  27.4*  INR  --  2.57  CREATININE 0.82  --   TROPONINI <0.03  --     Estimated Creatinine Clearance: 52.2 mL/min (by C-G formula based on SCr of 0.82 mg/dL).   Medical History: Past Medical History:  Diagnosis Date  . Atrial fibrillation ([place])    On Coumadin  . Coronary atherosclerosis of native coronary artery    Nonobstructive at cardiac catheterization 2004  . Dysphagia   . Essential hypertension, benign   . Hyperlipidemia   . Polymyalgia rheumatica ([place])   . Stroke [facility])     Medications:  Medications Prior to Admission  Medication Sig Dispense Refill Last Dose  . aspirin EC 81 MG tablet Take 81 mg by mouth daily.   01/20/2018 at Unknown time  . atorvastatin (LIPITOR) 40 MG tablet Take 40 mg by mouth at bedtime.    01/20/2018 at Unknown time  . Biotin 1000 MCG tablet Take 1,000 mcg by mouth daily.   01/20/2018 at Unknown time  . Calcium Carbonate-Vitamin D (CALTRATE 600+D) 600-400 MG-UNIT per tablet Take 1 tablet by mouth daily.     01/20/2018 at Unknown time  . citalopram (CELEXA) 20 MG tablet Take 20 mg by mouth daily.   01/20/2018 at Unknown time  . dicyclomine (BENTYL) 20 MG tablet TAKE 1 TABLET BY MOUTH THREE TIMES DAILY FOR BOWEL PROBLEMS  1 01/20/2018 at Unknown time  . diltiazem (CARDIZEM CD) 120 MG 24 hr capsule Take 120 mg by mouth 2 (two) times daily.    01/20/2018 at Unknown time  . furosemide (LASIX) 40 MG tablet Take 40 mg by mouth daily.     01/20/2018 at Unknown time  . losartan (COZAAR) 50 MG tablet Take 50 mg by mouth every evening.    01/20/2018 at Unknown time  . metoprolol succinate (TOPROL-XL) 50 MG 24 hr tablet Take 75 mg by mouth daily.  3 01/20/2018 at Unknown time  . potassium chloride SA (K-DUR,KLOR-CON) 20 MEQ tablet TAKE ONE TABLET BY MOUTH EVERY DAY FOR LOW POTASSIUM  6 01/20/2018 at Unknown time  . predniSONE (DELTASONE) 5 MG tablet Take 10 mg by mouth daily.    01/20/2018 at Unknown time  . ranitidine (ZANTAC) 150 MG tablet Take 150 mg by mouth 2 (two) times daily.  1 01/20/2018 at Unknown time  . warfarin (COUMADIN) 4 MG tablet Take 2-4 mg by mouth See admin instructions. Patient takes 4mg  on one evening, then takes 2mg  for two evenings, then then takes 4mg , etc. (4mg , 2mg , 2mg , 4mg , etc)   01/20/2018 at 2000  . acetaminophen (TYLENOL) 500 MG tablet Take 500-1,000 mg by mouth daily as needed for moderate pain.    unknown    Assessment: Pharmacy consulted to dose warfarin for patient with atrial fibrillation. INR is therapeutic on admission   Goal of Therapy:  INR 2-3 Monitor platelets by anticoagulation protocol: Yes   Plan:  Warfarin  2 mg x 1 dose Monitor daily labs/INR and s/s of bleeding  Ramond Craver 01/21/2018,9:18 PM

## 2018-01-21 NOTE — ED Provider Notes (Signed)
Emergency Department Provider Note   I have reviewed the triage vital signs and the nursing notes.   HISTORY  Chief Complaint Chest Pain   HPI [name] [name] is a 84 y.o. female with PMH of A-fib, CAD, HTN, HLD, Polymyalgia rheumatica, and CVA resents to the emergency department for evaluation of acute chest tightness/pain radiating to the left neck, shortness of breath, generalized weakness, nausea, and uncontrolled body shaking.  The patient and family at bedside state that the shaking symptoms are chronic.  She is following with neurology who do not have a specific diagnosis as of yet.  Her chest tightness, shortness of breath, nausea, weakness are new symptoms.  She has had intermittent episodes since 11 AM today.  No exertional or pleuritic component to pain.  No fevers or chills.  No new medications.  Patient has been compliant with her home medications.   Past Medical History:  Diagnosis Date  . Atrial fibrillation ([place])    On Coumadin  . Coronary atherosclerosis of native coronary artery    Nonobstructive at cardiac catheterization 2004  . Dysphagia   . Essential hypertension, benign   . Hyperlipidemia   . Polymyalgia rheumatica ([place])   . Stroke [facility])     Patient Active Problem List   Diagnosis Date Noted  . Chest pain 01/21/2018  . Hypokalemia   . Hypoxia   . SOB (shortness of breath)   . Acute bronchitis   . Atypical chest pain 09/25/2015  . Acute diastolic CHF (congestive heart failure) ([place]) 12/29/2014  . Acute diastolic congestive heart failure ([place]) 12/29/2014  . Dyspnea   . Chronic diarrhea 07/04/2014  . Coronary atherosclerosis of native coronary artery 03/28/2014  . Arnaldo Heffron term (current) use of anticoagulants 11/26/2010  . HYPERLIPIDEMIA 05/11/2007  . Essential hypertension, benign 05/11/2007  . Chronic atrial fibrillation ([place]) 05/11/2007    Past Surgical History:  Procedure Laterality Date  . APPENDECTOMY    . COLONOSCOPY  2008   Dr.Fleshiman  .  Left ankle    . TOE SURGERY    . TOTAL ABDOMINAL HYSTERECTOMY      Current Outpatient Rx  . Order #: [id number] Class: Historical Med  . Order #: [id number] Class: Historical Med  . Order #: [id number] Class: Historical Med  . Order #: [id number] Class: Historical Med  . Order #: [id number] Class: Historical Med  . Order #: [id number] Class: Historical Med  . Order #: [id number] Class: Historical Med  . Order #: [id number] Class: Historical Med  . Order #: [id number] Class: Historical Med  . Order #: [id number] Class: Historical Med  . Order #: [id number] Class: Historical Med  . Order #: [id number] Class: Historical Med  . Order #: [id number] Class: Historical Med  . Order #: [id number] Class: Historical Med  . Order #: [id number] Class: Historical Med    Allergies Sulfonamide derivatives  Family History  Problem Relation Age of Onset  . Stroke Mother   . Hypertension Sister   . Hypertension Brother   . Rheum arthritis Daughter   . Healthy Son   . Healthy Son   . Healthy Son   . Hypertension Sister   . Heart disease Sister   . Heart disease Brother   . Hypertension Brother   . Arthritis Brother   . Colon cancer Brother   . Hypertension Brother   . Coronary artery disease Other     Social History Social History   Tobacco Use  . Smoking status: Never Smoker  . Smokeless tobacco: Never Used  Substance Use Topics  . Alcohol use: No  Alcohol/week: 0.0 oz  . Drug use: No    Review of Systems  Constitutional: No fever/chills Eyes: No visual changes. ENT: No sore throat. Cardiovascular: Positivechest pain. Respiratory: Positive shortness of breath. Gastrointestinal: No abdominal pain. Positive nausea, no vomiting.  No diarrhea.  No constipation. Genitourinary: Negative for dysuria. Musculoskeletal: Negative for back pain. Skin: Negative for rash. Neurological: Negative for headaches, focal weakness or numbness. Positive uncontrolled shaking.   10-point ROS otherwise  negative.  ____________________________________________   PHYSICAL EXAM:  VITAL SIGNS: ED Triage Vitals [01/21/18 1509]  Enc Vitals Group     BP (!) 155/112     Pulse Rate 88     Resp (!) 28     Temp 98.1 F (36.7 C)     Temp Source Oral     SpO2 100 %     Weight 163 lb (73.9 kg)     Height 5' 6.5" (1.689 m)   Constitutional: Alert and oriented. Well appearing and in no acute distress. Eyes: Conjunctivae are normal.  Head: Atraumatic. Nose: No congestion/rhinnorhea. Mouth/Throat: Mucous membranes are moist. Neck: No stridor.   Cardiovascular: Normal rate, regular rhythm. Good peripheral circulation. Grossly normal heart sounds.   Respiratory: Increased respiratory effort.  No retractions. Lungs with crackles at the bases. No wheezing.  Gastrointestinal: Soft and nontender. No distention.  Musculoskeletal: No lower extremity tenderness with 2+ pitting edema. No gross deformities of extremities. Neurologic:  Normal speech and language. No gross focal neurologic deficits are appreciated.  Skin:  Skin is warm, dry and intact. No rash noted.  ____________________________________________   LABS (all labs ordered are listed, but only abnormal results are displayed)  Labs Reviewed  BASIC METABOLIC PANEL - Abnormal; Notable for the following components:      Result Value   Potassium 3.3 (*)    Glucose, Bld 113 (*)    Anion gap 16 (*)    All other components within normal limits  CBC WITH DIFFERENTIAL/PLATELET - Abnormal; Notable for the following components:   WBC 11.4 (*)    Monocytes Absolute 1.1 (*)    All other components within normal limits  PROTIME-INR - Abnormal; Notable for the following components:   Prothrombin Time 27.4 (*)    All other components within normal limits  BRAIN NATRIURETIC PEPTIDE - Abnormal; Notable for the following components:   B Natriuretic Peptide 440.0 (*)    All other components within normal limits  URINALYSIS, ROUTINE W REFLEX  MICROSCOPIC - Abnormal; Notable for the following components:   Color, Urine STRAW (*)    All other components within normal limits  TROPONIN I   ____________________________________________  EKG   EKG Interpretation  Date/Time:  Wednesday January 21 2018 15:11:41 EDT Ventricular Rate:  98 PR Interval:    QRS Duration: 102 QT Interval:  390 QTC Calculation: 497 R Axis:   -23 Text Interpretation:  Atrial fibrillation Inferior infarct , age undetermined Anteroseptal infarct , age undetermined Abnormal ECG No STEMI.  Confirmed by Nanda Quinton [phone]) on 01/21/2018 3:41:53 PM       ____________________________________________  RADIOLOGY  Dg Chest 2 View  Result Date: 01/21/2018 CLINICAL DATA:  Chest pain EXAM: CHEST - 2 VIEW COMPARISON:  09/25/2017 FINDINGS: The heart is mildly enlarged. Normal pulmonary vascularity. Hyperaeration. Interstitial prominence is stable. Small bilateral pleural effusions. No pneumothorax. Stable thoracic spine. IMPRESSION: Cardiomegaly without decompensation. Small pleural effusions. Electronically Signed   By: Marybelle Killings M.D.   On: 01/21/2018 15:34    ____________________________________________  PROCEDURES  Procedure(s) performed:   Procedures  None  ____________________________________________   INITIAL IMPRESSION / ASSESSMENT AND PLAN / ED COURSE  Pertinent labs & imaging results that were available during my care of the patient were reviewed by me and considered in my medical decision making (see chart for details).  Emergency department for evaluation of chest tightness radiating to the left neck with nausea and generalized weakness.  She also had an episode of uncontrollable body shaking but denies any infection symptoms.  She is followed by neurology for these shaking episodes which are chronic and not significantly worsening today.  Concerned regarding the patient's history of chest pressure with dyspnea.  Very low suspicion for PE or  vascular etiology of pain.  ACS is elevated on my differential.  EKG reviewed which seems similar to prior.  Patient clinically has increased edema in the lower extremities.  Plan for Lasix IV.  Chest x-ray reviewed with cardiomegaly but no overt pulmonary edema.   No ASA given due to patient's Warfarin use. INR is therapeutic so doubt PE. Labs and CXR reviewed with no acute findings. Plan for admission for enzyme cycling and diuresis which I have started here.   Discussed patient's case with Hospitalist to request admission. Patient and family (if present) updated with plan. Care transferred to Hospitalist service.  I reviewed all nursing notes, vitals, pertinent old records, EKGs, labs, imaging (as available).  ____________________________________________  FINAL CLINICAL IMPRESSION(S) / ED DIAGNOSES  Final diagnoses:  Precordial chest pain  Shortness of breath  Episode of shaking    MEDICATIONS GIVEN DURING THIS VISIT:  Medications  furosemide (LASIX) injection 40 mg (40 mg Intravenous Given 01/21/18 1631)    Note:  This document was prepared using Dragon voice recognition software and may include unintentional dictation errors.  Nanda Quinton, MD Emergency Medicine    Ivyanna Sibert, [facility], MD 01/21/18 [phone]

## 2018-01-21 NOTE — ED Notes (Signed)
Pt given dinner tray.

## 2018-01-22 DIAGNOSIS — R079 Chest pain, unspecified: Secondary | ICD-10-CM

## 2018-01-22 DIAGNOSIS — R072 Precordial pain: Secondary | ICD-10-CM | POA: Diagnosis not present

## 2018-01-22 LAB — BASIC METABOLIC PANEL
ANION GAP: 8 (ref 5–15)
BUN: 13 mg/dL (ref 6–20)
CALCIUM: 8.9 mg/dL (ref 8.9–10.3)
CO2: 29 mmol/L (ref 22–32)
Chloride: 104 mmol/L (ref 101–111)
Creatinine, Ser: 0.85 mg/dL (ref 0.44–1.00)
GFR calc non Af Amer: >60 mL/min (ref >60)
Glucose, Bld: 113 mg/dL — ABNORMAL HIGH (ref 65–99)
Potassium: 4.2 mmol/L (ref 3.5–5.1)
SODIUM: 141 mmol/L (ref 135–145)

## 2018-01-22 LAB — MAGNESIUM: Magnesium: 2 mg/dL (ref 1.7–2.4)

## 2018-01-22 LAB — PROTIME-INR
INR: 2.11
PROTHROMBIN TIME: 23.4 s — AB (ref 11.4–15.2)

## 2018-01-22 LAB — TROPONIN I: Troponin I: <0.03 ng/mL (ref <0.03)

## 2018-01-22 MED ORDER — WARFARIN SODIUM 2 MG PO TABS: 2.0000 mg | ORAL_TABLET | Freq: Once | ORAL | Status: DC

## 2018-01-22 MED ORDER — POTASSIUM CHLORIDE CRYS ER 20 MEQ PO TBCR
40.0000 meq | EXTENDED_RELEASE_TABLET | Freq: Once | ORAL | Status: AC
  Administered 2018-01-22: 40 meq via ORAL
  Filled 2018-01-22: qty 2

## 2018-01-22 MED ORDER — ATENOLOL 50 MG PO TABS: 50.0000 mg | ORAL_TABLET | Freq: Every day | ORAL | 0 refills | Status: DC

## 2018-01-22 NOTE — Progress Notes (Signed)
SATURATION QUALIFICATIONS: (This note is used to comply with regulatory documentation for home oxygen)  Patient Saturations on Room Air at Rest 98%  Patient Saturations on Room Air while Ambulating = 100%  Patient Saturations on 0 Liters of oxygen while Ambulating = 100%  Please briefly explain why patient needs home oxygen: 

## 2018-01-22 NOTE — Progress Notes (Signed)
ANTICOAGULATION CONSULT NOTE - Initial Consult  Pharmacy Consult for warfarin Indication: atrial fibrillation   Patient Measurements: Height: 5' 6.5" (168.9 cm) Weight: 158 lb 15.2 oz (72.1 kg) IBW/kg (Calculated) : 60.45   Vital Signs: Temp: 98.3 F (36.8 C) (06/06 0851) Temp Source: Oral (06/06 0851) BP: 137/81 (06/06 0851) Pulse Rate: 72 (06/06 0851)  Labs: Recent Labs    01/21/18 1520 01/21/18 1543 01/21/18 2107 01/22/18 0310 01/22/18 0903  HGB 13.6  --   --   --   --   HCT 42.1  --   --   --   --   PLT 328  --   --   --   --   LABPROT  --  27.4*  --  23.4*  --   INR  --  2.57  --  2.11  --   CREATININE 0.82  --   --   --  0.85  TROPONINI <0.03  --  <0.03 <0.03 <0.03    Estimated Creatinine Clearance: 46.2 mL/min (by C-G formula based on SCr of 0.85 mg/dL).    Assessment: Pharmacy consulted to dose warfarin for patient with atrial fibrillation. INR is therapeutic on admission   Goal of Therapy:  INR 2-3 Monitor platelets by anticoagulation protocol: Yes   Plan: INR 2.11 today Give warfarin 2 mg x 1 dose today. Monitor daily labs/INR and s/s of bleeding  Despina Pole 01/22/2018,11:47 AM

## 2018-01-22 NOTE — Discharge Summary (Addendum)
Physician Discharge Summary  [name] [name] RCV:[id number] DOB: August 21, 1929 DOA: 01/21/2018  PCP: Caryl Bis, MD  Admit date: 01/21/2018  Discharge date: 01/22/2018  Admitted From:Home  Disposition:  Home  Recommendations for Outpatient Follow-up:  1. Follow up with Cardiology as scheduled on 6/27 2. Follow up with PCP in 1 month  Home Health:N/A  Equipment/Devices:N/A  Discharge Condition:Stable  CODE STATUS: Full  Diet recommendation: Heart Healthy  Brief/Interim Summary:  [name] [name]  is a 84 y.o. female, with past medical history significant for A. 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  The patient was evaluated yesterday by neurology with occipital headache-muscle tension headaches likely.  She was monitored for ACS with serial troponins and EKG demonstrating no significant findings.  Her troponin has remained less than 0.03 on 3 occasions and repeat EKG this morning demonstrates some atrial fibrillation at 80 bpm with no changes.  Her symptoms have now resolved and she is ready for discharge.  She will follow-up with her cardiologist Dr. Domenic Polite on 6/27 for further evaluation and work-up.   Discharge Diagnoses:  Active Problems:   Chest pain  1. Atypical chest pain.  Now resolved.  No sign of ACS.  Will follow up with cardiology as scheduled. 2. Atrial fibrillation.  Continue Coumadin which is currently therapeutic.  Rate control with beta-blocker and Cardizem as ordered. 3. Chronic tremors.  Followed by neurology.  Discharge Instructions  Discharge Instructions    Diet - low sodium heart healthy   Complete by:  As directed    Increase activity slowly   Complete by:  As directed      Allergies as of 01/22/2018      Reactions   Sulfonamide Derivatives Rash       Medication List    TAKE these medications   acetaminophen 500 MG tablet Commonly known as:  TYLENOL Take 500-1,000 mg by mouth daily as needed for moderate pain.   aspirin EC 81 MG tablet Take 81 mg by mouth daily.   atenolol 50 MG tablet Commonly known as:  TENORMIN Take 1 tablet (50 mg total) by mouth at bedtime.   atorvastatin 40 MG tablet Commonly known as:  LIPITOR Take 40 mg by mouth at bedtime.   Biotin 1000 MCG tablet Take 1,000 mcg by mouth daily.   CALTRATE 600+D 600-400 MG-UNIT tablet Generic drug:  Calcium Carbonate-Vitamin D Take 1 tablet by mouth daily.   citalopram 20 MG tablet Commonly known as:  CELEXA Take 20 mg by mouth daily.   dicyclomine 20 MG tablet Commonly known as:  BENTYL TAKE 1 TABLET BY MOUTH THREE TIMES DAILY FOR BOWEL PROBLEMS   diltiazem 120 MG 24 hr capsule Commonly known as:  CARDIZEM CD Take 120 mg by mouth 2 (two) times daily.   furosemide 40 MG tablet Commonly known as:  LASIX Take 40 mg by mouth daily.   losartan 50 MG tablet Commonly known as:  COZAAR Take 50 mg by mouth every evening.   metoprolol succinate 50 MG 24 hr tablet Commonly known as:  TOPROL-XL Take 75 mg by mouth daily.   potassium chloride SA 20 MEQ tablet Commonly known as:  K-DUR,KLOR-CON TAKE ONE TABLET BY MOUTH EVERY DAY FOR LOW POTASSIUM   predniSONE 5 MG tablet Commonly known as:  DELTASONE Take 10 mg  by mouth daily.   ranitidine 150 MG tablet Commonly known as:  ZANTAC Take 150 mg by mouth 2 (two) times daily.   warfarin 4 MG tablet Commonly known as:  COUMADIN Take 2-4 mg by mouth See admin instructions. Patient takes 4mg  on one evening, then takes 2mg  for two evenings, then then takes 4mg , etc. (4mg , 2mg , 2mg , 4mg , etc)      Follow-up Information    Erma Heritage, PA-C Follow up on 02/12/2018.   Specialties:  Physician Assistant, Cardiology Why:  Cardiology Follow-Up on 02/12/2018 at 3:30 PM with Bernerd Pho, PA-C  (works with Dr. Domenic Polite). Contact information: [address] [place] [address] [phone]        Caryl Bis, MD Follow up in 1 month(s).   Specialty:  Family Medicine Contact information: [address] [place] [address] [phone]          Allergies  Allergen Reactions  . Sulfonamide Derivatives Rash    Consultations:  None   Procedures/Studies: Dg Chest 2 View  Result Date: 01/21/2018 CLINICAL DATA:  Chest pain EXAM: CHEST - 2 VIEW COMPARISON:  09/25/2017 FINDINGS: The heart is mildly enlarged. Normal pulmonary vascularity. Hyperaeration. Interstitial prominence is stable. Small bilateral pleural effusions. No pneumothorax. Stable thoracic spine. IMPRESSION: Cardiomegaly without decompensation. Small pleural effusions. Electronically Signed   By: Marybelle Killings M.D.   On: 01/21/2018 15:34   Discharge Exam: Vitals:   01/22/18 1055 01/22/18 1059  BP:    Pulse:    Resp:    Temp:    SpO2: 100% 97%   Vitals:   01/22/18 0851 01/22/18 1050 01/22/18 1055 01/22/18 1059  BP: 137/81     Pulse: 72     Resp: 19     Temp: 98.3 F (36.8 C)     TempSrc: Oral     SpO2: 100% 98% 100% 97%  Weight:      Height:        General: Pt is alert, awake, not in acute distress Cardiovascular: Irregular rate/rhythm, S1/S2 +, no rubs, no gallops Respiratory: CTA bilaterally, no wheezing, no rhonchi Abdominal: Soft, NT, ND, bowel sounds + Extremities: no edema, no cyanosis    The results of significant diagnostics from this hospitalization (including imaging, microbiology, ancillary and laboratory) are listed below for reference.     Microbiology: No results found for this or any previous visit (from the past 240 hour(s)).   Labs: BNP (last 3 results) Recent Labs    01/21/18 1554  BNP 338.2*   Basic Metabolic Panel: Recent Labs  Lab 01/21/18 1520 01/22/18 0903  NA 142 141  K 3.3* 4.2  CL 104 104  CO2 22 29  GLUCOSE 113* 113*  BUN 15 13  CREATININE  0.82 0.85  CALCIUM 9.3 8.9  MG  --  2.0   Liver Function Tests: No results for input(s): AST, ALT, ALKPHOS, BILITOT, PROT, ALBUMIN in the last 168 hours. No results for input(s): LIPASE, AMYLASE in the last 168 hours. No results for input(s): AMMONIA in the last 168 hours. CBC: Recent Labs  Lab 01/21/18 1520  WBC 11.4*  NEUTROABS 7.5  HGB 13.6  HCT 42.1  MCV 92.7  PLT 328   Cardiac Enzymes: Recent Labs  Lab 01/21/18 1520 01/21/18 2107 01/22/18 0310 01/22/18 0903  TROPONINI <0.03 <0.03 <0.03 <0.03   BNP: Invalid input(s): POCBNP CBG: No results for input(s): GLUCAP in the last 168 hours. D-Dimer No results for input(s): DDIMER in the last  72 hours. Hgb A1c No results for input(s): HGBA1C in the last 72 hours. Lipid Profile No results for input(s): CHOL, HDL, LDLCALC, TRIG, CHOLHDL, LDLDIRECT in the last 72 hours. Thyroid function studies No results for input(s): TSH, T4TOTAL, T3FREE, THYROIDAB in the last 72 hours.  Invalid input(s): FREET3 Anemia work up No results for input(s): VITAMINB12, FOLATE, FERRITIN, TIBC, IRON, RETICCTPCT in the last 72 hours. Urinalysis    Component Value Date/Time   COLORURINE STRAW (A) 01/21/2018 1724   APPEARANCEUR CLEAR 01/21/2018 1724   APPEARANCEUR Clear 10/28/2017 1422   LABSPEC 1.005 01/21/2018 1724   PHURINE 7.0 01/21/2018 1724   GLUCOSEU NEGATIVE 01/21/2018 1724   HGBUR NEGATIVE 01/21/2018 1724   BILIRUBINUR NEGATIVE 01/21/2018 1724   BILIRUBINUR Negative 10/28/2017 1422   KETONESUR NEGATIVE 01/21/2018 1724   PROTEINUR NEGATIVE 01/21/2018 1724   UROBILINOGEN 0.2 12/23/2011 2034   NITRITE NEGATIVE 01/21/2018 1724   LEUKOCYTESUR NEGATIVE 01/21/2018 1724   LEUKOCYTESUR Negative 10/28/2017 1422   Sepsis Labs Invalid input(s): PROCALCITONIN,  WBC,  LACTICIDVEN Microbiology No results found for this or any previous visit (from the past 240 hour(s)).   Time coordinating discharge: 35 minutes  SIGNED:   Rodena Goldmann, DO [facility] 01/22/2018, 1:17 PM Pager [phone]  If 7PM-7AM, please contact night-coverage [email or web address] Password [email or web address]

## 2018-02-10 DIAGNOSIS — I482 Chronic atrial fibrillation: Secondary | ICD-10-CM | POA: Diagnosis not present

## 2018-02-10 DIAGNOSIS — D519 Vitamin B12 deficiency anemia, unspecified: Secondary | ICD-10-CM | POA: Diagnosis not present

## 2018-02-12 ENCOUNTER — Ambulatory Visit: Payer: Medicare Other | Admitting: Student

## 2018-02-16 DIAGNOSIS — Z6826 Body mass index (BMI) 26.0-26.9, adult: Secondary | ICD-10-CM | POA: Diagnosis not present

## 2018-02-16 DIAGNOSIS — R300 Dysuria: Secondary | ICD-10-CM | POA: Diagnosis not present

## 2018-02-16 DIAGNOSIS — N3001 Acute cystitis with hematuria: Secondary | ICD-10-CM | POA: Diagnosis not present

## 2018-02-26 DIAGNOSIS — I482 Chronic atrial fibrillation: Secondary | ICD-10-CM | POA: Diagnosis not present

## 2018-03-12 DIAGNOSIS — E782 Mixed hyperlipidemia: Secondary | ICD-10-CM | POA: Diagnosis not present

## 2018-03-12 DIAGNOSIS — I10 Essential (primary) hypertension: Secondary | ICD-10-CM | POA: Diagnosis not present

## 2018-03-12 DIAGNOSIS — I482 Chronic atrial fibrillation: Secondary | ICD-10-CM | POA: Diagnosis not present

## 2018-03-12 DIAGNOSIS — R5382 Chronic fatigue, unspecified: Secondary | ICD-10-CM | POA: Diagnosis not present

## 2018-03-17 DIAGNOSIS — I482 Chronic atrial fibrillation: Secondary | ICD-10-CM | POA: Diagnosis not present

## 2018-03-17 DIAGNOSIS — I10 Essential (primary) hypertension: Secondary | ICD-10-CM | POA: Diagnosis not present

## 2018-03-17 DIAGNOSIS — Z6825 Body mass index (BMI) 25.0-25.9, adult: Secondary | ICD-10-CM | POA: Diagnosis not present

## 2018-03-17 DIAGNOSIS — N183 Chronic kidney disease, stage 3 (moderate): Secondary | ICD-10-CM | POA: Diagnosis not present

## 2018-03-17 DIAGNOSIS — R7301 Impaired fasting glucose: Secondary | ICD-10-CM | POA: Diagnosis not present

## 2018-03-17 DIAGNOSIS — E8881 Metabolic syndrome: Secondary | ICD-10-CM | POA: Diagnosis not present

## 2018-03-17 DIAGNOSIS — E782 Mixed hyperlipidemia: Secondary | ICD-10-CM | POA: Diagnosis not present

## 2018-03-17 DIAGNOSIS — L03031 Cellulitis of right toe: Secondary | ICD-10-CM | POA: Diagnosis not present

## 2018-03-20 ENCOUNTER — Other Ambulatory Visit (HOSPITAL_COMMUNITY)
Admission: RE | Admit: 2018-03-20 | Discharge: 2018-03-20 | Disposition: A | Discharge status: Home / Self Care | Payer: Medicare Other | Source: Ambulatory Visit | Attending: Podiatry | Admitting: Podiatry

## 2018-03-20 DIAGNOSIS — Z0189 Encounter for other specified special examinations: Secondary | ICD-10-CM | POA: Diagnosis not present

## 2018-03-20 DIAGNOSIS — L02611 Cutaneous abscess of right foot: Secondary | ICD-10-CM | POA: Diagnosis not present

## 2018-03-20 DIAGNOSIS — L03031 Cellulitis of right toe: Secondary | ICD-10-CM | POA: Diagnosis not present

## 2018-03-20 LAB — COMPREHENSIVE METABOLIC PANEL
ALT: 13 U/L (ref 0–44)
ANION GAP: 12 (ref 5–15)
AST: 23 U/L (ref 15–41)
Albumin: 3.3 g/dL — ABNORMAL LOW (ref 3.5–5.0)
Alkaline Phosphatase: 64 U/L (ref 38–126)
BUN: 12 mg/dL (ref 8–23)
CHLORIDE: 104 mmol/L (ref 98–111)
CO2: 19 mmol/L — AB (ref 22–32)
Calcium: 8.5 mg/dL — ABNORMAL LOW (ref 8.9–10.3)
Creatinine, Ser: 1.18 mg/dL — ABNORMAL HIGH (ref 0.44–1.00)
GFR calc non Af Amer: 41 mL/min — ABNORMAL LOW (ref >60)
GFR, EST AFRICAN AMERICAN: 47 mL/min — AB (ref >60)
Glucose, Bld: 157 mg/dL — ABNORMAL HIGH (ref 70–99)
Potassium: 3.2 mmol/L — ABNORMAL LOW (ref 3.5–5.1)
SODIUM: 135 mmol/L (ref 135–145)
Total Bilirubin: 0.8 mg/dL (ref 0.3–1.2)
Total Protein: 6.7 g/dL (ref 6.5–8.1)

## 2018-03-20 LAB — CBC WITH DIFFERENTIAL/PLATELET
Basophils Absolute: 0 10*3/uL (ref 0.0–0.1)
Basophils Relative: 0 %
Eosinophils Absolute: 0 10*3/uL (ref 0.0–0.7)
Eosinophils Relative: 0 %
HEMATOCRIT: 35.9 % — AB (ref 36.0–46.0)
HEMOGLOBIN: 11.9 g/dL — AB (ref 12.0–15.0)
LYMPHS ABS: 0.9 10*3/uL (ref 0.7–4.0)
Lymphocytes Relative: 8 %
MCH: 30.1 pg (ref 26.0–34.0)
MCHC: 33.1 g/dL (ref 30.0–36.0)
MCV: 90.9 fL (ref 78.0–100.0)
MONOS PCT: 5 %
Monocytes Absolute: 0.5 10*3/uL (ref 0.1–1.0)
NEUTROS PCT: 87 %
Neutro Abs: 9.1 10*3/uL — ABNORMAL HIGH (ref 1.7–7.7)
Platelets: 284 10*3/uL (ref 150–400)
RBC: 3.95 MIL/uL (ref 3.87–5.11)
RDW: 13.6 % (ref 11.5–15.5)
WBC: 10.5 10*3/uL (ref 4.0–10.5)

## 2018-03-20 LAB — C-REACTIVE PROTEIN: CRP: 10.6 mg/dL — ABNORMAL HIGH (ref <1.0)

## 2018-03-20 LAB — SEDIMENTATION RATE: Sed Rate: 57 mm/hr — ABNORMAL HIGH (ref 0–22)

## 2018-03-21 ENCOUNTER — Observation Stay (HOSPITAL_COMMUNITY)
Admission: EM | Admit: 2018-03-21 | Discharge: 2018-03-23 | Disposition: A | Discharge status: Home / Self Care | Payer: Medicare Other | Attending: Family Medicine | Admitting: Family Medicine

## 2018-03-21 ENCOUNTER — Encounter (HOSPITAL_COMMUNITY): Payer: Self-pay | Admitting: Emergency Medicine

## 2018-03-21 ENCOUNTER — Emergency Department (HOSPITAL_COMMUNITY): Payer: Medicare Other

## 2018-03-21 ENCOUNTER — Other Ambulatory Visit: Payer: Self-pay

## 2018-03-21 DIAGNOSIS — I10 Essential (primary) hypertension: Secondary | ICD-10-CM | POA: Diagnosis not present

## 2018-03-21 DIAGNOSIS — F32A Depression, unspecified: Secondary | ICD-10-CM | POA: Diagnosis present

## 2018-03-21 DIAGNOSIS — M353 Polymyalgia rheumatica: Secondary | ICD-10-CM | POA: Diagnosis present

## 2018-03-21 DIAGNOSIS — Z7982 Long term (current) use of aspirin: Secondary | ICD-10-CM | POA: Insufficient documentation

## 2018-03-21 DIAGNOSIS — R0600 Dyspnea, unspecified: Secondary | ICD-10-CM | POA: Diagnosis not present

## 2018-03-21 DIAGNOSIS — R0609 Other forms of dyspnea: Secondary | ICD-10-CM

## 2018-03-21 DIAGNOSIS — I5031 Acute diastolic (congestive) heart failure: Secondary | ICD-10-CM | POA: Diagnosis not present

## 2018-03-21 DIAGNOSIS — I2510 Atherosclerotic heart disease of native coronary artery without angina pectoris: Secondary | ICD-10-CM | POA: Diagnosis present

## 2018-03-21 DIAGNOSIS — Z7901 Long term (current) use of anticoagulants: Secondary | ICD-10-CM | POA: Insufficient documentation

## 2018-03-21 DIAGNOSIS — F329 Major depressive disorder, single episode, unspecified: Secondary | ICD-10-CM | POA: Insufficient documentation

## 2018-03-21 DIAGNOSIS — Z79899 Other long term (current) drug therapy: Secondary | ICD-10-CM | POA: Insufficient documentation

## 2018-03-21 DIAGNOSIS — I480 Paroxysmal atrial fibrillation: Principal | ICD-10-CM | POA: Insufficient documentation

## 2018-03-21 DIAGNOSIS — J811 Chronic pulmonary edema: Secondary | ICD-10-CM | POA: Diagnosis not present

## 2018-03-21 DIAGNOSIS — E785 Hyperlipidemia, unspecified: Secondary | ICD-10-CM | POA: Diagnosis not present

## 2018-03-21 DIAGNOSIS — I4891 Unspecified atrial fibrillation: Secondary | ICD-10-CM | POA: Diagnosis present

## 2018-03-21 DIAGNOSIS — R0602 Shortness of breath: Secondary | ICD-10-CM | POA: Diagnosis not present

## 2018-03-21 HISTORY — DX: Major depressive disorder, single episode, unspecified: F32.9

## 2018-03-21 HISTORY — DX: Depression, unspecified: F32.A

## 2018-03-21 LAB — COMPREHENSIVE METABOLIC PANEL
ALBUMIN: 3.5 g/dL (ref 3.5–5.0)
ALT: 15 U/L (ref 0–44)
ANION GAP: 11 (ref 5–15)
AST: 23 U/L (ref 15–41)
Alkaline Phosphatase: 64 U/L (ref 38–126)
BUN: 17 mg/dL (ref 8–23)
CO2: 21 mmol/L — AB (ref 22–32)
Calcium: 9 mg/dL (ref 8.9–10.3)
Chloride: 105 mmol/L (ref 98–111)
Creatinine, Ser: 0.93 mg/dL (ref 0.44–1.00)
GFR calc non Af Amer: 54 mL/min — ABNORMAL LOW (ref >60)
GLUCOSE: 114 mg/dL — AB (ref 70–99)
POTASSIUM: 3.7 mmol/L (ref 3.5–5.1)
SODIUM: 137 mmol/L (ref 135–145)
Total Bilirubin: 0.7 mg/dL (ref 0.3–1.2)
Total Protein: 7 g/dL (ref 6.5–8.1)

## 2018-03-21 LAB — CBC
HEMATOCRIT: 35.3 % — AB (ref 36.0–46.0)
Hemoglobin: 11.9 g/dL — ABNORMAL LOW (ref 12.0–15.0)
MCH: 30.4 pg (ref 26.0–34.0)
MCHC: 33.7 g/dL (ref 30.0–36.0)
MCV: 90.1 fL (ref 78.0–100.0)
Platelets: 314 10*3/uL (ref 150–400)
RBC: 3.92 MIL/uL (ref 3.87–5.11)
RDW: 13.8 % (ref 11.5–15.5)
WBC: 7.7 10*3/uL (ref 4.0–10.5)

## 2018-03-21 LAB — MAGNESIUM: Magnesium: 1.8 mg/dL (ref 1.7–2.4)

## 2018-03-21 LAB — PROTIME-INR
INR: 1.02
Prothrombin Time: 13.4 seconds (ref 11.4–15.2)

## 2018-03-21 LAB — TROPONIN I: Troponin I: <0.03 ng/mL (ref <0.03)

## 2018-03-21 LAB — PHOSPHORUS: Phosphorus: 2.6 mg/dL (ref 2.5–4.6)

## 2018-03-21 LAB — BRAIN NATRIURETIC PEPTIDE: B NATRIURETIC PEPTIDE 5: 254 pg/mL — AB (ref 0.0–100.0)

## 2018-03-21 MED ORDER — POTASSIUM CHLORIDE CRYS ER 20 MEQ PO TBCR
40.0000 meq | EXTENDED_RELEASE_TABLET | Freq: Once | ORAL | Status: AC
  Administered 2018-03-22: 40 meq via ORAL
  Filled 2018-03-21: qty 2

## 2018-03-21 MED ORDER — FUROSEMIDE 10 MG/ML IJ SOLN
40.0000 mg | Freq: Once | INTRAMUSCULAR | Status: AC
  Administered 2018-03-21: 40 mg via INTRAVENOUS
  Filled 2018-03-21: qty 4

## 2018-03-21 MED ORDER — MAGNESIUM SULFATE 2 GM/50ML IV SOLN
2.0000 g | Freq: Once | INTRAVENOUS | Status: AC
  Administered 2018-03-22: 2 g via INTRAVENOUS
  Filled 2018-03-21: qty 50

## 2018-03-21 MED ORDER — METOPROLOL TARTRATE 5 MG/5ML IV SOLN
5.0000 mg | Freq: Once | INTRAVENOUS | Status: AC
  Administered 2018-03-22: 5 mg via INTRAVENOUS
  Filled 2018-03-21: qty 5

## 2018-03-21 MED ORDER — IOPAMIDOL (ISOVUE-370) INJECTION 76%
100.0000 mL | Freq: Once | INTRAVENOUS | Status: AC | PRN
  Administered 2018-03-21: 100 mL via INTRAVENOUS

## 2018-03-21 NOTE — ED Provider Notes (Addendum)
[facility] EMERGENCY DEPARTMENT Provider Note   CSN: [id number] Arrival date & time: 03/21/18  1928     History   Chief Complaint Chief Complaint  Patient presents with  . Shortness of Breath    HPI [name] [name] is a 84 y.o. female.  She has a history of CAD, A. fib on Coumadin.  She is complaining of increased shortness of breath that began yesterday.  There is been a cough but is not been productive of sputum.  She had a fever a few days ago.  She states she had a few seconds of chest pain earlier today but none now.  She denies any nausea vomiting diarrhea.  She has urinary frequency but she attributes that to her diuretics.  She states she has had a history of congestive heart failure and she feels like that was bothering her again.  She has been compliant with her medications.  Her last admission appears to been back in June.  She states she was treated for UTI about a month ago.  It sounds like a day or 2 ago she had an operation on her right great toe for a nonhealing ulcer and ended up taking off the tip of the toe. The history is provided by the patient.  Shortness of Breath  This is a recurrent problem. The average episode lasts 2 days. The problem occurs intermittently.The current episode started yesterday. The problem has been gradually worsening. Associated symptoms include a fever, cough, chest pain and leg swelling. Pertinent negatives include no headaches, no rhinorrhea, no sore throat, no neck pain, no sputum production, no hemoptysis, no syncope, no vomiting, no abdominal pain, no rash and no leg pain. It is unknown what precipitated the problem. She has tried nothing for the symptoms. The treatment provided no relief. She has had prior hospitalizations. She has had prior ED visits. Associated medical issues include pneumonia, CAD, heart failure and recent surgery.    Past Medical History:  Diagnosis Date  . Atrial fibrillation ([place])    On Coumadin  . Coronary  atherosclerosis of native coronary artery    Nonobstructive at cardiac catheterization 2004  . Dysphagia   . Essential hypertension, benign   . Hyperlipidemia   . Polymyalgia rheumatica ([place])   . Stroke [facility])     Patient Active Problem List   Diagnosis Date Noted  . Chest pain 01/21/2018  . Hypokalemia   . Hypoxia   . SOB (shortness of breath)   . Acute bronchitis   . Atypical chest pain 09/25/2015  . Acute diastolic CHF (congestive heart failure) ([place]) 12/29/2014  . Acute diastolic congestive heart failure ([place]) 12/29/2014  . Dyspnea   . Chronic diarrhea 07/04/2014  . Coronary atherosclerosis of native coronary artery 03/28/2014  . Long term (current) use of anticoagulants 11/26/2010  . HYPERLIPIDEMIA 05/11/2007  . Essential hypertension, benign 05/11/2007  . Chronic atrial fibrillation ([place]) 05/11/2007    Past Surgical History:  Procedure Laterality Date  . APPENDECTOMY    . COLONOSCOPY  2008   Dr.Fleshiman  . Left ankle    . TOE SURGERY    . TOTAL ABDOMINAL HYSTERECTOMY       OB History    Gravida      Para      Term      Preterm      AB      Living  4     SAB      TAB      Ectopic  Multiple      Live Births               Home Medications    Prior to Admission medications   Medication Sig Start Date End Date Taking? Authorizing Provider  acetaminophen (TYLENOL) 500 MG tablet Take 500-1,000 mg by mouth daily as needed for moderate pain.     [provider]  aspirin EC 81 MG tablet Take 81 mg by mouth daily.    [provider]  atenolol (TENORMIN) 50 MG tablet Take 1 tablet (50 mg total) by mouth at bedtime. 01/22/18   Manuella Ghazi, Pratik D, DO  atorvastatin (LIPITOR) 40 MG tablet Take 40 mg by mouth at bedtime.     [provider]  Biotin 1000 MCG tablet Take 1,000 mcg by mouth daily.    [provider]  Calcium Carbonate-Vitamin D (CALTRATE 600+D) 600-400 MG-UNIT per tablet Take 1 tablet by mouth daily.       [provider]  citalopram (CELEXA) 20 MG tablet Take 20 mg by mouth daily.    [provider]  dicyclomine (BENTYL) 20 MG tablet TAKE 1 TABLET BY MOUTH THREE TIMES DAILY FOR BOWEL PROBLEMS 12/30/17   [provider]  diltiazem (CARDIZEM CD) 120 MG 24 hr capsule Take 120 mg by mouth 2 (two) times daily.     [provider]  furosemide (LASIX) 40 MG tablet Take 40 mg by mouth daily.     [provider]  losartan (COZAAR) 50 MG tablet Take 50 mg by mouth every evening.     [provider]  metoprolol succinate (TOPROL-XL) 50 MG 24 hr tablet Take 75 mg by mouth daily. 10/01/17   [provider]  potassium chloride SA (K-DUR,KLOR-CON) 20 MEQ tablet TAKE ONE TABLET BY MOUTH EVERY DAY FOR LOW POTASSIUM 10/01/17   [provider]  predniSONE (DELTASONE) 5 MG tablet Take 10 mg by mouth daily.     [provider]  ranitidine (ZANTAC) 150 MG tablet Take 150 mg by mouth 2 (two) times daily. 10/01/17   [provider]  warfarin (COUMADIN) 4 MG tablet Take 2-4 mg by mouth See admin instructions. Patient takes 4mg  on one evening, then takes 2mg  for two evenings, then then takes 4mg , etc. (4mg , 2mg , 2mg , 4mg , etc)    [provider]    Family History Family History  Problem Relation Age of Onset  . Stroke Mother   . Hypertension Sister   . Hypertension Brother   . Rheum arthritis Daughter   . Healthy Son   . Healthy Son   . Healthy Son   . Hypertension Sister   . Heart disease Sister   . Heart disease Brother   . Hypertension Brother   . Arthritis Brother   . Colon cancer Brother   . Hypertension Brother   . Coronary artery disease Other     Social History Social History   Tobacco Use  . Smoking status: Never Smoker  . Smokeless tobacco: Never Used  Substance Use Topics  . Alcohol use: No    Alcohol/week: 0.0 oz  . Drug use: No     Allergies   Sulfonamide derivatives   Review of  Systems Review of Systems  Constitutional: Positive for fever.  HENT: Negative for rhinorrhea and sore throat.   Eyes: Negative for visual disturbance.  Respiratory: Positive for cough and shortness of breath. Negative for hemoptysis and sputum production.   Cardiovascular: Positive for chest pain and leg  swelling. Negative for syncope.  Gastrointestinal: Negative for abdominal pain and vomiting.  Genitourinary: Positive for frequency. Negative for dysuria.  Musculoskeletal: Negative for neck pain.  Skin: Negative for rash.  Neurological: Negative for headaches.     Physical Exam Updated Vital Signs BP (!) 149/75 (BP Location: Right Arm)   Pulse 97   Temp 98.6 F (37 C) (Oral)   Resp (!) 21   Ht 5\' 6"  (1.676 m)   Wt 71.2 kg (157 lb)   SpO2 100%   BMI 25.34 kg/m   Physical Exam  Constitutional: She appears well-developed and well-nourished.  HENT:  Head: Normocephalic and atraumatic.  Right Ear: External ear normal.  Left Ear: External ear normal.  Nose: Nose normal.  Mouth/Throat: Oropharynx is clear and moist.  Eyes: Conjunctivae are normal.  Neck: Normal range of motion. Neck supple. No tracheal deviation present.  Cardiovascular: Normal rate and normal pulses. An irregularly irregular rhythm present.  Pulmonary/Chest: Breath sounds normal. Accessory muscle usage present. No stridor. Tachypnea noted. No respiratory distress.  Abdominal: Soft. She exhibits no mass. There is no tenderness. There is no guarding.  Musculoskeletal: She exhibits edema (symmetric 1+).  Neurological: She is alert. GCS eye subscore is 4. GCS verbal subscore is 5. GCS motor subscore is 6.  Skin: Skin is warm and dry. Capillary refill takes less than 2 seconds.  Psychiatric: She has a normal mood and affect.  Nursing note and vitals reviewed.    ED Treatments / Results  Labs (all labs ordered are listed, but only abnormal results are displayed) Labs Reviewed  CBC - Abnormal; Notable for  the following components:      Result Value   Hemoglobin 11.9 (*)    HCT 35.3 (*)    All other components within normal limits  COMPREHENSIVE METABOLIC PANEL - Abnormal; Notable for the following components:   CO2 21 (*)    Glucose, Bld 114 (*)    GFR calc non Af Amer 54 (*)    All other components within normal limits  BRAIN NATRIURETIC PEPTIDE - Abnormal; Notable for the following components:   B Natriuretic Peptide 254.0 (*)    All other components within normal limits  TROPONIN I  PROTIME-INR  MAGNESIUM  PHOSPHORUS  PROTIME-INR    EKG EKG Interpretation  Date/Time:  Saturday March 21 2018 19:42:23 EDT Ventricular Rate:  100 PR Interval:    QRS Duration: 109 QT Interval:  412 QTC Calculation: 496 R Axis:   72 Text Interpretation:  Atrial fibrillation Anterior infarct, old Minimal ST depression, diffuse leads similar to prior Confirmed by Aletta Edouard [phone]) on 03/22/2018 11:03:24 AM   Radiology Dg Chest 2 View  Result Date: 03/21/2018 CLINICAL DATA:  Dyspnea since this morning. EXAM: CHEST - 2 VIEW COMPARISON:  01/21/2018 FINDINGS: Stable cardiomegaly with atherosclerosis of the thoracic aorta. Mild interstitial prominence with blunting the left costophrenic angle may reflect mild interstitial edema with trace left effusion. Small focus of atelectasis at the right lung base is suspected. Tiny focus of pneumonia is not entirely excluded. No acute nor suspicious osseous abnormality. IMPRESSION: 1. Stable cardiomegaly with aortic atherosclerosis. Mild interstitial edema with trace left effusion is suggested. 2. Small focus of pulmonary opacity at the right lung base likely reflects atelectasis and/or superimposition of ribs and pulmonary vasculature. Small focus of pneumonia is not entirely excluded. Electronically Signed   By: Ashley Royalty M.D.   On: 03/21/2018 20:23   Ct Angio Chest Pe W/cm &/or Wo Cm  Result Date: 03/21/2018 CLINICAL DATA:  Dyspnea since this morning  without chest pain. EXAM: CT ANGIOGRAPHY CHEST WITH CONTRAST TECHNIQUE: Multidetector CT imaging of the chest was performed using the standard protocol during bolus administration of intravenous contrast. Multiplanar CT image reconstructions and MIPs were obtained to evaluate the vascular anatomy. CONTRAST:  171mL ISOVUE-370 IOPAMIDOL (ISOVUE-370) INJECTION 76% COMPARISON:  03/21/2018 and 01/21/2018 CXR, chest CT 06/01/2016 FINDINGS: Cardiovascular: Satisfactory opacification of the pulmonary arteries to the segmental level. No evidence of pulmonary embolism. Cardiomegaly without pericardial effusion. Minimal coronary arteriosclerosis along the left circumflex. Nonaneurysmal atherosclerotic aorta is identified. Mediastinum/Nodes: Mild retroclavicular extension of the thyroid gland without dominant mass seen. Patent midline trachea and mainstem bronchi. No axillary, mediastinal or hilar lymphadenopathy. Unremarkable CT appearance of the esophagus. Lungs/Pleura: Biapical pleuroparenchymal scarring. No pulmonary edema, effusion or pneumothorax. 7 mm nodular densities are noted in the left lower lobe medially. Subsegmental scarring is also noted at the base. Upper Abdomen: Stable left hepatic 9 mm hypodensity dating back to 2017 consistent with a benign finding, statistically more likely to represent a cyst or hemangioma. Smaller hypodensity in the right hepatic lobe measuring 6 mm. No biliary dilatation. No splenomegaly. Musculoskeletal: No chest wall abnormality. Mild inferior endplate compression of T8 unchanged relative to 01/21/2018 chest radiograph. Review of the MIP images confirms the above findings. IMPRESSION: 1. Cardiomegaly with minimal coronary arteriosclerosis and aortic atherosclerosis. 2. No acute pulmonary disease. 3. No acute pulmonary embolus. 4. 7 mm nodular opacities in medial left lower lobe. Non-contrast chest CT at 3-6 months is recommended. If the nodules are stable at time of repeat CT, then  future CT at 18-24 months (from today's scan) is considered optional for low-risk patients, but is recommended for high-risk patients. This recommendation follows the consensus statement: Guidelines for Management of Incidental Pulmonary Nodules Detected on CT Images: From the Fleischner Society 2017; Radiology 2017; 284:228-243. Electronically Signed   By: Ashley Royalty M.D.   On: 03/21/2018 22:28    Procedures .Critical Care Performed by: Hayden Rasmussen, MD Authorized by: Hayden Rasmussen, MD   Critical care provider statement:    Critical care time (minutes):  30   Critical care was necessary to treat or prevent imminent or life-threatening deterioration of the following conditions:  Circulatory failure and respiratory failure   Critical care was time spent personally by me on the following activities:  Discussions with consultants, evaluation of patient's response to treatment, examination of patient, ordering and performing treatments and interventions, ordering and review of laboratory studies, ordering and review of radiographic studies, pulse oximetry, re-evaluation of patient's condition, obtaining history from patient or surrogate, review of old charts and development of treatment plan with patient or surrogate   I assumed direction of critical care for this patient from another provider in my specialty: no     (including critical care time)  Medications Ordered in ED Medications - No data to display   Initial Impression / Assessment and Plan / ED Course  I have reviewed the triage vital signs and the nursing notes.  Pertinent labs & imaging results that were available during my care of the patient were reviewed by me and considered in my medical decision making (see chart for details).  Clinical Course as of Mar 23 1103  Sat Mar 21, 2018  2029 Last echo - Echocardiogram11/03/2016: Study Conclusions  - Left ventricle: The cavity size was normal. Wall thickness  was increased in a pattern of mild LVH. Systolic function was normal.  The estimated ejection fraction was in the range of 55% to 60%. Wall motion was normal; there were no regional wall motion abnormalities. The study is not technically sufficient to allow evaluation of LV diastolic function. - Aortic valve: Mildly calcified annulus. Trileaflet; mildly calcified leaflets. There was trivial regurgitation. Mean gradient (S): 8 mm Hg. Peak gradient (S): 13 mm Hg. Valve area (VTI): 1.54 cm^2. - Mitral valve: Calcified annulus. Mildly thickened leaflets . There was mild regurgitation. - Left atrium: The atrium was severely dilated. - Right atrium: Central venous pressure (est): 3 mm Hg. - Tricuspid valve: There was trivial regurgitation. - Pulmonary arteries: PA peak pressure: 16 mm Hg (S). - Pericardium, extracardiac: There was no pericardial effusion.    [MB]  2029 Patient is here with increased shortness of breath since yesterday.  She feels its her congestive heart failure again.  Has some fluid on her lower extremities and is tachypneic but does not have a lot of lung sounds.    [MB]  2030 she did report a fever a few days ago and her chest x-ray said some interstitial fluid but possible pneumonia cannot be excluded.   [MB]  2030 Will wait for her BNP to come back and then.  She needs further imaging or antibiotics.   [MB]    Clinical Course User Index [MB] Hayden Rasmussen, MD   CHA2DS2/VAS Stroke Risk Points  Current as of 18 minutes ago     8 >= 2 Points: High Risk  1 - 1.99 Points: Medium Risk  0 Points: Low Risk    The patient's score has not changed in the past year.:  No Change     Details    This score determines the patient's risk of having a stroke if the  patient has atrial fibrillation.       Points Metrics  1 Has Congestive Heart Failure:  Yes    Current as of 18 minutes ago  1 Has Vascular Disease:  Yes    Current as of 18 minutes ago  1  Has Hypertension:  Yes    Current as of 18 minutes ago  2 Age:  20    Current as of 18 minutes ago  0 Has Diabetes:  No    Current as of 18 minutes ago  2 Had Stroke:  No  Had TIA:  Yes  Had thromboembolism:  No    Current as of 18 minutes ago  1 Female:  Yes    Current as of 18 minutes ago            Final Clinical Impressions(s) / ED Diagnoses   Final diagnoses:  Dyspnea on exertion  Atrial fibrillation with rapid ventricular response [facility])    ED Discharge Orders    None       Hayden Rasmussen, MD 03/22/18 1106    Hayden Rasmussen, MD 03/29/18 [phone]

## 2018-03-21 NOTE — ED Notes (Signed)
Ambulated pt to monitor O2 sats. O2 sats dropped to 97 on room air however heart rate went up to 147. Shortness of breath noted on ambulation

## 2018-03-21 NOTE — ED Triage Notes (Signed)
Pt states that she has been SOB since this morning. Pt denies CP, N/V.

## 2018-03-21 NOTE — ED Notes (Signed)
Patient transported to x-ray. ?

## 2018-03-21 NOTE — H&P (Signed)
History and Physical    [name] [name] LKG:[id number] DOB: 12-May-1930 DOA: 03/21/2018  PCP: Caryl Bis, MD   Patient coming from: Home.  I have personally briefly reviewed patient's old medical records in [place]  Chief Complaint: Shortness of breath.  HPI: [name] [name] is a 84 y.o. female with medical history significant of chronic atrial fibrillation on warfarin, nonobstructive CAD, depression, dysphagia, essential hypertension, hyperlipidemia, polymyalgia rheumatica, history of stroke, multiple shoulders surgeries who is coming to the emergency department due to shortness of breath associated with palpitations since early in the morning.  She denies chest pain, dizziness, diaphoresis, PND, orthopnea or pitting edema of the lower extremities.  No fever, no chills, no sore throat or productive cough.  Denies wheezing or hemoptysis.  Denies abdominal pain, nausea, emesis, constipation, melena or hematochezia.  She has frequent diarrhea due to IBS.  No dysuria, frequency or hematuria.  ED Course: Initial vital signs temperature 98.6 F, pulse 97, respirations 21, blood pressure 149/75 mmHg and O2 sat 100% on room air.  White count 7.7, hemoglobin 11.9 g/dL and platelets 314.  PT was 13.4 seconds and INR 1.02.  CMP shows a CO2 of 21 mmol/L and a glucose of 114 mg/dL.  All other chemistry values are normal.  Troponin was less than 0.03 ng/mL and BNP was 254 pg/mL.  EKG showed A. fib with RVR, old anterior infarct and minimal diffuse ST elevation.  Her chest radiograph show stable cardiomegaly with mild interstitial edema and trace left pleural effusion.  Right lung base atelectasis versus less likely pneumonia.  CTA chest did not show any acute pulmonary embolism.  Again demonstrated cardiomegaly with minimal CAD and aortic atherosclerosis.  There was a 7 mm nodular opacities in the medial left lower lobe.  Noncontrast chest CT is recommended in 3 to 6 months, then every 18 to 24 months.   Please see images and full radiology report for further detail.  Review of Systems: As per HPI otherwise 10 point review of systems negative.    Past Medical History:  Diagnosis Date  . Atrial fibrillation ([place])    On Coumadin  . Coronary atherosclerosis of native coronary artery    Nonobstructive at cardiac catheterization 2004  . Depression   . Dysphagia   . Essential hypertension, benign   . Hyperlipidemia   . Polymyalgia rheumatica ([place])   . Stroke [facility])     Past Surgical History:  Procedure Laterality Date  . APPENDECTOMY    . COLONOSCOPY  2008   Dr.Fleshiman  . Left ankle    . TOE SURGERY    . TOTAL ABDOMINAL HYSTERECTOMY       reports that she has never smoked. She has never used smokeless tobacco. She reports that she does not drink alcohol or use drugs.  Allergies  Allergen Reactions  . Sulfonamide Derivatives Rash    Family History  Problem Relation Age of Onset  . Stroke Mother   . Hypertension Sister   . Hypertension Brother   . Rheum arthritis Daughter   . Healthy Son   . Healthy Son   . Healthy Son   . Hypertension Sister   . Heart disease Sister   . Heart disease Brother   . Hypertension Brother   . Arthritis Brother   . Colon cancer Brother   . Hypertension Brother   . Coronary artery disease Other     Prior to Admission medications   Medication Sig Start Date End Date Taking?  Authorizing Provider  acetaminophen (TYLENOL) 500 MG tablet Take 500-1,000 mg by mouth daily as needed for moderate pain.    Yes [provider]  atenolol (TENORMIN) 50 MG tablet Take 1 tablet (50 mg total) by mouth at bedtime. 01/22/18  Yes Shah, Pratik D, DO  atorvastatin (LIPITOR) 40 MG tablet Take 40 mg by mouth at bedtime.    Yes [provider]  Biotin 1000 MCG tablet Take 1,000 mcg by mouth daily.   Yes [provider]  Calcium Carbonate-Vitamin D (CALTRATE 600+D) 600-400 MG-UNIT per tablet Take 1 tablet by mouth daily.     Yes  [provider]  citalopram (CELEXA) 20 MG tablet Take 20 mg by mouth daily.   Yes [provider]  dicyclomine (BENTYL) 20 MG tablet TAKE 1 TABLET BY MOUTH TWO TIMES DAILY FOR BOWEL PROBLEMS 12/30/17  Yes [provider]  diltiazem (CARDIZEM CD) 120 MG 24 hr capsule Take 120 mg by mouth 2 (two) times daily.    Yes [provider]  doxycycline (VIBRA-TABS) 100 MG tablet Take 100 mg by mouth 2 (two) times daily. 10 day course starting on 03/17/2018 03/17/18  Yes [provider]  furosemide (LASIX) 40 MG tablet Take 40 mg by mouth daily.    Yes [provider]  losartan (COZAAR) 50 MG tablet Take 50 mg by mouth every evening.    Yes [provider]  metoprolol succinate (TOPROL-XL) 50 MG 24 hr tablet Take 75 mg by mouth at bedtime.  10/01/17  Yes [provider]  potassium chloride SA (K-DUR,KLOR-CON) 20 MEQ tablet TAKE ONE TABLET BY MOUTH EVERY DAY FOR LOW POTASSIUM 10/01/17  Yes [provider]  predniSONE (DELTASONE) 5 MG tablet Take 10 mg by mouth daily.    Yes [provider]  ranitidine (ZANTAC) 150 MG tablet Take 150 mg by mouth 2 (two) times daily. 10/01/17  Yes [provider]  warfarin (COUMADIN) 4 MG tablet Take 2-4 mg by mouth See admin instructions. Patient takes 4mg  on one evening, then takes 2mg  for two evenings, then then takes 4mg , etc. (4mg , 2mg , 2mg , 4mg , etc)   Yes [provider]    Physical Exam: Vitals:   03/21/18 2130 03/21/18 2232 03/21/18 2308 03/21/18 2330  BP: 115/90 (!) 154/114 (!) 161/135 140/87  Pulse: 61 96 (!) 110 (!) 101  Resp: (!) 23 15 18 20   Temp:      TempSrc:      SpO2: 100% 100% 100% 100%  Weight:      Height:        Constitutional: NAD, calm, comfortable Eyes: PERRL, lids and conjunctivae normal ENMT: Mucous membranes are moist. Posterior pharynx clear of any exudate or lesions. Neck: normal, supple, no masses, no thyromegaly Respiratory: clear to  auscultation bilaterally, no wheezing, no crackles. Normal respiratory effort. No accessory muscle use.  Cardiovascular: Irregularly irregular, no murmurs / rubs / gallops. No extremity edema. 2+ pedal pulses. No carotid bruits.  Abdomen: Soft, no tenderness, no masses palpated. No hepatosplenomegaly. Bowel sounds positive.  Musculoskeletal: no clubbing / cyanosis.  Right toe dressing in place.  No discharge.  Good ROM, no contractures. Normal muscle tone.  Skin: Multiple areas of ecchymosis on remedies, particularly pretibial area bilaterally. Neurologic: CN 2-12 grossly intact. Sensation intact, DTR normal. Strength 5/5 in all 4.  Psychiatric: Normal judgment and insight. Alert and oriented x 4. Normal mood.   Labs on Admission: I have personally reviewed following labs and imaging studies  CBC: Recent Labs  Lab 03/20/18 1602 03/21/18 2000  WBC 10.5 7.7  NEUTROABS 9.1*  --   HGB 11.9* 11.9*  HCT 35.9* 35.3*  MCV 90.9 90.1  PLT 284 161   Basic Metabolic Panel: Recent Labs  Lab 03/20/18 1602 03/21/18 2000  NA 135 137  K 3.2* 3.7  CL 104 105  CO2 19* 21*  GLUCOSE 157* 114*  BUN 12 17  CREATININE 1.18* 0.93  CALCIUM 8.5* 9.0   GFR: Estimated Creatinine Clearance: 43.9 mL/min (by C-G formula based on SCr of 0.93 mg/dL). Liver Function Tests: Recent Labs  Lab 03/20/18 1602 03/21/18 2000  AST 23 23  ALT 13 15  ALKPHOS 64 64  BILITOT 0.8 0.7  PROT 6.7 7.0  ALBUMIN 3.3* 3.5   No results for input(s): LIPASE, AMYLASE in the last 168 hours. No results for input(s): AMMONIA in the last 168 hours. Coagulation Profile: Recent Labs  Lab 03/21/18 2000  INR 1.02   Cardiac Enzymes: Recent Labs  Lab 03/21/18 2000  TROPONINI <0.03   BNP (last 3 results) No results for input(s): PROBNP in the last 8760 hours. HbA1C: No results for input(s): HGBA1C in the last 72 hours. CBG: No results for input(s): GLUCAP in the last 168 hours. Lipid Profile: No results for  input(s): CHOL, HDL, LDLCALC, TRIG, CHOLHDL, LDLDIRECT in the last 72 hours. Thyroid Function Tests: No results for input(s): TSH, T4TOTAL, FREET4, T3FREE, THYROIDAB in the last 72 hours. Anemia Panel: No results for input(s): VITAMINB12, FOLATE, FERRITIN, TIBC, IRON, RETICCTPCT in the last 72 hours. Urine analysis:    Component Value Date/Time   COLORURINE STRAW (A) 01/21/2018 1724   APPEARANCEUR CLEAR 01/21/2018 1724   APPEARANCEUR Clear 10/28/2017 1422   LABSPEC 1.005 01/21/2018 1724   PHURINE 7.0 01/21/2018 1724   GLUCOSEU NEGATIVE 01/21/2018 1724   HGBUR NEGATIVE 01/21/2018 1724   BILIRUBINUR NEGATIVE 01/21/2018 1724   BILIRUBINUR Negative 10/28/2017 1422   KETONESUR NEGATIVE 01/21/2018 1724   PROTEINUR NEGATIVE 01/21/2018 1724   UROBILINOGEN 0.2 12/23/2011 2034   NITRITE NEGATIVE 01/21/2018 1724   LEUKOCYTESUR NEGATIVE 01/21/2018 1724   LEUKOCYTESUR Negative 10/28/2017 1422    Radiological Exams on Admission: Dg Chest 2 View  Result Date: 03/21/2018 CLINICAL DATA:  Dyspnea since this morning. EXAM: CHEST - 2 VIEW COMPARISON:  01/21/2018 FINDINGS: Stable cardiomegaly with atherosclerosis of the thoracic aorta. Mild interstitial prominence with blunting the left costophrenic angle may reflect mild interstitial edema with trace left effusion. Small focus of atelectasis at the right lung base is suspected. Tiny focus of pneumonia is not entirely excluded. No acute nor suspicious osseous abnormality. IMPRESSION: 1. Stable cardiomegaly with aortic atherosclerosis. Mild interstitial edema with trace left effusion is suggested. 2. Small focus of pulmonary opacity at the right lung base likely reflects atelectasis and/or superimposition of ribs and pulmonary vasculature. Small focus of pneumonia is not entirely excluded. Electronically Signed   By: Ashley Royalty M.D.   On: 03/21/2018 20:23   Ct Angio Chest Pe W/cm &/or Wo Cm  Result Date: 03/21/2018 CLINICAL DATA:  Dyspnea since this morning  without chest pain. EXAM: CT ANGIOGRAPHY CHEST WITH CONTRAST TECHNIQUE: Multidetector CT imaging of the chest was performed using the standard protocol during bolus administration of intravenous contrast. Multiplanar CT image reconstructions and MIPs were obtained to evaluate the vascular anatomy. CONTRAST:  159mL ISOVUE-370 IOPAMIDOL (ISOVUE-370) INJECTION 76% COMPARISON:  03/21/2018 and 01/21/2018 CXR, chest CT 06/01/2016 FINDINGS: Cardiovascular: Satisfactory opacification of the pulmonary arteries to the segmental  level. No evidence of pulmonary embolism. Cardiomegaly without pericardial effusion. Minimal coronary arteriosclerosis along the left circumflex. Nonaneurysmal atherosclerotic aorta is identified. Mediastinum/Nodes: Mild retroclavicular extension of the thyroid gland without dominant mass seen. Patent midline trachea and mainstem bronchi. No axillary, mediastinal or hilar lymphadenopathy. Unremarkable CT appearance of the esophagus. Lungs/Pleura: Biapical pleuroparenchymal scarring. No pulmonary edema, effusion or pneumothorax. 7 mm nodular densities are noted in the left lower lobe medially. Subsegmental scarring is also noted at the base. Upper Abdomen: Stable left hepatic 9 mm hypodensity dating back to 2017 consistent with a benign finding, statistically more likely to represent a cyst or hemangioma. Smaller hypodensity in the right hepatic lobe measuring 6 mm. No biliary dilatation. No splenomegaly. Musculoskeletal: No chest wall abnormality. Mild inferior endplate compression of T8 unchanged relative to 01/21/2018 chest radiograph. Review of the MIP images confirms the above findings. IMPRESSION: 1. Cardiomegaly with minimal coronary arteriosclerosis and aortic atherosclerosis. 2. No acute pulmonary disease. 3. No acute pulmonary embolus. 4. 7 mm nodular opacities in medial left lower lobe. Non-contrast chest CT at 3-6 months is recommended. If the nodules are stable at time of repeat CT, then  future CT at 18-24 months (from today's scan) is considered optional for low-risk patients, but is recommended for high-risk patients. This recommendation follows the consensus statement: Guidelines for Management of Incidental Pulmonary Nodules Detected on CT Images: From the Fleischner Society 2017; Radiology 2017; 284:228-243. Electronically Signed   By: Ashley Royalty M.D.   On: 03/21/2018 22:28    EKG: Independently reviewed.  Vent. rate 100 BPM PR interval * ms QRS duration 109 ms QT/QTc 412/496 ms P-R-T axes * 72 37 Atrial fibrillation Anterior infarct, old Minimal ST depression, diffuse leads  Assessment/Plan Principal Problem:   Atrial fibrillation with rapid ventricular response (HCC) Likely due to volume overload. CHA?DS?-VASc Score is at least 5. Rapid ventricular response resolve with IV metoprolol and electrolyte optimization. Continue Cardizem 920 mg p.o. twice daily. Continue metoprolol 75 mg p.o. at bedtime. Continue warfarin per pharmacy.  Active Problems:   Acute diastolic CHF (congestive heart failure) (HCC) Continue IV furosemide. Monitor intake and output. Monitor daily weights. Check echocardiogram     Hyperlipidemia Continue atorvastatin 40 mg p.o. bedtime. Monitor LFTs as needed.    Essential hypertension, benign Continue Cardizem 120 mg p.o. twice daily. Continue losartan 50 mg p.o. at bedtime. Continue metoprolol succinate 75 mg p.o. at bedtime. Monitor blood pressure, heart rate, renal function electrolytes.    Polymyalgia rheumatica (HCC) Continue prednisone 10 mg p.o. daily.    Coronary atherosclerosis of native coronary artery Denies chest pain. On atorvastatin, metoprolol and warfarin.    Depression Continue citalopram 20 mg p.o. daily.   DVT prophylaxis: On warfarin. Code Status:.  Full code. Family Communication: Her grandson was present in the room. Disposition Plan: Observation for rate control. Consults called:  Admission  status: Observation/telemetry.   Reubin Milan MD [facility] Pager [phone].  If 7PM-7AM, please contact night-coverage [email or web address] Password [facility]  03/21/2018, 11:44 PM

## 2018-03-22 ENCOUNTER — Observation Stay (HOSPITAL_BASED_OUTPATIENT_CLINIC_OR_DEPARTMENT_OTHER): Payer: Medicare Other

## 2018-03-22 DIAGNOSIS — E785 Hyperlipidemia, unspecified: Secondary | ICD-10-CM | POA: Diagnosis not present

## 2018-03-22 DIAGNOSIS — I4891 Unspecified atrial fibrillation: Secondary | ICD-10-CM | POA: Diagnosis not present

## 2018-03-22 DIAGNOSIS — M353 Polymyalgia rheumatica: Secondary | ICD-10-CM

## 2018-03-22 DIAGNOSIS — I10 Essential (primary) hypertension: Secondary | ICD-10-CM | POA: Diagnosis not present

## 2018-03-22 DIAGNOSIS — I5031 Acute diastolic (congestive) heart failure: Secondary | ICD-10-CM

## 2018-03-22 DIAGNOSIS — I340 Nonrheumatic mitral (valve) insufficiency: Secondary | ICD-10-CM | POA: Diagnosis not present

## 2018-03-22 DIAGNOSIS — I2510 Atherosclerotic heart disease of native coronary artery without angina pectoris: Secondary | ICD-10-CM | POA: Diagnosis not present

## 2018-03-22 DIAGNOSIS — I480 Paroxysmal atrial fibrillation: Secondary | ICD-10-CM | POA: Diagnosis not present

## 2018-03-22 LAB — ECHOCARDIOGRAM COMPLETE
Height: 66 in
WEIGHTICAEL: 2542.4 [oz_av]

## 2018-03-22 LAB — PROTIME-INR
INR: 1.08
PROTHROMBIN TIME: 13.9 s (ref 11.4–15.2)

## 2018-03-22 MED ORDER — WARFARIN SODIUM 5 MG PO TABS: 5.0000 mg | ORAL_TABLET | Freq: Once | ORAL | Status: DC

## 2018-03-22 MED ORDER — METOPROLOL TARTRATE 5 MG/5ML IV SOLN: 2.5000 mg | INTRAVENOUS | Status: DC | PRN

## 2018-03-22 MED ORDER — DICYCLOMINE HCL 20 MG PO TABS
20.0000 mg | ORAL_TABLET | Freq: Two times a day (BID) | ORAL | Status: DC
  Filled 2018-03-22 (×3): qty 1

## 2018-03-22 MED ORDER — DICYCLOMINE HCL 10 MG PO CAPS
20.0000 mg | ORAL_CAPSULE | Freq: Two times a day (BID) | ORAL | Status: DC
  Administered 2018-03-22 – 2018-03-23 (×3): 20 mg via ORAL
  Filled 2018-03-22 (×3): qty 2

## 2018-03-22 MED ORDER — ACETAMINOPHEN 325 MG PO TABS: 650.0000 mg | ORAL_TABLET | Freq: Four times a day (QID) | ORAL | Status: DC | PRN

## 2018-03-22 MED ORDER — PREDNISONE 10 MG PO TABS
10.0000 mg | ORAL_TABLET | Freq: Every day | ORAL | Status: DC
  Administered 2018-03-22 – 2018-03-23 (×2): 10 mg via ORAL
  Filled 2018-03-22 (×2): qty 1

## 2018-03-22 MED ORDER — WARFARIN SODIUM 2 MG PO TABS
2.0000 mg | ORAL_TABLET | Freq: Once | ORAL | Status: AC
  Administered 2018-03-22: 2 mg via ORAL
  Filled 2018-03-22: qty 1

## 2018-03-22 MED ORDER — FUROSEMIDE 40 MG PO TABS: 40.0000 mg | ORAL_TABLET | Freq: Every day | ORAL | Status: DC

## 2018-03-22 MED ORDER — FUROSEMIDE 40 MG PO TABS
40.0000 mg | ORAL_TABLET | Freq: Every day | ORAL | Status: DC
  Administered 2018-03-23: 40 mg via ORAL
  Filled 2018-03-22: qty 1

## 2018-03-22 MED ORDER — LOSARTAN POTASSIUM 50 MG PO TABS
50.0000 mg | ORAL_TABLET | Freq: Every evening | ORAL | Status: DC
  Administered 2018-03-22: 50 mg via ORAL
  Filled 2018-03-22: qty 1

## 2018-03-22 MED ORDER — DILTIAZEM HCL 60 MG PO TABS
120.0000 mg | ORAL_TABLET | Freq: Two times a day (BID) | ORAL | Status: DC
  Administered 2018-03-22 – 2018-03-23 (×2): 120 mg via ORAL
  Filled 2018-03-22 (×2): qty 2

## 2018-03-22 MED ORDER — ENOXAPARIN SODIUM 80 MG/0.8ML ~~LOC~~ SOLN
1.0000 mg/kg | Freq: Once | SUBCUTANEOUS | Status: AC
  Administered 2018-03-22: 70 mg via SUBCUTANEOUS
  Filled 2018-03-22: qty 0.8

## 2018-03-22 MED ORDER — FAMOTIDINE 20 MG PO TABS
20.0000 mg | ORAL_TABLET | Freq: Two times a day (BID) | ORAL | Status: DC
  Administered 2018-03-22 – 2018-03-23 (×3): 20 mg via ORAL
  Filled 2018-03-22 (×3): qty 1

## 2018-03-22 MED ORDER — BIOTIN 1000 MCG PO TABS: 1000.0000 ug | ORAL_TABLET | Freq: Every day | ORAL | Status: DC

## 2018-03-22 MED ORDER — CALCIUM CARBONATE-VITAMIN D 500-200 MG-UNIT PO TABS
1.0000 | ORAL_TABLET | Freq: Every day | ORAL | Status: DC
  Administered 2018-03-22 – 2018-03-23 (×2): 1 via ORAL
  Filled 2018-03-22 (×4): qty 1

## 2018-03-22 MED ORDER — DOXYCYCLINE HYCLATE 100 MG PO TABS
100.0000 mg | ORAL_TABLET | Freq: Two times a day (BID) | ORAL | Status: DC
  Administered 2018-03-22 – 2018-03-23 (×3): 100 mg via ORAL
  Filled 2018-03-22 (×3): qty 1

## 2018-03-22 MED ORDER — POTASSIUM CHLORIDE CRYS ER 20 MEQ PO TBCR
40.0000 meq | EXTENDED_RELEASE_TABLET | Freq: Once | ORAL | Status: AC
  Administered 2018-03-22: 40 meq via ORAL
  Filled 2018-03-22: qty 2

## 2018-03-22 MED ORDER — ACETAMINOPHEN 650 MG RE SUPP: 650.0000 mg | Freq: Four times a day (QID) | RECTAL | Status: DC | PRN

## 2018-03-22 MED ORDER — FUROSEMIDE 10 MG/ML IJ SOLN
40.0000 mg | Freq: Once | INTRAMUSCULAR | Status: AC
  Administered 2018-03-22: 40 mg via INTRAVENOUS
  Filled 2018-03-22: qty 4

## 2018-03-22 MED ORDER — ONDANSETRON HCL 4 MG/2ML IJ SOLN: 4.0000 mg | Freq: Four times a day (QID) | INTRAMUSCULAR | Status: DC | PRN

## 2018-03-22 MED ORDER — ATORVASTATIN CALCIUM 40 MG PO TABS
40.0000 mg | ORAL_TABLET | Freq: Every day | ORAL | Status: DC
  Administered 2018-03-22: 40 mg via ORAL
  Filled 2018-03-22: qty 1

## 2018-03-22 MED ORDER — WARFARIN - PHARMACIST DOSING INPATIENT
Freq: Every day | Status: DC
Start: 2018-03-22 — End: 2018-03-23
  Administered 2018-03-22: 19:00:00

## 2018-03-22 MED ORDER — DILTIAZEM HCL ER COATED BEADS 120 MG PO CP24
120.0000 mg | ORAL_CAPSULE | Freq: Two times a day (BID) | ORAL | Status: DC
  Administered 2018-03-22 (×2): 120 mg via ORAL
  Filled 2018-03-22 (×2): qty 1

## 2018-03-22 MED ORDER — ONDANSETRON HCL 4 MG PO TABS: 4.0000 mg | ORAL_TABLET | Freq: Four times a day (QID) | ORAL | Status: DC | PRN

## 2018-03-22 MED ORDER — METOPROLOL SUCCINATE ER 50 MG PO TB24
75.0000 mg | ORAL_TABLET | Freq: Every day | ORAL | Status: DC
  Administered 2018-03-22 (×2): 75 mg via ORAL
  Filled 2018-03-22 (×2): qty 1

## 2018-03-22 MED ORDER — WARFARIN SODIUM 2 MG PO TABS
4.0000 mg | ORAL_TABLET | Freq: Once | ORAL | Status: AC
  Administered 2018-03-22: 4 mg via ORAL
  Filled 2018-03-22: qty 2

## 2018-03-22 MED ORDER — CITALOPRAM HYDROBROMIDE 20 MG PO TABS
20.0000 mg | ORAL_TABLET | Freq: Every day | ORAL | Status: DC
  Administered 2018-03-22 – 2018-03-23 (×2): 20 mg via ORAL
  Filled 2018-03-22 (×2): qty 1

## 2018-03-22 MED ORDER — POTASSIUM CHLORIDE CRYS ER 20 MEQ PO TBCR
20.0000 meq | EXTENDED_RELEASE_TABLET | Freq: Every day | ORAL | Status: DC
  Administered 2018-03-22: 20 meq via ORAL
  Filled 2018-03-22: qty 1

## 2018-03-22 NOTE — Progress Notes (Signed)
ANTICOAGULATION CONSULT NOTE - Preliminary  Pharmacy Consult for warfarin Indication: atrial fibrillation  Allergies  Allergen Reactions  . Sulfonamide Derivatives Rash    Patient Measurements: Height: 5\' 6"  (167.6 cm) Weight: 158 lb 14.4 oz (72.1 kg) IBW/kg (Calculated) : 59.3 HEPARIN DW (KG): 72.1   Vital Signs: Temp: 98.7 F (37.1 C) (08/04 0130) Temp Source: Oral (08/04 0130) BP: 157/80 (08/04 0130) Pulse Rate: 73 (08/04 0130)  Labs: Recent Labs    03/20/18 1602 03/21/18 2000  HGB 11.9* 11.9*  HCT 35.9* 35.3*  PLT 284 314  LABPROT  --  13.4  INR  --  1.02  CREATININE 1.18* 0.93  TROPONINI  --  <0.03   Estimated Creatinine Clearance: 44.1 mL/min (by C-G formula based on SCr of 0.93 mg/dL).  Medical History: Past Medical History:  Diagnosis Date  . Atrial fibrillation ([place])    On Coumadin  . Coronary atherosclerosis of native coronary artery    Nonobstructive at cardiac catheterization 2004  . Depression   . Dysphagia   . Essential hypertension, benign   . Hyperlipidemia   . Polymyalgia rheumatica ([place])   . Stroke [facility])     Medications:  Scheduled:  . atorvastatin  40 mg Oral QHS  . calcium-vitamin D  1 tablet Oral Daily  . citalopram  20 mg Oral Daily  . dicyclomine  20 mg Oral BID  . diltiazem  120 mg Oral BID  . doxycycline  100 mg Oral BID  . enoxaparin (LOVENOX) injection  1 mg/kg Subcutaneous Once  . famotidine  20 mg Oral BID  . furosemide  40 mg Oral Daily  . losartan  50 mg Oral QPM  . metoprolol succinate  75 mg Oral QHS  . potassium chloride SA  20 mEq Oral Daily  . predniSONE  10 mg Oral Daily  . warfarin  4 mg Oral Once  . Warfarin - Pharmacist Dosing Inpatient   Does not apply q1800    Assessment: 84 yo female with atrial fibrillation.  Pharmacy is being consulted for warfarin dosing.  Current INR=1.02.  Pt normally takes warfarin 2mg  for 2 days then 4mg . Will   Goal of Therapy:  INR 2-3   Plan:  Warfarin 4mg  now Will  bridge with Lovenox until INR therapeutic Preliminary review of pertinent patient information completed.  Forestine Na clinical pharmacist will complete review during morning rounds to assess the patient and finalize treatment regimen.  Jacquelyn Antony Scarlett, RPH 03/22/2018,1:52 AM

## 2018-03-22 NOTE — Progress Notes (Signed)
*  PRELIMINARY RESULTS* Echocardiogram 2D Echocardiogram has been performed.  [name] [name] 03/22/2018, 12:02 PM

## 2018-03-22 NOTE — Progress Notes (Signed)
ANTICOAGULATION CONSULT NOTE - Preliminary  Pharmacy Consult for warfarin Indication: atrial fibrillation  Allergies  Allergen Reactions  . Sulfonamide Derivatives Rash    Patient Measurements: Height: 5\' 6"  (167.6 cm) Weight: 158 lb 14.4 oz (72.1 kg) IBW/kg (Calculated) : 59.3 HEPARIN DW (KG): 72.1   Vital Signs: Temp: 98.1 F (36.7 C) (08/04 0632) Temp Source: Oral (08/04 5366) BP: 131/70 (08/04 4403) Pulse Rate: 78 (08/04 0632)  Labs: Recent Labs    03/20/18 1602 03/21/18 2000 03/22/18 0913  HGB 11.9* 11.9*  --   HCT 35.9* 35.3*  --   PLT 284 314  --   LABPROT  --  13.4 13.9  INR  --  1.02 1.08  CREATININE 1.18* 0.93  --   TROPONINI  --  <0.03  --    Estimated Creatinine Clearance: 44.1 mL/min (by C-G formula based on SCr of 0.93 mg/dL).  Medical History: Past Medical History:  Diagnosis Date  . Atrial fibrillation ([place])    On Coumadin  . Coronary atherosclerosis of native coronary artery    Nonobstructive at cardiac catheterization 2004  . Depression   . Dysphagia   . Essential hypertension, benign   . Hyperlipidemia   . Polymyalgia rheumatica ([place])   . Stroke [facility])     Medications:  Scheduled:  . atorvastatin  40 mg Oral QHS  . calcium-vitamin D  1 tablet Oral Daily  . citalopram  20 mg Oral Daily  . dicyclomine  20 mg Oral BID  . diltiazem  120 mg Oral BID  . doxycycline  100 mg Oral BID  . famotidine  20 mg Oral BID  . [START ON 03/23/2018] furosemide  40 mg Oral Daily  . losartan  50 mg Oral QPM  . metoprolol succinate  75 mg Oral QHS  . potassium chloride SA  20 mEq Oral Daily  . predniSONE  10 mg Oral Daily  . Warfarin - Pharmacist Dosing Inpatient   Does not apply q1800    Assessment: 84 yo female with atrial fibrillation.  Pharmacy is being consulted for warfarin dosing.  Current INR=1.08.  Patients home dose is 4 mg x 1 day, then 2 mg x 2 days, then repeat.  Goal of Therapy:  INR 2-3  Monitor platelets by anticoagulation  protocol: Yes   Plan:  Warfarin 2 mg x 1 dose Will bridge with Lovenox until INR therapeutic x24 hours Monitor daily INR, labs, and s/s of bleeding   Margot Ables, PharmD Clinical Pharmacist 03/22/2018 10:13 AM

## 2018-03-22 NOTE — Progress Notes (Addendum)
PROGRESS NOTE    [name] [name]  DTO:[id number]  DOB: 1930/01/24  DOA: 03/21/2018 PCP: Caryl Bis, MD   Brief Admission Hx: [name] [name] is a 84 y.o. female with medical history significant of chronic atrial fibrillation on warfarin, nonobstructive CAD, depression, dysphagia, essential hypertension, hyperlipidemia, polymyalgia rheumatica, history of stroke, multiple shoulders surgeries who is coming to the emergency department due to shortness of breath associated with palpitations since early in the morning.  She was admitted with acute diastolic CHF exacerbation.  Her AFib RVR rapidly improved with IV metoprolol.   MDM/Assessment & Plan:   1. Atrial Fibrillation with RVR - Her rate rapidly improved with IV metoprolol.  Her rate is now controlled.  She has been restarted on home metoprolol and cardizem. Continue warfarin for anticoagulation.  2. Acute diastolic CHF exacerbation - IV lasix has been ordered and patient has been resumed on her home lasix starting 8/5.  She is already diuresing and feeling better.  Follow.   3. Hyperlipidemia- resumed home atorvastatin QHS.  4. Essential hypertesion - controlled on home medication regimen which has been continued.  5. Polymyalgia rheumatica - resumed on home prednisone 10 mg daily.  6. CAD - stable, no symptoms, resume home medications.  7. Depression - resume citalopram 20 mg daily.   DVT prophylaxis: warfarin  Code Status: Full  Family Communication: at bedside Disposition Plan: home tomorrow if stable    Echocardiogram 03/22/18 Study Conclusions  - Left ventricle: The cavity size was normal. Wall thickness was   normal. Systolic function was normal. The estimated ejection   fraction was in the range of 55% to 60%. Wall motion was normal;   there were no regional wall motion abnormalities. The study was   not technically sufficient to allow evaluation of LV diastolic   dysfunction due to atrial fibrillation. - Aortic valve:  Mildly to moderately calcified annulus. Mildly   calcified leaflets. There was trivial regurgitation.   Morphologically, there appears to be at least moderate calcific aortic stenosis. - Mitral valve: Calcified annulus. There was moderate regurgitation. - Left atrium: The atrium was severely dilated. - Right ventricle: Systolic function was mildly reduced. - Right atrium: The atrium was mildly dilated. - Atrial septum: No defect or patent foramen ovale was identified. - Tricuspid valve: There was mild-moderate regurgitation. - Pulmonary arteries: PA peak pressure: 39 mm Hg (S).   Subjective: [name] [name] says that she has been urinating and feeling a lot better.    Objective: Vitals:   03/22/18 0127 03/22/18 0130 03/22/18 0134 03/22/18 0632  BP:  (!) 157/80  131/70  Pulse:  73  78  Resp:  19  18  Temp:  98.7 F (37.1 C)  98.1 F (36.7 C)  TempSrc:  Oral  Oral  SpO2: 98% 100%  99%  Weight:   72.1 kg (158 lb 14.4 oz)   Height:   5\' 6"  (1.676 m)     Intake/Output Summary (Last 24 hours) at 03/22/2018 1257 Last data filed at 03/22/2018 1115 Gross per 24 hour  Intake 170 ml  Output 1400 ml  Net -1230 ml   Filed Weights   03/21/18 1933 03/22/18 0134  Weight: 71.2 kg (157 lb) 72.1 kg (158 lb 14.4 oz)   REVIEW OF SYSTEMS  As per history otherwise all reviewed and reported negative  Exam:  General exam: awake, alert, NAD, cooperative.  Respiratory system: Clear. No increased work of breathing. Cardiovascular system: S1 & S2 heard, RRR. No JVD, murmurs,  gallops, clicks or pedal edema. Gastrointestinal system: Abdomen is nondistended, soft and nontender. Normal bowel sounds heard. Central nervous system: Alert and oriented. No focal neurological deficits. Extremities: trace pretibial edema.   Data Reviewed: Basic Metabolic Panel: Recent Labs  Lab 03/20/18 1602 03/21/18 2000  NA 135 137  K 3.2* 3.7  CL 104 105  CO2 19* 21*  GLUCOSE 157* 114*  BUN 12 17  CREATININE 1.18* 0.93    CALCIUM 8.5* 9.0  MG  --  1.8  PHOS  --  2.6   Liver Function Tests: Recent Labs  Lab 03/20/18 1602 03/21/18 2000  AST 23 23  ALT 13 15  ALKPHOS 64 64  BILITOT 0.8 0.7  PROT 6.7 7.0  ALBUMIN 3.3* 3.5   No results for input(s): LIPASE, AMYLASE in the last 168 hours. No results for input(s): AMMONIA in the last 168 hours. CBC: Recent Labs  Lab 03/20/18 1602 03/21/18 2000  WBC 10.5 7.7  NEUTROABS 9.1*  --   HGB 11.9* 11.9*  HCT 35.9* 35.3*  MCV 90.9 90.1  PLT 284 314   Cardiac Enzymes: Recent Labs  Lab 03/21/18 2000  TROPONINI <0.03   CBG (last 3)  No results for input(s): GLUCAP in the last 72 hours. No results found for this or any previous visit (from the past 240 hour(s)).   Studies: Dg Chest 2 View  Result Date: 03/21/2018 CLINICAL DATA:  Dyspnea since this morning. EXAM: CHEST - 2 VIEW COMPARISON:  01/21/2018 FINDINGS: Stable cardiomegaly with atherosclerosis of the thoracic aorta. Mild interstitial prominence with blunting the left costophrenic angle may reflect mild interstitial edema with trace left effusion. Small focus of atelectasis at the right lung base is suspected. Tiny focus of pneumonia is not entirely excluded. No acute nor suspicious osseous abnormality. IMPRESSION: 1. Stable cardiomegaly with aortic atherosclerosis. Mild interstitial edema with trace left effusion is suggested. 2. Small focus of pulmonary opacity at the right lung base likely reflects atelectasis and/or superimposition of ribs and pulmonary vasculature. Small focus of pneumonia is not entirely excluded. Electronically Signed   By: Ashley Royalty M.D.   On: 03/21/2018 20:23   Ct Angio Chest Pe W/cm &/or Wo Cm  Result Date: 03/21/2018 CLINICAL DATA:  Dyspnea since this morning without chest pain. EXAM: CT ANGIOGRAPHY CHEST WITH CONTRAST TECHNIQUE: Multidetector CT imaging of the chest was performed using the standard protocol during bolus administration of intravenous contrast. Multiplanar  CT image reconstructions and MIPs were obtained to evaluate the vascular anatomy. CONTRAST:  146mL ISOVUE-370 IOPAMIDOL (ISOVUE-370) INJECTION 76% COMPARISON:  03/21/2018 and 01/21/2018 CXR, chest CT 06/01/2016 FINDINGS: Cardiovascular: Satisfactory opacification of the pulmonary arteries to the segmental level. No evidence of pulmonary embolism. Cardiomegaly without pericardial effusion. Minimal coronary arteriosclerosis along the left circumflex. Nonaneurysmal atherosclerotic aorta is identified. Mediastinum/Nodes: Mild retroclavicular extension of the thyroid gland without dominant mass seen. Patent midline trachea and mainstem bronchi. No axillary, mediastinal or hilar lymphadenopathy. Unremarkable CT appearance of the esophagus. Lungs/Pleura: Biapical pleuroparenchymal scarring. No pulmonary edema, effusion or pneumothorax. 7 mm nodular densities are noted in the left lower lobe medially. Subsegmental scarring is also noted at the base. Upper Abdomen: Stable left hepatic 9 mm hypodensity dating back to 2017 consistent with a benign finding, statistically more likely to represent a cyst or hemangioma. Smaller hypodensity in the right hepatic lobe measuring 6 mm. No biliary dilatation. No splenomegaly. Musculoskeletal: No chest wall abnormality. Mild inferior endplate compression of T8 unchanged relative to 01/21/2018 chest radiograph. Review of  the MIP images confirms the above findings. IMPRESSION: 1. Cardiomegaly with minimal coronary arteriosclerosis and aortic atherosclerosis. 2. No acute pulmonary disease. 3. No acute pulmonary embolus. 4. 7 mm nodular opacities in medial left lower lobe. Non-contrast chest CT at 3-6 months is recommended. If the nodules are stable at time of repeat CT, then future CT at 18-24 months (from today's scan) is considered optional for low-risk patients, but is recommended for high-risk patients. This recommendation follows the consensus statement: Guidelines for Management of  Incidental Pulmonary Nodules Detected on CT Images: From the Fleischner Society 2017; Radiology 2017; 284:228-243. Electronically Signed   By: Ashley Royalty M.D.   On: 03/21/2018 22:28   Scheduled Meds: . atorvastatin  40 mg Oral QHS  . calcium-vitamin D  1 tablet Oral Daily  . citalopram  20 mg Oral Daily  . dicyclomine  20 mg Oral BID  . diltiazem  120 mg Oral BID  . doxycycline  100 mg Oral BID  . famotidine  20 mg Oral BID  . [START ON 03/23/2018] furosemide  40 mg Oral Daily  . losartan  50 mg Oral QPM  . metoprolol succinate  75 mg Oral QHS  . potassium chloride SA  20 mEq Oral Daily  . predniSONE  10 mg Oral Daily  . warfarin  2 mg Oral Once  . Warfarin - Pharmacist Dosing Inpatient   Does not apply q1800   Continuous Infusions:  Principal Problem:   Atrial fibrillation with rapid ventricular response (HCC) Active Problems:   Hyperlipidemia   Essential hypertension, benign   Polymyalgia rheumatica (HCC)   Coronary atherosclerosis of native coronary artery   Acute diastolic CHF (congestive heart failure) ([place])   Depression   Time spent:   Irwin Brakeman, MD, FAAFP [facility] Pager [phone]  If 7PM-7AM, please contact night-coverage [email or web address] Password [email or web address] 03/22/2018, 12:57 PM    LOS: 0 days

## 2018-03-23 DIAGNOSIS — E785 Hyperlipidemia, unspecified: Secondary | ICD-10-CM | POA: Diagnosis not present

## 2018-03-23 DIAGNOSIS — I2510 Atherosclerotic heart disease of native coronary artery without angina pectoris: Secondary | ICD-10-CM | POA: Diagnosis not present

## 2018-03-23 DIAGNOSIS — I10 Essential (primary) hypertension: Secondary | ICD-10-CM | POA: Diagnosis not present

## 2018-03-23 DIAGNOSIS — I4891 Unspecified atrial fibrillation: Secondary | ICD-10-CM | POA: Diagnosis not present

## 2018-03-23 DIAGNOSIS — I480 Paroxysmal atrial fibrillation: Secondary | ICD-10-CM | POA: Diagnosis not present

## 2018-03-23 DIAGNOSIS — M353 Polymyalgia rheumatica: Secondary | ICD-10-CM | POA: Diagnosis not present

## 2018-03-23 DIAGNOSIS — I5031 Acute diastolic (congestive) heart failure: Secondary | ICD-10-CM | POA: Diagnosis not present

## 2018-03-23 LAB — BASIC METABOLIC PANEL
Anion gap: 7 (ref 5–15)
BUN: 17 mg/dL (ref 8–23)
CHLORIDE: 108 mmol/L (ref 98–111)
CO2: 24 mmol/L (ref 22–32)
Calcium: 8.7 mg/dL — ABNORMAL LOW (ref 8.9–10.3)
Creatinine, Ser: 1.03 mg/dL — ABNORMAL HIGH (ref 0.44–1.00)
GFR calc Af Amer: 55 mL/min — ABNORMAL LOW (ref >60)
GFR calc non Af Amer: 48 mL/min — ABNORMAL LOW (ref >60)
GLUCOSE: 103 mg/dL — AB (ref 70–99)
POTASSIUM: 4 mmol/L (ref 3.5–5.1)
Sodium: 139 mmol/L (ref 135–145)

## 2018-03-23 LAB — CBC
HEMATOCRIT: 34.6 % — AB (ref 36.0–46.0)
HEMOGLOBIN: 11.3 g/dL — AB (ref 12.0–15.0)
MCH: 30.1 pg (ref 26.0–34.0)
MCHC: 32.7 g/dL (ref 30.0–36.0)
MCV: 92.3 fL (ref 78.0–100.0)
Platelets: 308 10*3/uL (ref 150–400)
RBC: 3.75 MIL/uL — AB (ref 3.87–5.11)
RDW: 14 % (ref 11.5–15.5)
WBC: 7.3 10*3/uL (ref 4.0–10.5)

## 2018-03-23 LAB — PROTIME-INR
INR: 1.2
Prothrombin Time: 15.1 seconds (ref 11.4–15.2)

## 2018-03-23 LAB — MAGNESIUM: MAGNESIUM: 2.2 mg/dL (ref 1.7–2.4)

## 2018-03-23 MED ORDER — ENOXAPARIN SODIUM 80 MG/0.8ML ~~LOC~~ SOLN: 70.0000 mg | Freq: Two times a day (BID) | SUBCUTANEOUS | 0 refills | Status: DC

## 2018-03-23 MED ORDER — ENOXAPARIN SODIUM 80 MG/0.8ML ~~LOC~~ SOLN: 70.0000 mg | SUBCUTANEOUS | 0 refills | Status: DC

## 2018-03-23 MED ORDER — METOPROLOL SUCCINATE ER 25 MG PO TB24: 75.0000 mg | ORAL_TABLET | Freq: Every day | ORAL | 0 refills | Status: DC

## 2018-03-23 NOTE — Discharge Instructions (Signed)
PLEASE TAKE LOVENOX  INJECTIONS UNTIL YOUR INR IS GREATER THAN 2.   HOME HEALTH NURSE TO CHECK PT/INR ON 8/7 AND 8/9 AND REPORT RESULTS TO PCP AND CARDIOLOGIST.  SEEK MEDICAL CARE OR RETURN TO ER FOR ANY SIGNS OF BLEEDING COMPLICATIONS.   Follow with Primary MD  Caryl Bis, MD  and other consultants as instructed your Hospitalist MD  Please get a complete blood count and chemistry panel checked by your Primary MD at your next visit, and again as instructed by your Primary MD.  Get Medicines reviewed and adjusted: Please take all your medications with you for your next visit with your Primary MD  Laboratory/radiological data: Please request your Primary MD to go over all hospital tests and procedure/radiological results at the follow up, please ask your Primary MD to get all Hospital records sent to his/her office.  In some cases, they will be blood work, cultures and biopsy results pending at the time of your discharge. Please request that your primary care M.D. follows up on these results.  Also Note the following: If you experience worsening of your admission symptoms, develop shortness of breath, life threatening emergency, suicidal or homicidal thoughts you must seek medical attention immediately by calling 911 or calling your MD immediately  if symptoms less severe.  You must read complete instructions/literature along with all the possible adverse reactions/side effects for all the Medicines you take and that have been prescribed to you. Take any new Medicines after you have completely understood and accpet all the possible adverse reactions/side effects.   Do not drive when taking Pain medications or sleeping medications (Benzodaizepines)  Do not take more than prescribed Pain, Sleep and Anxiety Medications. It is not advisable to combine anxiety,sleep and pain medications without talking with your primary care practitioner  Special Instructions: If you have smoked or chewed  Tobacco  in the last 2 yrs please stop smoking, stop any regular Alcohol  and or any Recreational drug use.  Wear Seat belts while driving.  Please note: You were cared for by a hospitalist during your hospital stay. Once you are discharged, your primary care physician will handle any further medical issues. Please note that NO REFILLS for any discharge medications will be authorized once you are discharged, as it is imperative that you return to your primary care physician (or establish a relationship with a primary care physician if you do not have one) for your post hospital discharge needs so that they can reassess your need for medications and monitor your lab values.

## 2018-03-23 NOTE — Discharge Summary (Addendum)
Physician Discharge Summary  [name] [name] SWF:[id number] DOB: 1929-10-04 DOA: 03/21/2018  PCP: Caryl Bis, MD  Admit date: 03/21/2018 Discharge date: 03/23/2018  Admitted From: Home  Disposition: Home with home health  Recommendations for Outpatient Follow-up:  1. Follow up with PCP in 1 weeks 2. Follow up with cardiology in 2 weeks 3. Please obtain BMP/CBC in one week 4. Home health nurse to check PT/INR and forward results to PCP/cardiologist 5. Stop lovenox when INR>2.0.    Home Health: RN to check PT/INR on 8/7, 8/9 and report results to PCP and cardiology  Discharge Condition: STABLE   CODE STATUS: FULL    Brief Hospitalization Summary: Please see all hospital notes, images, labs for full details of the hospitalization. Brief Admission Hx: [name] [name] a 84 y.o.femalewith medical history significant of chronic atrial fibrillation on warfarin, nonobstructive CAD, depression, dysphagia, essential hypertension, hyperlipidemia, polymyalgia rheumatica, history of stroke, multiple shoulders surgeries who is coming to the emergency department due to shortness of breath associated with palpitations since early in the morning.  She was admitted with acute diastolic CHF exacerbation.  Her AFib RVR rapidly improved with IV metoprolol.   MDM/Assessment & Plan:   1. Atrial Fibrillation with RVR - Her rate rapidly improved with IV metoprolol.  Her rate is now controlled.  She has been restarted on home metoprolol and cardizem. Continue warfarin for anticoagulation. Her INR is 1.20.  She will discharge on lovenox injections with home health RN ordered to check INR in 2 days and report results to PCP and cardiologist.  Pt to stop lovenox when INR>2.0.  Pt counseled to watch for any bleeding complications.   2. Acute diastolic CHF exacerbation - IV lasix has been ordered and patient has been resumed on her home lasix starting 8/5.  She is already diuresing and feeling better.  Follow.   Cardiology follow up in 2 weeks recommended.   3. Hyperlipidemia- resumed home atorvastatin QHS.  4. Essential hypertesion - controlled on home medication regimen which has been continued.  5. Polymyalgia rheumatica - resumed on home prednisone 10 mg daily.  6. CAD - stable, no symptoms, resume home medications.  7. Depression - resume citalopram 20 mg daily.   DVT prophylaxis: warfarin  Code Status: Full  Family Communication: at bedside Disposition Plan: home with home health services  Echocardiogram 03/22/18 Study Conclusions  - Left ventricle: The cavity size was normal. Wall thickness was normal. Systolic function was normal. The estimated ejection fraction was in the range of 55% to 60%. Wall motion was normal; there were no regional wall motion abnormalities. The study was not technically sufficient to allow evaluation of LV diastolic dysfunction due to atrial fibrillation. - Aortic valve: Mildly to moderately calcified annulus. Mildly calcified leaflets. There was trivial regurgitation.  Morphologically, there appears to be at least moderate calcific aortic stenosis. - Mitral valve: Calcified annulus. There was moderateregurgitation. - Left atrium: The atrium was severely dilated. - Right ventricle: Systolic function was mildly reduced. - Right atrium: The atrium was mildly dilated. - Atrial septum: No defect or patent foramen ovale was identified. - Tricuspid valve: There was mild-moderate regurgitation. - Pulmonary arteries: PA peak pressure: 39 mm Hg (S).  Discharge Diagnoses:  Principal Problem:   Atrial fibrillation with rapid ventricular response (HCC) Active Problems:   Hyperlipidemia   Essential hypertension, benign   Polymyalgia rheumatica (HCC)   Coronary atherosclerosis of native coronary artery   Acute diastolic CHF (congestive heart failure) ([place])   Depression  Discharge Instructions: Discharge Instructions    (HEART FAILURE PATIENTS)  Call MD:  Anytime you have any of the following symptoms: 1) 3 pound weight gain in 24 hours or 5 pounds in 1 week 2) shortness of breath, with or without a dry hacking cough 3) swelling in the hands, feet or stomach 4) if you have to sleep on extra pillows at night in order to breathe.   Complete by:  As directed    Call MD for:  difficulty breathing, headache or visual disturbances   Complete by:  As directed    Call MD for:  extreme fatigue   Complete by:  As directed    Call MD for:  persistant dizziness or light-headedness   Complete by:  As directed    Call MD for:  persistant nausea and vomiting   Complete by:  As directed    Call MD for:  severe uncontrolled pain   Complete by:  As directed    Diet - low sodium heart healthy   Complete by:  As directed    Increase activity slowly   Complete by:  As directed      Allergies as of 03/23/2018      Reactions   Sulfonamide Derivatives Rash      Medication List    STOP taking these medications   atenolol 50 MG tablet Commonly known as:  TENORMIN     TAKE these medications   acetaminophen 500 MG tablet Commonly known as:  TYLENOL Take 500-1,000 mg by mouth daily as needed for moderate pain.   atorvastatin 40 MG tablet Commonly known as:  LIPITOR Take 40 mg by mouth at bedtime.   Biotin 1000 MCG tablet Take 1,000 mcg by mouth daily.   CALTRATE 600+D 600-400 MG-UNIT tablet Generic drug:  Calcium Carbonate-Vitamin D Take 1 tablet by mouth daily.   citalopram 20 MG tablet Commonly known as:  CELEXA Take 20 mg by mouth daily.   dicyclomine 20 MG tablet Commonly known as:  BENTYL TAKE 1 TABLET BY MOUTH TWO TIMES DAILY FOR BOWEL PROBLEMS   diltiazem 120 MG tablet Commonly known as:  CARDIZEM Take 120 mg by mouth 2 (two) times daily.   doxycycline 100 MG tablet Commonly known as:  VIBRA-TABS Take 100 mg by mouth 2 (two) times daily. 10 day course starting on 03/17/2018   enoxaparin 80 MG/0.8ML injection Commonly  known as:  LOVENOX Inject 0.7 mLs (70 mg total) into the skin every 12 (twelve) hours for 5 days.   furosemide 40 MG tablet Commonly known as:  LASIX Take 40 mg by mouth daily.   losartan 50 MG tablet Commonly known as:  COZAAR Take 50 mg by mouth every evening.   metoprolol succinate 25 MG 24 hr tablet Commonly known as:  TOPROL-XL Take 3 tablets (75 mg total) by mouth at bedtime. What changed:  medication strength   potassium chloride SA 20 MEQ tablet Commonly known as:  K-DUR,KLOR-CON TAKE ONE TABLET BY MOUTH EVERY DAY FOR LOW POTASSIUM   predniSONE 5 MG tablet Commonly known as:  DELTASONE Take 10 mg by mouth daily.   ranitidine 150 MG tablet Commonly known as:  ZANTAC Take 150 mg by mouth 2 (two) times daily.   warfarin 4 MG tablet Commonly known as:  COUMADIN Take 2-4 mg by mouth See admin instructions. Patient takes 4mg  on one evening, then takes 2mg  for two evenings, then then takes 4mg , etc. (4mg , 2mg , 2mg , 4mg , etc)  Follow-up Information    Satira Sark, MD. Schedule an appointment as soon as possible for a visit in 2 week(s).   Specialty:  Cardiology Why:  Hospital Follow Up  Contact information: [place] [address] [phone]        Caryl Bis, MD. Schedule an appointment as soon as possible for a visit in 1 week(s).   Specialty:  Family Medicine Why:  Hospital Follow Up  Contact information: [place] [address] [phone]          Allergies  Allergen Reactions  . Sulfonamide Derivatives Rash   Allergies as of 03/23/2018      Reactions   Sulfonamide Derivatives Rash      Medication List    STOP taking these medications   atenolol 50 MG tablet Commonly known as:  TENORMIN     TAKE these medications   acetaminophen 500 MG tablet Commonly known as:  TYLENOL Take 500-1,000 mg by mouth daily as needed for moderate pain.   atorvastatin 40 MG tablet Commonly known as:  LIPITOR Take  40 mg by mouth at bedtime.   Biotin 1000 MCG tablet Take 1,000 mcg by mouth daily.   CALTRATE 600+D 600-400 MG-UNIT tablet Generic drug:  Calcium Carbonate-Vitamin D Take 1 tablet by mouth daily.   citalopram 20 MG tablet Commonly known as:  CELEXA Take 20 mg by mouth daily.   dicyclomine 20 MG tablet Commonly known as:  BENTYL TAKE 1 TABLET BY MOUTH TWO TIMES DAILY FOR BOWEL PROBLEMS   diltiazem 120 MG tablet Commonly known as:  CARDIZEM Take 120 mg by mouth 2 (two) times daily.   doxycycline 100 MG tablet Commonly known as:  VIBRA-TABS Take 100 mg by mouth 2 (two) times daily. 10 day course starting on 03/17/2018   enoxaparin 80 MG/0.8ML injection Commonly known as:  LOVENOX Inject 0.7 mLs (70 mg total) into the skin every 12 (twelve) hours for 5 days.   furosemide 40 MG tablet Commonly known as:  LASIX Take 40 mg by mouth daily.   losartan 50 MG tablet Commonly known as:  COZAAR Take 50 mg by mouth every evening.   metoprolol succinate 25 MG 24 hr tablet Commonly known as:  TOPROL-XL Take 3 tablets (75 mg total) by mouth at bedtime. What changed:  medication strength   potassium chloride SA 20 MEQ tablet Commonly known as:  K-DUR,KLOR-CON TAKE ONE TABLET BY MOUTH EVERY DAY FOR LOW POTASSIUM   predniSONE 5 MG tablet Commonly known as:  DELTASONE Take 10 mg by mouth daily.   ranitidine 150 MG tablet Commonly known as:  ZANTAC Take 150 mg by mouth 2 (two) times daily.   warfarin 4 MG tablet Commonly known as:  COUMADIN Take 2-4 mg by mouth See admin instructions. Patient takes 4mg  on one evening, then takes 2mg  for two evenings, then then takes 4mg , etc. (4mg , 2mg , 2mg , 4mg , etc)       Procedures/Studies: Dg Chest 2 View  Result Date: 03/21/2018 CLINICAL DATA:  Dyspnea since this morning. EXAM: CHEST - 2 VIEW COMPARISON:  01/21/2018 FINDINGS: Stable cardiomegaly with atherosclerosis of the thoracic aorta. Mild interstitial prominence with blunting the  left costophrenic angle may reflect mild interstitial edema with trace left effusion. Small focus of atelectasis at the right lung base is suspected. Tiny focus of pneumonia is not entirely excluded. No acute nor suspicious osseous abnormality. IMPRESSION: 1. Stable cardiomegaly with aortic atherosclerosis. Mild interstitial edema  with trace left effusion is suggested. 2. Small focus of pulmonary opacity at the right lung base likely reflects atelectasis and/or superimposition of ribs and pulmonary vasculature. Small focus of pneumonia is not entirely excluded. Electronically Signed   By: Ashley Royalty M.D.   On: 03/21/2018 20:23   Ct Angio Chest Pe W/cm &/or Wo Cm  Result Date: 03/21/2018 CLINICAL DATA:  Dyspnea since this morning without chest pain. EXAM: CT ANGIOGRAPHY CHEST WITH CONTRAST TECHNIQUE: Multidetector CT imaging of the chest was performed using the standard protocol during bolus administration of intravenous contrast. Multiplanar CT image reconstructions and MIPs were obtained to evaluate the vascular anatomy. CONTRAST:  142mL ISOVUE-370 IOPAMIDOL (ISOVUE-370) INJECTION 76% COMPARISON:  03/21/2018 and 01/21/2018 CXR, chest CT 06/01/2016 FINDINGS: Cardiovascular: Satisfactory opacification of the pulmonary arteries to the segmental level. No evidence of pulmonary embolism. Cardiomegaly without pericardial effusion. Minimal coronary arteriosclerosis along the left circumflex. Nonaneurysmal atherosclerotic aorta is identified. Mediastinum/Nodes: Mild retroclavicular extension of the thyroid gland without dominant mass seen. Patent midline trachea and mainstem bronchi. No axillary, mediastinal or hilar lymphadenopathy. Unremarkable CT appearance of the esophagus. Lungs/Pleura: Biapical pleuroparenchymal scarring. No pulmonary edema, effusion or pneumothorax. 7 mm nodular densities are noted in the left lower lobe medially. Subsegmental scarring is also noted at the base. Upper Abdomen: Stable left  hepatic 9 mm hypodensity dating back to 2017 consistent with a benign finding, statistically more likely to represent a cyst or hemangioma. Smaller hypodensity in the right hepatic lobe measuring 6 mm. No biliary dilatation. No splenomegaly. Musculoskeletal: No chest wall abnormality. Mild inferior endplate compression of T8 unchanged relative to 01/21/2018 chest radiograph. Review of the MIP images confirms the above findings. IMPRESSION: 1. Cardiomegaly with minimal coronary arteriosclerosis and aortic atherosclerosis. 2. No acute pulmonary disease. 3. No acute pulmonary embolus. 4. 7 mm nodular opacities in medial left lower lobe. Non-contrast chest CT at 3-6 months is recommended. If the nodules are stable at time of repeat CT, then future CT at 18-24 months (from today's scan) is considered optional for low-risk patients, but is recommended for high-risk patients. This recommendation follows the consensus statement: Guidelines for Management of Incidental Pulmonary Nodules Detected on CT Images: From the Fleischner Society 2017; Radiology 2017; 284:228-243. Electronically Signed   By: Ashley Royalty M.D.   On: 03/21/2018 22:28     Subjective: Pt says that she is feeling a lot better today.  She is no longer short of breath and says that she has been ambulating to bathroom with no difficulties.  No chest pain.  She really wants to go home today.  Says she will follow up with Dr. Domenic Polite.    Discharge Exam: Vitals:   03/22/18 2234 03/23/18 0601  BP: (!) 152/69 (!) 122/58  Pulse: 68 70  Resp: 17 16  Temp: 97.8 F (36.6 C) (!) 97.4 F (36.3 C)  SpO2: 94% 96%   Vitals:   03/22/18 1657 03/22/18 2133 03/22/18 2234 03/23/18 0601  BP: (!) 150/77 134/65 (!) 152/69 (!) 122/58  Pulse: 86 69 68 70  Resp:  16 17 16   Temp: 98.6 F (37 C) 98.4 F (36.9 C) 97.8 F (36.6 C) (!) 97.4 F (36.3 C)  TempSrc: Oral Oral Oral Oral  SpO2: 97% 98% 94% 96%  Weight:    71.2 kg (156 lb 15.5 oz)  Height:        General: Pt is alert, awake, not in acute distress Cardiovascular: normal S1/S2 +, no rubs, no gallops Respiratory: CTA bilaterally, no  wheezing, no rhonchi Abdominal: Soft, NT, ND, bowel sounds + Extremities: no edema, no cyanosis   The results of significant diagnostics from this hospitalization (including imaging, microbiology, ancillary and laboratory) are listed below for reference.     Microbiology: No results found for this or any previous visit (from the past 240 hour(s)).   Labs: BNP (last 3 results) Recent Labs    01/21/18 1554 03/21/18 2000  BNP 440.0* 836.6*   Basic Metabolic Panel: Recent Labs  Lab 03/20/18 1602 03/21/18 2000 03/23/18 0617  NA 135 137 139  K 3.2* 3.7 4.0  CL 104 105 108  CO2 19* 21* 24  GLUCOSE 157* 114* 103*  BUN 12 17 17   CREATININE 1.18* 0.93 1.03*  CALCIUM 8.5* 9.0 8.7*  MG  --  1.8 2.2  PHOS  --  2.6  --    Liver Function Tests: Recent Labs  Lab 03/20/18 1602 03/21/18 2000  AST 23 23  ALT 13 15  ALKPHOS 64 64  BILITOT 0.8 0.7  PROT 6.7 7.0  ALBUMIN 3.3* 3.5   No results for input(s): LIPASE, AMYLASE in the last 168 hours. No results for input(s): AMMONIA in the last 168 hours. CBC: Recent Labs  Lab 03/20/18 1602 03/21/18 2000 03/23/18 0617  WBC 10.5 7.7 7.3  NEUTROABS 9.1*  --   --   HGB 11.9* 11.9* 11.3*  HCT 35.9* 35.3* 34.6*  MCV 90.9 90.1 92.3  PLT 284 314 308   Cardiac Enzymes: Recent Labs  Lab 03/21/18 2000  TROPONINI <0.03   BNP: Invalid input(s): POCBNP CBG: No results for input(s): GLUCAP in the last 168 hours. D-Dimer No results for input(s): DDIMER in the last 72 hours. Hgb A1c No results for input(s): HGBA1C in the last 72 hours. Lipid Profile No results for input(s): CHOL, HDL, LDLCALC, TRIG, CHOLHDL, LDLDIRECT in the last 72 hours. Thyroid function studies No results for input(s): TSH, T4TOTAL, T3FREE, THYROIDAB in the last 72 hours.  Invalid input(s): FREET3 Anemia work up No  results for input(s): VITAMINB12, FOLATE, FERRITIN, TIBC, IRON, RETICCTPCT in the last 72 hours. Urinalysis    Component Value Date/Time   COLORURINE STRAW (A) 01/21/2018 1724   APPEARANCEUR CLEAR 01/21/2018 1724   APPEARANCEUR Clear 10/28/2017 1422   LABSPEC 1.005 01/21/2018 1724   PHURINE 7.0 01/21/2018 1724   GLUCOSEU NEGATIVE 01/21/2018 1724   HGBUR NEGATIVE 01/21/2018 1724   BILIRUBINUR NEGATIVE 01/21/2018 1724   BILIRUBINUR Negative 10/28/2017 1422   KETONESUR NEGATIVE 01/21/2018 1724   PROTEINUR NEGATIVE 01/21/2018 1724   UROBILINOGEN 0.2 12/23/2011 2034   NITRITE NEGATIVE 01/21/2018 1724   LEUKOCYTESUR NEGATIVE 01/21/2018 1724   LEUKOCYTESUR Negative 10/28/2017 1422   Sepsis Labs Invalid input(s): PROCALCITONIN,  WBC,  LACTICIDVEN Microbiology No results found for this or any previous visit (from the past 240 hour(s)).  Time coordinating discharge:   SIGNED:  Irwin Brakeman, MD  [facility] 03/23/2018, 12:02 PM Pager [phone]  If 7PM-7AM, please contact night-coverage [email or web address] Password [email or web address]

## 2018-03-23 NOTE — Progress Notes (Signed)
Per tele, patient had 3 second pause. Patient asleep. This RN text paged Wynetta Emery, MD. Will pass on to dayshift RN.

## 2018-03-23 NOTE — Care Management Note (Signed)
Case Management Note  Patient Details  Name: [name] [name] MRN: [id number] Date of Birth: 01-09-1930      Admitted with a-fib with RVR. Pt on coumadin pta. Will DC with lovenox bridge. Pt says she is comfortable with doing lovenox injections herself. Pt has appointment with PCP Thursday. [place] ordered for INR checks on Wednesday and Friday. Pt would prefer not to have [place] and do INR checks at PCP office. Pt unable to have appointment moved to Wednesday. CM has clarified with attending it is okay for INR to be checked in PCP office on Thursday and not in home Wednesday. Plan confirmed with pt.                 Expected Discharge Date:  03/23/18               Expected Discharge Plan:  Home/Self Care  In-House Referral:  NA  Discharge planning Services  CM Consult  Post Acute Care Choice:  NA Choice offered to:  NA  Status of Service:  Completed, signed off  If discussed at Long Length of Stay Meetings, dates discussed:    Additional Comments:  Sherald Barge, RN 03/23/2018, 2:15 PM

## 2018-03-23 NOTE — Care Management Obs Status (Signed)
[place] NOTIFICATION   Patient Details  Name: [name] [name] MRN: [id number] Date of Birth: 29-Oct-1929   Medicare Observation Status Notification Given:  Yes    Sherald Barge, RN 03/23/2018, 8:47 AM

## 2018-03-26 DIAGNOSIS — I482 Chronic atrial fibrillation: Secondary | ICD-10-CM | POA: Diagnosis not present

## 2018-03-26 DIAGNOSIS — I5032 Chronic diastolic (congestive) heart failure: Secondary | ICD-10-CM | POA: Diagnosis not present

## 2018-03-26 DIAGNOSIS — Z6825 Body mass index (BMI) 25.0-25.9, adult: Secondary | ICD-10-CM | POA: Diagnosis not present

## 2018-03-28 DIAGNOSIS — I509 Heart failure, unspecified: Secondary | ICD-10-CM | POA: Diagnosis not present

## 2018-03-28 DIAGNOSIS — Z8673 Personal history of transient ischemic attack (TIA), and cerebral infarction without residual deficits: Secondary | ICD-10-CM | POA: Diagnosis not present

## 2018-03-28 DIAGNOSIS — E7800 Pure hypercholesterolemia, unspecified: Secondary | ICD-10-CM | POA: Diagnosis not present

## 2018-03-28 DIAGNOSIS — I110 Hypertensive heart disease with heart failure: Secondary | ICD-10-CM | POA: Diagnosis not present

## 2018-03-28 DIAGNOSIS — F419 Anxiety disorder, unspecified: Secondary | ICD-10-CM | POA: Diagnosis not present

## 2018-03-28 DIAGNOSIS — Z79899 Other long term (current) drug therapy: Secondary | ICD-10-CM | POA: Diagnosis not present

## 2018-03-28 DIAGNOSIS — Z7901 Long term (current) use of anticoagulants: Secondary | ICD-10-CM | POA: Diagnosis not present

## 2018-03-28 DIAGNOSIS — R0602 Shortness of breath: Secondary | ICD-10-CM | POA: Diagnosis not present

## 2018-03-28 DIAGNOSIS — I4891 Unspecified atrial fibrillation: Secondary | ICD-10-CM | POA: Diagnosis not present

## 2018-03-30 DIAGNOSIS — I482 Chronic atrial fibrillation: Secondary | ICD-10-CM | POA: Diagnosis not present

## 2018-03-31 DIAGNOSIS — L03031 Cellulitis of right toe: Secondary | ICD-10-CM | POA: Diagnosis not present

## 2018-03-31 DIAGNOSIS — L97512 Non-pressure chronic ulcer of other part of right foot with fat layer exposed: Secondary | ICD-10-CM | POA: Diagnosis not present

## 2018-04-09 DIAGNOSIS — I482 Chronic atrial fibrillation: Secondary | ICD-10-CM | POA: Diagnosis not present

## 2018-04-14 DIAGNOSIS — L97512 Non-pressure chronic ulcer of other part of right foot with fat layer exposed: Secondary | ICD-10-CM | POA: Diagnosis not present

## 2018-04-22 DIAGNOSIS — Z6825 Body mass index (BMI) 25.0-25.9, adult: Secondary | ICD-10-CM | POA: Diagnosis not present

## 2018-04-22 DIAGNOSIS — I482 Chronic atrial fibrillation: Secondary | ICD-10-CM | POA: Diagnosis not present

## 2018-04-22 DIAGNOSIS — I5033 Acute on chronic diastolic (congestive) heart failure: Secondary | ICD-10-CM | POA: Diagnosis not present

## 2018-04-30 DIAGNOSIS — I482 Chronic atrial fibrillation: Secondary | ICD-10-CM | POA: Diagnosis not present

## 2018-05-05 DIAGNOSIS — L97511 Non-pressure chronic ulcer of other part of right foot limited to breakdown of skin: Secondary | ICD-10-CM | POA: Diagnosis not present

## 2018-05-25 DIAGNOSIS — D519 Vitamin B12 deficiency anemia, unspecified: Secondary | ICD-10-CM | POA: Diagnosis not present

## 2018-05-25 DIAGNOSIS — I10 Essential (primary) hypertension: Secondary | ICD-10-CM | POA: Diagnosis not present

## 2018-05-25 DIAGNOSIS — R5382 Chronic fatigue, unspecified: Secondary | ICD-10-CM | POA: Diagnosis not present

## 2018-05-25 DIAGNOSIS — R7301 Impaired fasting glucose: Secondary | ICD-10-CM | POA: Diagnosis not present

## 2018-05-25 DIAGNOSIS — E8881 Metabolic syndrome: Secondary | ICD-10-CM | POA: Diagnosis not present

## 2018-05-25 DIAGNOSIS — I350 Nonrheumatic aortic (valve) stenosis: Secondary | ICD-10-CM | POA: Diagnosis not present

## 2018-05-25 DIAGNOSIS — I5032 Chronic diastolic (congestive) heart failure: Secondary | ICD-10-CM | POA: Diagnosis not present

## 2018-05-25 DIAGNOSIS — K210 Gastro-esophageal reflux disease with esophagitis: Secondary | ICD-10-CM | POA: Diagnosis not present

## 2018-05-25 DIAGNOSIS — Z23 Encounter for immunization: Secondary | ICD-10-CM | POA: Diagnosis not present

## 2018-05-25 DIAGNOSIS — Z9189 Other specified personal risk factors, not elsewhere classified: Secondary | ICD-10-CM | POA: Diagnosis not present

## 2018-05-26 DIAGNOSIS — L97511 Non-pressure chronic ulcer of other part of right foot limited to breakdown of skin: Secondary | ICD-10-CM | POA: Diagnosis not present

## 2018-06-22 DIAGNOSIS — I482 Chronic atrial fibrillation, unspecified: Secondary | ICD-10-CM | POA: Diagnosis not present

## 2018-06-22 DIAGNOSIS — R252 Cramp and spasm: Secondary | ICD-10-CM | POA: Diagnosis not present

## 2018-06-22 DIAGNOSIS — Z882 Allergy status to sulfonamides status: Secondary | ICD-10-CM | POA: Diagnosis not present

## 2018-06-22 DIAGNOSIS — F3340 Major depressive disorder, recurrent, in remission, unspecified: Secondary | ICD-10-CM | POA: Diagnosis not present

## 2018-06-22 DIAGNOSIS — R0602 Shortness of breath: Secondary | ICD-10-CM | POA: Diagnosis not present

## 2018-06-22 DIAGNOSIS — E785 Hyperlipidemia, unspecified: Secondary | ICD-10-CM | POA: Diagnosis not present

## 2018-06-22 DIAGNOSIS — I110 Hypertensive heart disease with heart failure: Secondary | ICD-10-CM | POA: Diagnosis not present

## 2018-06-22 DIAGNOSIS — Z7901 Long term (current) use of anticoagulants: Secondary | ICD-10-CM | POA: Diagnosis not present

## 2018-06-22 DIAGNOSIS — Z90710 Acquired absence of both cervix and uterus: Secondary | ICD-10-CM | POA: Diagnosis not present

## 2018-06-22 DIAGNOSIS — R0789 Other chest pain: Secondary | ICD-10-CM | POA: Diagnosis not present

## 2018-06-22 DIAGNOSIS — Z818 Family history of other mental and behavioral disorders: Secondary | ICD-10-CM | POA: Diagnosis not present

## 2018-06-22 DIAGNOSIS — Z8249 Family history of ischemic heart disease and other diseases of the circulatory system: Secondary | ICD-10-CM | POA: Diagnosis not present

## 2018-06-22 DIAGNOSIS — Z8673 Personal history of transient ischemic attack (TIA), and cerebral infarction without residual deficits: Secondary | ICD-10-CM | POA: Diagnosis not present

## 2018-06-22 DIAGNOSIS — Z79899 Other long term (current) drug therapy: Secondary | ICD-10-CM | POA: Diagnosis not present

## 2018-06-22 DIAGNOSIS — I5033 Acute on chronic diastolic (congestive) heart failure: Secondary | ICD-10-CM | POA: Diagnosis not present

## 2018-06-22 DIAGNOSIS — I350 Nonrheumatic aortic (valve) stenosis: Secondary | ICD-10-CM | POA: Diagnosis not present

## 2018-06-22 DIAGNOSIS — R079 Chest pain, unspecified: Secondary | ICD-10-CM | POA: Diagnosis not present

## 2018-06-22 DIAGNOSIS — Z86718 Personal history of other venous thrombosis and embolism: Secondary | ICD-10-CM | POA: Diagnosis not present

## 2018-06-22 DIAGNOSIS — H9190 Unspecified hearing loss, unspecified ear: Secondary | ICD-10-CM | POA: Diagnosis not present

## 2018-06-22 DIAGNOSIS — Z7952 Long term (current) use of systemic steroids: Secondary | ICD-10-CM | POA: Diagnosis not present

## 2018-06-22 DIAGNOSIS — Z89421 Acquired absence of other right toe(s): Secondary | ICD-10-CM | POA: Diagnosis not present

## 2018-06-22 DIAGNOSIS — K580 Irritable bowel syndrome with diarrhea: Secondary | ICD-10-CM | POA: Diagnosis not present

## 2018-06-22 DIAGNOSIS — I200 Unstable angina: Secondary | ICD-10-CM | POA: Diagnosis not present

## 2018-06-22 DIAGNOSIS — Z888 Allergy status to other drugs, medicaments and biological substances status: Secondary | ICD-10-CM | POA: Diagnosis not present

## 2018-06-22 DIAGNOSIS — M353 Polymyalgia rheumatica: Secondary | ICD-10-CM | POA: Diagnosis not present

## 2018-06-22 DIAGNOSIS — K219 Gastro-esophageal reflux disease without esophagitis: Secondary | ICD-10-CM | POA: Diagnosis not present

## 2018-07-07 DIAGNOSIS — L97511 Non-pressure chronic ulcer of other part of right foot limited to breakdown of skin: Secondary | ICD-10-CM | POA: Diagnosis not present

## 2018-07-14 DIAGNOSIS — K210 Gastro-esophageal reflux disease with esophagitis: Secondary | ICD-10-CM | POA: Diagnosis not present

## 2018-07-14 DIAGNOSIS — E8881 Metabolic syndrome: Secondary | ICD-10-CM | POA: Diagnosis not present

## 2018-07-14 DIAGNOSIS — R5382 Chronic fatigue, unspecified: Secondary | ICD-10-CM | POA: Diagnosis not present

## 2018-07-14 DIAGNOSIS — R7301 Impaired fasting glucose: Secondary | ICD-10-CM | POA: Diagnosis not present

## 2018-07-14 DIAGNOSIS — I10 Essential (primary) hypertension: Secondary | ICD-10-CM | POA: Diagnosis not present

## 2018-07-14 DIAGNOSIS — D519 Vitamin B12 deficiency anemia, unspecified: Secondary | ICD-10-CM | POA: Diagnosis not present

## 2018-07-14 DIAGNOSIS — E782 Mixed hyperlipidemia: Secondary | ICD-10-CM | POA: Diagnosis not present

## 2018-07-14 DIAGNOSIS — R42 Dizziness and giddiness: Secondary | ICD-10-CM | POA: Diagnosis not present

## 2018-07-17 DIAGNOSIS — I10 Essential (primary) hypertension: Secondary | ICD-10-CM | POA: Diagnosis not present

## 2018-07-17 DIAGNOSIS — I5032 Chronic diastolic (congestive) heart failure: Secondary | ICD-10-CM | POA: Diagnosis not present

## 2018-07-17 DIAGNOSIS — F331 Major depressive disorder, recurrent, moderate: Secondary | ICD-10-CM | POA: Diagnosis not present

## 2018-07-17 DIAGNOSIS — M1712 Unilateral primary osteoarthritis, left knee: Secondary | ICD-10-CM | POA: Diagnosis not present

## 2018-07-17 DIAGNOSIS — E8881 Metabolic syndrome: Secondary | ICD-10-CM | POA: Diagnosis not present

## 2018-07-17 DIAGNOSIS — E782 Mixed hyperlipidemia: Secondary | ICD-10-CM | POA: Diagnosis not present

## 2018-07-17 DIAGNOSIS — Z6826 Body mass index (BMI) 26.0-26.9, adult: Secondary | ICD-10-CM | POA: Diagnosis not present

## 2018-07-17 DIAGNOSIS — I4891 Unspecified atrial fibrillation: Secondary | ICD-10-CM | POA: Diagnosis not present

## 2018-07-20 DIAGNOSIS — I350 Nonrheumatic aortic (valve) stenosis: Secondary | ICD-10-CM | POA: Diagnosis not present

## 2018-07-22 DIAGNOSIS — I350 Nonrheumatic aortic (valve) stenosis: Secondary | ICD-10-CM | POA: Diagnosis not present

## 2018-07-23 DIAGNOSIS — D692 Other nonthrombocytopenic purpura: Secondary | ICD-10-CM | POA: Diagnosis not present

## 2018-07-23 DIAGNOSIS — Z6825 Body mass index (BMI) 25.0-25.9, adult: Secondary | ICD-10-CM | POA: Diagnosis not present

## 2018-07-23 DIAGNOSIS — H109 Unspecified conjunctivitis: Secondary | ICD-10-CM | POA: Diagnosis not present

## 2018-07-23 DIAGNOSIS — I5033 Acute on chronic diastolic (congestive) heart failure: Secondary | ICD-10-CM | POA: Diagnosis not present

## 2018-07-28 DIAGNOSIS — I350 Nonrheumatic aortic (valve) stenosis: Secondary | ICD-10-CM | POA: Diagnosis not present

## 2018-07-28 DIAGNOSIS — Z6826 Body mass index (BMI) 26.0-26.9, adult: Secondary | ICD-10-CM | POA: Diagnosis not present

## 2018-07-28 DIAGNOSIS — R300 Dysuria: Secondary | ICD-10-CM | POA: Diagnosis not present

## 2018-07-28 DIAGNOSIS — J329 Chronic sinusitis, unspecified: Secondary | ICD-10-CM | POA: Diagnosis not present

## 2018-08-05 ENCOUNTER — Ambulatory Visit: Payer: Self-pay | Admitting: Adult Health

## 2018-08-05 ENCOUNTER — Ambulatory Visit (INDEPENDENT_AMBULATORY_CARE_PROVIDER_SITE_OTHER): Payer: Medicare Other | Admitting: Cardiology

## 2018-08-05 ENCOUNTER — Encounter: Payer: Self-pay | Admitting: Cardiology

## 2018-08-05 VITALS — BP 129/65 | HR 81 | Ht 66.5 in | Wt 159.0 lb

## 2018-08-05 DIAGNOSIS — I4821 Permanent atrial fibrillation: Secondary | ICD-10-CM | POA: Diagnosis not present

## 2018-08-05 DIAGNOSIS — I10 Essential (primary) hypertension: Secondary | ICD-10-CM | POA: Diagnosis not present

## 2018-08-05 DIAGNOSIS — I350 Nonrheumatic aortic (valve) stenosis: Secondary | ICD-10-CM

## 2018-08-05 DIAGNOSIS — I5032 Chronic diastolic (congestive) heart failure: Secondary | ICD-10-CM | POA: Diagnosis not present

## 2018-08-05 NOTE — Progress Notes (Signed)
Cardiology Office Note  Date: 08/05/2018   ID: [name] [name], DOB Nov 14, 1929, MRN [id number]  PCP: Caryl Bis, MD  Primary Cardiologist: Rozann Lesches, MD   Chief Complaint  Patient presents with  . Cardiac follow-up    History of Present Illness: [name] [name] is an 84 y.o. female last seen in November 2018.  She presents overdue for follow-up. Records indicate follow-up at [facility] in November after hospitalization with apparent fluid overload and chest pain, negative troponin T levels.  Her diuretics were increased.  She states that she has done better since that time, reports NYHA class II dyspnea, no palpitations.  She has been under a lot of stress, her husband has gone through significant health changes over the last year.  She remains on Coumadin with follow-up per Dr. Quillian Quince.  She does not report any bleeding problems.  Echocardiogram from August of this year revealed LVEF 55 to 60% without wall motion abnormalities, at least moderate calcific aortic stenosis, moderate mitral regurgitation.  Past Medical History:  Diagnosis Date  . Atrial fibrillation ([place])    On Coumadin  . Coronary atherosclerosis of native coronary artery    Nonobstructive at cardiac catheterization 2004  . Depression   . Dysphagia   . Essential hypertension, benign   . Hyperlipidemia   . Polymyalgia rheumatica ([place])   . Stroke [facility])     Past Surgical History:  Procedure Laterality Date  . APPENDECTOMY    . COLONOSCOPY  2008   Dr.Fleshiman  . Left ankle    . TOE SURGERY    . TOTAL ABDOMINAL HYSTERECTOMY      Current Outpatient Medications  Medication Sig Dispense Refill  . acetaminophen (TYLENOL) 500 MG tablet Take 500-1,000 mg by mouth daily as needed for moderate pain.     Marland Kitchen atorvastatin (LIPITOR) 40 MG tablet Take 40 mg by mouth at bedtime.     . Biotin 1000 MCG tablet Take 1,000 mcg by mouth daily.    . Calcium Carbonate-Vitamin D (CALTRATE 600+D) 600-400 MG-UNIT per  tablet Take 1 tablet by mouth daily.      . citalopram (CELEXA) 20 MG tablet Take 20 mg by mouth daily.    Marland Kitchen diltiazem (CARDIZEM) 120 MG tablet Take 120 mg by mouth 2 (two) times daily.    . furosemide (LASIX) 40 MG tablet Take 40 mg by mouth daily.     Marland Kitchen losartan (COZAAR) 50 MG tablet Take 50 mg by mouth every evening.     . metoprolol succinate (TOPROL-XL) 25 MG 24 hr tablet Take 3 tablets (75 mg total) by mouth at bedtime. 90 tablet 0  . potassium chloride SA (K-DUR,KLOR-CON) 20 MEQ tablet TAKE ONE TABLET BY MOUTH EVERY DAY FOR LOW POTASSIUM  6  . predniSONE (DELTASONE) 5 MG tablet Take 10 mg by mouth daily.     . ranitidine (ZANTAC) 150 MG tablet Take 150 mg by mouth 2 (two) times daily.  1  . warfarin (COUMADIN) 4 MG tablet Take 2-4 mg by mouth See admin instructions. Patient takes 4mg  on one evening, then takes 2mg  for two evenings, then then takes 4mg , etc. (4mg , 2mg , 2mg , 4mg , etc) DAYSPRING MANAGING     No current facility-administered medications for this visit.    Allergies:  Sulfonamide derivatives   Social History: The patient  reports that she has never smoked. She has never used smokeless tobacco. She reports that she does not drink alcohol or use drugs.   ROS:  Please  see the history of present illness. Otherwise, complete review of systems is positive for hearing loss, sinus trouble.  All other systems are reviewed and negative.   Physical Exam: VS:  BP 129/65   Pulse 81   Ht 5' 6.5" (1.689 m)   Wt 159 lb (72.1 kg)   SpO2 100%   BMI 25.28 kg/m , BMI Body mass index is 25.28 kg/m.  Wt Readings from Last 3 Encounters:  08/05/18 159 lb (72.1 kg)  03/23/18 156 lb 15.5 oz (71.2 kg)  01/21/18 158 lb 15.2 oz (72.1 kg)    General: Elderly woman, appears comfortable at rest. HEENT: Conjunctiva and lids normal, oropharynx clear. Neck: Supple, no elevated JVP or carotid bruits, no thyromegaly. Lungs: Clear to auscultation, nonlabored breathing at rest. Cardiac:  Irregularly irregular, no S3, 2/6 systolic murmur. Abdomen: Soft, nontender, bowel sounds present. Extremities: Trace ankle edema, distal pulses 2+. Skin: Warm and dry. Musculoskeletal: No kyphosis. Neuropsychiatric: Alert and oriented x3, affect grossly appropriate.  ECG: I personally reviewed the tracing from 03/21/2018 which showed atrial fibrillation with poor anterior R wave progression rule out old infarct pattern.  Recent Labwork: 10/28/2017: TSH 1.210 03/21/2018: ALT 15; AST 23; B Natriuretic Peptide 254.0 03/23/2018: BUN 17; Creatinine, Ser 1.03; Hemoglobin 11.3; Magnesium 2.2; Platelets 308; Potassium 4.0; Sodium 139     Component Value Date/Time   CHOL 190 12/24/2011 0545   TRIG 139 12/24/2011 0545   HDL 36 (L) 12/24/2011 0545   CHOLHDL 5.3 12/24/2011 0545   VLDL 28 12/24/2011 0545   LDLCALC 126 (H) 12/24/2011 0545    Other Studies Reviewed Today:  Echocardiogram 03/22/2018: Study Conclusions  - Left ventricle: The cavity size was normal. Wall thickness was   normal. Systolic function was normal. The estimated ejection   fraction was in the range of 55% to 60%. Wall motion was normal;   there were no regional wall motion abnormalities. The study was   not technically sufficient to allow evaluation of LV diastolic   dysfunction due to atrial fibrillation. - Aortic valve: Mildly to moderately calcified annulus. Mildly   calcified leaflets. There was trivial regurgitation.   Morphologically, there appears to be at least moderate calcific   aortic stenosis. - Mitral valve: Calcified annulus. There was moderate   regurgitation. - Left atrium: The atrium was severely dilated. - Right ventricle: Systolic function was mildly reduced. - Right atrium: The atrium was mildly dilated. - Atrial septum: No defect or patent foramen ovale was identified. - Tricuspid valve: There was mild-moderate regurgitation. - Pulmonary arteries: PA peak pressure: 39 mm Hg (S).  Assessment and  Plan:  1.  Permanent atrial fibrillation.  Continue strategy of heart rate control and anticoagulation.  She continues on Coumadin with follow-up by Dr. Quillian Quince.  2.  Apparent acute on chronic diastolic heart failure, diuretics increased last month by Dr. Quillian Quince.  Echocardiogram done back in August of this year revealed LVEF 55 to 60% range with at least moderate calcific aortic stenosis.  3.  Aortic stenosis, moderate by last assessment.  4.  Essential hypertension, blood pressure is adequately controlled today.  Current medicines were reviewed with the patient today.  Disposition: Follow-up in 6 months.  Signed, Satira Sark, MD, [facility] 08/05/2018 3:44 PM    [place] [facility] [place], [place],  [address] Phone: [phone]; Fax: [phone]

## 2018-08-05 NOTE — Patient Instructions (Addendum)

## 2018-08-06 ENCOUNTER — Ambulatory Visit: Payer: Self-pay | Admitting: Adult Health

## 2018-08-21 DIAGNOSIS — I5032 Chronic diastolic (congestive) heart failure: Secondary | ICD-10-CM | POA: Diagnosis not present

## 2018-08-21 DIAGNOSIS — I350 Nonrheumatic aortic (valve) stenosis: Secondary | ICD-10-CM | POA: Diagnosis not present

## 2018-08-21 DIAGNOSIS — E8881 Metabolic syndrome: Secondary | ICD-10-CM | POA: Diagnosis not present

## 2018-08-21 DIAGNOSIS — R7301 Impaired fasting glucose: Secondary | ICD-10-CM | POA: Diagnosis not present

## 2018-08-21 DIAGNOSIS — E782 Mixed hyperlipidemia: Secondary | ICD-10-CM | POA: Diagnosis not present

## 2018-08-21 DIAGNOSIS — K210 Gastro-esophageal reflux disease with esophagitis: Secondary | ICD-10-CM | POA: Diagnosis not present

## 2018-08-21 DIAGNOSIS — I10 Essential (primary) hypertension: Secondary | ICD-10-CM | POA: Diagnosis not present

## 2018-08-24 DIAGNOSIS — E782 Mixed hyperlipidemia: Secondary | ICD-10-CM | POA: Diagnosis not present

## 2018-08-24 DIAGNOSIS — I10 Essential (primary) hypertension: Secondary | ICD-10-CM | POA: Diagnosis not present

## 2018-08-24 DIAGNOSIS — H6123 Impacted cerumen, bilateral: Secondary | ICD-10-CM | POA: Diagnosis not present

## 2018-08-24 DIAGNOSIS — N183 Chronic kidney disease, stage 3 (moderate): Secondary | ICD-10-CM | POA: Diagnosis not present

## 2018-08-24 DIAGNOSIS — R7301 Impaired fasting glucose: Secondary | ICD-10-CM | POA: Diagnosis not present

## 2018-08-24 DIAGNOSIS — I5032 Chronic diastolic (congestive) heart failure: Secondary | ICD-10-CM | POA: Diagnosis not present

## 2018-08-24 DIAGNOSIS — I4949 Other premature depolarization: Secondary | ICD-10-CM | POA: Diagnosis not present

## 2018-08-24 DIAGNOSIS — Z6825 Body mass index (BMI) 25.0-25.9, adult: Secondary | ICD-10-CM | POA: Diagnosis not present

## 2018-08-28 DIAGNOSIS — I350 Nonrheumatic aortic (valve) stenosis: Secondary | ICD-10-CM | POA: Diagnosis not present

## 2018-08-31 DIAGNOSIS — I350 Nonrheumatic aortic (valve) stenosis: Secondary | ICD-10-CM | POA: Diagnosis not present

## 2018-08-31 DIAGNOSIS — D519 Vitamin B12 deficiency anemia, unspecified: Secondary | ICD-10-CM | POA: Diagnosis not present

## 2018-09-11 DIAGNOSIS — I350 Nonrheumatic aortic (valve) stenosis: Secondary | ICD-10-CM | POA: Diagnosis not present

## 2018-09-25 DIAGNOSIS — Z6826 Body mass index (BMI) 26.0-26.9, adult: Secondary | ICD-10-CM | POA: Diagnosis not present

## 2018-09-25 DIAGNOSIS — M1712 Unilateral primary osteoarthritis, left knee: Secondary | ICD-10-CM | POA: Diagnosis not present

## 2018-09-25 DIAGNOSIS — I4891 Unspecified atrial fibrillation: Secondary | ICD-10-CM | POA: Diagnosis not present

## 2018-09-25 DIAGNOSIS — I350 Nonrheumatic aortic (valve) stenosis: Secondary | ICD-10-CM | POA: Diagnosis not present

## 2018-09-25 DIAGNOSIS — D519 Vitamin B12 deficiency anemia, unspecified: Secondary | ICD-10-CM | POA: Diagnosis not present

## 2018-09-29 DIAGNOSIS — I4891 Unspecified atrial fibrillation: Secondary | ICD-10-CM | POA: Diagnosis not present

## 2018-09-29 DIAGNOSIS — I350 Nonrheumatic aortic (valve) stenosis: Secondary | ICD-10-CM | POA: Diagnosis not present

## 2018-10-06 DIAGNOSIS — E039 Hypothyroidism, unspecified: Secondary | ICD-10-CM | POA: Diagnosis not present

## 2018-10-06 DIAGNOSIS — I350 Nonrheumatic aortic (valve) stenosis: Secondary | ICD-10-CM | POA: Diagnosis not present

## 2018-10-20 DIAGNOSIS — I4891 Unspecified atrial fibrillation: Secondary | ICD-10-CM | POA: Diagnosis not present

## 2018-10-20 DIAGNOSIS — D519 Vitamin B12 deficiency anemia, unspecified: Secondary | ICD-10-CM | POA: Diagnosis not present

## 2018-10-20 DIAGNOSIS — I350 Nonrheumatic aortic (valve) stenosis: Secondary | ICD-10-CM | POA: Diagnosis not present

## 2018-10-20 DIAGNOSIS — Z6826 Body mass index (BMI) 26.0-26.9, adult: Secondary | ICD-10-CM | POA: Diagnosis not present

## 2018-10-20 DIAGNOSIS — G8191 Hemiplegia, unspecified affecting right dominant side: Secondary | ICD-10-CM | POA: Diagnosis not present

## 2018-10-27 DIAGNOSIS — G8191 Hemiplegia, unspecified affecting right dominant side: Secondary | ICD-10-CM | POA: Diagnosis not present

## 2018-11-03 DIAGNOSIS — I350 Nonrheumatic aortic (valve) stenosis: Secondary | ICD-10-CM | POA: Diagnosis not present

## 2018-11-16 DIAGNOSIS — Z13220 Encounter for screening for lipoid disorders: Secondary | ICD-10-CM | POA: Diagnosis not present

## 2018-11-16 DIAGNOSIS — I4891 Unspecified atrial fibrillation: Secondary | ICD-10-CM | POA: Diagnosis not present

## 2018-11-16 DIAGNOSIS — R946 Abnormal results of thyroid function studies: Secondary | ICD-10-CM | POA: Diagnosis not present

## 2018-11-16 DIAGNOSIS — D519 Vitamin B12 deficiency anemia, unspecified: Secondary | ICD-10-CM | POA: Diagnosis not present

## 2018-11-16 DIAGNOSIS — I350 Nonrheumatic aortic (valve) stenosis: Secondary | ICD-10-CM | POA: Diagnosis not present

## 2018-11-16 DIAGNOSIS — E782 Mixed hyperlipidemia: Secondary | ICD-10-CM | POA: Diagnosis not present

## 2018-11-17 DIAGNOSIS — I480 Paroxysmal atrial fibrillation: Secondary | ICD-10-CM | POA: Diagnosis not present

## 2018-11-17 DIAGNOSIS — E782 Mixed hyperlipidemia: Secondary | ICD-10-CM | POA: Diagnosis not present

## 2018-11-20 DIAGNOSIS — R7301 Impaired fasting glucose: Secondary | ICD-10-CM | POA: Diagnosis not present

## 2018-11-20 DIAGNOSIS — I4891 Unspecified atrial fibrillation: Secondary | ICD-10-CM | POA: Diagnosis not present

## 2018-11-20 DIAGNOSIS — I10 Essential (primary) hypertension: Secondary | ICD-10-CM | POA: Diagnosis not present

## 2018-11-20 DIAGNOSIS — I5032 Chronic diastolic (congestive) heart failure: Secondary | ICD-10-CM | POA: Diagnosis not present

## 2018-11-20 DIAGNOSIS — E8881 Metabolic syndrome: Secondary | ICD-10-CM | POA: Diagnosis not present

## 2018-11-20 DIAGNOSIS — K219 Gastro-esophageal reflux disease without esophagitis: Secondary | ICD-10-CM | POA: Diagnosis not present

## 2018-11-20 DIAGNOSIS — E782 Mixed hyperlipidemia: Secondary | ICD-10-CM | POA: Diagnosis not present

## 2018-11-20 DIAGNOSIS — N183 Chronic kidney disease, stage 3 (moderate): Secondary | ICD-10-CM | POA: Diagnosis not present

## 2018-11-23 DIAGNOSIS — I4891 Unspecified atrial fibrillation: Secondary | ICD-10-CM | POA: Diagnosis not present

## 2018-11-30 DIAGNOSIS — I350 Nonrheumatic aortic (valve) stenosis: Secondary | ICD-10-CM | POA: Diagnosis not present

## 2018-11-30 DIAGNOSIS — I4891 Unspecified atrial fibrillation: Secondary | ICD-10-CM | POA: Diagnosis not present

## 2018-12-28 DIAGNOSIS — I4891 Unspecified atrial fibrillation: Secondary | ICD-10-CM | POA: Diagnosis not present

## 2018-12-28 DIAGNOSIS — D519 Vitamin B12 deficiency anemia, unspecified: Secondary | ICD-10-CM | POA: Diagnosis not present

## 2019-01-16 DIAGNOSIS — E782 Mixed hyperlipidemia: Secondary | ICD-10-CM | POA: Diagnosis not present

## 2019-01-16 DIAGNOSIS — F331 Major depressive disorder, recurrent, moderate: Secondary | ICD-10-CM | POA: Diagnosis not present

## 2019-01-25 DIAGNOSIS — D519 Vitamin B12 deficiency anemia, unspecified: Secondary | ICD-10-CM | POA: Diagnosis not present

## 2019-01-25 DIAGNOSIS — I350 Nonrheumatic aortic (valve) stenosis: Secondary | ICD-10-CM | POA: Diagnosis not present

## 2019-01-25 DIAGNOSIS — Z6827 Body mass index (BMI) 27.0-27.9, adult: Secondary | ICD-10-CM | POA: Diagnosis not present

## 2019-01-29 ENCOUNTER — Telehealth: Payer: Self-pay | Admitting: Cardiology

## 2019-01-29 NOTE — Telephone Encounter (Signed)
Virtual Visit Pre-Appointment Phone Call  "(Name), I am calling you today to discuss your upcoming appointment. We are currently trying to limit exposure to the virus that causes COVID-19 by seeing patients at home rather than in the office."  1. "What is the BEST phone number to call the day of the visit?" - include this in appointment notes  2. Do you have or have access to (through a family member/friend) a smartphone with video capability that we can use for your visit?" a. If yes - list this number in appt notes as cell (if different from BEST phone #) and list the appointment type as a VIDEO visit in appointment notes b. If no - list the appointment type as a PHONE visit in appointment notes  3. Confirm consent - "In the setting of the current Covid19 crisis, you are scheduled for a (phone or video) visit with your provider on (date) at (time).  Just as we do with many in-office visits, in order for you to participate in this visit, we must obtain consent.  If you'd like, I can send this to your mychart (if signed up) or email for you to review.  Otherwise, I can obtain your verbal consent now.  All virtual visits are billed to your insurance company just like a normal visit would be.  By agreeing to a virtual visit, we'd like you to understand that the technology does not allow for your provider to perform an examination, and thus may limit your provider's ability to fully assess your condition. If your provider identifies any concerns that need to be evaluated in person, we will make arrangements to do so.  Finally, though the technology is pretty good, we cannot assure that it will always work on either your or our end, and in the setting of a video visit, we may have to convert it to a phone-only visit.  In either situation, we cannot ensure that we have a secure connection.  Are you willing to proceed?" STAFF: Did the patient verbally acknowledge consent to telehealth visit? Document  YES/NO here: yes  4. Advise patient to be prepared - "Two hours prior to your appointment, go ahead and check your blood pressure, pulse, oxygen saturation, and your weight (if you have the equipment to check those) and write them all down. When your visit starts, your provider will ask you for this information. If you have an Apple Watch or Kardia device, please plan to have heart rate information ready on the day of your appointment. Please have a pen and paper handy nearby the day of the visit as well."  5. Give patient instructions for MyChart download to smartphone OR [email or web address] as below if video visit (depending on what platform provider is using)  6. Inform patient they will receive a phone call 15 minutes prior to their appointment time (may be from unknown caller ID) so they should be prepared to answer    TELEPHONE CALL NOTE  [name] [name] has been deemed a candidate for a follow-up tele-health visit to limit community exposure during the Covid-19 pandemic. I spoke with the patient via phone to ensure availability of phone/video source, confirm preferred email & phone number, and discuss instructions and expectations.  I reminded [name] [name] to be prepared with any vital sign and/or heart rhythm information that could potentially be obtained via home monitoring, at the time of her visit. I reminded [name] [name] to expect a phone call prior to  her visit.  [name] [name] 01/29/2019 3:25 PM   INSTRUCTIONS FOR DOWNLOADING THE MYCHART APP TO SMARTPHONE  - The patient must first make sure to have activated MyChart and know their login information - If Apple, go to [employer] and type in MyChart in the search bar and download the app. If Android, ask patient to go to [employer] and type in [place] in the search bar and download the app. The app is free but as with any other app downloads, their phone may require them to verify saved payment information or Apple/Android  password.  - The patient will need to then log into the app with their MyChart username and password, and select [facility] as their healthcare provider to link the account. When it is time for your visit, go to the MyChart app, find appointments, and click Begin Video Visit. Be sure to Select Allow for your device to access the Microphone and Camera for your visit. You will then be connected, and your provider will be with you shortly.  **If they have any issues connecting, or need assistance please contact MyChart service desk [phone])**  **If using a computer, in order to ensure the best quality for their visit they will need to use either of the following Internet Browsers: [employer], or Google Chrome**  IF USING DOXIMITY or DOXY.ME - The patient will receive a link just prior to their visit by text.     FULL LENGTH CONSENT FOR TELE-HEALTH VISIT   I hereby voluntarily request, consent and authorize [facility] and its employed or contracted physicians, physician assistants, nurse practitioners or other licensed health care professionals (the Practitioner), to provide me with telemedicine health care services (the Services") as deemed necessary by the treating Practitioner. I acknowledge and consent to receive the Services by the Practitioner via telemedicine. I understand that the telemedicine visit will involve communicating with the Practitioner through live audiovisual communication technology and the disclosure of certain medical information by electronic transmission. I acknowledge that I have been given the opportunity to request an in-person assessment or other available alternative prior to the telemedicine visit and am voluntarily participating in the telemedicine visit.  I understand that I have the right to withhold or withdraw my consent to the use of telemedicine in the course of my care at any time, without affecting my right to future care or treatment,  and that the Practitioner or I may terminate the telemedicine visit at any time. I understand that I have the right to inspect all information obtained and/or recorded in the course of the telemedicine visit and may receive copies of available information for a reasonable fee.  I understand that some of the potential risks of receiving the Services via telemedicine include:   Delay or interruption in medical evaluation due to technological equipment failure or disruption;  Information transmitted may not be sufficient (e.g. poor resolution of images) to allow for appropriate medical decision making by the Practitioner; and/or   In rare instances, security protocols could fail, causing a breach of personal health information.  Furthermore, I acknowledge that it is my responsibility to provide information about my medical history, conditions and care that is complete and accurate to the best of my ability. I acknowledge that Practitioner's advice, recommendations, and/or decision may be based on factors not within their control, such as incomplete or inaccurate data provided by me or distortions of diagnostic images or specimens that may result from electronic transmissions. I  understand that the practice of medicine is not an exact science and that Practitioner makes no warranties or guarantees regarding treatment outcomes. I acknowledge that I will receive a copy of this consent concurrently upon execution via email to the email address I last provided but may also request a printed copy by calling the office of [facility].    I understand that my insurance will be billed for this visit.   I have read or had this consent read to me.  I understand the contents of this consent, which adequately explains the benefits and risks of the Services being provided via telemedicine.   I have been provided ample opportunity to ask questions regarding this consent and the Services and have had my questions  answered to my satisfaction.  I give my informed consent for the services to be provided through the use of telemedicine in my medical care  By participating in this telemedicine visit I agree to the above.

## 2019-02-02 NOTE — Progress Notes (Signed)
Virtual Visit via Telephone Note   This visit type was conducted due to national recommendations for restrictions regarding the COVID-19 Pandemic (e.g. social distancing) in an effort to limit this patient's exposure and mitigate transmission in our community.  Due to her co-morbid illnesses, this patient is at least at moderate risk for complications without adequate follow up.  This format is felt to be most appropriate for this patient at this time.  The patient did not have access to video technology/had technical difficulties with video requiring transitioning to audio format only (telephone).  All issues noted in this document were discussed and addressed.  No physical exam could be performed with this format.  Please refer to the patient's chart for her  consent to telehealth for [facility].   Date:  02/03/2019   ID:  [name] [name], DOB 04-08-1930, MRN [id number]  Patient Location: Home Provider Location: Office   PCP:  Caryl Bis, MD  Cardiologist:  Rozann Lesches, MD Electrophysiologist:  None   Evaluation Performed:  Follow-Up Visit  Chief Complaint:  Cardiac follow-up  History of Present Illness:    [name] [name] is an 85 y.o. female last seen in December 2019.  She did not have video access and we spoke by phone today.  She states that she has been doing about the same, no progressive shortness of breath, no palpitations or chest pain.  She is on Coumadin with follow-up by Dr. Quillian Quince.  She reports no bleeding problems and monthly follow-up at this point.  Echocardiogram from August 2019 revealed LVEF 55 to 60% with at least moderate aortic stenosis.  I reviewed her medications with are outlined below.  Heart rate control regimen includes Cardizem CD and Toprol-XL.  The patient does not have symptoms concerning for COVID-19 infection (fever, chills, cough, or new shortness of breath).    Past Medical History:  Diagnosis Date  . Atrial fibrillation ([place])    On  Coumadin  . Coronary atherosclerosis of native coronary artery    Nonobstructive at cardiac catheterization 2004  . Depression   . Dysphagia   . Essential hypertension, benign   . Hyperlipidemia   . Polymyalgia rheumatica ([place])   . Stroke [facility])    Past Surgical History:  Procedure Laterality Date  . APPENDECTOMY    . COLONOSCOPY  2008   Dr.Fleshiman  . Left ankle    . TOE SURGERY    . TOTAL ABDOMINAL HYSTERECTOMY       Current Meds  Medication Sig  . acetaminophen (TYLENOL) 500 MG tablet Take 500-1,000 mg by mouth daily as needed for moderate pain.   Marland Kitchen atorvastatin (LIPITOR) 40 MG tablet Take 40 mg by mouth at bedtime.   . Biotin 1000 MCG tablet Take 1,000 mcg by mouth daily.  . Calcium Carbonate-Vitamin D (CALTRATE 600+D) 600-400 MG-UNIT per tablet Take 1 tablet by mouth daily.    Marland Kitchen dicyclomine (BENTYL) 20 MG tablet Take 1 tablet by mouth 2 (two) times a day.  . diltiazem (CARDIZEM) 120 MG tablet Take 120 mg by mouth 2 (two) times daily.  . DULoxetine (CYMBALTA) 30 MG capsule Take 30 mg by mouth daily.  Marland Kitchen escitalopram (LEXAPRO) 10 MG tablet Take 10 mg by mouth daily.  . famotidine (PEPCID) 20 MG tablet Take 1 tablet by mouth 2 (two) times daily.  . furosemide (LASIX) 40 MG tablet Take 40 mg by mouth daily.   Marland Kitchen losartan (COZAAR) 50 MG tablet Take 50 mg by mouth every evening.   Marland Kitchen  metoprolol succinate (TOPROL-XL) 25 MG 24 hr tablet Take 3 tablets (75 mg total) by mouth at bedtime.  . potassium chloride SA (K-DUR,KLOR-CON) 20 MEQ tablet TAKE ONE TABLET BY MOUTH EVERY DAY FOR LOW POTASSIUM  . predniSONE (DELTASONE) 5 MG tablet Take 10 mg by mouth daily.   Marland Kitchen warfarin (COUMADIN) 4 MG tablet Take 2-4 mg by mouth See admin instructions. Patient takes 4mg  on one evening, then takes 2mg  for two evenings, then then takes 4mg , etc. (4mg , 2mg , 2mg , 4mg , etc) DAYSPRING MANAGING     Allergies:   Sulfonamide derivatives   Social History   Tobacco Use  . Smoking status: Never Smoker  .  Smokeless tobacco: Never Used  Substance Use Topics  . Alcohol use: No    Alcohol/week: 0.0 standard drinks  . Drug use: No     Family Hx: The patient's family history includes Arthritis in her brother; Colon cancer in her brother; Coronary artery disease in an other family member; Healthy in her son, son, and son; Heart disease in her brother and sister; Hypertension in her brother, brother, brother, sister, and sister; Rheum arthritis in her daughter; Stroke in her mother.  ROS:   Please see the history of present illness. All other systems reviewed and are negative.   Prior CV studies:   The following studies were reviewed today:  Echocardiogram 03/22/2018: Study Conclusions  - Left ventricle: The cavity size was normal. Wall thickness was normal. Systolic function was normal. The estimated ejection fraction was in the range of 55% to 60%. Wall motion was normal; there were no regional wall motion abnormalities. The study was not technically sufficient to allow evaluation of LV diastolic dysfunction due to atrial fibrillation. - Aortic valve: Mildly to moderately calcified annulus. Mildly calcified leaflets. There was trivial regurgitation. Morphologically, there appears to be at least moderate calcific aortic stenosis. - Mitral valve: Calcified annulus. There was moderate regurgitation. - Left atrium: The atrium was severely dilated. - Right ventricle: Systolic function was mildly reduced. - Right atrium: The atrium was mildly dilated. - Atrial septum: No defect or patent foramen ovale was identified. - Tricuspid valve: There was mild-moderate regurgitation. - Pulmonary arteries: PA peak pressure: 39 mm Hg (S).  Labs/Other Tests and Data Reviewed:    EKG:  An ECG dated 03/21/2018 was personally reviewed today and demonstrated:  Atrial fibrillation with poor anterior R wave progression, rule out old infarct pattern.  Recent Labs: 03/21/2018: ALT 15; B  Natriuretic Peptide 254.0 03/23/2018: BUN 17; Creatinine, Ser 1.03; Hemoglobin 11.3; Magnesium 2.2; Platelets 308; Potassium 4.0; Sodium 139  October 2019: Hemoglobin 12.9, platelets 303, TSH 0.643, BUN 19, creatinine 0.97, potassium 4.4, AST 14, ALT 17   Wt Readings from Last 3 Encounters:  02/03/19 166 lb (75.3 kg)  08/05/18 159 lb (72.1 kg)  03/23/18 156 lb 15.5 oz (71.2 kg)     Objective:    Vital Signs:  BP (!) 157/73   Pulse 86   Ht 5' 6.5" (1.689 m)   Wt 166 lb (75.3 kg)   BMI 26.39 kg/m    Patient spoke in full sentences, not short of breath. No audible wheezing or coughing. Speech pattern normal.  ASSESSMENT & PLAN:    1.  Permanent atrial fibrillation.  Heart rate control looks to be adequate on Cardizem CD and Toprol-XL.  She continues on Coumadin for stroke prophylaxis with follow-up by Dr. Quillian Quince.  2.  Chronic diastolic heart failure.  She is on Lasix at 40 mg  daily and states that she has had no swelling or orthopnea.  Last LVEF 55 to 60% by echocardiogram in August 2019.  3.  Moderate calcific aortic stenosis.  Follow-up echocardiogram to be obtained prior to her next visit.  COVID-19 Education: The signs and symptoms of COVID-19 were discussed with the patient and how to seek care for testing (follow up with PCP or arrange E-visit).  The importance of social distancing was discussed today.  Time:   Today, I have spent 6 minutes with the patient with telehealth technology discussing the above problems.     Medication Adjustments/Labs and Tests Ordered: Current medicines are reviewed at length with the patient today.  Concerns regarding medicines are outlined above.   Tests Ordered: Orders Placed This Encounter  Procedures  . ECHOCARDIOGRAM COMPLETE    Medication Changes: No orders of the defined types were placed in this encounter.   Follow Up:  In Person 4 months in the [place] office.  Signed, Rozann Lesches, MD  02/03/2019 2:19 PM    [facility]

## 2019-02-03 ENCOUNTER — Telehealth (INDEPENDENT_AMBULATORY_CARE_PROVIDER_SITE_OTHER): Payer: Medicare Other | Admitting: Cardiology

## 2019-02-03 ENCOUNTER — Encounter: Payer: Self-pay | Admitting: Cardiology

## 2019-02-03 VITALS — BP 157/73 | HR 86 | Ht 66.5 in | Wt 166.0 lb

## 2019-02-03 DIAGNOSIS — Z7189 Other specified counseling: Secondary | ICD-10-CM | POA: Diagnosis not present

## 2019-02-03 DIAGNOSIS — I350 Nonrheumatic aortic (valve) stenosis: Secondary | ICD-10-CM | POA: Diagnosis not present

## 2019-02-03 DIAGNOSIS — I4821 Permanent atrial fibrillation: Secondary | ICD-10-CM | POA: Diagnosis not present

## 2019-02-03 DIAGNOSIS — I5032 Chronic diastolic (congestive) heart failure: Secondary | ICD-10-CM | POA: Diagnosis not present

## 2019-02-03 DIAGNOSIS — I10 Essential (primary) hypertension: Secondary | ICD-10-CM

## 2019-02-03 NOTE — Patient Instructions (Addendum)
Medication Instructions:   Your physician recommends that you continue on your current medications as directed. Please refer to the Current Medication list given to you today.  Labwork:  NONE  Testing/Procedures: Your physician has requested that you have an echocardiogram in 4 months just before your next visit. Echocardiography is a painless test that uses sound waves to create images of your heart. It provides your doctor with information about the size and shape of your heart and how well your heart's chambers and valves are working. This procedure takes approximately one hour. There are no restrictions for this procedure.  Follow-Up:  Your physician recommends that you schedule a follow-up appointment in: 4 month. You will receive a reminder letter in the mail in about 2 months reminding you to call and schedule your appointment. If you don't receive this letter, please contact our office.  Any Other Special Instructions Will Be Listed Below (If Applicable).  If you need a refill on your cardiac medications before your next appointment, please call your pharmacy.

## 2019-02-08 DIAGNOSIS — M940 Chondrocostal junction syndrome [Tietze]: Secondary | ICD-10-CM | POA: Diagnosis not present

## 2019-02-08 DIAGNOSIS — Z6827 Body mass index (BMI) 27.0-27.9, adult: Secondary | ICD-10-CM | POA: Diagnosis not present

## 2019-02-08 DIAGNOSIS — I4891 Unspecified atrial fibrillation: Secondary | ICD-10-CM | POA: Diagnosis not present

## 2019-02-08 DIAGNOSIS — M26629 Arthralgia of temporomandibular joint, unspecified side: Secondary | ICD-10-CM | POA: Diagnosis not present

## 2019-02-22 DIAGNOSIS — D519 Vitamin B12 deficiency anemia, unspecified: Secondary | ICD-10-CM | POA: Diagnosis not present

## 2019-02-22 DIAGNOSIS — I4891 Unspecified atrial fibrillation: Secondary | ICD-10-CM | POA: Diagnosis not present

## 2019-02-22 DIAGNOSIS — I350 Nonrheumatic aortic (valve) stenosis: Secondary | ICD-10-CM | POA: Diagnosis not present

## 2019-02-23 ENCOUNTER — Ambulatory Visit (INDEPENDENT_AMBULATORY_CARE_PROVIDER_SITE_OTHER): Payer: Medicare Other | Admitting: Internal Medicine

## 2019-02-24 DIAGNOSIS — K625 Hemorrhage of anus and rectum: Secondary | ICD-10-CM | POA: Diagnosis not present

## 2019-02-24 DIAGNOSIS — Z6827 Body mass index (BMI) 27.0-27.9, adult: Secondary | ICD-10-CM | POA: Diagnosis not present

## 2019-02-25 ENCOUNTER — Other Ambulatory Visit: Payer: Medicare Other

## 2019-02-26 DIAGNOSIS — I4891 Unspecified atrial fibrillation: Secondary | ICD-10-CM | POA: Diagnosis not present

## 2019-03-04 DIAGNOSIS — H353130 Nonexudative age-related macular degeneration, bilateral, stage unspecified: Secondary | ICD-10-CM | POA: Diagnosis not present

## 2019-03-09 DIAGNOSIS — Z1159 Encounter for screening for other viral diseases: Secondary | ICD-10-CM | POA: Diagnosis not present

## 2019-03-11 DIAGNOSIS — M1990 Unspecified osteoarthritis, unspecified site: Secondary | ICD-10-CM | POA: Diagnosis not present

## 2019-03-11 DIAGNOSIS — Z8673 Personal history of transient ischemic attack (TIA), and cerebral infarction without residual deficits: Secondary | ICD-10-CM | POA: Diagnosis not present

## 2019-03-11 DIAGNOSIS — K5731 Diverticulosis of large intestine without perforation or abscess with bleeding: Secondary | ICD-10-CM | POA: Diagnosis not present

## 2019-03-11 DIAGNOSIS — I639 Cerebral infarction, unspecified: Secondary | ICD-10-CM | POA: Diagnosis not present

## 2019-03-11 DIAGNOSIS — Z90710 Acquired absence of both cervix and uterus: Secondary | ICD-10-CM | POA: Diagnosis not present

## 2019-03-11 DIAGNOSIS — K621 Rectal polyp: Secondary | ICD-10-CM | POA: Diagnosis not present

## 2019-03-11 DIAGNOSIS — I509 Heart failure, unspecified: Secondary | ICD-10-CM | POA: Diagnosis not present

## 2019-03-11 DIAGNOSIS — Z79899 Other long term (current) drug therapy: Secondary | ICD-10-CM | POA: Diagnosis not present

## 2019-03-11 DIAGNOSIS — K5730 Diverticulosis of large intestine without perforation or abscess without bleeding: Secondary | ICD-10-CM | POA: Diagnosis not present

## 2019-03-11 DIAGNOSIS — E7800 Pure hypercholesterolemia, unspecified: Secondary | ICD-10-CM | POA: Diagnosis not present

## 2019-03-11 DIAGNOSIS — Z7901 Long term (current) use of anticoagulants: Secondary | ICD-10-CM | POA: Diagnosis not present

## 2019-03-11 DIAGNOSIS — Z882 Allergy status to sulfonamides status: Secondary | ICD-10-CM | POA: Diagnosis not present

## 2019-03-11 DIAGNOSIS — K625 Hemorrhage of anus and rectum: Secondary | ICD-10-CM | POA: Diagnosis not present

## 2019-03-11 DIAGNOSIS — I4891 Unspecified atrial fibrillation: Secondary | ICD-10-CM | POA: Diagnosis not present

## 2019-03-11 DIAGNOSIS — I10 Essential (primary) hypertension: Secondary | ICD-10-CM | POA: Diagnosis not present

## 2019-03-11 DIAGNOSIS — I110 Hypertensive heart disease with heart failure: Secondary | ICD-10-CM | POA: Diagnosis not present

## 2019-03-11 DIAGNOSIS — D128 Benign neoplasm of rectum: Secondary | ICD-10-CM | POA: Diagnosis not present

## 2019-03-15 DIAGNOSIS — I69398 Other sequelae of cerebral infarction: Secondary | ICD-10-CM | POA: Diagnosis not present

## 2019-03-15 DIAGNOSIS — I5042 Chronic combined systolic (congestive) and diastolic (congestive) heart failure: Secondary | ICD-10-CM | POA: Diagnosis not present

## 2019-03-15 DIAGNOSIS — R29810 Facial weakness: Secondary | ICD-10-CM | POA: Diagnosis not present

## 2019-03-15 DIAGNOSIS — I4821 Permanent atrial fibrillation: Secondary | ICD-10-CM | POA: Diagnosis not present

## 2019-03-15 DIAGNOSIS — S82202A Unspecified fracture of shaft of left tibia, initial encounter for closed fracture: Secondary | ICD-10-CM | POA: Diagnosis not present

## 2019-03-15 DIAGNOSIS — R001 Bradycardia, unspecified: Secondary | ICD-10-CM | POA: Diagnosis not present

## 2019-03-15 DIAGNOSIS — I083 Combined rheumatic disorders of mitral, aortic and tricuspid valves: Secondary | ICD-10-CM | POA: Diagnosis not present

## 2019-03-15 DIAGNOSIS — S82392A Other fracture of lower end of left tibia, initial encounter for closed fracture: Secondary | ICD-10-CM | POA: Diagnosis not present

## 2019-03-15 DIAGNOSIS — Z7409 Other reduced mobility: Secondary | ICD-10-CM | POA: Diagnosis not present

## 2019-03-15 DIAGNOSIS — I63411 Cerebral infarction due to embolism of right middle cerebral artery: Secondary | ICD-10-CM | POA: Diagnosis not present

## 2019-03-15 DIAGNOSIS — H518 Other specified disorders of binocular movement: Secondary | ICD-10-CM | POA: Diagnosis present

## 2019-03-15 DIAGNOSIS — S82432A Displaced oblique fracture of shaft of left fibula, initial encounter for closed fracture: Secondary | ICD-10-CM | POA: Diagnosis not present

## 2019-03-15 DIAGNOSIS — G9340 Encephalopathy, unspecified: Secondary | ICD-10-CM | POA: Diagnosis not present

## 2019-03-15 DIAGNOSIS — I63511 Cerebral infarction due to unspecified occlusion or stenosis of right middle cerebral artery: Secondary | ICD-10-CM | POA: Diagnosis not present

## 2019-03-15 DIAGNOSIS — R7303 Prediabetes: Secondary | ICD-10-CM | POA: Diagnosis present

## 2019-03-15 DIAGNOSIS — G459 Transient cerebral ischemic attack, unspecified: Secondary | ICD-10-CM | POA: Diagnosis not present

## 2019-03-15 DIAGNOSIS — S82832A Other fracture of upper and lower end of left fibula, initial encounter for closed fracture: Secondary | ICD-10-CM | POA: Diagnosis not present

## 2019-03-15 DIAGNOSIS — E7800 Pure hypercholesterolemia, unspecified: Secondary | ICD-10-CM | POA: Diagnosis not present

## 2019-03-15 DIAGNOSIS — E785 Hyperlipidemia, unspecified: Secondary | ICD-10-CM | POA: Diagnosis not present

## 2019-03-15 DIAGNOSIS — I10 Essential (primary) hypertension: Secondary | ICD-10-CM | POA: Diagnosis not present

## 2019-03-15 DIAGNOSIS — I639 Cerebral infarction, unspecified: Secondary | ICD-10-CM | POA: Diagnosis not present

## 2019-03-15 DIAGNOSIS — Z209 Contact with and (suspected) exposure to unspecified communicable disease: Secondary | ICD-10-CM | POA: Diagnosis not present

## 2019-03-15 DIAGNOSIS — R402421 Glasgow coma scale score 9-12, in the field [EMT or ambulance]: Secondary | ICD-10-CM | POA: Diagnosis not present

## 2019-03-15 DIAGNOSIS — Z7952 Long term (current) use of systemic steroids: Secondary | ICD-10-CM | POA: Diagnosis not present

## 2019-03-15 DIAGNOSIS — I6389 Other cerebral infarction: Secondary | ICD-10-CM | POA: Diagnosis not present

## 2019-03-15 DIAGNOSIS — M6281 Muscle weakness (generalized): Secondary | ICD-10-CM | POA: Diagnosis not present

## 2019-03-15 DIAGNOSIS — H9193 Unspecified hearing loss, bilateral: Secondary | ICD-10-CM | POA: Diagnosis not present

## 2019-03-15 DIAGNOSIS — S82122A Displaced fracture of lateral condyle of left tibia, initial encounter for closed fracture: Secondary | ICD-10-CM | POA: Diagnosis not present

## 2019-03-15 DIAGNOSIS — R55 Syncope and collapse: Secondary | ICD-10-CM | POA: Diagnosis not present

## 2019-03-15 DIAGNOSIS — I482 Chronic atrial fibrillation, unspecified: Secondary | ICD-10-CM | POA: Diagnosis not present

## 2019-03-15 DIAGNOSIS — Z950 Presence of cardiac pacemaker: Secondary | ICD-10-CM | POA: Diagnosis not present

## 2019-03-15 DIAGNOSIS — Z743 Need for continuous supervision: Secondary | ICD-10-CM | POA: Diagnosis not present

## 2019-03-15 DIAGNOSIS — R4781 Slurred speech: Secondary | ICD-10-CM | POA: Diagnosis not present

## 2019-03-15 DIAGNOSIS — R52 Pain, unspecified: Secondary | ICD-10-CM | POA: Diagnosis not present

## 2019-03-15 DIAGNOSIS — E876 Hypokalemia: Secondary | ICD-10-CM | POA: Diagnosis not present

## 2019-03-15 DIAGNOSIS — I350 Nonrheumatic aortic (valve) stenosis: Secondary | ICD-10-CM | POA: Diagnosis not present

## 2019-03-15 DIAGNOSIS — R05 Cough: Secondary | ICD-10-CM | POA: Diagnosis not present

## 2019-03-15 DIAGNOSIS — Z20828 Contact with and (suspected) exposure to other viral communicable diseases: Secondary | ICD-10-CM | POA: Diagnosis not present

## 2019-03-15 DIAGNOSIS — M353 Polymyalgia rheumatica: Secondary | ICD-10-CM | POA: Diagnosis not present

## 2019-03-15 DIAGNOSIS — R0602 Shortness of breath: Secondary | ICD-10-CM | POA: Diagnosis not present

## 2019-03-15 DIAGNOSIS — R42 Dizziness and giddiness: Secondary | ICD-10-CM | POA: Diagnosis not present

## 2019-03-15 DIAGNOSIS — K5792 Diverticulitis of intestine, part unspecified, without perforation or abscess without bleeding: Secondary | ICD-10-CM | POA: Diagnosis not present

## 2019-03-15 DIAGNOSIS — R2689 Other abnormalities of gait and mobility: Secondary | ICD-10-CM | POA: Diagnosis not present

## 2019-03-15 DIAGNOSIS — I4891 Unspecified atrial fibrillation: Secondary | ICD-10-CM | POA: Diagnosis not present

## 2019-03-15 DIAGNOSIS — E875 Hyperkalemia: Secondary | ICD-10-CM | POA: Diagnosis not present

## 2019-03-15 DIAGNOSIS — Z7901 Long term (current) use of anticoagulants: Secondary | ICD-10-CM | POA: Diagnosis not present

## 2019-03-15 DIAGNOSIS — I517 Cardiomegaly: Secondary | ICD-10-CM | POA: Diagnosis not present

## 2019-03-15 DIAGNOSIS — R278 Other lack of coordination: Secondary | ICD-10-CM | POA: Diagnosis not present

## 2019-03-15 DIAGNOSIS — R509 Fever, unspecified: Secondary | ICD-10-CM | POA: Diagnosis not present

## 2019-03-15 DIAGNOSIS — T460X1S Poisoning by cardiac-stimulant glycosides and drugs of similar action, accidental (unintentional), sequela: Secondary | ICD-10-CM | POA: Diagnosis not present

## 2019-03-15 DIAGNOSIS — E782 Mixed hyperlipidemia: Secondary | ICD-10-CM | POA: Diagnosis not present

## 2019-03-15 DIAGNOSIS — J449 Chronic obstructive pulmonary disease, unspecified: Secondary | ICD-10-CM | POA: Diagnosis not present

## 2019-03-15 DIAGNOSIS — R404 Transient alteration of awareness: Secondary | ICD-10-CM | POA: Diagnosis not present

## 2019-03-15 DIAGNOSIS — K589 Irritable bowel syndrome without diarrhea: Secondary | ICD-10-CM | POA: Diagnosis not present

## 2019-03-15 DIAGNOSIS — S82302A Unspecified fracture of lower end of left tibia, initial encounter for closed fracture: Secondary | ICD-10-CM | POA: Diagnosis not present

## 2019-03-15 DIAGNOSIS — I69351 Hemiplegia and hemiparesis following cerebral infarction affecting right dominant side: Secondary | ICD-10-CM | POA: Diagnosis not present

## 2019-03-15 DIAGNOSIS — I110 Hypertensive heart disease with heart failure: Secondary | ICD-10-CM | POA: Diagnosis present

## 2019-03-15 DIAGNOSIS — I495 Sick sinus syndrome: Secondary | ICD-10-CM | POA: Diagnosis not present

## 2019-03-15 DIAGNOSIS — Z66 Do not resuscitate: Secondary | ICD-10-CM | POA: Diagnosis not present

## 2019-03-15 DIAGNOSIS — S82232A Displaced oblique fracture of shaft of left tibia, initial encounter for closed fracture: Secondary | ICD-10-CM | POA: Diagnosis not present

## 2019-03-15 DIAGNOSIS — I69391 Dysphagia following cerebral infarction: Secondary | ICD-10-CM | POA: Diagnosis not present

## 2019-03-15 DIAGNOSIS — Z9282 Status post administration of tPA (rtPA) in a different facility within the last 24 hours prior to admission to current facility: Secondary | ICD-10-CM | POA: Diagnosis not present

## 2019-03-15 DIAGNOSIS — R4701 Aphasia: Secondary | ICD-10-CM | POA: Diagnosis not present

## 2019-03-15 DIAGNOSIS — S82122D Displaced fracture of lateral condyle of left tibia, subsequent encounter for closed fracture with routine healing: Secondary | ICD-10-CM | POA: Diagnosis not present

## 2019-03-15 DIAGNOSIS — R1310 Dysphagia, unspecified: Secondary | ICD-10-CM | POA: Diagnosis not present

## 2019-03-15 DIAGNOSIS — R5383 Other fatigue: Secondary | ICD-10-CM | POA: Diagnosis not present

## 2019-03-15 DIAGNOSIS — R29714 NIHSS score 14: Secondary | ICD-10-CM | POA: Diagnosis not present

## 2019-03-15 DIAGNOSIS — G8194 Hemiplegia, unspecified affecting left nondominant side: Secondary | ICD-10-CM | POA: Diagnosis not present

## 2019-03-15 DIAGNOSIS — I509 Heart failure, unspecified: Secondary | ICD-10-CM | POA: Diagnosis not present

## 2019-03-15 DIAGNOSIS — S82242A Displaced spiral fracture of shaft of left tibia, initial encounter for closed fracture: Secondary | ICD-10-CM | POA: Diagnosis not present

## 2019-03-15 DIAGNOSIS — Z882 Allergy status to sulfonamides status: Secondary | ICD-10-CM | POA: Diagnosis not present

## 2019-03-15 DIAGNOSIS — Z8249 Family history of ischemic heart disease and other diseases of the circulatory system: Secondary | ICD-10-CM | POA: Diagnosis not present

## 2019-03-15 DIAGNOSIS — Z515 Encounter for palliative care: Secondary | ICD-10-CM | POA: Diagnosis not present

## 2019-03-15 DIAGNOSIS — N390 Urinary tract infection, site not specified: Secondary | ICD-10-CM | POA: Diagnosis not present

## 2019-03-15 DIAGNOSIS — I672 Cerebral atherosclerosis: Secondary | ICD-10-CM | POA: Diagnosis not present

## 2019-03-15 DIAGNOSIS — Z9181 History of falling: Secondary | ICD-10-CM | POA: Diagnosis not present

## 2019-03-15 DIAGNOSIS — R279 Unspecified lack of coordination: Secondary | ICD-10-CM | POA: Diagnosis not present

## 2019-03-15 DIAGNOSIS — Z5181 Encounter for therapeutic drug level monitoring: Secondary | ICD-10-CM | POA: Diagnosis not present

## 2019-03-15 DIAGNOSIS — Z006 Encounter for examination for normal comparison and control in clinical research program: Secondary | ICD-10-CM | POA: Diagnosis not present

## 2019-03-19 DIAGNOSIS — E782 Mixed hyperlipidemia: Secondary | ICD-10-CM | POA: Diagnosis not present

## 2019-03-19 DIAGNOSIS — I4891 Unspecified atrial fibrillation: Secondary | ICD-10-CM | POA: Diagnosis not present

## 2019-03-30 DIAGNOSIS — R000 Tachycardia, unspecified: Secondary | ICD-10-CM | POA: Diagnosis not present

## 2019-03-30 DIAGNOSIS — M79662 Pain in left lower leg: Secondary | ICD-10-CM | POA: Diagnosis not present

## 2019-03-30 DIAGNOSIS — W19XXXA Unspecified fall, initial encounter: Secondary | ICD-10-CM | POA: Diagnosis not present

## 2019-03-30 DIAGNOSIS — Z006 Encounter for examination for normal comparison and control in clinical research program: Secondary | ICD-10-CM | POA: Diagnosis not present

## 2019-03-30 DIAGNOSIS — G459 Transient cerebral ischemic attack, unspecified: Secondary | ICD-10-CM | POA: Diagnosis not present

## 2019-03-30 DIAGNOSIS — Y999 Unspecified external cause status: Secondary | ICD-10-CM | POA: Diagnosis not present

## 2019-03-30 DIAGNOSIS — M545 Low back pain: Secondary | ICD-10-CM | POA: Diagnosis not present

## 2019-03-30 DIAGNOSIS — R42 Dizziness and giddiness: Secondary | ICD-10-CM | POA: Diagnosis not present

## 2019-03-30 DIAGNOSIS — Z8673 Personal history of transient ischemic attack (TIA), and cerebral infarction without residual deficits: Secondary | ICD-10-CM | POA: Diagnosis not present

## 2019-03-30 DIAGNOSIS — M6281 Muscle weakness (generalized): Secondary | ICD-10-CM | POA: Diagnosis not present

## 2019-03-30 DIAGNOSIS — E785 Hyperlipidemia, unspecified: Secondary | ICD-10-CM | POA: Diagnosis not present

## 2019-03-30 DIAGNOSIS — I4891 Unspecified atrial fibrillation: Secondary | ICD-10-CM | POA: Diagnosis not present

## 2019-03-30 DIAGNOSIS — I639 Cerebral infarction, unspecified: Secondary | ICD-10-CM | POA: Diagnosis not present

## 2019-03-30 DIAGNOSIS — Z950 Presence of cardiac pacemaker: Secondary | ICD-10-CM | POA: Diagnosis not present

## 2019-03-30 DIAGNOSIS — Z743 Need for continuous supervision: Secondary | ICD-10-CM | POA: Diagnosis not present

## 2019-03-30 DIAGNOSIS — Z66 Do not resuscitate: Secondary | ICD-10-CM | POA: Diagnosis not present

## 2019-03-30 DIAGNOSIS — R197 Diarrhea, unspecified: Secondary | ICD-10-CM | POA: Diagnosis not present

## 2019-03-30 DIAGNOSIS — G8194 Hemiplegia, unspecified affecting left nondominant side: Secondary | ICD-10-CM | POA: Diagnosis not present

## 2019-03-30 DIAGNOSIS — U071 COVID-19: Secondary | ICD-10-CM | POA: Diagnosis not present

## 2019-03-30 DIAGNOSIS — R404 Transient alteration of awareness: Secondary | ICD-10-CM | POA: Diagnosis not present

## 2019-03-30 DIAGNOSIS — M353 Polymyalgia rheumatica: Secondary | ICD-10-CM | POA: Diagnosis not present

## 2019-03-30 DIAGNOSIS — I10 Essential (primary) hypertension: Secondary | ICD-10-CM | POA: Diagnosis not present

## 2019-03-30 DIAGNOSIS — R1084 Generalized abdominal pain: Secondary | ICD-10-CM | POA: Diagnosis not present

## 2019-03-30 DIAGNOSIS — S0990XA Unspecified injury of head, initial encounter: Secondary | ICD-10-CM | POA: Diagnosis not present

## 2019-03-30 DIAGNOSIS — Z9181 History of falling: Secondary | ICD-10-CM | POA: Diagnosis not present

## 2019-03-30 DIAGNOSIS — S82122D Displaced fracture of lateral condyle of left tibia, subsequent encounter for closed fracture with routine healing: Secondary | ICD-10-CM | POA: Diagnosis not present

## 2019-03-30 DIAGNOSIS — S82122A Displaced fracture of lateral condyle of left tibia, initial encounter for closed fracture: Secondary | ICD-10-CM | POA: Diagnosis not present

## 2019-03-30 DIAGNOSIS — D72829 Elevated white blood cell count, unspecified: Secondary | ICD-10-CM | POA: Diagnosis not present

## 2019-03-30 DIAGNOSIS — R110 Nausea: Secondary | ICD-10-CM | POA: Diagnosis not present

## 2019-03-30 DIAGNOSIS — Z7982 Long term (current) use of aspirin: Secondary | ICD-10-CM | POA: Diagnosis not present

## 2019-03-30 DIAGNOSIS — M542 Cervicalgia: Secondary | ICD-10-CM | POA: Diagnosis not present

## 2019-03-30 DIAGNOSIS — Y939 Activity, unspecified: Secondary | ICD-10-CM | POA: Diagnosis not present

## 2019-03-30 DIAGNOSIS — Z23 Encounter for immunization: Secondary | ICD-10-CM | POA: Diagnosis not present

## 2019-03-30 DIAGNOSIS — R001 Bradycardia, unspecified: Secondary | ICD-10-CM | POA: Diagnosis not present

## 2019-03-30 DIAGNOSIS — T460X1S Poisoning by cardiac-stimulant glycosides and drugs of similar action, accidental (unintentional), sequela: Secondary | ICD-10-CM | POA: Diagnosis not present

## 2019-03-30 DIAGNOSIS — H9193 Unspecified hearing loss, bilateral: Secondary | ICD-10-CM | POA: Diagnosis not present

## 2019-03-30 DIAGNOSIS — I63511 Cerebral infarction due to unspecified occlusion or stenosis of right middle cerebral artery: Secondary | ICD-10-CM | POA: Diagnosis not present

## 2019-03-30 DIAGNOSIS — I5042 Chronic combined systolic (congestive) and diastolic (congestive) heart failure: Secondary | ICD-10-CM | POA: Diagnosis not present

## 2019-03-30 DIAGNOSIS — R102 Pelvic and perineal pain: Secondary | ICD-10-CM | POA: Diagnosis not present

## 2019-03-30 DIAGNOSIS — R4182 Altered mental status, unspecified: Secondary | ICD-10-CM | POA: Diagnosis not present

## 2019-03-30 DIAGNOSIS — S72492D Other fracture of lower end of left femur, subsequent encounter for closed fracture with routine healing: Secondary | ICD-10-CM | POA: Diagnosis not present

## 2019-03-30 DIAGNOSIS — Y92122 Bedroom in nursing home as the place of occurrence of the external cause: Secondary | ICD-10-CM | POA: Diagnosis not present

## 2019-03-30 DIAGNOSIS — R278 Other lack of coordination: Secondary | ICD-10-CM | POA: Diagnosis not present

## 2019-03-30 DIAGNOSIS — I5032 Chronic diastolic (congestive) heart failure: Secondary | ICD-10-CM | POA: Diagnosis not present

## 2019-03-30 DIAGNOSIS — M25512 Pain in left shoulder: Secondary | ICD-10-CM | POA: Diagnosis not present

## 2019-03-30 DIAGNOSIS — Z515 Encounter for palliative care: Secondary | ICD-10-CM | POA: Diagnosis not present

## 2019-03-30 DIAGNOSIS — S199XXA Unspecified injury of neck, initial encounter: Secondary | ICD-10-CM | POA: Diagnosis not present

## 2019-03-30 DIAGNOSIS — W06XXXA Fall from bed, initial encounter: Secondary | ICD-10-CM | POA: Diagnosis not present

## 2019-03-30 DIAGNOSIS — J90 Pleural effusion, not elsewhere classified: Secondary | ICD-10-CM | POA: Diagnosis not present

## 2019-03-30 DIAGNOSIS — I482 Chronic atrial fibrillation, unspecified: Secondary | ICD-10-CM | POA: Diagnosis not present

## 2019-03-30 DIAGNOSIS — R531 Weakness: Secondary | ICD-10-CM | POA: Diagnosis not present

## 2019-03-30 DIAGNOSIS — I2510 Atherosclerotic heart disease of native coronary artery without angina pectoris: Secondary | ICD-10-CM | POA: Diagnosis not present

## 2019-03-30 DIAGNOSIS — I499 Cardiac arrhythmia, unspecified: Secondary | ICD-10-CM | POA: Diagnosis not present

## 2019-03-30 DIAGNOSIS — I63411 Cerebral infarction due to embolism of right middle cerebral artery: Secondary | ICD-10-CM | POA: Diagnosis not present

## 2019-03-30 DIAGNOSIS — J449 Chronic obstructive pulmonary disease, unspecified: Secondary | ICD-10-CM | POA: Diagnosis not present

## 2019-03-30 DIAGNOSIS — M2550 Pain in unspecified joint: Secondary | ICD-10-CM | POA: Diagnosis not present

## 2019-03-30 DIAGNOSIS — I447 Left bundle-branch block, unspecified: Secondary | ICD-10-CM | POA: Diagnosis not present

## 2019-03-30 DIAGNOSIS — R262 Difficulty in walking, not elsewhere classified: Secondary | ICD-10-CM | POA: Diagnosis not present

## 2019-03-30 DIAGNOSIS — R2689 Other abnormalities of gait and mobility: Secondary | ICD-10-CM | POA: Diagnosis not present

## 2019-03-30 DIAGNOSIS — Z79899 Other long term (current) drug therapy: Secondary | ICD-10-CM | POA: Diagnosis not present

## 2019-03-30 DIAGNOSIS — Z7901 Long term (current) use of anticoagulants: Secondary | ICD-10-CM | POA: Diagnosis not present

## 2019-03-30 DIAGNOSIS — Z7401 Bed confinement status: Secondary | ICD-10-CM | POA: Diagnosis not present

## 2019-03-30 DIAGNOSIS — R52 Pain, unspecified: Secondary | ICD-10-CM | POA: Diagnosis not present

## 2019-03-30 DIAGNOSIS — G9340 Encephalopathy, unspecified: Secondary | ICD-10-CM | POA: Diagnosis not present

## 2019-03-30 DIAGNOSIS — I69398 Other sequelae of cerebral infarction: Secondary | ICD-10-CM | POA: Diagnosis not present

## 2019-03-30 DIAGNOSIS — K5792 Diverticulitis of intestine, part unspecified, without perforation or abscess without bleeding: Secondary | ICD-10-CM | POA: Diagnosis not present

## 2019-03-30 DIAGNOSIS — Z7952 Long term (current) use of systemic steroids: Secondary | ICD-10-CM | POA: Diagnosis not present

## 2019-03-30 DIAGNOSIS — K589 Irritable bowel syndrome without diarrhea: Secondary | ICD-10-CM | POA: Diagnosis not present

## 2019-03-30 DIAGNOSIS — I69351 Hemiplegia and hemiparesis following cerebral infarction affecting right dominant side: Secondary | ICD-10-CM | POA: Diagnosis not present

## 2019-03-30 DIAGNOSIS — R0902 Hypoxemia: Secondary | ICD-10-CM | POA: Diagnosis not present

## 2019-03-30 DIAGNOSIS — R791 Abnormal coagulation profile: Secondary | ICD-10-CM | POA: Diagnosis not present

## 2019-03-30 DIAGNOSIS — I110 Hypertensive heart disease with heart failure: Secondary | ICD-10-CM | POA: Diagnosis not present

## 2019-03-30 DIAGNOSIS — N390 Urinary tract infection, site not specified: Secondary | ICD-10-CM | POA: Diagnosis not present

## 2019-03-30 DIAGNOSIS — R279 Unspecified lack of coordination: Secondary | ICD-10-CM | POA: Diagnosis not present

## 2019-03-30 DIAGNOSIS — D688 Other specified coagulation defects: Secondary | ICD-10-CM | POA: Diagnosis not present

## 2019-03-30 DIAGNOSIS — M546 Pain in thoracic spine: Secondary | ICD-10-CM | POA: Diagnosis not present

## 2019-03-30 DIAGNOSIS — J9811 Atelectasis: Secondary | ICD-10-CM | POA: Diagnosis not present

## 2019-03-30 DIAGNOSIS — Z20828 Contact with and (suspected) exposure to other viral communicable diseases: Secondary | ICD-10-CM | POA: Diagnosis not present

## 2019-03-30 DIAGNOSIS — R4701 Aphasia: Secondary | ICD-10-CM | POA: Diagnosis not present

## 2019-03-30 DIAGNOSIS — R109 Unspecified abdominal pain: Secondary | ICD-10-CM | POA: Diagnosis not present

## 2019-03-30 DIAGNOSIS — M7989 Other specified soft tissue disorders: Secondary | ICD-10-CM | POA: Diagnosis not present

## 2019-03-30 DIAGNOSIS — R4020 Unspecified coma: Secondary | ICD-10-CM | POA: Diagnosis not present

## 2019-03-30 DIAGNOSIS — I495 Sick sinus syndrome: Secondary | ICD-10-CM | POA: Diagnosis not present

## 2019-03-30 DIAGNOSIS — I4821 Permanent atrial fibrillation: Secondary | ICD-10-CM | POA: Diagnosis not present

## 2019-03-30 DIAGNOSIS — R410 Disorientation, unspecified: Secondary | ICD-10-CM | POA: Diagnosis not present

## 2019-03-30 DIAGNOSIS — Z5181 Encounter for therapeutic drug level monitoring: Secondary | ICD-10-CM | POA: Diagnosis not present

## 2019-03-30 DIAGNOSIS — M069 Rheumatoid arthritis, unspecified: Secondary | ICD-10-CM | POA: Diagnosis not present

## 2019-03-30 DIAGNOSIS — R7989 Other specified abnormal findings of blood chemistry: Secondary | ICD-10-CM | POA: Diagnosis not present

## 2019-03-31 DIAGNOSIS — S82122D Displaced fracture of lateral condyle of left tibia, subsequent encounter for closed fracture with routine healing: Secondary | ICD-10-CM | POA: Diagnosis not present

## 2019-03-31 DIAGNOSIS — R001 Bradycardia, unspecified: Secondary | ICD-10-CM | POA: Diagnosis not present

## 2019-03-31 DIAGNOSIS — I639 Cerebral infarction, unspecified: Secondary | ICD-10-CM | POA: Diagnosis not present

## 2019-03-31 DIAGNOSIS — I4891 Unspecified atrial fibrillation: Secondary | ICD-10-CM | POA: Diagnosis not present

## 2019-04-01 DIAGNOSIS — I639 Cerebral infarction, unspecified: Secondary | ICD-10-CM | POA: Diagnosis not present

## 2019-04-01 DIAGNOSIS — I4891 Unspecified atrial fibrillation: Secondary | ICD-10-CM | POA: Diagnosis not present

## 2019-04-01 DIAGNOSIS — S82122D Displaced fracture of lateral condyle of left tibia, subsequent encounter for closed fracture with routine healing: Secondary | ICD-10-CM | POA: Diagnosis not present

## 2019-04-01 DIAGNOSIS — R531 Weakness: Secondary | ICD-10-CM | POA: Diagnosis not present

## 2019-04-05 ENCOUNTER — Emergency Department (HOSPITAL_COMMUNITY)
Admission: EM | Admit: 2019-04-05 | Discharge: 2019-04-06 | Disposition: A | Discharge status: Home / Self Care | Payer: Medicare Other | Attending: Emergency Medicine | Admitting: Emergency Medicine

## 2019-04-05 ENCOUNTER — Emergency Department (HOSPITAL_COMMUNITY): Payer: Medicare Other

## 2019-04-05 ENCOUNTER — Encounter (HOSPITAL_COMMUNITY): Payer: Self-pay

## 2019-04-05 ENCOUNTER — Other Ambulatory Visit: Payer: Self-pay

## 2019-04-05 DIAGNOSIS — I639 Cerebral infarction, unspecified: Secondary | ICD-10-CM | POA: Diagnosis not present

## 2019-04-05 DIAGNOSIS — W06XXXA Fall from bed, initial encounter: Secondary | ICD-10-CM | POA: Insufficient documentation

## 2019-04-05 DIAGNOSIS — Y939 Activity, unspecified: Secondary | ICD-10-CM | POA: Diagnosis not present

## 2019-04-05 DIAGNOSIS — S72492D Other fracture of lower end of left femur, subsequent encounter for closed fracture with routine healing: Secondary | ICD-10-CM | POA: Diagnosis not present

## 2019-04-05 DIAGNOSIS — R410 Disorientation, unspecified: Secondary | ICD-10-CM | POA: Diagnosis not present

## 2019-04-05 DIAGNOSIS — S199XXA Unspecified injury of neck, initial encounter: Secondary | ICD-10-CM | POA: Diagnosis not present

## 2019-04-05 DIAGNOSIS — Z7901 Long term (current) use of anticoagulants: Secondary | ICD-10-CM | POA: Insufficient documentation

## 2019-04-05 DIAGNOSIS — R791 Abnormal coagulation profile: Secondary | ICD-10-CM | POA: Diagnosis not present

## 2019-04-05 DIAGNOSIS — R4182 Altered mental status, unspecified: Secondary | ICD-10-CM | POA: Diagnosis not present

## 2019-04-05 DIAGNOSIS — R531 Weakness: Secondary | ICD-10-CM | POA: Diagnosis not present

## 2019-04-05 DIAGNOSIS — S82122D Displaced fracture of lateral condyle of left tibia, subsequent encounter for closed fracture with routine healing: Secondary | ICD-10-CM | POA: Diagnosis not present

## 2019-04-05 DIAGNOSIS — I4891 Unspecified atrial fibrillation: Secondary | ICD-10-CM | POA: Diagnosis not present

## 2019-04-05 DIAGNOSIS — Z79899 Other long term (current) drug therapy: Secondary | ICD-10-CM | POA: Diagnosis not present

## 2019-04-05 DIAGNOSIS — I10 Essential (primary) hypertension: Secondary | ICD-10-CM | POA: Diagnosis not present

## 2019-04-05 DIAGNOSIS — Y999 Unspecified external cause status: Secondary | ICD-10-CM | POA: Insufficient documentation

## 2019-04-05 DIAGNOSIS — W19XXXA Unspecified fall, initial encounter: Secondary | ICD-10-CM

## 2019-04-05 DIAGNOSIS — D688 Other specified coagulation defects: Secondary | ICD-10-CM | POA: Diagnosis not present

## 2019-04-05 DIAGNOSIS — M069 Rheumatoid arthritis, unspecified: Secondary | ICD-10-CM | POA: Insufficient documentation

## 2019-04-05 DIAGNOSIS — Y92122 Bedroom in nursing home as the place of occurrence of the external cause: Secondary | ICD-10-CM | POA: Insufficient documentation

## 2019-04-05 DIAGNOSIS — S0990XA Unspecified injury of head, initial encounter: Secondary | ICD-10-CM | POA: Diagnosis not present

## 2019-04-05 LAB — CBC WITH DIFFERENTIAL/PLATELET
Abs Immature Granulocytes: 0.07 10*3/uL (ref 0.00–0.07)
Basophils Absolute: 0.1 10*3/uL (ref 0.0–0.1)
Basophils Relative: 1 %
Eosinophils Absolute: 0.1 10*3/uL (ref 0.0–0.5)
Eosinophils Relative: 1 %
HCT: 44.2 % (ref 36.0–46.0)
Hemoglobin: 13.7 g/dL (ref 12.0–15.0)
Immature Granulocytes: 1 %
Lymphocytes Relative: 9 %
Lymphs Abs: 1.2 10*3/uL (ref 0.7–4.0)
MCH: 30.9 pg (ref 26.0–34.0)
MCHC: 31 g/dL (ref 30.0–36.0)
MCV: 99.8 fL (ref 80.0–100.0)
Monocytes Absolute: 0.8 10*3/uL (ref 0.1–1.0)
Monocytes Relative: 6 %
Neutro Abs: 10.3 10*3/uL — ABNORMAL HIGH (ref 1.7–7.7)
Neutrophils Relative %: 82 %
Platelets: 432 10*3/uL — ABNORMAL HIGH (ref 150–400)
RBC: 4.43 MIL/uL (ref 3.87–5.11)
RDW: 15.6 % — ABNORMAL HIGH (ref 11.5–15.5)
WBC: 12.5 10*3/uL — ABNORMAL HIGH (ref 4.0–10.5)
nRBC: 0 % (ref 0.0–0.2)

## 2019-04-05 LAB — PROTIME-INR
INR: 5.1 (ref 0.8–1.2)
Prothrombin Time: 46 seconds — ABNORMAL HIGH (ref 11.4–15.2)

## 2019-04-05 LAB — URINALYSIS, ROUTINE W REFLEX MICROSCOPIC
Bacteria, UA: NONE SEEN
Bilirubin Urine: NEGATIVE
Glucose, UA: NEGATIVE mg/dL
Hgb urine dipstick: NEGATIVE
Ketones, ur: 5 mg/dL — AB
Leukocytes,Ua: NEGATIVE
Nitrite: NEGATIVE
Protein, ur: 30 mg/dL — AB
Specific Gravity, Urine: 1.017 (ref 1.005–1.030)
pH: 6 (ref 5.0–8.0)

## 2019-04-05 LAB — COMPREHENSIVE METABOLIC PANEL
ALT: 17 U/L (ref 0–44)
AST: 27 U/L (ref 15–41)
Albumin: 3.8 g/dL (ref 3.5–5.0)
Alkaline Phosphatase: 146 U/L — ABNORMAL HIGH (ref 38–126)
Anion gap: 16 — ABNORMAL HIGH (ref 5–15)
BUN: 11 mg/dL (ref 8–23)
CO2: 18 mmol/L — ABNORMAL LOW (ref 22–32)
Calcium: 8.9 mg/dL (ref 8.9–10.3)
Chloride: 106 mmol/L (ref 98–111)
Creatinine, Ser: 1.12 mg/dL — ABNORMAL HIGH (ref 0.44–1.00)
GFR calc Af Amer: 51 mL/min — ABNORMAL LOW (ref >60)
GFR calc non Af Amer: 44 mL/min — ABNORMAL LOW (ref >60)
Glucose, Bld: 127 mg/dL — ABNORMAL HIGH (ref 70–99)
Potassium: 4.2 mmol/L (ref 3.5–5.1)
Sodium: 140 mmol/L (ref 135–145)
Total Bilirubin: 1.2 mg/dL (ref 0.3–1.2)
Total Protein: 6.7 g/dL (ref 6.5–8.1)

## 2019-04-05 LAB — TROPONIN I (HIGH SENSITIVITY)
Troponin I (High Sensitivity): 46 ng/L — ABNORMAL HIGH (ref <18)
Troponin I (High Sensitivity): 40 ng/L — ABNORMAL HIGH (ref <18)

## 2019-04-05 LAB — CBG MONITORING, ED: Glucose-Capillary: 115 mg/dL — ABNORMAL HIGH (ref 70–99)

## 2019-04-05 LAB — AMMONIA: Ammonia: 15 umol/L (ref 9–35)

## 2019-04-05 MED ORDER — ACETAMINOPHEN 325 MG PO TABS
650.0000 mg | ORAL_TABLET | Freq: Once | ORAL | Status: DC
  Filled 2019-04-05: qty 2

## 2019-04-05 NOTE — ED Notes (Signed)
[facility] contacted for transport back to [facility]

## 2019-04-05 NOTE — ED Triage Notes (Signed)
Please call DaughterSheran Luz  @ [phone] . Daughter lives in [place] .

## 2019-04-05 NOTE — ED Notes (Signed)
EKG export program has not been working therefore the delay on the EKG. IT has been contacted.

## 2019-04-05 NOTE — ED Notes (Signed)
Pt has been placed on a purewick. Pt is aware a urine specimen is needed.

## 2019-04-05 NOTE — ED Triage Notes (Signed)
Per [facility] pt from [facility], per staff pt fell yesterday, they completed neuro checks. RN this am reports pt is normally confused however reports  increase confusion today. No N/V. Unsure of LKW, sometime 8/16 after fall. Pt is on a Coumadin.  BP 170-190'/80/90's, HR 90's A-fib with pacer, 92% RA

## 2019-04-05 NOTE — Discharge Instructions (Addendum)
You were seen in the emergency department for evaluation of injuries from a fall.  You had a CAT scan of your head and cervical spine, along with chest x-ray, leg xray, blood work EKG and a urinalysis.  We found your INR to be high at 5.1 and you will need to hold your warfarin for approximately 2 or 3 days.  It will be important for you to contact your neurologist at [facility] for close follow-up as there is some concern that you are having some more confusion.  Please follow her neuro checks at your facility.  Return to the emergency department if any concerns.

## 2019-04-05 NOTE — ED Provider Notes (Signed)
Assumed care of patient from Dr. Melina Copa at change of shift.  Patient is awaiting repeat troponin.  Troponin is unchanged, patient may be discharged per protocol and prior discharge planning.   Tacy Learn, PA-C 04/05/19 1947    Hayden Rasmussen, MD 04/06/19 [phone]

## 2019-04-05 NOTE — ED Notes (Signed)
Patient transported to CT 

## 2019-04-05 NOTE — ED Notes (Signed)
Attempted to call [employer]

## 2019-04-05 NOTE — ED Provider Notes (Signed)
[name] EMERGENCY DEPARTMENT Provider Note   CSN: [id number] Arrival date & time: 04/05/19  1216     History   Chief Complaint Chief Complaint  Patient presents with   Fall   Altered Mental Status    HPI [name] [name] is a 85 y.o. female.  Level 5 caveat secondary to altered mental status.  Patient is a history of A. fib on Coumadin.  On review of prior records it looks like she had a acute neurologic event about 3 weeks ago in which she had aphasia and left-sided weakness and received TPA.  She was at [facility] and also found to have a tibial condyle fracture.  She is currently now at rehab staff said they noticed she was a little more altered since yesterday.  She was found out of bed on the floor after a probable fall last evening and today staff found her less interactive and not speaking.  Patient here is alert and conversant following all commands and moving all extremities although has some weakness in her left leg.  She denies any complaints and does not recall any recent falls.  She is not oriented to time or place.     The history is provided by the patient and the EMS personnel.  Fall This is a new problem. The current episode started yesterday. The problem has been resolved. Pertinent negatives include no chest pain, no abdominal pain, no headaches and no shortness of breath. Nothing aggravates the symptoms. Nothing relieves the symptoms. She has tried nothing for the symptoms. The treatment provided no relief.  Altered Mental Status Presenting symptoms: disorientation   Severity:  Unable to specify Most recent episode:  Yesterday Context: recent illness   Associated symptoms: no abdominal pain, no difficulty breathing, no fever, no headaches, no rash and no vomiting     Past Medical History:  Diagnosis Date   Atrial fibrillation ([place])    On Coumadin   Coronary atherosclerosis of native coronary artery    Nonobstructive at cardiac  catheterization 2004   Depression    Dysphagia    Essential hypertension, benign    Hyperlipidemia    Polymyalgia rheumatica ([place])    Stroke [facility])     Patient Active Problem List   Diagnosis Date Noted   Atrial fibrillation with rapid ventricular response ([place]) 03/21/2018   Depression 03/21/2018   Chest pain 01/21/2018   Hypokalemia    Hypoxia    SOB (shortness of breath)    Acute bronchitis    Atypical chest pain 32/20/2542   Acute diastolic CHF (congestive heart failure) ([place]) 70/62/3762   Acute diastolic congestive heart failure ([place]) 12/29/2014   Dyspnea    Chronic diarrhea 07/04/2014   Coronary atherosclerosis of native coronary artery 03/28/2014   Long term (current) use of anticoagulants 11/26/2010   Hyperlipidemia 05/11/2007   Essential hypertension, benign 05/11/2007   Chronic atrial fibrillation ([place]) 05/11/2007   Polymyalgia rheumatica ([place]) 05/11/2007    Past Surgical History:  Procedure Laterality Date   APPENDECTOMY     COLONOSCOPY  2008   Dr.Fleshiman   Left ankle     TOE SURGERY     TOTAL ABDOMINAL HYSTERECTOMY       OB History    Gravida      Para      Term      Preterm      AB      Living  4     SAB  TAB      Ectopic      Multiple      Live Births               Home Medications    Prior to Admission medications   Medication Sig Start Date End Date Taking? Authorizing Provider  acetaminophen (TYLENOL) 500 MG tablet Take 500-1,000 mg by mouth daily as needed for moderate pain.     [provider]  atorvastatin (LIPITOR) 40 MG tablet Take 40 mg by mouth at bedtime.     [provider]  Biotin 1000 MCG tablet Take 1,000 mcg by mouth daily.    [provider]  Calcium Carbonate-Vitamin D (CALTRATE 600+D) 600-400 MG-UNIT per tablet Take 1 tablet by mouth daily.      [provider]  dicyclomine (BENTYL) 20 MG tablet Take 1 tablet by mouth 2 (two) times a day.  01/18/19   [provider]  diltiazem (CARDIZEM) 120 MG tablet Take 120 mg by mouth 2 (two) times daily.    [provider]  DULoxetine (CYMBALTA) 30 MG capsule Take 30 mg by mouth daily. 10/26/18   [provider]  escitalopram (LEXAPRO) 10 MG tablet Take 10 mg by mouth daily. 01/18/19   [provider]  famotidine (PEPCID) 20 MG tablet Take 1 tablet by mouth 2 (two) times daily. 01/18/19   [provider]  furosemide (LASIX) 40 MG tablet Take 40 mg by mouth daily.     [provider]  losartan (COZAAR) 50 MG tablet Take 50 mg by mouth every evening.     [provider]  metoprolol succinate (TOPROL-XL) 25 MG 24 hr tablet Take 3 tablets (75 mg total) by mouth at bedtime. 03/23/18 08/06/19  Johnson, Clanford L, MD  potassium chloride SA (K-DUR,KLOR-CON) 20 MEQ tablet TAKE ONE TABLET BY MOUTH EVERY DAY FOR LOW POTASSIUM 10/01/17   [provider]  predniSONE (DELTASONE) 5 MG tablet Take 10 mg by mouth daily.     [provider]  warfarin (COUMADIN) 4 MG tablet Take 2-4 mg by mouth See admin instructions. Patient takes 4mg  on one evening, then takes 2mg  for two evenings, then then takes 4mg , etc. (4mg , 2mg , 2mg , 4mg , etc) [facility]    [provider]    Family History Family History  Problem Relation Age of Onset   Stroke Mother    Hypertension Sister    Hypertension Brother    Rheum arthritis Daughter    Healthy Son    Healthy Son    Healthy Son    Hypertension Sister    Heart disease Sister    Heart disease Brother    Hypertension Brother    Arthritis Brother    Colon cancer Brother    Hypertension Brother    Coronary artery disease Other     Social History Social History   Tobacco Use   Smoking status: Never Smoker   Smokeless tobacco: Never Used  Substance Use Topics   Alcohol use: No    Alcohol/week: 0.0 standard drinks   Drug use: No     Allergies     Sulfonamide derivatives   Review of Systems Review of Systems  Constitutional: Negative for fever.  HENT: Negative for sore throat.   Eyes: Negative for visual disturbance.  Respiratory: Negative for shortness of breath.   Cardiovascular: Negative for chest pain.  Gastrointestinal: Negative for abdominal pain and vomiting.  Genitourinary: Negative for dysuria.  Musculoskeletal: Negative for neck pain.  Skin: Negative for rash.  Neurological: Negative for headaches.     Physical Exam Updated Vital Signs BP (!) 159/97 (BP Location: Right Arm)    Pulse 83    Temp 98.2 F (36.8 C) (Oral)    Resp (!) 26    SpO2 100%   Physical Exam Vitals signs and nursing note reviewed.  Constitutional:      General: She is not in acute distress.    Appearance: She is well-developed.  HENT:     Head: Normocephalic and atraumatic.  Eyes:     Conjunctiva/sclera: Conjunctivae normal.  Neck:     Musculoskeletal: Neck supple.  Cardiovascular:     Rate and Rhythm: Normal rate and regular rhythm.     Heart sounds: No murmur.  Pulmonary:     Effort: Pulmonary effort is normal. No respiratory distress.     Breath sounds: Normal breath sounds.  Abdominal:     Palpations: Abdomen is soft.     Tenderness: There is no abdominal tenderness.  Musculoskeletal: Normal range of motion.     Comments: lle in acewrap, distal n/v intact  Skin:    General: Skin is warm and dry.     Capillary Refill: Capillary refill takes less than 2 seconds.  Neurological:     Mental Status: She is alert. She is disoriented.     Motor: Weakness (let leg) present.      ED Treatments / Results  Labs (all labs ordered are listed, but only abnormal results are displayed) Labs Reviewed  CBC WITH DIFFERENTIAL/PLATELET - Abnormal; Notable for the following components:      Result Value   WBC 12.5 (*)    RDW 15.6 (*)    Platelets 432 (*)    Neutro Abs 10.3 (*)    All other components within normal limits   COMPREHENSIVE METABOLIC PANEL - Abnormal; Notable for the following components:   CO2 18 (*)    Glucose, Bld 127 (*)    Creatinine, Ser 1.12 (*)    Alkaline Phosphatase 146 (*)    GFR calc non Af Amer 44 (*)    GFR calc Af Amer 51 (*)    Anion gap 16 (*)    All other components within normal limits  URINALYSIS, ROUTINE W REFLEX MICROSCOPIC - Abnormal; Notable for the following components:   Ketones, ur 5 (*)    Protein, ur 30 (*)    All other components within normal limits  PROTIME-INR - Abnormal; Notable for the following components:   Prothrombin Time 46.0 (*)    INR 5.1 (*)    All other components within normal limits  CBG MONITORING, ED - Abnormal; Notable for the following components:   Glucose-Capillary 115 (*)    All other components within normal limits  TROPONIN I (HIGH SENSITIVITY) - Abnormal; Notable for the following components:   Troponin I (High Sensitivity) 46 (*)    All other components within normal limits  TROPONIN I (HIGH SENSITIVITY) - Abnormal; Notable for the following components:   Troponin I (High Sensitivity) 40 (*)    All other components within normal limits  AMMONIA    EKG EKG Interpretation  Date/Time:  Monday April 05 2019 14:44:00 EDT Ventricular Rate:  88 PR Interval:    QRS Duration: 97 QT Interval:  311 QTC Calculation: 377 R Axis:   48 Text Interpretation:  Atrial fibrillation Low voltage, extremity and precordial leads Anteroseptal infarct, old Repol abnrm, severe global ischemia (LM/MVD) Baseline wander in lead(s) V4  V5 V6 Partial missing lead(s): V4 V5 V6 Confirmed by Aletta Edouard [id number]) on 04/05/2019 2:49:49 PM   Radiology Dg Tibia/fibula Left  Result Date: 04/05/2019 CLINICAL DATA:  Acute left lower leg pain after fall yesterday. EXAM: LEFT TIBIA AND FIBULA - 2 VIEW COMPARISON:  Radiographs of August 12, 2017. FINDINGS: Status post surgical internal fixation of old distal left fibular and medial malleolar fractures. There  is been interval intramedullary rod fixation of minimally displaced spiral fracture involving the distal left femoral shaft. Mildly displaced oblique fracture is seen involving the proximal left fibula of indeterminate age. Vascular calcifications are noted. IMPRESSION: Status post intramedullary rod fixation of minimally displaced distal left femoral spiral fracture. Mildly displaced proximal left fibular fracture is noted of indeterminate age. Postsurgical changes are again noted in the distal left fibula and tibia. Electronically Signed   By: Marijo Conception M.D.   On: 04/05/2019 13:47   Ct Head Wo Contrast  Result Date: 04/05/2019 CLINICAL DATA:  Fall yesterday with increased confusion EXAM: CT HEAD WITHOUT CONTRAST CT CERVICAL SPINE WITHOUT CONTRAST TECHNIQUE: Multidetector CT imaging of the head and cervical spine was performed following the standard protocol without intravenous contrast. Multiplanar CT image reconstructions of the cervical spine were also generated. COMPARISON:  03/15/2019 FINDINGS: CT HEAD FINDINGS Brain: Changes consistent with prior left MCA infarct are noted. No acute hemorrhage, acute infarction or space-occupying mass lesion is seen. Mild atrophic changes and chronic white matter ischemic changes are noted as well stable from the prior exam. Vascular: No hyperdense vessel or unexpected calcification. Skull: Normal. Negative for fracture or focal lesion. Sinuses/Orbits: No acute finding. Other: None. CT CERVICAL SPINE FINDINGS Alignment: Mild loss of the normal cervical lordosis is noted. Skull base and vertebrae: 7 cervical segments are well visualized. Vertebral body height is well maintained. Mild facet hypertrophic changes are seen without acute abnormality. No fractures are noted. Soft tissues and spinal canal: Surrounding soft tissue structures demonstrate vascular calcifications. No acute soft tissue abnormality is noted. Upper chest: Visualized lung apices demonstrate some  mild pleural and parenchymal scarring. No acute infiltrate is noted. Other: None IMPRESSION: CT of the head: Chronic atrophic and ischemic changes with findings of prior left MCA infarct. CT of the cervical spine: Multilevel degenerative change without acute abnormality. Electronically Signed   By: Inez Catalina M.D.   On: 04/05/2019 14:29   Ct Cervical Spine Wo Contrast  Result Date: 04/05/2019 CLINICAL DATA:  Fall yesterday with increased confusion EXAM: CT HEAD WITHOUT CONTRAST CT CERVICAL SPINE WITHOUT CONTRAST TECHNIQUE: Multidetector CT imaging of the head and cervical spine was performed following the standard protocol without intravenous contrast. Multiplanar CT image reconstructions of the cervical spine were also generated. COMPARISON:  03/15/2019 FINDINGS: CT HEAD FINDINGS Brain: Changes consistent with prior left MCA infarct are noted. No acute hemorrhage, acute infarction or space-occupying mass lesion is seen. Mild atrophic changes and chronic white matter ischemic changes are noted as well stable from the prior exam. Vascular: No hyperdense vessel or unexpected calcification. Skull: Normal. Negative for fracture or focal lesion. Sinuses/Orbits: No acute finding. Other: None. CT CERVICAL SPINE FINDINGS Alignment: Mild loss of the normal cervical lordosis is noted. Skull base and vertebrae: 7 cervical segments are well visualized. Vertebral body height is well maintained. Mild facet hypertrophic changes are seen without acute abnormality. No fractures are noted. Soft tissues and spinal canal: Surrounding soft tissue structures demonstrate vascular calcifications. No acute soft tissue abnormality is noted. Upper chest: Visualized lung apices demonstrate  some mild pleural and parenchymal scarring. No acute infiltrate is noted. Other: None IMPRESSION: CT of the head: Chronic atrophic and ischemic changes with findings of prior left MCA infarct. CT of the cervical spine: Multilevel degenerative change  without acute abnormality. Electronically Signed   By: Inez Catalina M.D.   On: 04/05/2019 14:29   Dg Chest Port 1 View  Result Date: 04/05/2019 CLINICAL DATA:  Weakness. EXAM: PORTABLE CHEST 1 VIEW COMPARISON:  Radiographs of March 15, 2019. FINDINGS: Stable cardiomediastinal silhouette. No pneumothorax or pleural effusion is noted. Atherosclerosis of thoracic aorta is noted. Both lungs are clear. The visualized skeletal structures are unremarkable. IMPRESSION: No active disease. Aortic Atherosclerosis (ICD10-I70.0). Electronically Signed   By: Marijo Conception M.D.   On: 04/05/2019 13:44    Procedures Procedures (including critical care time)  Medications Ordered in ED Medications - No data to display   Initial Impression / Assessment and Plan / ED Course  I have reviewed the triage vital signs and the nursing notes.  Pertinent labs & imaging results that were available during my care of the patient were reviewed by me and considered in my medical decision making (see chart for details).  Clinical Course as of Apr 06 855  Mon Apr 05, 2019  1249 Rather unclear story of a fall yesterday on Coumadin and change in mental status from a patient who has some ongoing dementia at baseline.  Patient was reportedly nonverbal but is talking here and has no complaints.  She is following commands.   [MB]  4536 Patient's chest x-ray does not show any acute disease.  Tib-fib shows an IM nail and also an ORIF of here her fibula with hardware intact.  Awaiting radiology readings.   [MB]  4680 Patient's head CT and C-spine CT do not show any gross abnormalities but awaiting radiology reading.   [MB]  1435 CT showing prior findings of her MCA infarct but no acute findings and C-spine showing DJD.   [MB]  1450 Patient's EKG just got down and she was in A. fib but there is diffuse ST depressions that is worse than her prior EKG August 2018.  We will send off a troponin.   [MB]  81 Family member patient is  here now.  They thought she might have a urine infection because he said she is been a little more confused.  Otherwise it sounds like she is kind of a baseline right now.    [MB]  1803 Nurse was able to in and out catheterize the patient for a urine   [MB]  1804 Patient will need follow-up on the urinalysis and a delta troponin.  I think if the troponin is not rising she can be safely discharged back to the nursing facility plus minus antibiotics based on the urinalysis.   [MB]    Clinical Course User Index [MB] Hayden Rasmussen, MD       Final Clinical Impressions(s) / ED Diagnoses   Final diagnoses:  Fall, initial encounter  Confusion  Supratherapeutic INR    ED Discharge Orders    None       Hayden Rasmussen, MD 04/06/19 [phone]

## 2019-04-06 NOTE — ED Notes (Signed)
[facility] facility contacted and given report of discharge instructions. Opportunity for questioning and answers were provided. Armband removed by staff, pt discharged from ED by [facility].

## 2019-04-07 ENCOUNTER — Emergency Department (HOSPITAL_COMMUNITY): Payer: Medicare Other

## 2019-04-07 ENCOUNTER — Encounter (HOSPITAL_COMMUNITY): Payer: Self-pay

## 2019-04-07 ENCOUNTER — Other Ambulatory Visit: Payer: Self-pay

## 2019-04-07 ENCOUNTER — Emergency Department (HOSPITAL_COMMUNITY)
Admission: EM | Admit: 2019-04-07 | Discharge: 2019-04-07 | Disposition: A | Discharge status: Home / Self Care | Payer: Medicare Other | Attending: Emergency Medicine | Admitting: Emergency Medicine

## 2019-04-07 DIAGNOSIS — M353 Polymyalgia rheumatica: Secondary | ICD-10-CM | POA: Diagnosis not present

## 2019-04-07 DIAGNOSIS — R4020 Unspecified coma: Secondary | ICD-10-CM | POA: Diagnosis not present

## 2019-04-07 DIAGNOSIS — I10 Essential (primary) hypertension: Secondary | ICD-10-CM | POA: Diagnosis not present

## 2019-04-07 DIAGNOSIS — I639 Cerebral infarction, unspecified: Secondary | ICD-10-CM | POA: Diagnosis not present

## 2019-04-07 DIAGNOSIS — Z8673 Personal history of transient ischemic attack (TIA), and cerebral infarction without residual deficits: Secondary | ICD-10-CM | POA: Diagnosis not present

## 2019-04-07 DIAGNOSIS — I4891 Unspecified atrial fibrillation: Secondary | ICD-10-CM | POA: Diagnosis not present

## 2019-04-07 DIAGNOSIS — Z79899 Other long term (current) drug therapy: Secondary | ICD-10-CM | POA: Diagnosis not present

## 2019-04-07 DIAGNOSIS — Z7901 Long term (current) use of anticoagulants: Secondary | ICD-10-CM | POA: Diagnosis not present

## 2019-04-07 DIAGNOSIS — R4182 Altered mental status, unspecified: Secondary | ICD-10-CM

## 2019-04-07 DIAGNOSIS — S82122D Displaced fracture of lateral condyle of left tibia, subsequent encounter for closed fracture with routine healing: Secondary | ICD-10-CM | POA: Diagnosis not present

## 2019-04-07 LAB — CBC WITH DIFFERENTIAL/PLATELET
Abs Immature Granulocytes: 0.06 10*3/uL (ref 0.00–0.07)
Basophils Absolute: 0.1 10*3/uL (ref 0.0–0.1)
Basophils Relative: 1 %
Eosinophils Absolute: 0.1 10*3/uL (ref 0.0–0.5)
Eosinophils Relative: 1 %
HCT: 40.6 % (ref 36.0–46.0)
Hemoglobin: 13.1 g/dL (ref 12.0–15.0)
Immature Granulocytes: 1 %
Lymphocytes Relative: 11 %
Lymphs Abs: 1.3 10*3/uL (ref 0.7–4.0)
MCH: 30.6 pg (ref 26.0–34.0)
MCHC: 32.3 g/dL (ref 30.0–36.0)
MCV: 94.9 fL (ref 80.0–100.0)
Monocytes Absolute: 1.1 10*3/uL — ABNORMAL HIGH (ref 0.1–1.0)
Monocytes Relative: 9 %
Neutro Abs: 9.7 10*3/uL — ABNORMAL HIGH (ref 1.7–7.7)
Neutrophils Relative %: 77 %
Platelets: 417 10*3/uL — ABNORMAL HIGH (ref 150–400)
RBC: 4.28 MIL/uL (ref 3.87–5.11)
RDW: 15.7 % — ABNORMAL HIGH (ref 11.5–15.5)
WBC: 12.3 10*3/uL — ABNORMAL HIGH (ref 4.0–10.5)
nRBC: 0 % (ref 0.0–0.2)

## 2019-04-07 LAB — TROPONIN I (HIGH SENSITIVITY): Troponin I (High Sensitivity): 57 ng/L — ABNORMAL HIGH (ref <18)

## 2019-04-07 LAB — CBG MONITORING, ED: Glucose-Capillary: 112 mg/dL — ABNORMAL HIGH (ref 70–99)

## 2019-04-07 LAB — URINALYSIS, ROUTINE W REFLEX MICROSCOPIC
Bilirubin Urine: NEGATIVE
Glucose, UA: NEGATIVE mg/dL
Hgb urine dipstick: NEGATIVE
Ketones, ur: 5 mg/dL — AB
Leukocytes,Ua: NEGATIVE
Nitrite: NEGATIVE
Protein, ur: NEGATIVE mg/dL
Specific Gravity, Urine: 1.016 (ref 1.005–1.030)
pH: 5 (ref 5.0–8.0)

## 2019-04-07 LAB — BASIC METABOLIC PANEL
Anion gap: 13 (ref 5–15)
BUN: 11 mg/dL (ref 8–23)
CO2: 22 mmol/L (ref 22–32)
Calcium: 8.9 mg/dL (ref 8.9–10.3)
Chloride: 105 mmol/L (ref 98–111)
Creatinine, Ser: 1.05 mg/dL — ABNORMAL HIGH (ref 0.44–1.00)
GFR calc Af Amer: 55 mL/min — ABNORMAL LOW (ref >60)
GFR calc non Af Amer: 48 mL/min — ABNORMAL LOW (ref >60)
Glucose, Bld: 127 mg/dL — ABNORMAL HIGH (ref 70–99)
Potassium: 3.5 mmol/L (ref 3.5–5.1)
Sodium: 140 mmol/L (ref 135–145)

## 2019-04-07 LAB — PROTIME-INR
INR: 6.2 (ref 0.8–1.2)
Prothrombin Time: 53.8 seconds — ABNORMAL HIGH (ref 11.4–15.2)

## 2019-04-07 LAB — AMMONIA: Ammonia: 13 umol/L (ref 9–35)

## 2019-04-07 MED ORDER — SODIUM CHLORIDE 0.9 % IV BOLUS
500.0000 mL | Freq: Once | INTRAVENOUS | Status: AC
  Administered 2019-04-07: 500 mL via INTRAVENOUS

## 2019-04-07 NOTE — ED Provider Notes (Signed)
[facility] EMERGENCY DEPARTMENT Provider Note   CSN: [id number] Arrival date & time: 04/07/19  1050     History   Chief Complaint Chief Complaint  Patient presents with   Altered Mental Status    HPI [name] [name] is a 85 y.o. female.     Patient is an 85 year old female with past medical history of atrial fibrillation on Coumadin, polymyalgia, hypertension, depression, prior CVA.  She is sent from her extended care facility for evaluation of altered mental status.  According to the nursing home staff, the patient has been more confused and disoriented over the past several days.  She was apparently seen here after a fall 2 days ago.  Work-up at that time was unremarkable and the patient discharged home.  Since then she has been more confused, agitated, and not carrying on conversations that she normally does.  Patient denies she is experiencing any discomfort.  She denies any other complaints.  The history is provided by the patient.  Altered Mental Status Presenting symptoms: confusion and disorientation   Severity:  Moderate Most recent episode:  More than 2 days ago Episode history:  Continuous Timing:  Constant Progression:  Worsening Chronicity:  New Context: not dementia     Past Medical History:  Diagnosis Date   Atrial fibrillation ([place])    On Coumadin   Coronary atherosclerosis of native coronary artery    Nonobstructive at cardiac catheterization 2004   Depression    Dysphagia    Essential hypertension, benign    Hyperlipidemia    Polymyalgia rheumatica ([place])    Stroke [facility])     Patient Active Problem List   Diagnosis Date Noted   Atrial fibrillation with rapid ventricular response ([place]) 03/21/2018   Depression 03/21/2018   Chest pain 01/21/2018   Hypokalemia    Hypoxia    SOB (shortness of breath)    Acute bronchitis    Atypical chest pain 07/37/1062   Acute diastolic CHF (congestive heart failure) ([place])  69/48/5462   Acute diastolic congestive heart failure ([place]) 12/29/2014   Dyspnea    Chronic diarrhea 07/04/2014   Coronary atherosclerosis of native coronary artery 03/28/2014   Long term (current) use of anticoagulants 11/26/2010   Hyperlipidemia 05/11/2007   Essential hypertension, benign 05/11/2007   Chronic atrial fibrillation ([place]) 05/11/2007   Polymyalgia rheumatica ([place]) 05/11/2007    Past Surgical History:  Procedure Laterality Date   APPENDECTOMY     COLONOSCOPY  2008   Dr.Fleshiman   Left ankle     TOE SURGERY     TOTAL ABDOMINAL HYSTERECTOMY       OB History    Gravida      Para      Term      Preterm      AB      Living  4     SAB      TAB      Ectopic      Multiple      Live Births               Home Medications    Prior to Admission medications   Medication Sig Start Date End Date Taking? Authorizing Provider  acetaminophen (TYLENOL) 500 MG tablet Take 500-1,000 mg by mouth daily as needed for moderate pain.     [provider]  atorvastatin (LIPITOR) 40 MG tablet Take 40 mg by mouth at bedtime.     [provider]  Biotin 1000 MCG  tablet Take 1,000 mcg by mouth daily.    [provider]  Calcium Carbonate-Vitamin D (CALTRATE 600+D) 600-400 MG-UNIT per tablet Take 1 tablet by mouth daily.      [provider]  dicyclomine (BENTYL) 20 MG tablet Take 1 tablet by mouth 2 (two) times a day. 01/18/19   [provider]  diltiazem (CARDIZEM) 120 MG tablet Take 120 mg by mouth 2 (two) times daily.    [provider]  DULoxetine (CYMBALTA) 30 MG capsule Take 30 mg by mouth daily. 10/26/18   [provider]  escitalopram (LEXAPRO) 10 MG tablet Take 10 mg by mouth daily. 01/18/19   [provider]  famotidine (PEPCID) 20 MG tablet Take 1 tablet by mouth 2 (two) times daily. 01/18/19   [provider]  furosemide (LASIX) 40 MG tablet Take 40 mg by mouth daily.      [provider]  losartan (COZAAR) 50 MG tablet Take 50 mg by mouth every evening.     [provider]  metoprolol succinate (TOPROL-XL) 25 MG 24 hr tablet Take 3 tablets (75 mg total) by mouth at bedtime. 03/23/18 08/06/19  Johnson, Clanford L, MD  potassium chloride SA (K-DUR,KLOR-CON) 20 MEQ tablet TAKE ONE TABLET BY MOUTH EVERY DAY FOR LOW POTASSIUM 10/01/17   [provider]  predniSONE (DELTASONE) 5 MG tablet Take 10 mg by mouth daily.     [provider]  warfarin (COUMADIN) 4 MG tablet Take 2-4 mg by mouth See admin instructions. Patient takes 4mg  on one evening, then takes 2mg  for two evenings, then then takes 4mg , etc. (4mg , 2mg , 2mg , 4mg , etc) [place] [facility]    [provider]    Family History Family History  Problem Relation Age of Onset   Stroke Mother    Hypertension Sister    Hypertension Brother    Rheum arthritis Daughter    Healthy Son    Healthy Son    Healthy Son    Hypertension Sister    Heart disease Sister    Heart disease Brother    Hypertension Brother    Arthritis Brother    Colon cancer Brother    Hypertension Brother    Coronary artery disease Other     Social History Social History   Tobacco Use   Smoking status: Never Smoker   Smokeless tobacco: Never Used  Substance Use Topics   Alcohol use: No    Alcohol/week: 0.0 standard drinks   Drug use: No     Allergies   Sulfonamide derivatives   Review of Systems Review of Systems  Psychiatric/Behavioral: Positive for confusion.  All other systems reviewed and are negative.    Physical Exam Updated Vital Signs BP (!) 110/96    Pulse 61    Resp 17    SpO2 96%   Physical Exam Vitals signs and nursing note reviewed.  Constitutional:      General: She is not in acute distress.    Appearance: She is well-developed. She is not diaphoretic.  HENT:     Head: Normocephalic and atraumatic.  Neck:     Musculoskeletal: Normal  range of motion and neck supple.  Cardiovascular:     Rate and Rhythm: Normal rate and regular rhythm.     Heart sounds: No murmur. No friction rub. No gallop.   Pulmonary:     Effort: Pulmonary effort is normal. No respiratory distress.     Breath sounds: Normal breath sounds. No wheezing.  Abdominal:  General: Bowel sounds are normal. There is no distension.     Palpations: Abdomen is soft.     Tenderness: There is no abdominal tenderness.  Musculoskeletal: Normal range of motion.  Skin:    General: Skin is warm and dry.  Neurological:     General: No focal deficit present.     Mental Status: She is alert and oriented to person, place, and time.     Cranial Nerves: No cranial nerve deficit.     Sensory: No sensory deficit.     Motor: No weakness.     Coordination: Coordination normal.     Comments: Patient awake and alert.  Her speech is somewhat slurred and occasionally difficult to comprehend.  Her responses to questions are sometimes inappropriate.      ED Treatments / Results  Labs (all labs ordered are listed, but only abnormal results are displayed) Labs Reviewed  CBG MONITORING, ED - Abnormal; Notable for the following components:      Result Value   Glucose-Capillary 112 (*)    All other components within normal limits  BASIC METABOLIC PANEL  CBC WITH DIFFERENTIAL/PLATELET  URINALYSIS, ROUTINE W REFLEX MICROSCOPIC  PROTIME-INR    EKG EKG Interpretation  Date/Time:  Wednesday April 07 2019 10:53:16 EDT Ventricular Rate:  94 PR Interval:    QRS Duration: 92 QT Interval:  332 QTC Calculation: 416 R Axis:   73 Text Interpretation:  Atrial fibrillation Ventricular premature complex Probable anterior infarct, age indeterminate Repol abnrm, severe global ischemia (LM/MVD) Confirmed by Veryl Speak [phone]) on 04/07/2019 11:52:55 AM   Radiology Dg Tibia/fibula Left  Result Date: 04/05/2019 CLINICAL DATA:  Acute left lower leg pain after fall yesterday.  EXAM: LEFT TIBIA AND FIBULA - 2 VIEW COMPARISON:  Radiographs of August 12, 2017. FINDINGS: Status post surgical internal fixation of old distal left fibular and medial malleolar fractures. There is been interval intramedullary rod fixation of minimally displaced spiral fracture involving the distal left femoral shaft. Mildly displaced oblique fracture is seen involving the proximal left fibula of indeterminate age. Vascular calcifications are noted. IMPRESSION: Status post intramedullary rod fixation of minimally displaced distal left femoral spiral fracture. Mildly displaced proximal left fibular fracture is noted of indeterminate age. Postsurgical changes are again noted in the distal left fibula and tibia. Electronically Signed   By: Marijo Conception M.D.   On: 04/05/2019 13:47   Ct Head Wo Contrast  Result Date: 04/05/2019 CLINICAL DATA:  Fall yesterday with increased confusion EXAM: CT HEAD WITHOUT CONTRAST CT CERVICAL SPINE WITHOUT CONTRAST TECHNIQUE: Multidetector CT imaging of the head and cervical spine was performed following the standard protocol without intravenous contrast. Multiplanar CT image reconstructions of the cervical spine were also generated. COMPARISON:  03/15/2019 FINDINGS: CT HEAD FINDINGS Brain: Changes consistent with prior left MCA infarct are noted. No acute hemorrhage, acute infarction or space-occupying mass lesion is seen. Mild atrophic changes and chronic white matter ischemic changes are noted as well stable from the prior exam. Vascular: No hyperdense vessel or unexpected calcification. Skull: Normal. Negative for fracture or focal lesion. Sinuses/Orbits: No acute finding. Other: None. CT CERVICAL SPINE FINDINGS Alignment: Mild loss of the normal cervical lordosis is noted. Skull base and vertebrae: 7 cervical segments are well visualized. Vertebral body height is well maintained. Mild facet hypertrophic changes are seen without acute abnormality. No fractures are noted.  Soft tissues and spinal canal: Surrounding soft tissue structures demonstrate vascular calcifications. No acute soft tissue abnormality is noted. Upper chest:  Visualized lung apices demonstrate some mild pleural and parenchymal scarring. No acute infiltrate is noted. Other: None IMPRESSION: CT of the head: Chronic atrophic and ischemic changes with findings of prior left MCA infarct. CT of the cervical spine: Multilevel degenerative change without acute abnormality. Electronically Signed   By: Inez Catalina M.D.   On: 04/05/2019 14:29   Ct Cervical Spine Wo Contrast  Result Date: 04/05/2019 CLINICAL DATA:  Fall yesterday with increased confusion EXAM: CT HEAD WITHOUT CONTRAST CT CERVICAL SPINE WITHOUT CONTRAST TECHNIQUE: Multidetector CT imaging of the head and cervical spine was performed following the standard protocol without intravenous contrast. Multiplanar CT image reconstructions of the cervical spine were also generated. COMPARISON:  03/15/2019 FINDINGS: CT HEAD FINDINGS Brain: Changes consistent with prior left MCA infarct are noted. No acute hemorrhage, acute infarction or space-occupying mass lesion is seen. Mild atrophic changes and chronic white matter ischemic changes are noted as well stable from the prior exam. Vascular: No hyperdense vessel or unexpected calcification. Skull: Normal. Negative for fracture or focal lesion. Sinuses/Orbits: No acute finding. Other: None. CT CERVICAL SPINE FINDINGS Alignment: Mild loss of the normal cervical lordosis is noted. Skull base and vertebrae: 7 cervical segments are well visualized. Vertebral body height is well maintained. Mild facet hypertrophic changes are seen without acute abnormality. No fractures are noted. Soft tissues and spinal canal: Surrounding soft tissue structures demonstrate vascular calcifications. No acute soft tissue abnormality is noted. Upper chest: Visualized lung apices demonstrate some mild pleural and parenchymal scarring. No acute  infiltrate is noted. Other: None IMPRESSION: CT of the head: Chronic atrophic and ischemic changes with findings of prior left MCA infarct. CT of the cervical spine: Multilevel degenerative change without acute abnormality. Electronically Signed   By: Inez Catalina M.D.   On: 04/05/2019 14:29   Dg Chest Port 1 View  Result Date: 04/05/2019 CLINICAL DATA:  Weakness. EXAM: PORTABLE CHEST 1 VIEW COMPARISON:  Radiographs of March 15, 2019. FINDINGS: Stable cardiomediastinal silhouette. No pneumothorax or pleural effusion is noted. Atherosclerosis of thoracic aorta is noted. Both lungs are clear. The visualized skeletal structures are unremarkable. IMPRESSION: No active disease. Aortic Atherosclerosis (ICD10-I70.0). Electronically Signed   By: Marijo Conception M.D.   On: 04/05/2019 13:44    Procedures Procedures (including critical care time)  Medications Ordered in ED Medications  sodium chloride 0.9 % bolus 500 mL (has no administration in time range)     Initial Impression / Assessment and Plan / ED Course  I have reviewed the triage vital signs and the nursing notes.  Pertinent labs & imaging results that were available during my care of the patient were reviewed by me and considered in my medical decision making (see chart for details).  Patient is an 85 year old female with history of recent admission at [facility] for CVA, leg injury, and pacemaker placement.  She was sent to rehab postoperatively and has been more confused and agitated there.  She was sent here today for altered mental status.  Patient does appear somewhat confused and her speech is somewhat slurred.  Her work-up shows no medical reason for for her confusion.  I suspect alterations in her sleep patterns, living arrangements, and prior hospital procedures/medications are the reason for her confusion.  I discussed the care with the patient's daughter [name] [name] and son who was present at bedside.  The son tells me that she actually  seems much better today than she did 2 days ago.  There was some discussion about possibly  having her go to hospice, however the son does not want to pursue this given the fact that he feels she looks better.  At this point, I see no indication for her to be admitted.  Patient will be returned to her rehab facility and is to return as needed.  Final Clinical Impressions(s) / ED Diagnoses   Final diagnoses:  None    ED Discharge Orders    None       Veryl Speak, MD 04/07/19 1752

## 2019-04-07 NOTE — ED Notes (Signed)
Advised Dr. Stark Jock lactic acid needs re-drawn d/t lab misplacing first tube.  V.O. by Dr. Stark Jock to cancel lactic acid.

## 2019-04-07 NOTE — ED Triage Notes (Signed)
From [facility] via ems for ams x 1 day; mc ed Monday for same; per staff, pt had become increasing altered; pt normally has some confusion at baseline, but is able to carry on conversation and follow commands; pt currently unable to do either of these things; per staff, pt's INR was elevated; afib on monitor, hx of same, has pacemaker; denies pain, c/o nausea  158/84 HR 94 100% RA 139 CBG  RR 18

## 2019-04-07 NOTE — ED Notes (Signed)
Pt waiting for transport back to [facility].  Report called to facility

## 2019-04-07 NOTE — ED Notes (Signed)
Lab called back, lactic acid tube found discarded.  [name] [name] will cancel out order and lab needs redrawn.

## 2019-04-07 NOTE — ED Notes (Signed)
[facility] her for transport. All belongings including glasses sent with pt.

## 2019-04-07 NOTE — Discharge Instructions (Signed)
Continue medications as before.  Return to the emergency department for high fever, difficulty breathing, severe headache, or other new and concerning symptoms.

## 2019-04-07 NOTE — ED Notes (Signed)
Contacted pts daughter Izola  per request from pts husband.  Daughter stated that her brother is on the way to ED, wants to discuss pt condition and possibility of being admitted to palliative care, discharged to [facility].

## 2019-04-07 NOTE — ED Notes (Signed)
Pt covered in feces. Cleaned up and barrier cream applied. Changed purewick.

## 2019-04-07 NOTE — ED Notes (Signed)
[facility] CALLED PT IS NUMBER [phone]  ON THE LIST 

## 2019-04-07 NOTE — ED Notes (Addendum)
Lab called and stated they did not receive the lactic acid tube.  Advised it was sent down on ice with ammonia tube.

## 2019-04-09 ENCOUNTER — Other Ambulatory Visit: Payer: Self-pay | Admitting: *Deleted

## 2019-04-12 DIAGNOSIS — S82122D Displaced fracture of lateral condyle of left tibia, subsequent encounter for closed fracture with routine healing: Secondary | ICD-10-CM | POA: Diagnosis not present

## 2019-04-12 DIAGNOSIS — R001 Bradycardia, unspecified: Secondary | ICD-10-CM | POA: Diagnosis not present

## 2019-04-12 DIAGNOSIS — I4891 Unspecified atrial fibrillation: Secondary | ICD-10-CM | POA: Diagnosis not present

## 2019-04-12 DIAGNOSIS — I639 Cerebral infarction, unspecified: Secondary | ICD-10-CM | POA: Diagnosis not present

## 2019-04-13 DIAGNOSIS — D72829 Elevated white blood cell count, unspecified: Secondary | ICD-10-CM | POA: Diagnosis not present

## 2019-04-13 DIAGNOSIS — I10 Essential (primary) hypertension: Secondary | ICD-10-CM | POA: Diagnosis not present

## 2019-04-13 DIAGNOSIS — R197 Diarrhea, unspecified: Secondary | ICD-10-CM | POA: Diagnosis not present

## 2019-04-13 DIAGNOSIS — G9340 Encephalopathy, unspecified: Secondary | ICD-10-CM | POA: Diagnosis not present

## 2019-04-14 NOTE — Patient Outreach (Signed)
Member assessed for potential [facility] Care Management needs as a benefit of  [place] Medicare.  Member is currently receiving rehab therapy at [facility].  Will follow for potential [facility] Care Management needs, disposition plans, and progression.   Will collaborate with [facility] UM team and facility dc planner on member while at SNF.    Marthenia Rolling, MSN-Ed, RN,BSN [place] Acute Care Coordinator [phone] [facility]) [phone]  (Toll free office)

## 2019-04-20 ENCOUNTER — Other Ambulatory Visit: Payer: Self-pay | Admitting: *Deleted

## 2019-04-20 DIAGNOSIS — Z950 Presence of cardiac pacemaker: Secondary | ICD-10-CM | POA: Diagnosis not present

## 2019-04-20 DIAGNOSIS — Z515 Encounter for palliative care: Secondary | ICD-10-CM | POA: Diagnosis not present

## 2019-04-20 DIAGNOSIS — I4891 Unspecified atrial fibrillation: Secondary | ICD-10-CM | POA: Diagnosis not present

## 2019-04-20 DIAGNOSIS — I495 Sick sinus syndrome: Secondary | ICD-10-CM | POA: Diagnosis not present

## 2019-04-20 NOTE — Patient Outreach (Signed)
Member assessed for potential [facility] Care Management needs as a benefit of  [name] Medicare.  Member is currently receiving rehab therapy at [facility].  Member discussed in weekly telephonic IDT meeting with facility staff, [facility] UM RN, and writer.  Facility discharge planner reports disposition plans are for Mrs. [name] [name] to return home with husband. States cognition is improving. Medication adjustments have been made to digoxin and coumadin. Cardiology appointment is scheduled for today.   Discussed writer is following for potential [facility] Care Management needs. Writer advised that member's daughter, [name] [name] at [phone], is primary contact regarding disposition plans [facility] Care Management discussion.   Will plan outreach closer to SNF discharge. Will continue to collaborate with [facility] UM team and facility on member while she residing in SNF.    Marthenia Rolling, MSN-Ed, RN,BSN [place] Acute Care Coordinator [phone] [facility]) [phone]  (Toll free office)

## 2019-04-21 DIAGNOSIS — R7989 Other specified abnormal findings of blood chemistry: Secondary | ICD-10-CM | POA: Diagnosis not present

## 2019-04-21 DIAGNOSIS — I4891 Unspecified atrial fibrillation: Secondary | ICD-10-CM | POA: Diagnosis not present

## 2019-04-21 DIAGNOSIS — R001 Bradycardia, unspecified: Secondary | ICD-10-CM | POA: Diagnosis not present

## 2019-04-21 DIAGNOSIS — G9340 Encephalopathy, unspecified: Secondary | ICD-10-CM | POA: Diagnosis not present

## 2019-04-23 DIAGNOSIS — R102 Pelvic and perineal pain: Secondary | ICD-10-CM | POA: Diagnosis not present

## 2019-04-23 DIAGNOSIS — M542 Cervicalgia: Secondary | ICD-10-CM | POA: Diagnosis not present

## 2019-04-23 DIAGNOSIS — M545 Low back pain: Secondary | ICD-10-CM | POA: Diagnosis not present

## 2019-04-23 DIAGNOSIS — M546 Pain in thoracic spine: Secondary | ICD-10-CM | POA: Diagnosis not present

## 2019-04-27 ENCOUNTER — Other Ambulatory Visit: Payer: Self-pay | Admitting: *Deleted

## 2019-04-27 NOTE — Patient Outreach (Signed)
Member assessed for potential [facility] Care Management needs as a benefit of  [place] Medicare.  Member is currently receiving rehab therapy at [facility].  Member discussed in weekly telephonic IDT meeting with facility staff, [facility] UM RN, and writer.  Facility reports [name] [name]' INR has been fluctuating, she is lethargic, and is regressing with therapy. She is requiring max assistance. [facility] UM RN inquired whether facility MD has evaluated member's condition. Facility also reports they plan on discussing long term care with member's daughter. Writer suggested possible palliative care consult. Member lived with husband prior to admit.  Will continue to follow for disposition plans, progression, and for potential [facility] Care Management needs while in SNF.   Marthenia Rolling, MSN-Ed, RN,BSN [place] Acute Care Coordinator [phone] [facility]) [phone]  (Toll free office)

## 2019-04-28 DIAGNOSIS — S82122D Displaced fracture of lateral condyle of left tibia, subsequent encounter for closed fracture with routine healing: Secondary | ICD-10-CM | POA: Diagnosis not present

## 2019-04-28 DIAGNOSIS — R001 Bradycardia, unspecified: Secondary | ICD-10-CM | POA: Diagnosis not present

## 2019-04-28 DIAGNOSIS — R110 Nausea: Secondary | ICD-10-CM | POA: Diagnosis not present

## 2019-04-28 DIAGNOSIS — R109 Unspecified abdominal pain: Secondary | ICD-10-CM | POA: Diagnosis not present

## 2019-04-28 DIAGNOSIS — N390 Urinary tract infection, site not specified: Secondary | ICD-10-CM | POA: Diagnosis not present

## 2019-04-29 DIAGNOSIS — M353 Polymyalgia rheumatica: Secondary | ICD-10-CM | POA: Diagnosis not present

## 2019-04-29 DIAGNOSIS — R001 Bradycardia, unspecified: Secondary | ICD-10-CM | POA: Diagnosis not present

## 2019-04-29 DIAGNOSIS — S82122D Displaced fracture of lateral condyle of left tibia, subsequent encounter for closed fracture with routine healing: Secondary | ICD-10-CM | POA: Diagnosis not present

## 2019-04-29 DIAGNOSIS — I639 Cerebral infarction, unspecified: Secondary | ICD-10-CM | POA: Diagnosis not present

## 2019-05-06 ENCOUNTER — Ambulatory Visit (INDEPENDENT_AMBULATORY_CARE_PROVIDER_SITE_OTHER): Payer: Self-pay | Admitting: Adult Health

## 2019-05-06 ENCOUNTER — Other Ambulatory Visit: Payer: Self-pay

## 2019-05-06 ENCOUNTER — Encounter: Payer: Self-pay | Admitting: Adult Health

## 2019-05-06 VITALS — BP 139/73 | HR 77 | Temp 98.1°F

## 2019-05-06 DIAGNOSIS — Z8673 Personal history of transient ischemic attack (TIA), and cerebral infarction without residual deficits: Secondary | ICD-10-CM

## 2019-05-06 NOTE — Progress Notes (Signed)
Ms. Dingmann presented to [facility] today assisted by facility staff.  It was recommended for her to follow-up 6 months after prior visit in 01/2018 but returning today for undetermined reasons.  She continues to reside at [facility] accompanied by facility staff who is not familiar with patient. With attempts of history taking, observation of severe cognitive impairment and confusion with responses to questions are inappropriate and at times difficult to understand speech.  Recent ED admission with altered mental status but unable to determine if this is patient's new baseline.  Questionable hospice placement with recent admission but apparently son refused.  Due to inability to obtain history taking, difficulty with assessment and reason for visit undetermined, this visit will be no charge and advised facility staff that if facility provider wanted her to be seen for a specific reason, to call office for potential need of rescheduling visit.

## 2019-05-06 NOTE — Patient Instructions (Signed)
Your Plan:  Unable to fully determine exact reason for patient's visit today.     Thank you for coming to see [place] at [facility]. I hope we have been able to provide you high quality care today.  You may receive a patient satisfaction survey over the next few weeks. We would appreciate your feedback and comments so that we may continue to improve ourselves and the health of our patients.

## 2019-05-06 NOTE — Progress Notes (Signed)
I agree with the above plan 

## 2019-05-13 ENCOUNTER — Emergency Department (HOSPITAL_COMMUNITY): Payer: Medicare Other

## 2019-05-13 ENCOUNTER — Emergency Department (HOSPITAL_COMMUNITY)
Admission: EM | Admit: 2019-05-13 | Discharge: 2019-05-13 | Disposition: A | Discharge status: Home / Self Care | Payer: Medicare Other | Attending: Emergency Medicine | Admitting: Emergency Medicine

## 2019-05-13 ENCOUNTER — Encounter (HOSPITAL_COMMUNITY): Payer: Self-pay

## 2019-05-13 DIAGNOSIS — Z7982 Long term (current) use of aspirin: Secondary | ICD-10-CM | POA: Insufficient documentation

## 2019-05-13 DIAGNOSIS — Z8673 Personal history of transient ischemic attack (TIA), and cerebral infarction without residual deficits: Secondary | ICD-10-CM | POA: Diagnosis not present

## 2019-05-13 DIAGNOSIS — I2510 Atherosclerotic heart disease of native coronary artery without angina pectoris: Secondary | ICD-10-CM | POA: Insufficient documentation

## 2019-05-13 DIAGNOSIS — Z7901 Long term (current) use of anticoagulants: Secondary | ICD-10-CM | POA: Diagnosis not present

## 2019-05-13 DIAGNOSIS — J90 Pleural effusion, not elsewhere classified: Secondary | ICD-10-CM | POA: Diagnosis not present

## 2019-05-13 DIAGNOSIS — I5032 Chronic diastolic (congestive) heart failure: Secondary | ICD-10-CM | POA: Diagnosis not present

## 2019-05-13 DIAGNOSIS — Z79899 Other long term (current) drug therapy: Secondary | ICD-10-CM | POA: Diagnosis not present

## 2019-05-13 DIAGNOSIS — R531 Weakness: Secondary | ICD-10-CM | POA: Insufficient documentation

## 2019-05-13 DIAGNOSIS — I110 Hypertensive heart disease with heart failure: Secondary | ICD-10-CM | POA: Insufficient documentation

## 2019-05-13 DIAGNOSIS — R4182 Altered mental status, unspecified: Secondary | ICD-10-CM | POA: Diagnosis not present

## 2019-05-13 DIAGNOSIS — N390 Urinary tract infection, site not specified: Secondary | ICD-10-CM | POA: Insufficient documentation

## 2019-05-13 DIAGNOSIS — J9811 Atelectasis: Secondary | ICD-10-CM | POA: Diagnosis not present

## 2019-05-13 LAB — BASIC METABOLIC PANEL
Anion gap: 13 (ref 5–15)
BUN: 21 mg/dL (ref 8–23)
CO2: 16 mmol/L — ABNORMAL LOW (ref 22–32)
Calcium: 8.9 mg/dL (ref 8.9–10.3)
Chloride: 107 mmol/L (ref 98–111)
Creatinine, Ser: 1.1 mg/dL — ABNORMAL HIGH (ref 0.44–1.00)
GFR calc Af Amer: 52 mL/min — ABNORMAL LOW (ref >60)
GFR calc non Af Amer: 45 mL/min — ABNORMAL LOW (ref >60)
Glucose, Bld: 121 mg/dL — ABNORMAL HIGH (ref 70–99)
Potassium: 4.3 mmol/L (ref 3.5–5.1)
Sodium: 136 mmol/L (ref 135–145)

## 2019-05-13 LAB — CBC WITH DIFFERENTIAL/PLATELET
Abs Immature Granulocytes: 0.1 10*3/uL — ABNORMAL HIGH (ref 0.00–0.07)
Basophils Absolute: 0.1 10*3/uL (ref 0.0–0.1)
Basophils Relative: 1 %
Eosinophils Absolute: 0 10*3/uL (ref 0.0–0.5)
Eosinophils Relative: 0 %
HCT: 44.6 % (ref 36.0–46.0)
Hemoglobin: 14.3 g/dL (ref 12.0–15.0)
Immature Granulocytes: 1 %
Lymphocytes Relative: 16 %
Lymphs Abs: 1.9 10*3/uL (ref 0.7–4.0)
MCH: 30.5 pg (ref 26.0–34.0)
MCHC: 32.1 g/dL (ref 30.0–36.0)
MCV: 95.1 fL (ref 80.0–100.0)
Monocytes Absolute: 1.3 10*3/uL — ABNORMAL HIGH (ref 0.1–1.0)
Monocytes Relative: 11 %
Neutro Abs: 8.4 10*3/uL — ABNORMAL HIGH (ref 1.7–7.7)
Neutrophils Relative %: 71 %
Platelets: 374 10*3/uL (ref 150–400)
RBC: 4.69 MIL/uL (ref 3.87–5.11)
RDW: 15.9 % — ABNORMAL HIGH (ref 11.5–15.5)
WBC: 11.9 10*3/uL — ABNORMAL HIGH (ref 4.0–10.5)
nRBC: 0 % (ref 0.0–0.2)

## 2019-05-13 LAB — URINALYSIS, ROUTINE W REFLEX MICROSCOPIC
Bilirubin Urine: NEGATIVE
Glucose, UA: NEGATIVE mg/dL
Ketones, ur: NEGATIVE mg/dL
Nitrite: POSITIVE — AB
Protein, ur: NEGATIVE mg/dL
Specific Gravity, Urine: 1.018 (ref 1.005–1.030)
pH: 5 (ref 5.0–8.0)

## 2019-05-13 LAB — HEPATIC FUNCTION PANEL
ALT: 26 U/L (ref 0–44)
AST: 38 U/L (ref 15–41)
Albumin: 3.5 g/dL (ref 3.5–5.0)
Alkaline Phosphatase: 148 U/L — ABNORMAL HIGH (ref 38–126)
Bilirubin, Direct: 0.3 mg/dL — ABNORMAL HIGH (ref 0.0–0.2)
Indirect Bilirubin: 0.9 mg/dL (ref 0.3–0.9)
Total Bilirubin: 1.2 mg/dL (ref 0.3–1.2)
Total Protein: 7 g/dL (ref 6.5–8.1)

## 2019-05-13 LAB — PROTIME-INR
INR: 1.3 — ABNORMAL HIGH (ref 0.8–1.2)
Prothrombin Time: 16.4 seconds — ABNORMAL HIGH (ref 11.4–15.2)

## 2019-05-13 LAB — TROPONIN I (HIGH SENSITIVITY)
Troponin I (High Sensitivity): 29 ng/L — ABNORMAL HIGH (ref <18)
Troponin I (High Sensitivity): 30 ng/L — ABNORMAL HIGH (ref <18)

## 2019-05-13 LAB — CBG MONITORING, ED: Glucose-Capillary: 128 mg/dL — ABNORMAL HIGH (ref 70–99)

## 2019-05-13 MED ORDER — SODIUM CHLORIDE 0.9 % IV BOLUS
500.0000 mL | Freq: Once | INTRAVENOUS | Status: AC
  Administered 2019-05-13: 500 mL via INTRAVENOUS

## 2019-05-13 MED ORDER — CEPHALEXIN 500 MG PO CAPS: 500.0000 mg | ORAL_CAPSULE | Freq: Four times a day (QID) | ORAL | 0 refills | Status: DC

## 2019-05-13 MED ORDER — CEPHALEXIN 250 MG PO CAPS
1000.0000 mg | ORAL_CAPSULE | Freq: Once | ORAL | Status: AC
  Administered 2019-05-13: 1000 mg via ORAL
  Filled 2019-05-13: qty 4

## 2019-05-13 NOTE — ED Notes (Signed)
Pt returns from ct  

## 2019-05-13 NOTE — ED Provider Notes (Signed)
4:14 PM Assumed care from Dr. Ronnald Nian, please see their note for full history, physical and decision making until this point. In brief this is a 85 y.o. year old female who presented to the ED tonight with Altered Mental Status     Patient here with L facial droop. Exam not c/w stroke. Already on all appropriate antiplatelets and anticoagulants. Exam appears well. POA doesn't want large workup for possible TIA if she's back to neurologic baseline now (she is). Plan for UA/repeat troponin to ensure stability. Dc on appropriate meds with close outpatient followup.   Patient with UTI. Will treat for same.   Discharge instructions, including strict return precautions for new or worsening symptoms, given. Patient and/or family verbalized understanding and agreement with the plan as described.   Labs, studies and imaging reviewed by myself and considered in medical decision making if ordered. Imaging interpreted by radiology.  Labs Reviewed  CBC WITH DIFFERENTIAL/PLATELET - Abnormal; Notable for the following components:      Result Value   WBC 11.9 (*)    RDW 15.9 (*)    Neutro Abs 8.4 (*)    Monocytes Absolute 1.3 (*)    Abs Immature Granulocytes 0.10 (*)    All other components within normal limits  BASIC METABOLIC PANEL - Abnormal; Notable for the following components:   CO2 16 (*)    Glucose, Bld 121 (*)    Creatinine, Ser 1.10 (*)    GFR calc non Af Amer 45 (*)    GFR calc Af Amer 52 (*)    All other components within normal limits  HEPATIC FUNCTION PANEL - Abnormal; Notable for the following components:   Alkaline Phosphatase 148 (*)    Bilirubin, Direct 0.3 (*)    All other components within normal limits  PROTIME-INR - Abnormal; Notable for the following components:   Prothrombin Time 16.4 (*)    INR 1.3 (*)    All other components within normal limits  URINALYSIS, ROUTINE W REFLEX MICROSCOPIC - Abnormal; Notable for the following components:   Color, Urine AMBER (*)    Hgb  urine dipstick SMALL (*)    Nitrite POSITIVE (*)    Leukocytes,Ua MODERATE (*)    Bacteria, UA MANY (*)    All other components within normal limits  CBG MONITORING, ED - Abnormal; Notable for the following components:   Glucose-Capillary 128 (*)    All other components within normal limits  TROPONIN I (HIGH SENSITIVITY) - Abnormal; Notable for the following components:   Troponin I (High Sensitivity) 29 (*)    All other components within normal limits  TROPONIN I (HIGH SENSITIVITY) - Abnormal; Notable for the following components:   Troponin I (High Sensitivity) 30 (*)    All other components within normal limits  URINE CULTURE    CT Head Wo Contrast  Final Result    DG Chest Portable 1 View  Final Result      No follow-ups on file.    Merrily Pew, MD 05/13/19 [phone]

## 2019-05-13 NOTE — ED Notes (Signed)
[facility] called for transport.  

## 2019-05-13 NOTE — ED Triage Notes (Signed)
Pt arrives  EMS from [facility] where staff states pt has stroke like symptoms including facial droop, left sided weakness and slurred speech.  Unknown lasty seen normal as staff states pt has had several days of "feeling poorly" Pt arrives T[place] as did not wish to leave nursing home and cognitive impairment at baseline.

## 2019-05-13 NOTE — ED Provider Notes (Signed)
[facility] EMERGENCY DEPARTMENT Provider Note   CSN: [id number] Arrival date & time: 05/13/19  1219     History   Chief Complaint Chief Complaint  Patient presents with   Altered Mental Status    HPI [name] [name] is a 85 y.o. female.     The history is provided by the patient and a caregiver.  Altered Mental Status Presenting symptoms: lethargy   Presenting symptoms comment:  Maybe left sided facial droop per daughter.  Severity:  Mild Most recent episode: unknown, per EMS, patient not doing well the last few days. Episode history:  Unable to specify Timing:  Unable to specify Progression:  Unable to specify Chronicity:  Recurrent Context: dementia and nursing home resident   Associated symptoms: nausea, slurred speech and weakness   Associated symptoms: no abdominal pain, no fever, no palpitations, no rash, no seizures and no vomiting     Past Medical History:  Diagnosis Date   Atrial fibrillation ([place])    On Coumadin   Coronary atherosclerosis of native coronary artery    Nonobstructive at cardiac catheterization 2004   Depression    Dysphagia    Essential hypertension, benign    Hyperlipidemia    Polymyalgia rheumatica ([place])    Stroke [facility])     Patient Active Problem List   Diagnosis Date Noted   Atrial fibrillation with rapid ventricular response ([place]) 03/21/2018   Depression 03/21/2018   Chest pain 01/21/2018   Hypokalemia    Hypoxia    SOB (shortness of breath)    Acute bronchitis    Atypical chest pain 99991111   Acute diastolic CHF (congestive heart failure) ([place]) 123XX123   Acute diastolic congestive heart failure ([place]) 12/29/2014   Dyspnea    Chronic diarrhea 07/04/2014   Coronary atherosclerosis of native coronary artery 03/28/2014   Long term (current) use of anticoagulants 11/26/2010   Hyperlipidemia 05/11/2007   Essential hypertension, benign 05/11/2007   Chronic atrial fibrillation  ([place]) 05/11/2007   Polymyalgia rheumatica ([place]) 05/11/2007    Past Surgical History:  Procedure Laterality Date   APPENDECTOMY     COLONOSCOPY  2008   Dr.Fleshiman   Left ankle     TOE SURGERY     TOTAL ABDOMINAL HYSTERECTOMY       OB History    Gravida      Para      Term      Preterm      AB      Living  4     SAB      TAB      Ectopic      Multiple      Live Births               Home Medications    Prior to Admission medications   Medication Sig Start Date End Date Taking? Authorizing Provider  acetaminophen (TYLENOL) 500 MG tablet Take 500-1,000 mg by mouth daily as needed for moderate pain.     [provider]  amLODipine (NORVASC) 5 MG tablet Take by mouth.    [provider]  apixaban (ELIQUIS) 5 MG TABS tablet Take 5 mg by mouth 2 (two) times daily.    [provider]  aspirin EC 81 MG tablet Take 81 mg by mouth daily. OTC    [provider]  atorvastatin (LIPITOR) 40 MG tablet Take 40 mg by mouth at bedtime.     [provider]  Biotin 1000 MCG tablet  Take 1,000 mcg by mouth daily.    [provider]  Calcium Carbonate-Vitamin D (CALTRATE 600+D) 600-400 MG-UNIT per tablet Take 1 tablet by mouth daily.      [provider]  dicyclomine (BENTYL) 20 MG tablet Take 1 tablet by mouth 2 (two) times a day. 01/18/19   [provider]  digoxin (LANOXIN) 0.125 MG tablet Take 1 mcg by mouth daily. Chronic A-fib    [provider]  diltiazem (CARDIZEM) 120 MG tablet Take 120 mg by mouth 2 (two) times daily.    [provider]  DULoxetine (CYMBALTA) 30 MG capsule Take 30 mg by mouth daily. 10/26/18   [provider]  escitalopram (LEXAPRO) 10 MG tablet Take 10 mg by mouth daily. 01/18/19   [provider]  famotidine (PEPCID) 20 MG tablet Take 1 tablet by mouth 2 (two) times daily. 01/18/19   [provider]  furosemide (LASIX) 40 MG tablet Take  40 mg by mouth daily.     [provider]  losartan (COZAAR) 50 MG tablet Take 50 mg by mouth every evening.     [provider]  metoprolol succinate (TOPROL-XL) 25 MG 24 hr tablet Take 3 tablets (75 mg total) by mouth at bedtime. 03/23/18 08/06/19  Johnson, Clanford L, MD  ondansetron (ZOFRAN) 4 MG tablet Take 4 mg by mouth every 8 (eight) hours as needed for nausea or vomiting.    [provider]  potassium chloride (KLOR-CON) 20 MEQ packet Take by mouth 2 (two) times daily.    [provider]  potassium chloride SA (K-DUR,KLOR-CON) 20 MEQ tablet TAKE ONE TABLET BY MOUTH EVERY DAY FOR LOW POTASSIUM 10/01/17   [provider]  predniSONE (DELTASONE) 5 MG tablet Take 10 mg by mouth daily.     [provider]  warfarin (COUMADIN) 4 MG tablet Take 2-4 mg by mouth See admin instructions. Patient takes 4mg  on one evening, then takes 2mg  for two evenings, then then takes 4mg , etc. (4mg , 2mg , 2mg , 4mg , etc) [place] [facility]    [provider]    Family History Family History  Problem Relation Age of Onset   Stroke Mother    Hypertension Sister    Hypertension Brother    Rheum arthritis Daughter    Healthy Son    Healthy Son    Healthy Son    Hypertension Sister    Heart disease Sister    Heart disease Brother    Hypertension Brother    Arthritis Brother    Colon cancer Brother    Hypertension Brother    Coronary artery disease Other     Social History Social History   Tobacco Use   Smoking status: Never Smoker   Smokeless tobacco: Never Used  Substance Use Topics   Alcohol use: No    Alcohol/week: 0.0 standard drinks   Drug use: No     Allergies   Sulfa antibiotics and Sulfonamide derivatives   Review of Systems Review of Systems  Constitutional: Negative for chills and fever.  HENT: Negative for ear pain and sore throat.   Eyes: Negative for pain and visual disturbance.  Respiratory:  Negative for cough and shortness of breath.   Cardiovascular: Negative for chest pain and palpitations.  Gastrointestinal: Positive for nausea. Negative for abdominal pain and vomiting.  Genitourinary: Positive for dysuria. Negative for hematuria.  Musculoskeletal: Negative for arthralgias and back pain.  Skin: Negative for color change and rash.  Neurological: Positive for facial asymmetry (resolved?),  speech difficulty and weakness. Negative for seizures and syncope.  All other systems reviewed and are negative.    Physical Exam Updated Vital Signs  ED Triage Vitals  Enc Vitals Group     BP 05/13/19 1202 (!) 187/80     Pulse Rate 05/13/19 1202 60     Resp 05/13/19 1202 16     Temp 05/13/19 1202 97.8 F (36.6 C)     Temp Source 05/13/19 1202 Oral     SpO2 05/13/19 1201 96 %     Weight --      Height --      Head Circumference --      Peak Flow --      Pain Score 05/13/19 1207 0     Pain Loc --      Pain Edu? --      Excl. in [place]? --     Physical Exam Vitals signs and nursing note reviewed.  Constitutional:      General: She is not in acute distress.    Appearance: She is well-developed. She is not ill-appearing.  HENT:     Head: Normocephalic and atraumatic.     Mouth/Throat:     Mouth: Mucous membranes are moist.  Eyes:     Extraocular Movements: Extraocular movements intact.     Conjunctiva/sclera: Conjunctivae normal.     Pupils: Pupils are equal, round, and reactive to light.  Neck:     Musculoskeletal: Normal range of motion and neck supple.  Cardiovascular:     Rate and Rhythm: Normal rate and regular rhythm.     Pulses: Normal pulses.     Heart sounds: Normal heart sounds. No murmur.  Pulmonary:     Effort: Pulmonary effort is normal. No respiratory distress.     Breath sounds: Normal breath sounds.  Abdominal:     Palpations: Abdomen is soft.     Tenderness: There is no abdominal tenderness.  Musculoskeletal: Normal range of motion.        General:  No tenderness.  Skin:    General: Skin is warm and dry.  Neurological:     General: No focal deficit present.     Mental Status: She is alert and oriented to person, place, and time.     Cranial Nerves: No cranial nerve deficit.     Sensory: No sensory deficit.     Motor: No weakness.     Coordination: Coordination normal.     Comments: 5+ out of 5 strength throughout, normal sensation, normal speech, no facial droop, no drift, normal finger-to-nose finger, normal speech  Psychiatric:        Mood and Affect: Mood normal.      ED Treatments / Results  Labs (all labs ordered are listed, but only abnormal results are displayed) Labs Reviewed  CBC WITH DIFFERENTIAL/PLATELET - Abnormal; Notable for the following components:      Result Value   WBC 11.9 (*)    RDW 15.9 (*)    Neutro Abs 8.4 (*)    Monocytes Absolute 1.3 (*)    Abs Immature Granulocytes 0.10 (*)    All other components within normal limits  BASIC METABOLIC PANEL - Abnormal; Notable for the following components:   CO2 16 (*)    Glucose, Bld 121 (*)    Creatinine, Ser 1.10 (*)    GFR calc non Af Amer 45 (*)    GFR calc Af Amer 52 (*)    All other components within normal limits  HEPATIC FUNCTION PANEL - Abnormal; Notable for the following components:   Alkaline Phosphatase 148 (*)    Bilirubin, Direct 0.3 (*)    All other components within normal limits  PROTIME-INR - Abnormal; Notable for the following components:   Prothrombin Time 16.4 (*)    INR 1.3 (*)    All other components within normal limits  URINALYSIS, ROUTINE W REFLEX MICROSCOPIC - Abnormal; Notable for the following components:   Color, Urine AMBER (*)    Hgb urine dipstick SMALL (*)    Nitrite POSITIVE (*)    Leukocytes,Ua MODERATE (*)    Bacteria, UA MANY (*)    All other components within normal limits  CBG MONITORING, ED - Abnormal; Notable for the following components:   Glucose-Capillary 128 (*)    All other components within normal  limits  TROPONIN I (HIGH SENSITIVITY) - Abnormal; Notable for the following components:   Troponin I (High Sensitivity) 29 (*)    All other components within normal limits  URINE CULTURE  TROPONIN I (HIGH SENSITIVITY)    EKG EKG Interpretation  Date/Time:  Thursday May 13 2019 12:31:29 EDT Ventricular Rate:  96 PR Interval:    QRS Duration: 101 QT Interval:  325 QTC Calculation: 411 R Axis:   33 Text Interpretation:  Atrial fibrillation Low voltage, precordial leads Repol abnrm, severe global ischemia (LM/MVD) unchanged from prior EKG with global ischemic changes Confirmed by Lennice Sites [phone]) on 05/13/2019 12:36:32 PM   Radiology Ct Head Wo Contrast  Result Date: 05/13/2019 CLINICAL DATA:  Altered mental status. EXAM: CT HEAD WITHOUT CONTRAST TECHNIQUE: Contiguous axial images were obtained from the base of the skull through the vertex without intravenous contrast. COMPARISON:  CT head dated April 07, 2019. FINDINGS: Brain: No evidence of acute infarction, hemorrhage, hydrocephalus, extra-axial collection or mass lesion/mass effect. Old left frontoparietal infarct. Stable moderate atrophy and chronic microvascular ischemic changes. Vascular: Calcified atherosclerosis at the skullbase. No hyperdense vessel. Skull: Normal. Negative for fracture or focal lesion. Sinuses/Orbits: No acute finding. Other: None. IMPRESSION: 1.  No acute intracranial abnormality. 2. Stable moderate atrophy and chronic microvascular ischemic changes. Old left MCA territory infarct. Electronically Signed   By: Titus Dubin M.D.   On: 05/13/2019 15:28   Dg Chest Portable 1 View  Result Date: 05/13/2019 CLINICAL DATA:  Facial droop, slurred speech. EXAM: PORTABLE CHEST 1 VIEW COMPARISON:  Radiograph of April 07, 2019. FINDINGS: The heart size and mediastinal contours are within normal limits. No pneumothorax is noted. Right lung is clear. Minimal left basilar subsegmental atelectasis is noted with  minimal left pleural effusion. The visualized skeletal structures are unremarkable. IMPRESSION: Minimal left basilar subsegmental atelectasis is noted with minimal left pleural effusion. Electronically Signed   By: Marijo Conception M.D.   On: 05/13/2019 13:23    Procedures Procedures (including critical care time)  Medications Ordered in ED Medications  sodium chloride 0.9 % bolus 500 mL (0 mLs Intravenous Stopped 05/13/19 1558)     Initial Impression / Assessment and Plan / ED Course  I have reviewed the triage vital signs and the nursing notes.  Pertinent labs & imaging results that were available during my care of the patient were reviewed by me and considered in my medical decision making (see chart for details).     [name] [name] is an 85 year old female history of stroke, CAD, atrial fibrillation now on Eliquis who presents the ED with altered mental status.  Patient with overall unremarkable vitals.  No  fever.  Patient per nursing home and EMS with some slight generalized weakness may be over the last several days.  Possibly had some facial droop at some point today or yesterday but there is no clear timeline for any acute stroke symptoms.  Patient on my examination has no acute neurological findings.  No facial droop.  Normal speech.  No drift.  She appears neurologically stable and at her baseline.  Talked with family member on the phone who states that patient has some right-sided symptoms with her first stroke.  Talk to family member on the phone who is the power of attorney about how she appears to have normal neurological exam.  It is possible that she might of had some neurological symptoms prior to arrival but appears to have resolved if it did exist.  Patient is on Eliquis for her atrial fibrillation.  EKG did show some ST depressions but this is unchanged from prior EKG.  Troponin mildly elevated but appears to have chronic troponin elevation.  Discussed with Dr. Olivia Mackie with  cardiology on the phone and state that we can check a second troponin and if there is no significant change, patient would be cleared from a cardiac standpoint.  She does not have any chest pain on exam.  Patient does not appear to be volume overloaded.  Lab work overall shows no significant anemia, electrolyte abnormality, kidney injury.  Awaiting urinalysis and repeat troponin.  CT scan of the head showed no acute findings.  Chest x-ray showed no infectious process.  I discussed pursuing an MRI/TIA work-up with family but shared decision was made to not pursue this as patient is a DNR, already on blood thinners and stroke prevention medications.  Overall however I have low suspicion that patient had true neurological symptoms.  She has a history of dementia and overall appears to be at her baseline.  She is pleasant.  No fever.  Power of attorney feels comfortable with discharge back to facility pending repeat troponin and urinalysis.  Pt handed off to oncoming ED staff awaiting U/A and delta trop. Please see his note for further results/eval.  This chart was dictated using voice recognition software.  Despite best efforts to proofread,  errors can occur which can change the documentation meaning.    Final Clinical Impressions(s) / ED Diagnoses   Final diagnoses:  Weakness generalized    ED Discharge Orders    None       Lennice Sites, DO 05/13/19 1559

## 2019-05-13 NOTE — ED Notes (Signed)
Pt transported to CT ?

## 2019-05-13 NOTE — ED Notes (Signed)
In and out cath unsuccessful. MD notified. Urine obtained from purwick suction.

## 2019-05-15 LAB — URINE CULTURE: Culture: >=100000 — AB

## 2019-05-16 ENCOUNTER — Telehealth: Payer: Self-pay | Admitting: Emergency Medicine

## 2019-05-16 NOTE — Telephone Encounter (Signed)
Post ED Visit - Positive Culture Follow-up  Culture report reviewed by antimicrobial stewardship pharmacist: [facility] Team []  Elenor Quinones, Pharm.D. []  Heide Guile, Pharm.D., BCPS AQ-ID []  Parks Neptune, Pharm.D., BCPS []  Alycia Rossetti, Pharm.D., BCPS []  [place], [place].D., BCPS, AAHIVP []  Legrand Como, Pharm.D., BCPS, AAHIVP []  Salome Arnt, PharmD, BCPS []  Johnnette Gourd, PharmD, BCPS []  Hughes Better, PharmD, BCPS [x]  Elicia Lamp, PharmD []  Laqueta Linden, PharmD, BCPS []  Albertina Parr, PharmD  [place] Team []  Leodis Sias, PharmD []  Lindell Spar, PharmD []  Royetta Asal, PharmD []  Graylin Shiver, Rph []  Rema Fendt) Glennon Mac, PharmD []  Arlyn Dunning, PharmD []  Netta Cedars, PharmD []  Dia Sitter, PharmD []  Leone Haven, PharmD []  Gretta Arab, PharmD []  Theodis Shove, PharmD []  Peggyann Juba, PharmD []  Reuel Boom, PharmD   Positive urine culture Treated with Cephalexin, organism sensitive to the same and no further patient follow-up is required at this time.  Sandi Raveling Destanee Bedonie 05/16/2019, 2:45 PM

## 2019-05-17 DIAGNOSIS — I4891 Unspecified atrial fibrillation: Secondary | ICD-10-CM | POA: Diagnosis not present

## 2019-05-17 DIAGNOSIS — I639 Cerebral infarction, unspecified: Secondary | ICD-10-CM | POA: Diagnosis not present

## 2019-05-17 DIAGNOSIS — G9340 Encephalopathy, unspecified: Secondary | ICD-10-CM | POA: Diagnosis not present

## 2019-05-17 DIAGNOSIS — R001 Bradycardia, unspecified: Secondary | ICD-10-CM | POA: Diagnosis not present

## 2019-05-18 ENCOUNTER — Other Ambulatory Visit: Payer: Self-pay | Admitting: *Deleted

## 2019-05-18 NOTE — Patient Outreach (Signed)
Member assessed for potential [facility] Care Management needs as a benefit of  [place] Medicare.  Member is currently receiving rehab therapy at [facility].  Member discussed in weekly telephonic IDT meeting with facility staff, [facility] UM team, and writer.  Facility dc planner reports member's disposition plan is home with family and hhc vs long term care at [facility]. States daughter Shauna Hugh is primary contact. Member waxes and wanes with cognition. Discussed writer will follow up with daughter regarding disposition plans and potential [facility] CM follow up.  Marthenia Rolling, MSN-Ed, RN,BSN [place] Acute Care Coordinator [phone] [facility]) [phone]  (Toll free office)

## 2019-05-20 DIAGNOSIS — R278 Other lack of coordination: Secondary | ICD-10-CM | POA: Diagnosis not present

## 2019-05-20 DIAGNOSIS — Z7952 Long term (current) use of systemic steroids: Secondary | ICD-10-CM | POA: Diagnosis not present

## 2019-05-20 DIAGNOSIS — H9193 Unspecified hearing loss, bilateral: Secondary | ICD-10-CM | POA: Diagnosis not present

## 2019-05-20 DIAGNOSIS — N390 Urinary tract infection, site not specified: Secondary | ICD-10-CM | POA: Diagnosis not present

## 2019-05-20 DIAGNOSIS — Z7901 Long term (current) use of anticoagulants: Secondary | ICD-10-CM | POA: Diagnosis not present

## 2019-05-20 DIAGNOSIS — U071 COVID-19: Secondary | ICD-10-CM | POA: Diagnosis not present

## 2019-05-20 DIAGNOSIS — I482 Chronic atrial fibrillation, unspecified: Secondary | ICD-10-CM | POA: Diagnosis not present

## 2019-05-20 DIAGNOSIS — I4891 Unspecified atrial fibrillation: Secondary | ICD-10-CM | POA: Diagnosis not present

## 2019-05-20 DIAGNOSIS — R2689 Other abnormalities of gait and mobility: Secondary | ICD-10-CM | POA: Diagnosis not present

## 2019-05-20 DIAGNOSIS — Z23 Encounter for immunization: Secondary | ICD-10-CM | POA: Diagnosis not present

## 2019-05-20 DIAGNOSIS — Z9181 History of falling: Secondary | ICD-10-CM | POA: Diagnosis not present

## 2019-05-20 DIAGNOSIS — I5042 Chronic combined systolic (congestive) and diastolic (congestive) heart failure: Secondary | ICD-10-CM | POA: Diagnosis not present

## 2019-05-20 DIAGNOSIS — K589 Irritable bowel syndrome without diarrhea: Secondary | ICD-10-CM | POA: Diagnosis not present

## 2019-05-20 DIAGNOSIS — K5792 Diverticulitis of intestine, part unspecified, without perforation or abscess without bleeding: Secondary | ICD-10-CM | POA: Diagnosis not present

## 2019-05-20 DIAGNOSIS — I495 Sick sinus syndrome: Secondary | ICD-10-CM | POA: Diagnosis not present

## 2019-05-20 DIAGNOSIS — R531 Weakness: Secondary | ICD-10-CM | POA: Diagnosis not present

## 2019-05-20 DIAGNOSIS — Z5181 Encounter for therapeutic drug level monitoring: Secondary | ICD-10-CM | POA: Diagnosis not present

## 2019-05-20 DIAGNOSIS — S82122D Displaced fracture of lateral condyle of left tibia, subsequent encounter for closed fracture with routine healing: Secondary | ICD-10-CM | POA: Diagnosis not present

## 2019-05-20 DIAGNOSIS — I639 Cerebral infarction, unspecified: Secondary | ICD-10-CM | POA: Diagnosis not present

## 2019-05-20 DIAGNOSIS — T460X1S Poisoning by cardiac-stimulant glycosides and drugs of similar action, accidental (unintentional), sequela: Secondary | ICD-10-CM | POA: Diagnosis not present

## 2019-05-20 DIAGNOSIS — I69398 Other sequelae of cerebral infarction: Secondary | ICD-10-CM | POA: Diagnosis not present

## 2019-05-20 DIAGNOSIS — J449 Chronic obstructive pulmonary disease, unspecified: Secondary | ICD-10-CM | POA: Diagnosis not present

## 2019-05-20 DIAGNOSIS — R262 Difficulty in walking, not elsewhere classified: Secondary | ICD-10-CM | POA: Diagnosis not present

## 2019-05-20 DIAGNOSIS — M6281 Muscle weakness (generalized): Secondary | ICD-10-CM | POA: Diagnosis not present

## 2019-05-20 DIAGNOSIS — R001 Bradycardia, unspecified: Secondary | ICD-10-CM | POA: Diagnosis not present

## 2019-05-20 DIAGNOSIS — I110 Hypertensive heart disease with heart failure: Secondary | ICD-10-CM | POA: Diagnosis not present

## 2019-05-20 DIAGNOSIS — Z950 Presence of cardiac pacemaker: Secondary | ICD-10-CM | POA: Diagnosis not present

## 2019-05-20 DIAGNOSIS — M353 Polymyalgia rheumatica: Secondary | ICD-10-CM | POA: Diagnosis not present

## 2019-05-24 ENCOUNTER — Other Ambulatory Visit: Payer: Self-pay | Admitting: *Deleted

## 2019-05-24 NOTE — Patient Outreach (Signed)
Member assessed for potential [facility] Care Management needs as a benefit of [place] Medicare.  Mrs. Guseman remains at [facility] for rehab therapy.  Telephone call made to Izola Price- daughter at [phone] to discuss [facility] follow up and to get a better idea of disposition plans.   Diane reports that they recently appealed member's discharge. States "she needs more rehab and we cannot take her home if she cannot walk." Discussed whether the plan is for member to stay at facility for long term. Dianne states "well we want her to be able to come home eventually." Levander Campion reports she does not know the latest and that she plans on speaking with the therapist to see how her mother is doing with therapy. Dianne appears to have an unrealistic perception of her mom's status. Unable to discuss further as Levander Campion was not willing to discuss dc plans with Probation officer at this time.   Discussed that writer will follow up at later time.  Will continue to collaborate with facility and [facility] UM team on member.    Marthenia Rolling, MSN-Ed, RN,BSN [place] Acute Care Coordinator [phone] [facility]) [phone]  (Toll free office)

## 2019-05-25 ENCOUNTER — Other Ambulatory Visit: Payer: Self-pay | Admitting: *Deleted

## 2019-05-25 NOTE — Patient Outreach (Signed)
Member assessed for potential [facility] Care Management needs as a benefit of  [place] Medicare.  Member is currently receiving rehab therapy at [facility].  Member discussed in weekly telephonic IDT meeting with facility staff, [facility] UM team, and writer.  Facility reports that member is making measurable gains and progress. States member is communicating better. Continues to require verbal cues.  Facility reports member's husband is eager for Mrs. Sulton to return home which is a contrast to what the daughter shared with Probation officer on just yesterday. Both daughter Levander Campion and husband are co- HCPOA for member, per facility. Discussed that it would be helpful if both husband and daughter were on the same page with disposition planning.  Writer will plan outreach to member's husband since he is also POA and is instrumental in disposition plans.   Marthenia Rolling, MSN-Ed, RN,BSN [place] Acute Care Coordinator [phone] [facility]) [phone]  (Toll free office)

## 2019-05-26 DIAGNOSIS — S82122D Displaced fracture of lateral condyle of left tibia, subsequent encounter for closed fracture with routine healing: Secondary | ICD-10-CM | POA: Diagnosis not present

## 2019-05-26 DIAGNOSIS — I495 Sick sinus syndrome: Secondary | ICD-10-CM | POA: Diagnosis not present

## 2019-05-26 DIAGNOSIS — R262 Difficulty in walking, not elsewhere classified: Secondary | ICD-10-CM | POA: Diagnosis not present

## 2019-05-26 DIAGNOSIS — I4891 Unspecified atrial fibrillation: Secondary | ICD-10-CM | POA: Diagnosis not present

## 2019-05-26 DIAGNOSIS — R001 Bradycardia, unspecified: Secondary | ICD-10-CM | POA: Diagnosis not present

## 2019-05-27 DIAGNOSIS — R531 Weakness: Secondary | ICD-10-CM | POA: Diagnosis not present

## 2019-05-27 DIAGNOSIS — I639 Cerebral infarction, unspecified: Secondary | ICD-10-CM | POA: Diagnosis not present

## 2019-05-27 DIAGNOSIS — I5042 Chronic combined systolic (congestive) and diastolic (congestive) heart failure: Secondary | ICD-10-CM | POA: Diagnosis not present

## 2019-05-27 DIAGNOSIS — S82122D Displaced fracture of lateral condyle of left tibia, subsequent encounter for closed fracture with routine healing: Secondary | ICD-10-CM | POA: Diagnosis not present

## 2019-06-01 ENCOUNTER — Other Ambulatory Visit: Payer: Self-pay | Admitting: *Deleted

## 2019-06-01 NOTE — Patient Outreach (Signed)
Member assessed for potential [facility] Care Management needs as a benefit of  [place] Medicare.  Member is currently receiving rehab therapy at [facility].  Member discussed in weekly telephonic IDT meeting with facility staff, [facility] UM team, and writer.  Facility reports Mrs. Granieri is progressing well with therapy. Facility to have meeting with family regarding disposition date and plans. The plan so far remains to return home with husband. Tentative dc date this Friday.  Will plan to make referral to [facility] Management for complex case management upon SNF discharge.   Marthenia Rolling, MSN-Ed, RN,BSN [place] Acute Care Coordinator [phone] [facility]) [phone]  (Toll free office)

## 2019-06-02 DIAGNOSIS — R001 Bradycardia, unspecified: Secondary | ICD-10-CM | POA: Diagnosis not present

## 2019-06-02 DIAGNOSIS — S82122D Displaced fracture of lateral condyle of left tibia, subsequent encounter for closed fracture with routine healing: Secondary | ICD-10-CM | POA: Diagnosis not present

## 2019-06-02 DIAGNOSIS — I639 Cerebral infarction, unspecified: Secondary | ICD-10-CM | POA: Diagnosis not present

## 2019-06-02 DIAGNOSIS — I4891 Unspecified atrial fibrillation: Secondary | ICD-10-CM | POA: Diagnosis not present

## 2019-06-06 DIAGNOSIS — Z7982 Long term (current) use of aspirin: Secondary | ICD-10-CM | POA: Diagnosis not present

## 2019-06-06 DIAGNOSIS — R001 Bradycardia, unspecified: Secondary | ICD-10-CM | POA: Diagnosis not present

## 2019-06-06 DIAGNOSIS — I482 Chronic atrial fibrillation, unspecified: Secondary | ICD-10-CM | POA: Diagnosis not present

## 2019-06-06 DIAGNOSIS — I5042 Chronic combined systolic (congestive) and diastolic (congestive) heart failure: Secondary | ICD-10-CM | POA: Diagnosis not present

## 2019-06-06 DIAGNOSIS — G8929 Other chronic pain: Secondary | ICD-10-CM | POA: Diagnosis not present

## 2019-06-06 DIAGNOSIS — Z7901 Long term (current) use of anticoagulants: Secondary | ICD-10-CM | POA: Diagnosis not present

## 2019-06-06 DIAGNOSIS — Z79899 Other long term (current) drug therapy: Secondary | ICD-10-CM | POA: Diagnosis not present

## 2019-06-06 DIAGNOSIS — Z950 Presence of cardiac pacemaker: Secondary | ICD-10-CM | POA: Diagnosis not present

## 2019-06-06 DIAGNOSIS — R531 Weakness: Secondary | ICD-10-CM | POA: Diagnosis not present

## 2019-06-06 DIAGNOSIS — M353 Polymyalgia rheumatica: Secondary | ICD-10-CM | POA: Diagnosis not present

## 2019-06-06 DIAGNOSIS — K589 Irritable bowel syndrome without diarrhea: Secondary | ICD-10-CM | POA: Diagnosis not present

## 2019-06-06 DIAGNOSIS — I69351 Hemiplegia and hemiparesis following cerebral infarction affecting right dominant side: Secondary | ICD-10-CM | POA: Diagnosis not present

## 2019-06-06 DIAGNOSIS — I110 Hypertensive heart disease with heart failure: Secondary | ICD-10-CM | POA: Diagnosis not present

## 2019-06-06 DIAGNOSIS — J449 Chronic obstructive pulmonary disease, unspecified: Secondary | ICD-10-CM | POA: Diagnosis not present

## 2019-06-06 DIAGNOSIS — M25562 Pain in left knee: Secondary | ICD-10-CM | POA: Diagnosis not present

## 2019-06-06 DIAGNOSIS — F329 Major depressive disorder, single episode, unspecified: Secondary | ICD-10-CM | POA: Diagnosis not present

## 2019-06-06 DIAGNOSIS — K219 Gastro-esophageal reflux disease without esophagitis: Secondary | ICD-10-CM | POA: Diagnosis not present

## 2019-06-06 DIAGNOSIS — S82122D Displaced fracture of lateral condyle of left tibia, subsequent encounter for closed fracture with routine healing: Secondary | ICD-10-CM | POA: Diagnosis not present

## 2019-06-06 DIAGNOSIS — Z7952 Long term (current) use of systemic steroids: Secondary | ICD-10-CM | POA: Diagnosis not present

## 2019-06-07 ENCOUNTER — Other Ambulatory Visit: Payer: Self-pay | Admitting: *Deleted

## 2019-06-07 DIAGNOSIS — I10 Essential (primary) hypertension: Secondary | ICD-10-CM

## 2019-06-07 DIAGNOSIS — J449 Chronic obstructive pulmonary disease, unspecified: Secondary | ICD-10-CM | POA: Diagnosis not present

## 2019-06-07 DIAGNOSIS — I482 Chronic atrial fibrillation, unspecified: Secondary | ICD-10-CM | POA: Diagnosis not present

## 2019-06-07 DIAGNOSIS — I110 Hypertensive heart disease with heart failure: Secondary | ICD-10-CM | POA: Diagnosis not present

## 2019-06-07 DIAGNOSIS — I69351 Hemiplegia and hemiparesis following cerebral infarction affecting right dominant side: Secondary | ICD-10-CM | POA: Diagnosis not present

## 2019-06-07 DIAGNOSIS — I5042 Chronic combined systolic (congestive) and diastolic (congestive) heart failure: Secondary | ICD-10-CM | POA: Diagnosis not present

## 2019-06-07 DIAGNOSIS — S82122D Displaced fracture of lateral condyle of left tibia, subsequent encounter for closed fracture with routine healing: Secondary | ICD-10-CM | POA: Diagnosis not present

## 2019-06-07 NOTE — Patient Outreach (Signed)
Member assessed for potential [facility] Care Management needs as a benefit of [place] Medicare.  Verified with [facility] that [name] [name] discharged on 06/05/19. As previously discussed with facility, member transitioned home with husband and home health.  Will make referral for [facility] RNCM. [name] has a medical history of A-fib, HTN, depression, CVA, HLD.   Marthenia Rolling, MSN-Ed, RN,BSN [place] Acute Care Coordinator [phone] [facility]) [phone]  (Toll free office)

## 2019-06-07 NOTE — Patient Outreach (Signed)
Referral received from post acute care coordinatory, [facility], Primary MD completes transition of care, pt with diagnoses A-fib, depression, CHF, HTN, HLD, pt is to have home health. Outreach call to pt and spouse answered the phone and states " she's working with home health physical therapist and cannot talk right now"   Requests phone call another day this week.  PLAN Outreach pt within 1-2 business days  Jacqlyn Larsen [facility], [place] Coordinator [phone]

## 2019-06-08 ENCOUNTER — Other Ambulatory Visit: Payer: Self-pay | Admitting: *Deleted

## 2019-06-08 NOTE — Patient Outreach (Signed)
Outreach call to pt for screening, no answer to telephone, no option to leave voicemail.  Incoming call then came in from patient's phone number and no one spoke.  RN CM mailed unsuccessful outreach letter to pt home.  PLAN Outreach pt in 3-4 business days  Jacqlyn Larsen [facility], [place] [phone]

## 2019-06-09 ENCOUNTER — Other Ambulatory Visit: Payer: Medicare Other

## 2019-06-09 DIAGNOSIS — I110 Hypertensive heart disease with heart failure: Secondary | ICD-10-CM | POA: Diagnosis not present

## 2019-06-09 DIAGNOSIS — F331 Major depressive disorder, recurrent, moderate: Secondary | ICD-10-CM | POA: Diagnosis not present

## 2019-06-09 DIAGNOSIS — M1712 Unilateral primary osteoarthritis, left knee: Secondary | ICD-10-CM | POA: Diagnosis not present

## 2019-06-09 DIAGNOSIS — J449 Chronic obstructive pulmonary disease, unspecified: Secondary | ICD-10-CM | POA: Diagnosis not present

## 2019-06-09 DIAGNOSIS — I5032 Chronic diastolic (congestive) heart failure: Secondary | ICD-10-CM | POA: Diagnosis not present

## 2019-06-09 DIAGNOSIS — Z6823 Body mass index (BMI) 23.0-23.9, adult: Secondary | ICD-10-CM | POA: Diagnosis not present

## 2019-06-09 DIAGNOSIS — I69351 Hemiplegia and hemiparesis following cerebral infarction affecting right dominant side: Secondary | ICD-10-CM | POA: Diagnosis not present

## 2019-06-09 DIAGNOSIS — S82122D Displaced fracture of lateral condyle of left tibia, subsequent encounter for closed fracture with routine healing: Secondary | ICD-10-CM | POA: Diagnosis not present

## 2019-06-09 DIAGNOSIS — I4891 Unspecified atrial fibrillation: Secondary | ICD-10-CM | POA: Diagnosis not present

## 2019-06-09 DIAGNOSIS — I5042 Chronic combined systolic (congestive) and diastolic (congestive) heart failure: Secondary | ICD-10-CM | POA: Diagnosis not present

## 2019-06-09 DIAGNOSIS — I482 Chronic atrial fibrillation, unspecified: Secondary | ICD-10-CM | POA: Diagnosis not present

## 2019-06-09 DIAGNOSIS — G8194 Hemiplegia, unspecified affecting left nondominant side: Secondary | ICD-10-CM | POA: Diagnosis not present

## 2019-06-10 DIAGNOSIS — S82122D Displaced fracture of lateral condyle of left tibia, subsequent encounter for closed fracture with routine healing: Secondary | ICD-10-CM | POA: Diagnosis not present

## 2019-06-10 DIAGNOSIS — I5042 Chronic combined systolic (congestive) and diastolic (congestive) heart failure: Secondary | ICD-10-CM | POA: Diagnosis not present

## 2019-06-10 DIAGNOSIS — I110 Hypertensive heart disease with heart failure: Secondary | ICD-10-CM | POA: Diagnosis not present

## 2019-06-10 DIAGNOSIS — J449 Chronic obstructive pulmonary disease, unspecified: Secondary | ICD-10-CM | POA: Diagnosis not present

## 2019-06-10 DIAGNOSIS — I69351 Hemiplegia and hemiparesis following cerebral infarction affecting right dominant side: Secondary | ICD-10-CM | POA: Diagnosis not present

## 2019-06-10 DIAGNOSIS — I482 Chronic atrial fibrillation, unspecified: Secondary | ICD-10-CM | POA: Diagnosis not present

## 2019-06-11 ENCOUNTER — Other Ambulatory Visit: Payer: Self-pay | Admitting: *Deleted

## 2019-06-11 NOTE — Patient Outreach (Addendum)
Outreach call to pt for screening, no answer to telephone, left voicemail requesting return phone call.  PLAN  Close case in 3-4 business days  Jacqlyn Larsen [facility], [place] Coordinator [phone]

## 2019-06-14 DIAGNOSIS — I110 Hypertensive heart disease with heart failure: Secondary | ICD-10-CM | POA: Diagnosis not present

## 2019-06-14 DIAGNOSIS — S82122D Displaced fracture of lateral condyle of left tibia, subsequent encounter for closed fracture with routine healing: Secondary | ICD-10-CM | POA: Diagnosis not present

## 2019-06-14 DIAGNOSIS — J449 Chronic obstructive pulmonary disease, unspecified: Secondary | ICD-10-CM | POA: Diagnosis not present

## 2019-06-14 DIAGNOSIS — I5042 Chronic combined systolic (congestive) and diastolic (congestive) heart failure: Secondary | ICD-10-CM | POA: Diagnosis not present

## 2019-06-14 DIAGNOSIS — I482 Chronic atrial fibrillation, unspecified: Secondary | ICD-10-CM | POA: Diagnosis not present

## 2019-06-14 DIAGNOSIS — I69351 Hemiplegia and hemiparesis following cerebral infarction affecting right dominant side: Secondary | ICD-10-CM | POA: Diagnosis not present

## 2019-06-15 DIAGNOSIS — S82122D Displaced fracture of lateral condyle of left tibia, subsequent encounter for closed fracture with routine healing: Secondary | ICD-10-CM | POA: Diagnosis not present

## 2019-06-15 DIAGNOSIS — J449 Chronic obstructive pulmonary disease, unspecified: Secondary | ICD-10-CM | POA: Diagnosis not present

## 2019-06-15 DIAGNOSIS — I482 Chronic atrial fibrillation, unspecified: Secondary | ICD-10-CM | POA: Diagnosis not present

## 2019-06-15 DIAGNOSIS — I5042 Chronic combined systolic (congestive) and diastolic (congestive) heart failure: Secondary | ICD-10-CM | POA: Diagnosis not present

## 2019-06-15 DIAGNOSIS — I69351 Hemiplegia and hemiparesis following cerebral infarction affecting right dominant side: Secondary | ICD-10-CM | POA: Diagnosis not present

## 2019-06-15 DIAGNOSIS — I110 Hypertensive heart disease with heart failure: Secondary | ICD-10-CM | POA: Diagnosis not present

## 2019-06-16 ENCOUNTER — Other Ambulatory Visit: Payer: Self-pay | Admitting: *Deleted

## 2019-06-17 DIAGNOSIS — J449 Chronic obstructive pulmonary disease, unspecified: Secondary | ICD-10-CM | POA: Diagnosis not present

## 2019-06-17 DIAGNOSIS — I69351 Hemiplegia and hemiparesis following cerebral infarction affecting right dominant side: Secondary | ICD-10-CM | POA: Diagnosis not present

## 2019-06-17 DIAGNOSIS — S82122D Displaced fracture of lateral condyle of left tibia, subsequent encounter for closed fracture with routine healing: Secondary | ICD-10-CM | POA: Diagnosis not present

## 2019-06-17 DIAGNOSIS — I482 Chronic atrial fibrillation, unspecified: Secondary | ICD-10-CM | POA: Diagnosis not present

## 2019-06-17 DIAGNOSIS — I110 Hypertensive heart disease with heart failure: Secondary | ICD-10-CM | POA: Diagnosis not present

## 2019-06-17 DIAGNOSIS — N390 Urinary tract infection, site not specified: Secondary | ICD-10-CM | POA: Diagnosis not present

## 2019-06-17 DIAGNOSIS — I5042 Chronic combined systolic (congestive) and diastolic (congestive) heart failure: Secondary | ICD-10-CM | POA: Diagnosis not present

## 2019-06-18 DIAGNOSIS — I5042 Chronic combined systolic (congestive) and diastolic (congestive) heart failure: Secondary | ICD-10-CM | POA: Diagnosis not present

## 2019-06-18 DIAGNOSIS — I69351 Hemiplegia and hemiparesis following cerebral infarction affecting right dominant side: Secondary | ICD-10-CM | POA: Diagnosis not present

## 2019-06-18 DIAGNOSIS — I110 Hypertensive heart disease with heart failure: Secondary | ICD-10-CM | POA: Diagnosis not present

## 2019-06-18 DIAGNOSIS — J449 Chronic obstructive pulmonary disease, unspecified: Secondary | ICD-10-CM | POA: Diagnosis not present

## 2019-06-18 DIAGNOSIS — S82122D Displaced fracture of lateral condyle of left tibia, subsequent encounter for closed fracture with routine healing: Secondary | ICD-10-CM | POA: Diagnosis not present

## 2019-06-18 DIAGNOSIS — N3090 Cystitis, unspecified without hematuria: Secondary | ICD-10-CM | POA: Diagnosis not present

## 2019-06-18 DIAGNOSIS — I482 Chronic atrial fibrillation, unspecified: Secondary | ICD-10-CM | POA: Diagnosis not present

## 2019-06-18 DIAGNOSIS — I4891 Unspecified atrial fibrillation: Secondary | ICD-10-CM | POA: Diagnosis not present

## 2019-06-18 DIAGNOSIS — I5032 Chronic diastolic (congestive) heart failure: Secondary | ICD-10-CM | POA: Diagnosis not present

## 2019-06-22 DIAGNOSIS — I110 Hypertensive heart disease with heart failure: Secondary | ICD-10-CM | POA: Diagnosis not present

## 2019-06-22 DIAGNOSIS — I482 Chronic atrial fibrillation, unspecified: Secondary | ICD-10-CM | POA: Diagnosis not present

## 2019-06-22 DIAGNOSIS — S82122D Displaced fracture of lateral condyle of left tibia, subsequent encounter for closed fracture with routine healing: Secondary | ICD-10-CM | POA: Diagnosis not present

## 2019-06-22 DIAGNOSIS — I69351 Hemiplegia and hemiparesis following cerebral infarction affecting right dominant side: Secondary | ICD-10-CM | POA: Diagnosis not present

## 2019-06-22 DIAGNOSIS — I5042 Chronic combined systolic (congestive) and diastolic (congestive) heart failure: Secondary | ICD-10-CM | POA: Diagnosis not present

## 2019-06-22 DIAGNOSIS — J449 Chronic obstructive pulmonary disease, unspecified: Secondary | ICD-10-CM | POA: Diagnosis not present

## 2019-06-23 DIAGNOSIS — J449 Chronic obstructive pulmonary disease, unspecified: Secondary | ICD-10-CM | POA: Diagnosis not present

## 2019-06-23 DIAGNOSIS — I482 Chronic atrial fibrillation, unspecified: Secondary | ICD-10-CM | POA: Diagnosis not present

## 2019-06-23 DIAGNOSIS — S82122D Displaced fracture of lateral condyle of left tibia, subsequent encounter for closed fracture with routine healing: Secondary | ICD-10-CM | POA: Diagnosis not present

## 2019-06-23 DIAGNOSIS — I69351 Hemiplegia and hemiparesis following cerebral infarction affecting right dominant side: Secondary | ICD-10-CM | POA: Diagnosis not present

## 2019-06-23 DIAGNOSIS — I5042 Chronic combined systolic (congestive) and diastolic (congestive) heart failure: Secondary | ICD-10-CM | POA: Diagnosis not present

## 2019-06-23 DIAGNOSIS — I110 Hypertensive heart disease with heart failure: Secondary | ICD-10-CM | POA: Diagnosis not present

## 2019-06-24 DIAGNOSIS — I482 Chronic atrial fibrillation, unspecified: Secondary | ICD-10-CM | POA: Diagnosis not present

## 2019-06-24 DIAGNOSIS — S82122D Displaced fracture of lateral condyle of left tibia, subsequent encounter for closed fracture with routine healing: Secondary | ICD-10-CM | POA: Diagnosis not present

## 2019-06-24 DIAGNOSIS — I5042 Chronic combined systolic (congestive) and diastolic (congestive) heart failure: Secondary | ICD-10-CM | POA: Diagnosis not present

## 2019-06-24 DIAGNOSIS — I110 Hypertensive heart disease with heart failure: Secondary | ICD-10-CM | POA: Diagnosis not present

## 2019-06-24 DIAGNOSIS — J449 Chronic obstructive pulmonary disease, unspecified: Secondary | ICD-10-CM | POA: Diagnosis not present

## 2019-06-24 DIAGNOSIS — I69351 Hemiplegia and hemiparesis following cerebral infarction affecting right dominant side: Secondary | ICD-10-CM | POA: Diagnosis not present

## 2019-06-30 DIAGNOSIS — J449 Chronic obstructive pulmonary disease, unspecified: Secondary | ICD-10-CM | POA: Diagnosis not present

## 2019-06-30 DIAGNOSIS — I110 Hypertensive heart disease with heart failure: Secondary | ICD-10-CM | POA: Diagnosis not present

## 2019-06-30 DIAGNOSIS — I482 Chronic atrial fibrillation, unspecified: Secondary | ICD-10-CM | POA: Diagnosis not present

## 2019-06-30 DIAGNOSIS — S82122D Displaced fracture of lateral condyle of left tibia, subsequent encounter for closed fracture with routine healing: Secondary | ICD-10-CM | POA: Diagnosis not present

## 2019-06-30 DIAGNOSIS — I69351 Hemiplegia and hemiparesis following cerebral infarction affecting right dominant side: Secondary | ICD-10-CM | POA: Diagnosis not present

## 2019-06-30 DIAGNOSIS — I5042 Chronic combined systolic (congestive) and diastolic (congestive) heart failure: Secondary | ICD-10-CM | POA: Diagnosis not present

## 2019-07-02 DIAGNOSIS — I5042 Chronic combined systolic (congestive) and diastolic (congestive) heart failure: Secondary | ICD-10-CM | POA: Diagnosis not present

## 2019-07-02 DIAGNOSIS — I69351 Hemiplegia and hemiparesis following cerebral infarction affecting right dominant side: Secondary | ICD-10-CM | POA: Diagnosis not present

## 2019-07-02 DIAGNOSIS — I482 Chronic atrial fibrillation, unspecified: Secondary | ICD-10-CM | POA: Diagnosis not present

## 2019-07-02 DIAGNOSIS — I110 Hypertensive heart disease with heart failure: Secondary | ICD-10-CM | POA: Diagnosis not present

## 2019-07-02 DIAGNOSIS — S82122D Displaced fracture of lateral condyle of left tibia, subsequent encounter for closed fracture with routine healing: Secondary | ICD-10-CM | POA: Diagnosis not present

## 2019-07-02 DIAGNOSIS — J449 Chronic obstructive pulmonary disease, unspecified: Secondary | ICD-10-CM | POA: Diagnosis not present

## 2019-07-05 DIAGNOSIS — G8194 Hemiplegia, unspecified affecting left nondominant side: Secondary | ICD-10-CM | POA: Diagnosis not present

## 2019-07-05 DIAGNOSIS — I5032 Chronic diastolic (congestive) heart failure: Secondary | ICD-10-CM | POA: Diagnosis not present

## 2019-07-05 DIAGNOSIS — I4891 Unspecified atrial fibrillation: Secondary | ICD-10-CM | POA: Diagnosis not present

## 2019-07-05 DIAGNOSIS — M1712 Unilateral primary osteoarthritis, left knee: Secondary | ICD-10-CM | POA: Diagnosis not present

## 2019-07-05 DIAGNOSIS — F331 Major depressive disorder, recurrent, moderate: Secondary | ICD-10-CM | POA: Diagnosis not present

## 2019-07-06 DIAGNOSIS — Z950 Presence of cardiac pacemaker: Secondary | ICD-10-CM | POA: Diagnosis not present

## 2019-07-06 DIAGNOSIS — K219 Gastro-esophageal reflux disease without esophagitis: Secondary | ICD-10-CM | POA: Diagnosis not present

## 2019-07-06 DIAGNOSIS — I5042 Chronic combined systolic (congestive) and diastolic (congestive) heart failure: Secondary | ICD-10-CM | POA: Diagnosis not present

## 2019-07-06 DIAGNOSIS — I110 Hypertensive heart disease with heart failure: Secondary | ICD-10-CM | POA: Diagnosis not present

## 2019-07-06 DIAGNOSIS — R001 Bradycardia, unspecified: Secondary | ICD-10-CM | POA: Diagnosis not present

## 2019-07-06 DIAGNOSIS — Z7901 Long term (current) use of anticoagulants: Secondary | ICD-10-CM | POA: Diagnosis not present

## 2019-07-06 DIAGNOSIS — Z7982 Long term (current) use of aspirin: Secondary | ICD-10-CM | POA: Diagnosis not present

## 2019-07-06 DIAGNOSIS — I69351 Hemiplegia and hemiparesis following cerebral infarction affecting right dominant side: Secondary | ICD-10-CM | POA: Diagnosis not present

## 2019-07-06 DIAGNOSIS — R531 Weakness: Secondary | ICD-10-CM | POA: Diagnosis not present

## 2019-07-06 DIAGNOSIS — S82122D Displaced fracture of lateral condyle of left tibia, subsequent encounter for closed fracture with routine healing: Secondary | ICD-10-CM | POA: Diagnosis not present

## 2019-07-06 DIAGNOSIS — F329 Major depressive disorder, single episode, unspecified: Secondary | ICD-10-CM | POA: Diagnosis not present

## 2019-07-06 DIAGNOSIS — G8929 Other chronic pain: Secondary | ICD-10-CM | POA: Diagnosis not present

## 2019-07-06 DIAGNOSIS — M353 Polymyalgia rheumatica: Secondary | ICD-10-CM | POA: Diagnosis not present

## 2019-07-06 DIAGNOSIS — J449 Chronic obstructive pulmonary disease, unspecified: Secondary | ICD-10-CM | POA: Diagnosis not present

## 2019-07-06 DIAGNOSIS — Z79899 Other long term (current) drug therapy: Secondary | ICD-10-CM | POA: Diagnosis not present

## 2019-07-06 DIAGNOSIS — M25562 Pain in left knee: Secondary | ICD-10-CM | POA: Diagnosis not present

## 2019-07-06 DIAGNOSIS — Z7952 Long term (current) use of systemic steroids: Secondary | ICD-10-CM | POA: Diagnosis not present

## 2019-07-06 DIAGNOSIS — I482 Chronic atrial fibrillation, unspecified: Secondary | ICD-10-CM | POA: Diagnosis not present

## 2019-07-06 DIAGNOSIS — K589 Irritable bowel syndrome without diarrhea: Secondary | ICD-10-CM | POA: Diagnosis not present

## 2019-07-07 DIAGNOSIS — I69354 Hemiplegia and hemiparesis following cerebral infarction affecting left non-dominant side: Secondary | ICD-10-CM | POA: Diagnosis not present

## 2019-07-07 DIAGNOSIS — Z7901 Long term (current) use of anticoagulants: Secondary | ICD-10-CM | POA: Diagnosis not present

## 2019-07-07 DIAGNOSIS — I5042 Chronic combined systolic (congestive) and diastolic (congestive) heart failure: Secondary | ICD-10-CM | POA: Diagnosis not present

## 2019-07-07 DIAGNOSIS — I110 Hypertensive heart disease with heart failure: Secondary | ICD-10-CM | POA: Diagnosis not present

## 2019-07-07 DIAGNOSIS — I10 Essential (primary) hypertension: Secondary | ICD-10-CM | POA: Diagnosis present

## 2019-07-07 DIAGNOSIS — M353 Polymyalgia rheumatica: Secondary | ICD-10-CM | POA: Diagnosis present

## 2019-07-07 DIAGNOSIS — F339 Major depressive disorder, recurrent, unspecified: Secondary | ICD-10-CM | POA: Diagnosis present

## 2019-07-07 DIAGNOSIS — Z20828 Contact with and (suspected) exposure to other viral communicable diseases: Secondary | ICD-10-CM | POA: Diagnosis present

## 2019-07-07 DIAGNOSIS — I69351 Hemiplegia and hemiparesis following cerebral infarction affecting right dominant side: Secondary | ICD-10-CM | POA: Diagnosis not present

## 2019-07-07 DIAGNOSIS — S82122D Displaced fracture of lateral condyle of left tibia, subsequent encounter for closed fracture with routine healing: Secondary | ICD-10-CM | POA: Diagnosis not present

## 2019-07-07 DIAGNOSIS — I4821 Permanent atrial fibrillation: Secondary | ICD-10-CM | POA: Diagnosis present

## 2019-07-07 DIAGNOSIS — I5033 Acute on chronic diastolic (congestive) heart failure: Secondary | ICD-10-CM | POA: Diagnosis present

## 2019-07-07 DIAGNOSIS — R0602 Shortness of breath: Secondary | ICD-10-CM | POA: Diagnosis not present

## 2019-07-07 DIAGNOSIS — Z950 Presence of cardiac pacemaker: Secondary | ICD-10-CM | POA: Diagnosis not present

## 2019-07-07 DIAGNOSIS — J449 Chronic obstructive pulmonary disease, unspecified: Secondary | ICD-10-CM | POA: Diagnosis not present

## 2019-07-07 DIAGNOSIS — I509 Heart failure, unspecified: Secondary | ICD-10-CM | POA: Diagnosis not present

## 2019-07-07 DIAGNOSIS — I482 Chronic atrial fibrillation, unspecified: Secondary | ICD-10-CM | POA: Diagnosis not present

## 2019-07-07 DIAGNOSIS — J811 Chronic pulmonary edema: Secondary | ICD-10-CM | POA: Diagnosis not present

## 2019-07-07 DIAGNOSIS — Z7982 Long term (current) use of aspirin: Secondary | ICD-10-CM | POA: Diagnosis not present

## 2019-07-07 DIAGNOSIS — E785 Hyperlipidemia, unspecified: Secondary | ICD-10-CM | POA: Diagnosis present

## 2019-07-07 DIAGNOSIS — K219 Gastro-esophageal reflux disease without esophagitis: Secondary | ICD-10-CM | POA: Diagnosis present

## 2019-07-07 DIAGNOSIS — M1990 Unspecified osteoarthritis, unspecified site: Secondary | ICD-10-CM | POA: Diagnosis present

## 2019-07-10 DIAGNOSIS — J449 Chronic obstructive pulmonary disease, unspecified: Secondary | ICD-10-CM | POA: Diagnosis not present

## 2019-07-10 DIAGNOSIS — I5042 Chronic combined systolic (congestive) and diastolic (congestive) heart failure: Secondary | ICD-10-CM | POA: Diagnosis not present

## 2019-07-10 DIAGNOSIS — I110 Hypertensive heart disease with heart failure: Secondary | ICD-10-CM | POA: Diagnosis not present

## 2019-07-10 DIAGNOSIS — S82122D Displaced fracture of lateral condyle of left tibia, subsequent encounter for closed fracture with routine healing: Secondary | ICD-10-CM | POA: Diagnosis not present

## 2019-07-10 DIAGNOSIS — I69351 Hemiplegia and hemiparesis following cerebral infarction affecting right dominant side: Secondary | ICD-10-CM | POA: Diagnosis not present

## 2019-07-10 DIAGNOSIS — I482 Chronic atrial fibrillation, unspecified: Secondary | ICD-10-CM | POA: Diagnosis not present

## 2019-07-12 DIAGNOSIS — I110 Hypertensive heart disease with heart failure: Secondary | ICD-10-CM | POA: Diagnosis not present

## 2019-07-12 DIAGNOSIS — S82122D Displaced fracture of lateral condyle of left tibia, subsequent encounter for closed fracture with routine healing: Secondary | ICD-10-CM | POA: Diagnosis not present

## 2019-07-12 DIAGNOSIS — I482 Chronic atrial fibrillation, unspecified: Secondary | ICD-10-CM | POA: Diagnosis not present

## 2019-07-12 DIAGNOSIS — I69351 Hemiplegia and hemiparesis following cerebral infarction affecting right dominant side: Secondary | ICD-10-CM | POA: Diagnosis not present

## 2019-07-12 DIAGNOSIS — I5042 Chronic combined systolic (congestive) and diastolic (congestive) heart failure: Secondary | ICD-10-CM | POA: Diagnosis not present

## 2019-07-12 DIAGNOSIS — J449 Chronic obstructive pulmonary disease, unspecified: Secondary | ICD-10-CM | POA: Diagnosis not present

## 2019-07-14 ENCOUNTER — Other Ambulatory Visit: Payer: Self-pay

## 2019-07-17 DIAGNOSIS — I482 Chronic atrial fibrillation, unspecified: Secondary | ICD-10-CM | POA: Diagnosis not present

## 2019-07-17 DIAGNOSIS — I5042 Chronic combined systolic (congestive) and diastolic (congestive) heart failure: Secondary | ICD-10-CM | POA: Diagnosis not present

## 2019-07-17 DIAGNOSIS — I69351 Hemiplegia and hemiparesis following cerebral infarction affecting right dominant side: Secondary | ICD-10-CM | POA: Diagnosis not present

## 2019-07-17 DIAGNOSIS — J449 Chronic obstructive pulmonary disease, unspecified: Secondary | ICD-10-CM | POA: Diagnosis not present

## 2019-07-17 DIAGNOSIS — S82122D Displaced fracture of lateral condyle of left tibia, subsequent encounter for closed fracture with routine healing: Secondary | ICD-10-CM | POA: Diagnosis not present

## 2019-07-17 DIAGNOSIS — I110 Hypertensive heart disease with heart failure: Secondary | ICD-10-CM | POA: Diagnosis not present

## 2019-07-19 DIAGNOSIS — I4891 Unspecified atrial fibrillation: Secondary | ICD-10-CM | POA: Diagnosis not present

## 2019-07-19 DIAGNOSIS — I110 Hypertensive heart disease with heart failure: Secondary | ICD-10-CM | POA: Diagnosis not present

## 2019-07-19 DIAGNOSIS — I69351 Hemiplegia and hemiparesis following cerebral infarction affecting right dominant side: Secondary | ICD-10-CM | POA: Diagnosis not present

## 2019-07-19 DIAGNOSIS — S82122D Displaced fracture of lateral condyle of left tibia, subsequent encounter for closed fracture with routine healing: Secondary | ICD-10-CM | POA: Diagnosis not present

## 2019-07-19 DIAGNOSIS — J449 Chronic obstructive pulmonary disease, unspecified: Secondary | ICD-10-CM | POA: Diagnosis not present

## 2019-07-19 DIAGNOSIS — I482 Chronic atrial fibrillation, unspecified: Secondary | ICD-10-CM | POA: Diagnosis not present

## 2019-07-19 DIAGNOSIS — I5032 Chronic diastolic (congestive) heart failure: Secondary | ICD-10-CM | POA: Diagnosis not present

## 2019-07-19 DIAGNOSIS — I5042 Chronic combined systolic (congestive) and diastolic (congestive) heart failure: Secondary | ICD-10-CM | POA: Diagnosis not present

## 2019-07-20 DIAGNOSIS — S82122D Displaced fracture of lateral condyle of left tibia, subsequent encounter for closed fracture with routine healing: Secondary | ICD-10-CM | POA: Diagnosis not present

## 2019-07-20 DIAGNOSIS — I5042 Chronic combined systolic (congestive) and diastolic (congestive) heart failure: Secondary | ICD-10-CM | POA: Diagnosis not present

## 2019-07-20 DIAGNOSIS — J449 Chronic obstructive pulmonary disease, unspecified: Secondary | ICD-10-CM | POA: Diagnosis not present

## 2019-07-20 DIAGNOSIS — I482 Chronic atrial fibrillation, unspecified: Secondary | ICD-10-CM | POA: Diagnosis not present

## 2019-07-20 DIAGNOSIS — I69351 Hemiplegia and hemiparesis following cerebral infarction affecting right dominant side: Secondary | ICD-10-CM | POA: Diagnosis not present

## 2019-07-20 DIAGNOSIS — I110 Hypertensive heart disease with heart failure: Secondary | ICD-10-CM | POA: Diagnosis not present

## 2019-07-22 DIAGNOSIS — I482 Chronic atrial fibrillation, unspecified: Secondary | ICD-10-CM | POA: Diagnosis not present

## 2019-07-22 DIAGNOSIS — I69351 Hemiplegia and hemiparesis following cerebral infarction affecting right dominant side: Secondary | ICD-10-CM | POA: Diagnosis not present

## 2019-07-22 DIAGNOSIS — I110 Hypertensive heart disease with heart failure: Secondary | ICD-10-CM | POA: Diagnosis not present

## 2019-07-22 DIAGNOSIS — J449 Chronic obstructive pulmonary disease, unspecified: Secondary | ICD-10-CM | POA: Diagnosis not present

## 2019-07-22 DIAGNOSIS — S82122D Displaced fracture of lateral condyle of left tibia, subsequent encounter for closed fracture with routine healing: Secondary | ICD-10-CM | POA: Diagnosis not present

## 2019-07-22 DIAGNOSIS — I5042 Chronic combined systolic (congestive) and diastolic (congestive) heart failure: Secondary | ICD-10-CM | POA: Diagnosis not present

## 2019-07-26 DIAGNOSIS — I5042 Chronic combined systolic (congestive) and diastolic (congestive) heart failure: Secondary | ICD-10-CM | POA: Diagnosis not present

## 2019-07-26 DIAGNOSIS — J449 Chronic obstructive pulmonary disease, unspecified: Secondary | ICD-10-CM | POA: Diagnosis not present

## 2019-07-26 DIAGNOSIS — S82122D Displaced fracture of lateral condyle of left tibia, subsequent encounter for closed fracture with routine healing: Secondary | ICD-10-CM | POA: Diagnosis not present

## 2019-07-26 DIAGNOSIS — I482 Chronic atrial fibrillation, unspecified: Secondary | ICD-10-CM | POA: Diagnosis not present

## 2019-07-26 DIAGNOSIS — I110 Hypertensive heart disease with heart failure: Secondary | ICD-10-CM | POA: Diagnosis not present

## 2019-07-26 DIAGNOSIS — I69351 Hemiplegia and hemiparesis following cerebral infarction affecting right dominant side: Secondary | ICD-10-CM | POA: Diagnosis not present

## 2019-07-28 DIAGNOSIS — I5042 Chronic combined systolic (congestive) and diastolic (congestive) heart failure: Secondary | ICD-10-CM | POA: Diagnosis not present

## 2019-07-28 DIAGNOSIS — J449 Chronic obstructive pulmonary disease, unspecified: Secondary | ICD-10-CM | POA: Diagnosis not present

## 2019-07-28 DIAGNOSIS — I110 Hypertensive heart disease with heart failure: Secondary | ICD-10-CM | POA: Diagnosis not present

## 2019-07-28 DIAGNOSIS — I69351 Hemiplegia and hemiparesis following cerebral infarction affecting right dominant side: Secondary | ICD-10-CM | POA: Diagnosis not present

## 2019-07-28 DIAGNOSIS — I482 Chronic atrial fibrillation, unspecified: Secondary | ICD-10-CM | POA: Diagnosis not present

## 2019-07-28 DIAGNOSIS — S82122D Displaced fracture of lateral condyle of left tibia, subsequent encounter for closed fracture with routine healing: Secondary | ICD-10-CM | POA: Diagnosis not present

## 2019-07-29 DIAGNOSIS — J449 Chronic obstructive pulmonary disease, unspecified: Secondary | ICD-10-CM | POA: Diagnosis not present

## 2019-07-29 DIAGNOSIS — I110 Hypertensive heart disease with heart failure: Secondary | ICD-10-CM | POA: Diagnosis not present

## 2019-07-29 DIAGNOSIS — I482 Chronic atrial fibrillation, unspecified: Secondary | ICD-10-CM | POA: Diagnosis not present

## 2019-07-29 DIAGNOSIS — I5042 Chronic combined systolic (congestive) and diastolic (congestive) heart failure: Secondary | ICD-10-CM | POA: Diagnosis not present

## 2019-07-29 DIAGNOSIS — I69351 Hemiplegia and hemiparesis following cerebral infarction affecting right dominant side: Secondary | ICD-10-CM | POA: Diagnosis not present

## 2019-07-29 DIAGNOSIS — S82122D Displaced fracture of lateral condyle of left tibia, subsequent encounter for closed fracture with routine healing: Secondary | ICD-10-CM | POA: Diagnosis not present

## 2019-08-02 DIAGNOSIS — I69351 Hemiplegia and hemiparesis following cerebral infarction affecting right dominant side: Secondary | ICD-10-CM | POA: Diagnosis not present

## 2019-08-02 DIAGNOSIS — I5042 Chronic combined systolic (congestive) and diastolic (congestive) heart failure: Secondary | ICD-10-CM | POA: Diagnosis not present

## 2019-08-02 DIAGNOSIS — S82122D Displaced fracture of lateral condyle of left tibia, subsequent encounter for closed fracture with routine healing: Secondary | ICD-10-CM | POA: Diagnosis not present

## 2019-08-02 DIAGNOSIS — I110 Hypertensive heart disease with heart failure: Secondary | ICD-10-CM | POA: Diagnosis not present

## 2019-08-02 DIAGNOSIS — J449 Chronic obstructive pulmonary disease, unspecified: Secondary | ICD-10-CM | POA: Diagnosis not present

## 2019-08-02 DIAGNOSIS — I482 Chronic atrial fibrillation, unspecified: Secondary | ICD-10-CM | POA: Diagnosis not present

## 2019-08-03 DIAGNOSIS — I482 Chronic atrial fibrillation, unspecified: Secondary | ICD-10-CM | POA: Diagnosis not present

## 2019-08-03 DIAGNOSIS — I110 Hypertensive heart disease with heart failure: Secondary | ICD-10-CM | POA: Diagnosis not present

## 2019-08-03 DIAGNOSIS — S82122D Displaced fracture of lateral condyle of left tibia, subsequent encounter for closed fracture with routine healing: Secondary | ICD-10-CM | POA: Diagnosis not present

## 2019-08-03 DIAGNOSIS — I69351 Hemiplegia and hemiparesis following cerebral infarction affecting right dominant side: Secondary | ICD-10-CM | POA: Diagnosis not present

## 2019-08-03 DIAGNOSIS — I5042 Chronic combined systolic (congestive) and diastolic (congestive) heart failure: Secondary | ICD-10-CM | POA: Diagnosis not present

## 2019-08-03 DIAGNOSIS — J449 Chronic obstructive pulmonary disease, unspecified: Secondary | ICD-10-CM | POA: Diagnosis not present

## 2019-08-30 DIAGNOSIS — I495 Sick sinus syndrome: Secondary | ICD-10-CM | POA: Diagnosis not present

## 2019-09-13 DIAGNOSIS — E8881 Metabolic syndrome: Secondary | ICD-10-CM | POA: Diagnosis not present

## 2019-09-13 DIAGNOSIS — I10 Essential (primary) hypertension: Secondary | ICD-10-CM | POA: Diagnosis not present

## 2019-09-13 DIAGNOSIS — N183 Chronic kidney disease, stage 3 unspecified: Secondary | ICD-10-CM | POA: Diagnosis not present

## 2019-09-13 DIAGNOSIS — I5032 Chronic diastolic (congestive) heart failure: Secondary | ICD-10-CM | POA: Diagnosis not present

## 2019-09-13 DIAGNOSIS — K219 Gastro-esophageal reflux disease without esophagitis: Secondary | ICD-10-CM | POA: Diagnosis not present

## 2019-09-13 DIAGNOSIS — E782 Mixed hyperlipidemia: Secondary | ICD-10-CM | POA: Diagnosis not present

## 2019-09-13 DIAGNOSIS — R7301 Impaired fasting glucose: Secondary | ICD-10-CM | POA: Diagnosis not present

## 2019-09-15 DIAGNOSIS — E782 Mixed hyperlipidemia: Secondary | ICD-10-CM | POA: Diagnosis not present

## 2019-09-15 DIAGNOSIS — I10 Essential (primary) hypertension: Secondary | ICD-10-CM | POA: Diagnosis not present

## 2019-09-15 DIAGNOSIS — Z0001 Encounter for general adult medical examination with abnormal findings: Secondary | ICD-10-CM | POA: Diagnosis not present

## 2019-09-15 DIAGNOSIS — R300 Dysuria: Secondary | ICD-10-CM | POA: Diagnosis not present

## 2019-09-15 DIAGNOSIS — E8881 Metabolic syndrome: Secondary | ICD-10-CM | POA: Diagnosis not present

## 2019-09-15 DIAGNOSIS — I482 Chronic atrial fibrillation, unspecified: Secondary | ICD-10-CM | POA: Diagnosis not present

## 2019-09-15 DIAGNOSIS — G8194 Hemiplegia, unspecified affecting left nondominant side: Secondary | ICD-10-CM | POA: Diagnosis not present

## 2019-09-15 DIAGNOSIS — I4891 Unspecified atrial fibrillation: Secondary | ICD-10-CM | POA: Diagnosis not present

## 2019-09-15 DIAGNOSIS — R7301 Impaired fasting glucose: Secondary | ICD-10-CM | POA: Diagnosis not present

## 2019-09-15 DIAGNOSIS — M545 Low back pain: Secondary | ICD-10-CM | POA: Diagnosis not present

## 2019-09-15 DIAGNOSIS — I5032 Chronic diastolic (congestive) heart failure: Secondary | ICD-10-CM | POA: Diagnosis not present

## 2019-09-15 DIAGNOSIS — N183 Chronic kidney disease, stage 3 unspecified: Secondary | ICD-10-CM | POA: Diagnosis not present

## 2019-09-15 DIAGNOSIS — K219 Gastro-esophageal reflux disease without esophagitis: Secondary | ICD-10-CM | POA: Diagnosis not present

## 2019-09-15 DIAGNOSIS — F331 Major depressive disorder, recurrent, moderate: Secondary | ICD-10-CM | POA: Diagnosis not present

## 2019-09-17 DIAGNOSIS — I10 Essential (primary) hypertension: Secondary | ICD-10-CM | POA: Diagnosis not present

## 2019-09-17 DIAGNOSIS — E7849 Other hyperlipidemia: Secondary | ICD-10-CM | POA: Diagnosis not present

## 2019-09-19 DIAGNOSIS — N3090 Cystitis, unspecified without hematuria: Secondary | ICD-10-CM | POA: Diagnosis not present

## 2019-09-29 ENCOUNTER — Telehealth: Payer: Self-pay | Admitting: Cardiology

## 2019-09-29 NOTE — Telephone Encounter (Signed)
Spoke with patient, she stated she didn't want to come anymore. 09/29/19 AF

## 2019-09-30 DIAGNOSIS — Z23 Encounter for immunization: Secondary | ICD-10-CM | POA: Diagnosis not present

## 2019-10-11 DIAGNOSIS — Z8673 Personal history of transient ischemic attack (TIA), and cerebral infarction without residual deficits: Secondary | ICD-10-CM | POA: Insufficient documentation

## 2019-10-11 DIAGNOSIS — Z950 Presence of cardiac pacemaker: Secondary | ICD-10-CM | POA: Diagnosis not present

## 2019-10-11 DIAGNOSIS — I350 Nonrheumatic aortic (valve) stenosis: Secondary | ICD-10-CM | POA: Insufficient documentation

## 2019-10-11 DIAGNOSIS — Z6822 Body mass index (BMI) 22.0-22.9, adult: Secondary | ICD-10-CM | POA: Diagnosis not present

## 2019-10-11 DIAGNOSIS — I5032 Chronic diastolic (congestive) heart failure: Secondary | ICD-10-CM | POA: Diagnosis not present

## 2019-10-11 DIAGNOSIS — I4821 Permanent atrial fibrillation: Secondary | ICD-10-CM | POA: Diagnosis not present

## 2019-10-15 DIAGNOSIS — E7849 Other hyperlipidemia: Secondary | ICD-10-CM | POA: Diagnosis not present

## 2019-10-15 DIAGNOSIS — I10 Essential (primary) hypertension: Secondary | ICD-10-CM | POA: Diagnosis not present

## 2019-10-20 DIAGNOSIS — I4821 Permanent atrial fibrillation: Secondary | ICD-10-CM | POA: Diagnosis not present

## 2019-10-20 DIAGNOSIS — I350 Nonrheumatic aortic (valve) stenosis: Secondary | ICD-10-CM | POA: Diagnosis not present

## 2019-10-20 DIAGNOSIS — I352 Nonrheumatic aortic (valve) stenosis with insufficiency: Secondary | ICD-10-CM | POA: Diagnosis not present

## 2019-10-25 DIAGNOSIS — I4821 Permanent atrial fibrillation: Secondary | ICD-10-CM | POA: Diagnosis not present

## 2019-10-27 IMAGING — CT CT ANGIO CHEST
3 of 7 series · 18 of 36 positions shown · IV contrast (Isovue)
Comparison: 03/21/2018 and 01/21/2018 CXR, chest CT 06/01/2016

CLINICAL DATA: Dyspnea since this morning without chest pain.

EXAM:
CT ANGIOGRAPHY CHEST WITH CONTRAST
TECHNIQUE: Multidetector CT imaging of the chest was performed using the
standard protocol during bolus administration of intravenous
contrast. Multiplanar CT image reconstructions and MIPs were
obtained to evaluate the vascular anatomy.
CONTRAST:  100mL XB5DY9-HS2 IOPAMIDOL (XB5DY9-HS2) INJECTION 76%

[Series 7: thins · axial · 0.72mm/px · z∈[+33,+267]mm · 13 of 274 slices shown]
[im 20/274  lung]
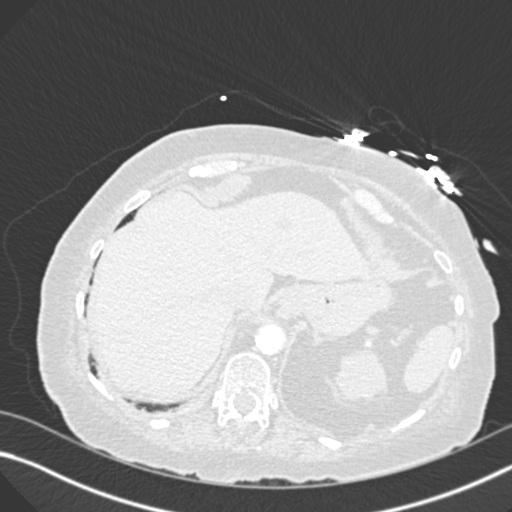
[im 40/274  mediastinal]
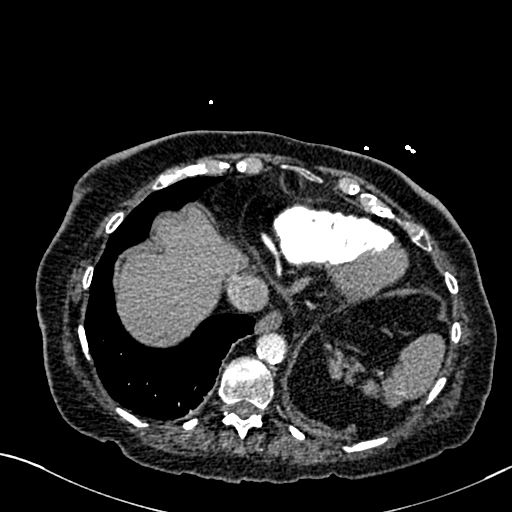
[im 59/274  lung]
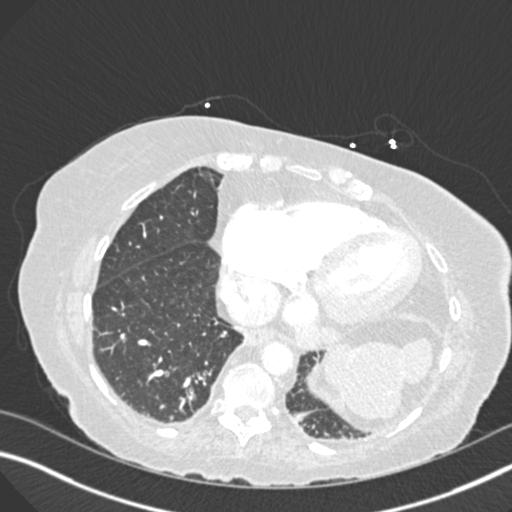
[im 79/274  mediastinal]
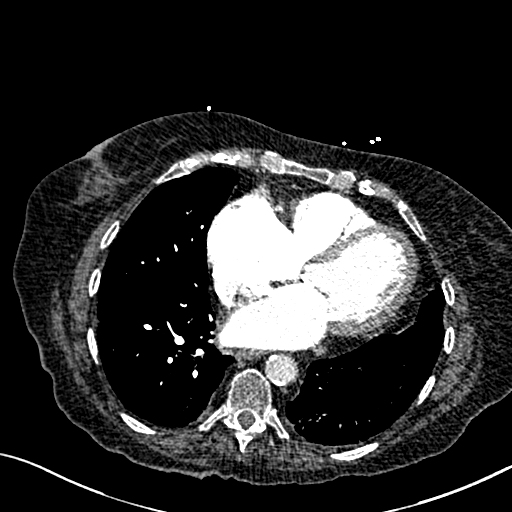
[im 98/274  lung]
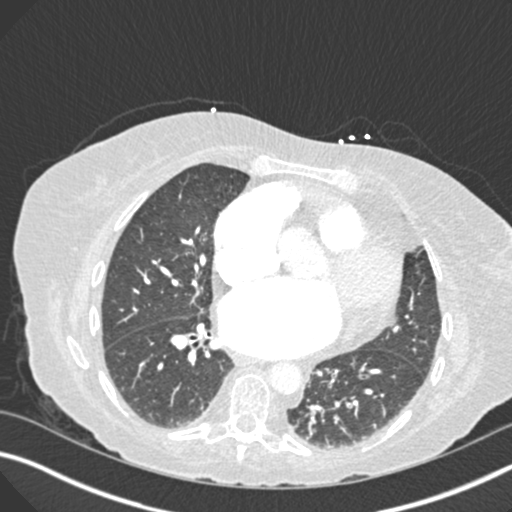
[im 118/274  mediastinal]
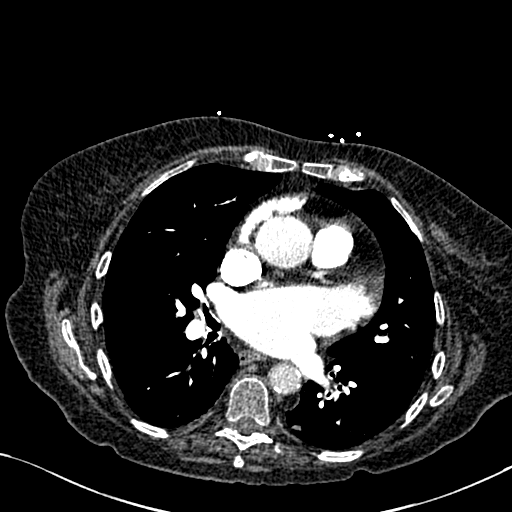
[im 137/274  lung]
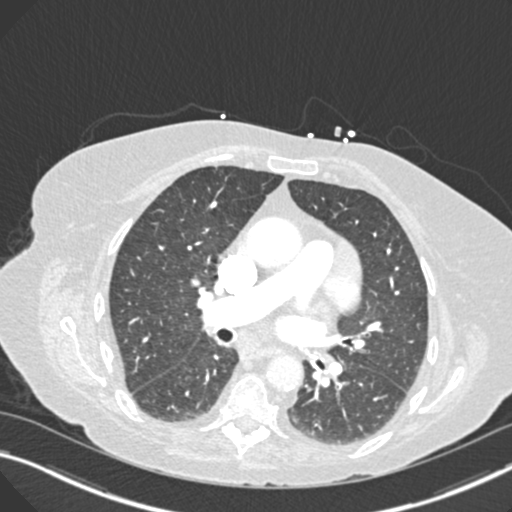
[im 157/274  mediastinal]
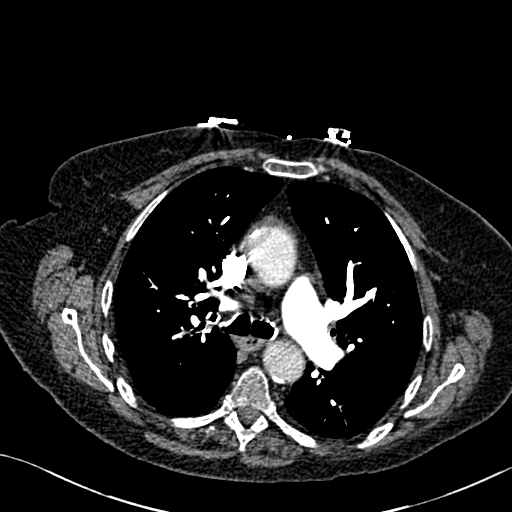
[im 176/274  lung]
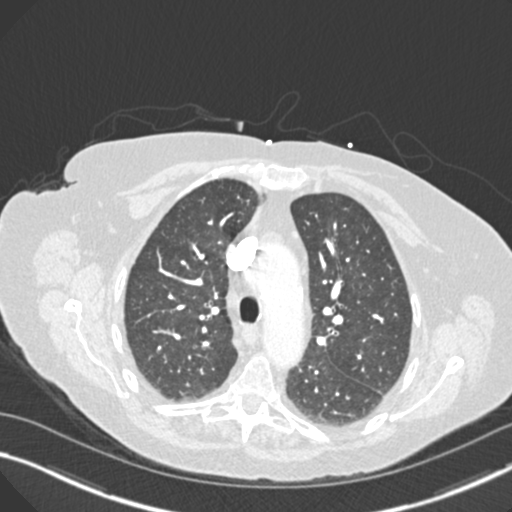
[im 196/274  mediastinal]
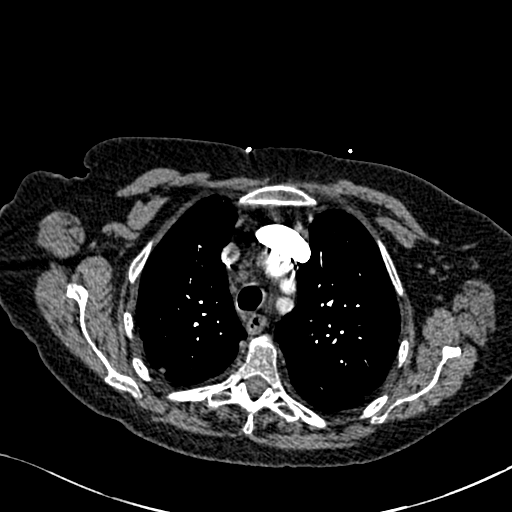
[im 215/274  lung]
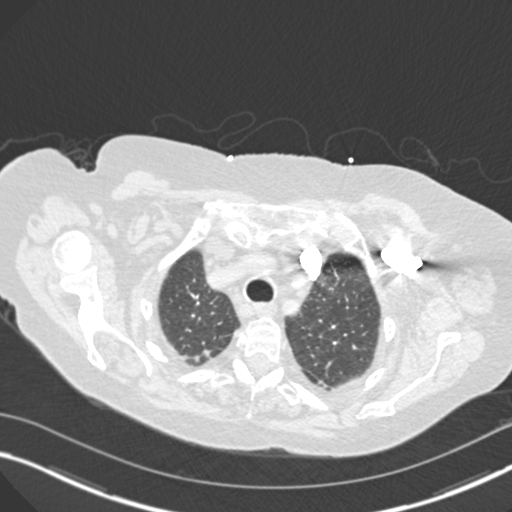
[im 235/274  mediastinal]
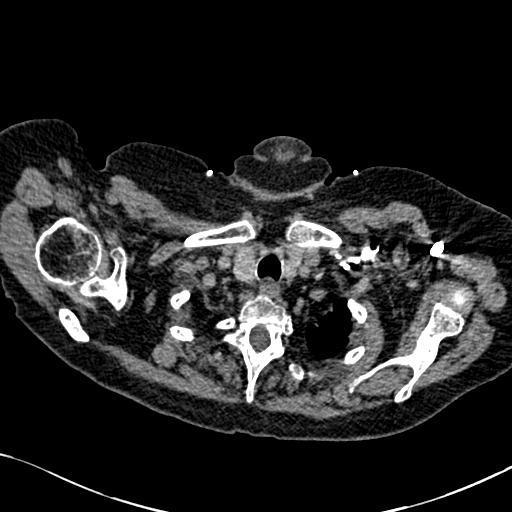
[im 254/274  lung]
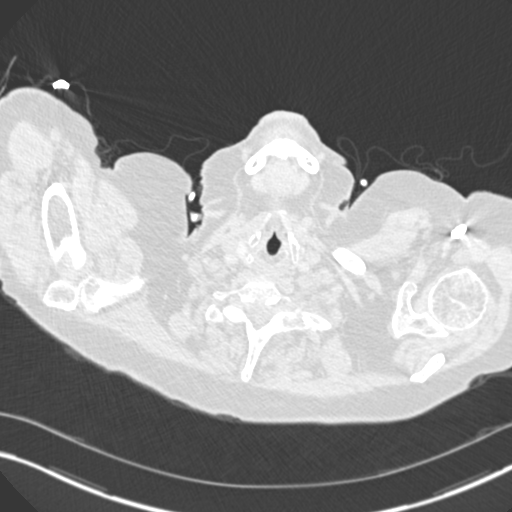

[Series 8: lung · axial · 0.72mm/px · z∈[+53,+169]mm · 4 of 137 slices shown]
[im 20/137  mediastinal]
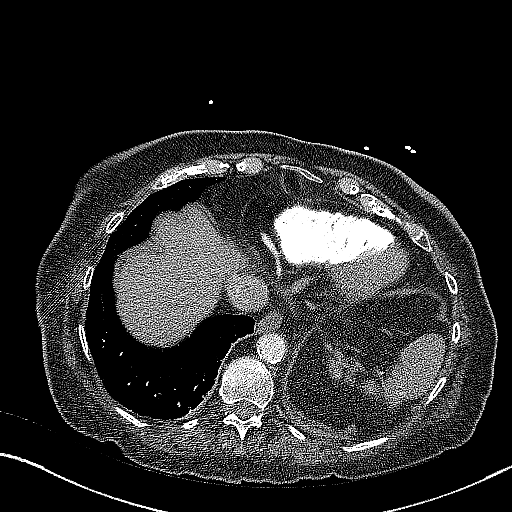
[im 39/137  mediastinal]
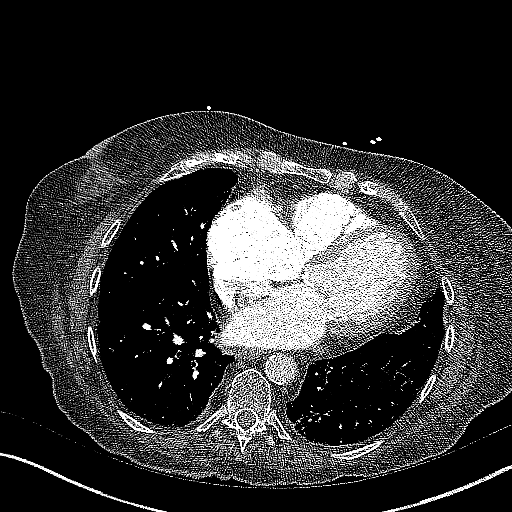
[im 59/137  mediastinal]
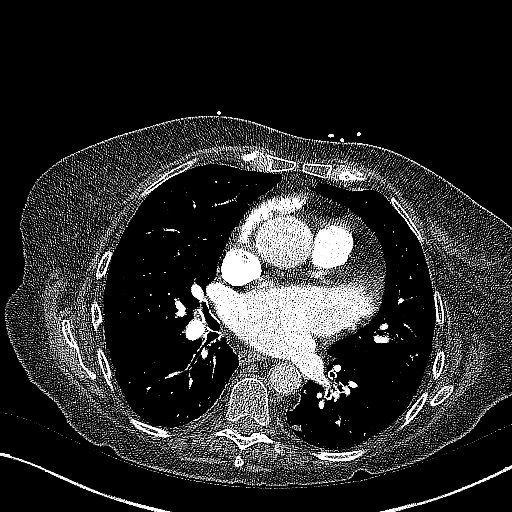
[im 78/137  mediastinal]
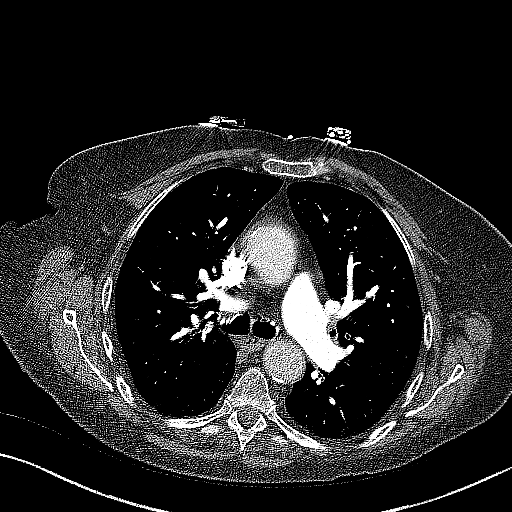

[Series 9: coronal mpr · coronal · 0.58mm/px · 1 of 140 slices shown]
[im 70/140  mediastinal]
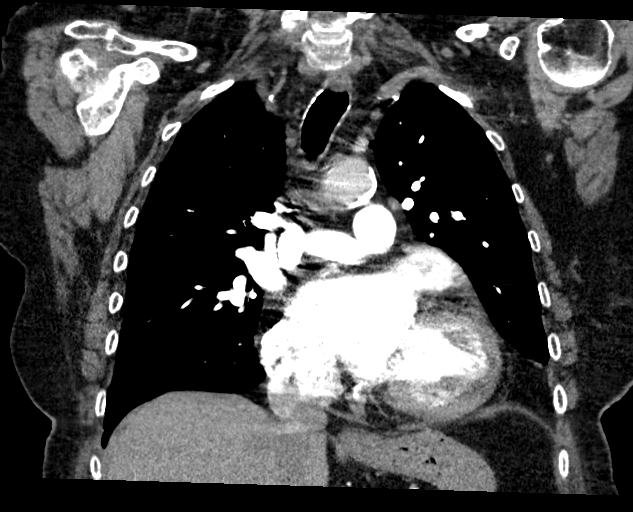

[18 of 36 positions shown; findings below may reference images not displayed]

FINDINGS: Cardiovascular: Satisfactory opacification of the pulmonary arteries
to the segmental level. No evidence of pulmonary embolism.
Cardiomegaly without pericardial effusion. Minimal coronary
arteriosclerosis along the left circumflex. Nonaneurysmal
atherosclerotic aorta is identified.

Mediastinum/Nodes: Mild retroclavicular extension of the thyroid
gland without dominant mass seen. Patent midline trachea and
mainstem bronchi. No axillary, mediastinal or hilar lymphadenopathy.
Unremarkable CT appearance of the esophagus.

Lungs/Pleura: Biapical pleuroparenchymal scarring. No pulmonary
edema, effusion or pneumothorax. 7 mm nodular densities are noted in
the left lower lobe medially. Subsegmental scarring is also noted at
the base.

Upper Abdomen: Stable left hepatic 9 mm hypodensity dating back to
6681 consistent with a benign finding, statistically more likely to
represent a cyst or hemangioma. Smaller hypodensity in the right
hepatic lobe measuring 6 mm. No biliary dilatation. No splenomegaly.

Musculoskeletal: No chest wall abnormality. Mild inferior endplate
compression of T8 unchanged relative to 01/21/2018 chest radiograph.

Review of the MIP images confirms the above findings.
IMPRESSION: 1. Cardiomegaly with minimal coronary arteriosclerosis and aortic
atherosclerosis.
2. No acute pulmonary disease.
3. No acute pulmonary embolus.
4. 7 mm nodular opacities in medial left lower lobe. Non-contrast
chest CT at 3-6 months is recommended. If the nodules are stable at
time of repeat CT, then future CT at 18-24 months (from today's
scan) is considered optional for low-risk patients, but is
recommended for high-risk patients. This recommendation follows the
consensus statement: Guidelines for Management of Incidental
Pulmonary Nodules Detected on CT Images: From the [HOSPITAL]

## 2019-10-28 DIAGNOSIS — Z23 Encounter for immunization: Secondary | ICD-10-CM | POA: Diagnosis not present

## 2019-11-05 DIAGNOSIS — L97511 Non-pressure chronic ulcer of other part of right foot limited to breakdown of skin: Secondary | ICD-10-CM | POA: Diagnosis not present

## 2019-11-06 DIAGNOSIS — R609 Edema, unspecified: Secondary | ICD-10-CM | POA: Diagnosis not present

## 2019-11-06 DIAGNOSIS — S3282XA Multiple fractures of pelvis without disruption of pelvic ring, initial encounter for closed fracture: Secondary | ICD-10-CM | POA: Diagnosis not present

## 2019-11-06 DIAGNOSIS — Z7409 Other reduced mobility: Secondary | ICD-10-CM | POA: Diagnosis not present

## 2019-11-06 DIAGNOSIS — Z950 Presence of cardiac pacemaker: Secondary | ICD-10-CM | POA: Diagnosis not present

## 2019-11-06 DIAGNOSIS — S329XXA Fracture of unspecified parts of lumbosacral spine and pelvis, initial encounter for closed fracture: Secondary | ICD-10-CM | POA: Diagnosis not present

## 2019-11-06 DIAGNOSIS — S32591A Other specified fracture of right pubis, initial encounter for closed fracture: Secondary | ICD-10-CM | POA: Diagnosis not present

## 2019-11-06 DIAGNOSIS — S32502A Unspecified fracture of left pubis, initial encounter for closed fracture: Secondary | ICD-10-CM | POA: Diagnosis not present

## 2019-11-06 DIAGNOSIS — S32602A Unspecified fracture of left ischium, initial encounter for closed fracture: Secondary | ICD-10-CM | POA: Diagnosis not present

## 2019-11-06 DIAGNOSIS — Z049 Encounter for examination and observation for unspecified reason: Secondary | ICD-10-CM | POA: Diagnosis not present

## 2019-11-06 DIAGNOSIS — S72002A Fracture of unspecified part of neck of left femur, initial encounter for closed fracture: Secondary | ICD-10-CM | POA: Diagnosis not present

## 2019-11-06 DIAGNOSIS — S32119A Unspecified Zone I fracture of sacrum, initial encounter for closed fracture: Secondary | ICD-10-CM | POA: Diagnosis not present

## 2019-11-06 DIAGNOSIS — M79641 Pain in right hand: Secondary | ICD-10-CM | POA: Diagnosis not present

## 2019-11-06 DIAGNOSIS — S2241XA Multiple fractures of ribs, right side, initial encounter for closed fracture: Secondary | ICD-10-CM | POA: Diagnosis not present

## 2019-11-06 DIAGNOSIS — Y9301 Activity, walking, marching and hiking: Secondary | ICD-10-CM | POA: Diagnosis not present

## 2019-11-06 DIAGNOSIS — M542 Cervicalgia: Secondary | ICD-10-CM | POA: Diagnosis not present

## 2019-11-06 DIAGNOSIS — I509 Heart failure, unspecified: Secondary | ICD-10-CM | POA: Diagnosis not present

## 2019-11-06 DIAGNOSIS — R220 Localized swelling, mass and lump, head: Secondary | ICD-10-CM | POA: Diagnosis not present

## 2019-11-06 DIAGNOSIS — M25522 Pain in left elbow: Secondary | ICD-10-CM | POA: Diagnosis not present

## 2019-11-06 DIAGNOSIS — Z79899 Other long term (current) drug therapy: Secondary | ICD-10-CM | POA: Diagnosis not present

## 2019-11-06 DIAGNOSIS — S0083XA Contusion of other part of head, initial encounter: Secondary | ICD-10-CM | POA: Diagnosis not present

## 2019-11-06 DIAGNOSIS — Z20822 Contact with and (suspected) exposure to covid-19: Secondary | ICD-10-CM | POA: Diagnosis not present

## 2019-11-06 DIAGNOSIS — Z043 Encounter for examination and observation following other accident: Secondary | ICD-10-CM | POA: Diagnosis not present

## 2019-11-06 DIAGNOSIS — S6991XA Unspecified injury of right wrist, hand and finger(s), initial encounter: Secondary | ICD-10-CM | POA: Diagnosis not present

## 2019-11-06 DIAGNOSIS — S32492A Other specified fracture of left acetabulum, initial encounter for closed fracture: Secondary | ICD-10-CM | POA: Diagnosis not present

## 2019-11-06 DIAGNOSIS — Z66 Do not resuscitate: Secondary | ICD-10-CM | POA: Diagnosis not present

## 2019-11-06 DIAGNOSIS — S60312A Abrasion of left thumb, initial encounter: Secondary | ICD-10-CM | POA: Diagnosis not present

## 2019-11-06 DIAGNOSIS — Z7901 Long term (current) use of anticoagulants: Secondary | ICD-10-CM | POA: Diagnosis not present

## 2019-11-06 DIAGNOSIS — S0101XA Laceration without foreign body of scalp, initial encounter: Secondary | ICD-10-CM | POA: Diagnosis not present

## 2019-11-06 DIAGNOSIS — S299XXA Unspecified injury of thorax, initial encounter: Secondary | ICD-10-CM | POA: Diagnosis not present

## 2019-11-06 DIAGNOSIS — I110 Hypertensive heart disease with heart failure: Secondary | ICD-10-CM | POA: Diagnosis not present

## 2019-11-06 DIAGNOSIS — S32601A Unspecified fracture of right ischium, initial encounter for closed fracture: Secondary | ICD-10-CM | POA: Diagnosis not present

## 2019-11-06 DIAGNOSIS — S301XXA Contusion of abdominal wall, initial encounter: Secondary | ICD-10-CM | POA: Diagnosis not present

## 2019-11-06 DIAGNOSIS — S32592A Other specified fracture of left pubis, initial encounter for closed fracture: Secondary | ICD-10-CM | POA: Diagnosis not present

## 2019-11-06 DIAGNOSIS — Z8673 Personal history of transient ischemic attack (TIA), and cerebral infarction without residual deficits: Secondary | ICD-10-CM | POA: Diagnosis not present

## 2019-11-06 DIAGNOSIS — I4891 Unspecified atrial fibrillation: Secondary | ICD-10-CM | POA: Diagnosis not present

## 2019-11-06 DIAGNOSIS — S0990XA Unspecified injury of head, initial encounter: Secondary | ICD-10-CM | POA: Diagnosis not present

## 2019-11-06 DIAGNOSIS — W19XXXA Unspecified fall, initial encounter: Secondary | ICD-10-CM | POA: Diagnosis not present

## 2019-11-06 DIAGNOSIS — M545 Low back pain: Secondary | ICD-10-CM | POA: Diagnosis not present

## 2019-11-06 DIAGNOSIS — R52 Pain, unspecified: Secondary | ICD-10-CM | POA: Diagnosis not present

## 2019-11-06 DIAGNOSIS — S32511A Fracture of superior rim of right pubis, initial encounter for closed fracture: Secondary | ICD-10-CM | POA: Diagnosis not present

## 2019-11-06 DIAGNOSIS — S32432A Displaced fracture of anterior column [iliopubic] of left acetabulum, initial encounter for closed fracture: Secondary | ICD-10-CM | POA: Diagnosis not present

## 2019-11-06 DIAGNOSIS — S0003XA Contusion of scalp, initial encounter: Secondary | ICD-10-CM | POA: Diagnosis not present

## 2019-11-06 DIAGNOSIS — W01198A Fall on same level from slipping, tripping and stumbling with subsequent striking against other object, initial encounter: Secondary | ICD-10-CM | POA: Diagnosis not present

## 2019-11-06 DIAGNOSIS — S0181XA Laceration without foreign body of other part of head, initial encounter: Secondary | ICD-10-CM | POA: Diagnosis not present

## 2019-11-06 DIAGNOSIS — G9341 Metabolic encephalopathy: Secondary | ICD-10-CM | POA: Diagnosis not present

## 2019-11-06 DIAGNOSIS — I10 Essential (primary) hypertension: Secondary | ICD-10-CM | POA: Diagnosis not present

## 2019-11-06 DIAGNOSIS — S0993XA Unspecified injury of face, initial encounter: Secondary | ICD-10-CM | POA: Diagnosis not present

## 2019-11-06 DIAGNOSIS — S199XXA Unspecified injury of neck, initial encounter: Secondary | ICD-10-CM | POA: Diagnosis not present

## 2019-11-06 DIAGNOSIS — I482 Chronic atrial fibrillation, unspecified: Secondary | ICD-10-CM | POA: Diagnosis not present

## 2019-11-06 DIAGNOSIS — Y998 Other external cause status: Secondary | ICD-10-CM | POA: Diagnosis not present

## 2019-11-07 DIAGNOSIS — I5042 Chronic combined systolic (congestive) and diastolic (congestive) heart failure: Secondary | ICD-10-CM | POA: Diagnosis not present

## 2019-11-07 DIAGNOSIS — Z043 Encounter for examination and observation following other accident: Secondary | ICD-10-CM | POA: Diagnosis not present

## 2019-11-07 DIAGNOSIS — R2689 Other abnormalities of gait and mobility: Secondary | ICD-10-CM | POA: Diagnosis present

## 2019-11-07 DIAGNOSIS — R0902 Hypoxemia: Secondary | ICD-10-CM | POA: Diagnosis not present

## 2019-11-07 DIAGNOSIS — S32501D Unspecified fracture of right pubis, subsequent encounter for fracture with routine healing: Secondary | ICD-10-CM | POA: Diagnosis not present

## 2019-11-07 DIAGNOSIS — D62 Acute posthemorrhagic anemia: Secondary | ICD-10-CM | POA: Diagnosis not present

## 2019-11-07 DIAGNOSIS — F329 Major depressive disorder, single episode, unspecified: Secondary | ICD-10-CM | POA: Diagnosis not present

## 2019-11-07 DIAGNOSIS — N183 Chronic kidney disease, stage 3 unspecified: Secondary | ICD-10-CM | POA: Diagnosis present

## 2019-11-07 DIAGNOSIS — S2241XA Multiple fractures of ribs, right side, initial encounter for closed fracture: Secondary | ICD-10-CM | POA: Diagnosis present

## 2019-11-07 DIAGNOSIS — I272 Pulmonary hypertension, unspecified: Secondary | ICD-10-CM | POA: Diagnosis present

## 2019-11-07 DIAGNOSIS — F4321 Adjustment disorder with depressed mood: Secondary | ICD-10-CM | POA: Diagnosis present

## 2019-11-07 DIAGNOSIS — S2241XD Multiple fractures of ribs, right side, subsequent encounter for fracture with routine healing: Secondary | ICD-10-CM | POA: Diagnosis not present

## 2019-11-07 DIAGNOSIS — M2550 Pain in unspecified joint: Secondary | ICD-10-CM | POA: Diagnosis not present

## 2019-11-07 DIAGNOSIS — Z66 Do not resuscitate: Secondary | ICD-10-CM | POA: Diagnosis present

## 2019-11-07 DIAGNOSIS — I5032 Chronic diastolic (congestive) heart failure: Secondary | ICD-10-CM | POA: Diagnosis present

## 2019-11-07 DIAGNOSIS — R2681 Unsteadiness on feet: Secondary | ICD-10-CM | POA: Diagnosis not present

## 2019-11-07 DIAGNOSIS — R413 Other amnesia: Secondary | ICD-10-CM | POA: Diagnosis present

## 2019-11-07 DIAGNOSIS — I679 Cerebrovascular disease, unspecified: Secondary | ICD-10-CM | POA: Diagnosis not present

## 2019-11-07 DIAGNOSIS — S0003XD Contusion of scalp, subsequent encounter: Secondary | ICD-10-CM | POA: Diagnosis not present

## 2019-11-07 DIAGNOSIS — T1490XA Injury, unspecified, initial encounter: Secondary | ICD-10-CM | POA: Insufficient documentation

## 2019-11-07 DIAGNOSIS — Z7901 Long term (current) use of anticoagulants: Secondary | ICD-10-CM | POA: Diagnosis not present

## 2019-11-07 DIAGNOSIS — S32591A Other specified fracture of right pubis, initial encounter for closed fracture: Secondary | ICD-10-CM | POA: Diagnosis present

## 2019-11-07 DIAGNOSIS — I4891 Unspecified atrial fibrillation: Secondary | ICD-10-CM | POA: Diagnosis not present

## 2019-11-07 DIAGNOSIS — Z7952 Long term (current) use of systemic steroids: Secondary | ICD-10-CM | POA: Diagnosis not present

## 2019-11-07 DIAGNOSIS — I10 Essential (primary) hypertension: Secondary | ICD-10-CM | POA: Diagnosis not present

## 2019-11-07 DIAGNOSIS — I482 Chronic atrial fibrillation, unspecified: Secondary | ICD-10-CM | POA: Diagnosis present

## 2019-11-07 DIAGNOSIS — M6281 Muscle weakness (generalized): Secondary | ICD-10-CM | POA: Diagnosis not present

## 2019-11-07 DIAGNOSIS — S32502D Unspecified fracture of left pubis, subsequent encounter for fracture with routine healing: Secondary | ICD-10-CM | POA: Diagnosis not present

## 2019-11-07 DIAGNOSIS — I130 Hypertensive heart and chronic kidney disease with heart failure and stage 1 through stage 4 chronic kidney disease, or unspecified chronic kidney disease: Secondary | ICD-10-CM | POA: Diagnosis present

## 2019-11-07 DIAGNOSIS — Z7401 Bed confinement status: Secondary | ICD-10-CM | POA: Diagnosis not present

## 2019-11-07 DIAGNOSIS — R531 Weakness: Secondary | ICD-10-CM | POA: Diagnosis not present

## 2019-11-07 DIAGNOSIS — F331 Major depressive disorder, recurrent, moderate: Secondary | ICD-10-CM | POA: Diagnosis not present

## 2019-11-07 DIAGNOSIS — R45851 Suicidal ideations: Secondary | ICD-10-CM | POA: Diagnosis present

## 2019-11-07 DIAGNOSIS — Z20822 Contact with and (suspected) exposure to covid-19: Secondary | ICD-10-CM | POA: Diagnosis present

## 2019-11-07 DIAGNOSIS — Z950 Presence of cardiac pacemaker: Secondary | ICD-10-CM | POA: Diagnosis not present

## 2019-11-07 DIAGNOSIS — Z8673 Personal history of transient ischemic attack (TIA), and cerebral infarction without residual deficits: Secondary | ICD-10-CM | POA: Diagnosis not present

## 2019-11-07 DIAGNOSIS — R262 Difficulty in walking, not elsewhere classified: Secondary | ICD-10-CM | POA: Diagnosis not present

## 2019-11-07 DIAGNOSIS — E785 Hyperlipidemia, unspecified: Secondary | ICD-10-CM | POA: Diagnosis not present

## 2019-11-07 DIAGNOSIS — I959 Hypotension, unspecified: Secondary | ICD-10-CM | POA: Diagnosis not present

## 2019-11-07 DIAGNOSIS — S32592A Other specified fracture of left pubis, initial encounter for closed fracture: Secondary | ICD-10-CM | POA: Diagnosis present

## 2019-11-07 DIAGNOSIS — Z9181 History of falling: Secondary | ICD-10-CM | POA: Diagnosis not present

## 2019-11-07 DIAGNOSIS — I129 Hypertensive chronic kidney disease with stage 1 through stage 4 chronic kidney disease, or unspecified chronic kidney disease: Secondary | ICD-10-CM | POA: Diagnosis not present

## 2019-11-07 DIAGNOSIS — G4700 Insomnia, unspecified: Secondary | ICD-10-CM | POA: Diagnosis present

## 2019-11-07 DIAGNOSIS — R0689 Other abnormalities of breathing: Secondary | ICD-10-CM | POA: Diagnosis not present

## 2019-11-07 DIAGNOSIS — G9341 Metabolic encephalopathy: Secondary | ICD-10-CM | POA: Diagnosis present

## 2019-11-08 DIAGNOSIS — S2241XD Multiple fractures of ribs, right side, subsequent encounter for fracture with routine healing: Secondary | ICD-10-CM | POA: Insufficient documentation

## 2019-11-08 DIAGNOSIS — S32591A Other specified fracture of right pubis, initial encounter for closed fracture: Secondary | ICD-10-CM | POA: Insufficient documentation

## 2019-11-11 DIAGNOSIS — I10 Essential (primary) hypertension: Secondary | ICD-10-CM | POA: Diagnosis not present

## 2019-11-11 DIAGNOSIS — S82302D Unspecified fracture of lower end of left tibia, subsequent encounter for closed fracture with routine healing: Secondary | ICD-10-CM | POA: Diagnosis not present

## 2019-11-11 DIAGNOSIS — M2550 Pain in unspecified joint: Secondary | ICD-10-CM | POA: Diagnosis not present

## 2019-11-11 DIAGNOSIS — I4891 Unspecified atrial fibrillation: Secondary | ICD-10-CM | POA: Diagnosis not present

## 2019-11-11 DIAGNOSIS — S329XXA Fracture of unspecified parts of lumbosacral spine and pelvis, initial encounter for closed fracture: Secondary | ICD-10-CM | POA: Diagnosis not present

## 2019-11-11 DIAGNOSIS — S2241XA Multiple fractures of ribs, right side, initial encounter for closed fracture: Secondary | ICD-10-CM | POA: Diagnosis not present

## 2019-11-11 DIAGNOSIS — R531 Weakness: Secondary | ICD-10-CM | POA: Diagnosis not present

## 2019-11-11 DIAGNOSIS — R110 Nausea: Secondary | ICD-10-CM | POA: Diagnosis not present

## 2019-11-11 DIAGNOSIS — S32501D Unspecified fracture of right pubis, subsequent encounter for fracture with routine healing: Secondary | ICD-10-CM | POA: Diagnosis not present

## 2019-11-11 DIAGNOSIS — I959 Hypotension, unspecified: Secondary | ICD-10-CM | POA: Diagnosis not present

## 2019-11-11 DIAGNOSIS — S82832D Other fracture of upper and lower end of left fibula, subsequent encounter for closed fracture with routine healing: Secondary | ICD-10-CM | POA: Diagnosis not present

## 2019-11-11 DIAGNOSIS — R262 Difficulty in walking, not elsewhere classified: Secondary | ICD-10-CM | POA: Diagnosis not present

## 2019-11-11 DIAGNOSIS — S32592A Other specified fracture of left pubis, initial encounter for closed fracture: Secondary | ICD-10-CM | POA: Diagnosis not present

## 2019-11-11 DIAGNOSIS — F329 Major depressive disorder, single episode, unspecified: Secondary | ICD-10-CM | POA: Diagnosis not present

## 2019-11-11 DIAGNOSIS — M79662 Pain in left lower leg: Secondary | ICD-10-CM | POA: Diagnosis not present

## 2019-11-11 DIAGNOSIS — S3282XD Multiple fractures of pelvis without disruption of pelvic ring, subsequent encounter for fracture with routine healing: Secondary | ICD-10-CM | POA: Diagnosis not present

## 2019-11-11 DIAGNOSIS — I5042 Chronic combined systolic (congestive) and diastolic (congestive) heart failure: Secondary | ICD-10-CM | POA: Diagnosis not present

## 2019-11-11 DIAGNOSIS — R4182 Altered mental status, unspecified: Secondary | ICD-10-CM | POA: Diagnosis not present

## 2019-11-11 DIAGNOSIS — Z7401 Bed confinement status: Secondary | ICD-10-CM | POA: Diagnosis not present

## 2019-11-11 DIAGNOSIS — S0003XD Contusion of scalp, subsequent encounter: Secondary | ICD-10-CM | POA: Diagnosis not present

## 2019-11-11 DIAGNOSIS — I679 Cerebrovascular disease, unspecified: Secondary | ICD-10-CM | POA: Diagnosis not present

## 2019-11-11 DIAGNOSIS — F331 Major depressive disorder, recurrent, moderate: Secondary | ICD-10-CM | POA: Diagnosis not present

## 2019-11-11 DIAGNOSIS — K224 Dyskinesia of esophagus: Secondary | ICD-10-CM | POA: Diagnosis not present

## 2019-11-11 DIAGNOSIS — Z23 Encounter for immunization: Secondary | ICD-10-CM | POA: Diagnosis not present

## 2019-11-11 DIAGNOSIS — M1712 Unilateral primary osteoarthritis, left knee: Secondary | ICD-10-CM | POA: Diagnosis not present

## 2019-11-11 DIAGNOSIS — R0902 Hypoxemia: Secondary | ICD-10-CM | POA: Diagnosis not present

## 2019-11-11 DIAGNOSIS — S32591A Other specified fracture of right pubis, initial encounter for closed fracture: Secondary | ICD-10-CM | POA: Diagnosis not present

## 2019-11-11 DIAGNOSIS — R1310 Dysphagia, unspecified: Secondary | ICD-10-CM | POA: Diagnosis not present

## 2019-11-11 DIAGNOSIS — M545 Low back pain: Secondary | ICD-10-CM | POA: Diagnosis not present

## 2019-11-11 DIAGNOSIS — S32502D Unspecified fracture of left pubis, subsequent encounter for fracture with routine healing: Secondary | ICD-10-CM | POA: Diagnosis not present

## 2019-11-11 DIAGNOSIS — M6281 Muscle weakness (generalized): Secondary | ICD-10-CM | POA: Diagnosis not present

## 2019-11-11 DIAGNOSIS — S2241XD Multiple fractures of ribs, right side, subsequent encounter for fracture with routine healing: Secondary | ICD-10-CM | POA: Diagnosis not present

## 2019-11-15 DIAGNOSIS — F329 Major depressive disorder, single episode, unspecified: Secondary | ICD-10-CM | POA: Diagnosis not present

## 2019-11-15 DIAGNOSIS — I4891 Unspecified atrial fibrillation: Secondary | ICD-10-CM | POA: Diagnosis not present

## 2019-11-15 DIAGNOSIS — I10 Essential (primary) hypertension: Secondary | ICD-10-CM | POA: Diagnosis not present

## 2019-11-15 DIAGNOSIS — I5042 Chronic combined systolic (congestive) and diastolic (congestive) heart failure: Secondary | ICD-10-CM | POA: Diagnosis not present

## 2019-11-17 DIAGNOSIS — F329 Major depressive disorder, single episode, unspecified: Secondary | ICD-10-CM | POA: Diagnosis not present

## 2019-11-17 DIAGNOSIS — I10 Essential (primary) hypertension: Secondary | ICD-10-CM | POA: Diagnosis not present

## 2019-11-17 DIAGNOSIS — S3282XD Multiple fractures of pelvis without disruption of pelvic ring, subsequent encounter for fracture with routine healing: Secondary | ICD-10-CM | POA: Diagnosis not present

## 2019-11-24 DIAGNOSIS — R4182 Altered mental status, unspecified: Secondary | ICD-10-CM | POA: Diagnosis not present

## 2019-11-25 DIAGNOSIS — R1310 Dysphagia, unspecified: Secondary | ICD-10-CM | POA: Diagnosis not present

## 2019-11-25 DIAGNOSIS — S329XXA Fracture of unspecified parts of lumbosacral spine and pelvis, initial encounter for closed fracture: Secondary | ICD-10-CM | POA: Diagnosis not present

## 2019-11-25 DIAGNOSIS — M79662 Pain in left lower leg: Secondary | ICD-10-CM | POA: Diagnosis not present

## 2019-11-30 DIAGNOSIS — R1310 Dysphagia, unspecified: Secondary | ICD-10-CM | POA: Diagnosis not present

## 2019-12-02 DIAGNOSIS — M79662 Pain in left lower leg: Secondary | ICD-10-CM | POA: Diagnosis not present

## 2019-12-02 DIAGNOSIS — K224 Dyskinesia of esophagus: Secondary | ICD-10-CM | POA: Diagnosis not present

## 2019-12-02 DIAGNOSIS — S3282XD Multiple fractures of pelvis without disruption of pelvic ring, subsequent encounter for fracture with routine healing: Secondary | ICD-10-CM | POA: Diagnosis not present

## 2019-12-03 DIAGNOSIS — M545 Low back pain: Secondary | ICD-10-CM | POA: Diagnosis not present

## 2019-12-03 DIAGNOSIS — M1712 Unilateral primary osteoarthritis, left knee: Secondary | ICD-10-CM | POA: Diagnosis not present

## 2019-12-03 DIAGNOSIS — R110 Nausea: Secondary | ICD-10-CM | POA: Diagnosis not present

## 2019-12-06 ENCOUNTER — Other Ambulatory Visit: Payer: Self-pay | Admitting: *Deleted

## 2019-12-06 NOTE — Patient Outreach (Signed)
[facility] Post-Acute Care Coordinator follow up  [name] [name] is currently receiving skilled therapy at [facility].   Telephone call made to [name] [name] (husband) [phone]. Patient identifiers confirmed. [name] [name] confirms that member remains in facility. Unsure of when she is returning home. [name] [name] confirms best contact number for him as [phone]. States they do not have a land line. [name] [name] mobile is [phone].  Explained [place] [facility] services. [name] [name] is agreeable but asks that writer contact his daughter [name] [name], who is a Marine scientist that lives in [place], to discuss further. States he has a very supportive family and that [name] [name] comes  every weekend. [name] [name] reports he is 86 years old himself. Made [name] [name] aware that [place] [facility] will follow up post SNF dc.   Telephone call made to [name] [name] (daughter) [phone]. Patient identifiers confirmed. [name] [name] reports the plan is for member to return home at SNF dc. States they are trying to keep member and husband in the home as long as possible. States she has caregivers arranged and will increase and adjust hours as needed. States [name] is still working with therapy and will need to be able to get in and out of bed on her own.   Explained [place] [facility] services. [name] [name] is agreeable as well and states she should be contacted since her father is 21 years old and its a lot to keep up with. [name] [name] also states she has tried to get her parents on the mobile meals program but was told they were ineligible due to having a car. [name] [name] confirms neither member or husband drive any longer. Agreeable to [facility] LCSW for meals program and assistance. Agreeable to [place] for care coordination and complex case management.   Will email [place] [facility] contact information, 24-hr nurse advice line number, and website info.   Discussed that writer will continue to follow for transition plans and plan  outreach again if necessary.   Will continue to follow while in SNF. Will plan to make [facility] RNCM and [facility] LCSW referrals upon SNF discharge.    Marthenia Rolling, MSN-Ed, RN,BSN [place] Acute Care Coordinator [phone] [facility]) [phone]  (Toll free office)

## 2019-12-13 DIAGNOSIS — S3282XD Multiple fractures of pelvis without disruption of pelvic ring, subsequent encounter for fracture with routine healing: Secondary | ICD-10-CM | POA: Diagnosis not present

## 2019-12-13 DIAGNOSIS — K224 Dyskinesia of esophagus: Secondary | ICD-10-CM | POA: Diagnosis not present

## 2019-12-13 DIAGNOSIS — I4891 Unspecified atrial fibrillation: Secondary | ICD-10-CM | POA: Diagnosis not present

## 2019-12-13 DIAGNOSIS — I10 Essential (primary) hypertension: Secondary | ICD-10-CM | POA: Diagnosis not present

## 2019-12-14 DIAGNOSIS — K5792 Diverticulitis of intestine, part unspecified, without perforation or abscess without bleeding: Secondary | ICD-10-CM | POA: Diagnosis not present

## 2019-12-14 DIAGNOSIS — F332 Major depressive disorder, recurrent severe without psychotic features: Secondary | ICD-10-CM | POA: Diagnosis not present

## 2019-12-14 DIAGNOSIS — K224 Dyskinesia of esophagus: Secondary | ICD-10-CM | POA: Diagnosis not present

## 2019-12-14 DIAGNOSIS — M1712 Unilateral primary osteoarthritis, left knee: Secondary | ICD-10-CM | POA: Diagnosis not present

## 2019-12-14 DIAGNOSIS — I130 Hypertensive heart and chronic kidney disease with heart failure and stage 1 through stage 4 chronic kidney disease, or unspecified chronic kidney disease: Secondary | ICD-10-CM | POA: Diagnosis not present

## 2019-12-14 DIAGNOSIS — K5790 Diverticulosis of intestine, part unspecified, without perforation or abscess without bleeding: Secondary | ICD-10-CM | POA: Diagnosis not present

## 2019-12-14 DIAGNOSIS — K589 Irritable bowel syndrome without diarrhea: Secondary | ICD-10-CM | POA: Diagnosis not present

## 2019-12-14 DIAGNOSIS — W010XXD Fall on same level from slipping, tripping and stumbling without subsequent striking against object, subsequent encounter: Secondary | ICD-10-CM | POA: Diagnosis not present

## 2019-12-14 DIAGNOSIS — I69398 Other sequelae of cerebral infarction: Secondary | ICD-10-CM | POA: Diagnosis not present

## 2019-12-14 DIAGNOSIS — Z7901 Long term (current) use of anticoagulants: Secondary | ICD-10-CM | POA: Diagnosis not present

## 2019-12-14 DIAGNOSIS — I5032 Chronic diastolic (congestive) heart failure: Secondary | ICD-10-CM | POA: Diagnosis not present

## 2019-12-14 DIAGNOSIS — M800AXD Age-related osteoporosis with current pathological fracture, other site, subsequent encounter for fracture with routine healing: Secondary | ICD-10-CM | POA: Diagnosis not present

## 2019-12-14 DIAGNOSIS — I69351 Hemiplegia and hemiparesis following cerebral infarction affecting right dominant side: Secondary | ICD-10-CM | POA: Diagnosis not present

## 2019-12-14 DIAGNOSIS — R4182 Altered mental status, unspecified: Secondary | ICD-10-CM | POA: Diagnosis not present

## 2019-12-14 DIAGNOSIS — D62 Acute posthemorrhagic anemia: Secondary | ICD-10-CM | POA: Diagnosis not present

## 2019-12-14 DIAGNOSIS — Z7982 Long term (current) use of aspirin: Secondary | ICD-10-CM | POA: Diagnosis not present

## 2019-12-14 DIAGNOSIS — H02409 Unspecified ptosis of unspecified eyelid: Secondary | ICD-10-CM | POA: Diagnosis not present

## 2019-12-14 DIAGNOSIS — I4891 Unspecified atrial fibrillation: Secondary | ICD-10-CM | POA: Diagnosis not present

## 2019-12-14 DIAGNOSIS — R1310 Dysphagia, unspecified: Secondary | ICD-10-CM | POA: Diagnosis not present

## 2019-12-14 DIAGNOSIS — N183 Chronic kidney disease, stage 3 unspecified: Secondary | ICD-10-CM | POA: Diagnosis not present

## 2019-12-14 DIAGNOSIS — J449 Chronic obstructive pulmonary disease, unspecified: Secondary | ICD-10-CM | POA: Diagnosis not present

## 2019-12-15 ENCOUNTER — Other Ambulatory Visit: Payer: Self-pay | Admitting: *Deleted

## 2019-12-15 DIAGNOSIS — I69351 Hemiplegia and hemiparesis following cerebral infarction affecting right dominant side: Secondary | ICD-10-CM | POA: Diagnosis not present

## 2019-12-15 DIAGNOSIS — I5032 Chronic diastolic (congestive) heart failure: Secondary | ICD-10-CM | POA: Diagnosis not present

## 2019-12-15 DIAGNOSIS — R1310 Dysphagia, unspecified: Secondary | ICD-10-CM | POA: Diagnosis not present

## 2019-12-15 DIAGNOSIS — I10 Essential (primary) hypertension: Secondary | ICD-10-CM

## 2019-12-15 DIAGNOSIS — I130 Hypertensive heart and chronic kidney disease with heart failure and stage 1 through stage 4 chronic kidney disease, or unspecified chronic kidney disease: Secondary | ICD-10-CM | POA: Diagnosis not present

## 2019-12-15 DIAGNOSIS — K224 Dyskinesia of esophagus: Secondary | ICD-10-CM | POA: Diagnosis not present

## 2019-12-15 DIAGNOSIS — M800AXD Age-related osteoporosis with current pathological fracture, other site, subsequent encounter for fracture with routine healing: Secondary | ICD-10-CM | POA: Diagnosis not present

## 2019-12-15 NOTE — Patient Outreach (Signed)
[facility] Post Acute Care Coordinator follow up  Verified in Patient [name] [name] that [name] [name] transitioned to home on 12/13/19. Verified with [place] that member has [place] arranged.   Previously spoke with daughter [name] [name] (daughter) [phone] and husband [name] [name] about [place] [facility] services. Please see writer's notes from 12/06/19 for additional details.   Will make referrals for [place] for care coordination and [facility] LCSW for mobile meals program.    Marthenia Rolling, MSN-Ed, RN,BSN [place] Acute Care Coordinator [phone] [facility]) [phone]  (Toll free office)

## 2019-12-17 ENCOUNTER — Other Ambulatory Visit: Payer: Self-pay | Admitting: *Deleted

## 2019-12-17 NOTE — Patient Outreach (Signed)
[facility]) Care Management  12/17/2019  [name] [name] 03/24/1930 [id number]   Telephone outreach for transition of care call SNF to home.  Talked with pt's daughter Izola Price, who is pt's primary caregiver. She reports her mother came home on Monday and everthing is going smoothly. She has started her home health PT. She has all her meds.   No immediate questions for resolution per daughter.  I will call again next Monday.  [name] [name], MSN, [facility] Gerontological Nurse Practitioner [facility] Care Management [phone]

## 2019-12-20 ENCOUNTER — Other Ambulatory Visit: Payer: Self-pay | Admitting: *Deleted

## 2019-12-20 DIAGNOSIS — I5032 Chronic diastolic (congestive) heart failure: Secondary | ICD-10-CM | POA: Diagnosis not present

## 2019-12-20 DIAGNOSIS — K224 Dyskinesia of esophagus: Secondary | ICD-10-CM | POA: Diagnosis not present

## 2019-12-20 DIAGNOSIS — R001 Bradycardia, unspecified: Secondary | ICD-10-CM | POA: Insufficient documentation

## 2019-12-20 DIAGNOSIS — H9193 Unspecified hearing loss, bilateral: Secondary | ICD-10-CM | POA: Insufficient documentation

## 2019-12-20 DIAGNOSIS — R1310 Dysphagia, unspecified: Secondary | ICD-10-CM | POA: Diagnosis not present

## 2019-12-20 DIAGNOSIS — I69351 Hemiplegia and hemiparesis following cerebral infarction affecting right dominant side: Secondary | ICD-10-CM | POA: Diagnosis not present

## 2019-12-20 DIAGNOSIS — I130 Hypertensive heart and chronic kidney disease with heart failure and stage 1 through stage 4 chronic kidney disease, or unspecified chronic kidney disease: Secondary | ICD-10-CM | POA: Diagnosis not present

## 2019-12-20 DIAGNOSIS — R5383 Other fatigue: Secondary | ICD-10-CM | POA: Insufficient documentation

## 2019-12-20 DIAGNOSIS — M800AXD Age-related osteoporosis with current pathological fracture, other site, subsequent encounter for fracture with routine healing: Secondary | ICD-10-CM | POA: Diagnosis not present

## 2019-12-20 DIAGNOSIS — I350 Nonrheumatic aortic (valve) stenosis: Secondary | ICD-10-CM | POA: Insufficient documentation

## 2019-12-20 NOTE — Patient Outreach (Signed)
[place]) Care Management  12/20/2019  NOVIA KANIA 09-Jan-1930 [id number]   Transition of care call. Spoke to pt's daughter, Shauna Hugh briefly. She was not able to talk long as she was on her way to an appt.  She does report her mother is doing very well in therapy and she care caregivers and good family support. She is pleased with her mother's progression.  Decided we would talk later this week to complete her initial assessment.  [name] [name], MSN, [facility] Gerontological Nurse Practitioner [facility] Care Management [phone]

## 2019-12-21 DIAGNOSIS — I5032 Chronic diastolic (congestive) heart failure: Secondary | ICD-10-CM | POA: Diagnosis not present

## 2019-12-21 DIAGNOSIS — I69351 Hemiplegia and hemiparesis following cerebral infarction affecting right dominant side: Secondary | ICD-10-CM | POA: Diagnosis not present

## 2019-12-21 DIAGNOSIS — M800AXD Age-related osteoporosis with current pathological fracture, other site, subsequent encounter for fracture with routine healing: Secondary | ICD-10-CM | POA: Diagnosis not present

## 2019-12-21 DIAGNOSIS — I130 Hypertensive heart and chronic kidney disease with heart failure and stage 1 through stage 4 chronic kidney disease, or unspecified chronic kidney disease: Secondary | ICD-10-CM | POA: Diagnosis not present

## 2019-12-21 DIAGNOSIS — R1310 Dysphagia, unspecified: Secondary | ICD-10-CM | POA: Diagnosis not present

## 2019-12-21 DIAGNOSIS — K224 Dyskinesia of esophagus: Secondary | ICD-10-CM | POA: Diagnosis not present

## 2019-12-22 ENCOUNTER — Other Ambulatory Visit: Payer: Self-pay | Admitting: *Deleted

## 2019-12-22 DIAGNOSIS — I130 Hypertensive heart and chronic kidney disease with heart failure and stage 1 through stage 4 chronic kidney disease, or unspecified chronic kidney disease: Secondary | ICD-10-CM | POA: Diagnosis not present

## 2019-12-22 DIAGNOSIS — K224 Dyskinesia of esophagus: Secondary | ICD-10-CM | POA: Diagnosis not present

## 2019-12-22 DIAGNOSIS — I69351 Hemiplegia and hemiparesis following cerebral infarction affecting right dominant side: Secondary | ICD-10-CM | POA: Diagnosis not present

## 2019-12-22 DIAGNOSIS — R1310 Dysphagia, unspecified: Secondary | ICD-10-CM | POA: Diagnosis not present

## 2019-12-22 DIAGNOSIS — I5032 Chronic diastolic (congestive) heart failure: Secondary | ICD-10-CM | POA: Diagnosis not present

## 2019-12-22 DIAGNOSIS — M800AXD Age-related osteoporosis with current pathological fracture, other site, subsequent encounter for fracture with routine healing: Secondary | ICD-10-CM | POA: Diagnosis not present

## 2019-12-22 NOTE — Patient Outreach (Signed)
[name]  12/22/2019  [name] [name] 30-Oct-1929 [id number]  Called pt's daughter Shauna Hugh to advise I am running late on my schedule today and to ask to reschedule our call. She agreed with me and we will talk Friday, May 14th.  [name] [name], MSN, [facility] Gerontological Nurse Practitioner [facility] Care Management [phone]

## 2019-12-28 DIAGNOSIS — I5032 Chronic diastolic (congestive) heart failure: Secondary | ICD-10-CM | POA: Diagnosis not present

## 2019-12-28 DIAGNOSIS — I69351 Hemiplegia and hemiparesis following cerebral infarction affecting right dominant side: Secondary | ICD-10-CM | POA: Diagnosis not present

## 2019-12-28 DIAGNOSIS — K224 Dyskinesia of esophagus: Secondary | ICD-10-CM | POA: Diagnosis not present

## 2019-12-28 DIAGNOSIS — I130 Hypertensive heart and chronic kidney disease with heart failure and stage 1 through stage 4 chronic kidney disease, or unspecified chronic kidney disease: Secondary | ICD-10-CM | POA: Diagnosis not present

## 2019-12-28 DIAGNOSIS — R1310 Dysphagia, unspecified: Secondary | ICD-10-CM | POA: Diagnosis not present

## 2019-12-28 DIAGNOSIS — M800AXD Age-related osteoporosis with current pathological fracture, other site, subsequent encounter for fracture with routine healing: Secondary | ICD-10-CM | POA: Diagnosis not present

## 2019-12-30 DIAGNOSIS — I5032 Chronic diastolic (congestive) heart failure: Secondary | ICD-10-CM | POA: Diagnosis not present

## 2019-12-30 DIAGNOSIS — K224 Dyskinesia of esophagus: Secondary | ICD-10-CM | POA: Diagnosis not present

## 2019-12-30 DIAGNOSIS — R1310 Dysphagia, unspecified: Secondary | ICD-10-CM | POA: Diagnosis not present

## 2019-12-30 DIAGNOSIS — I130 Hypertensive heart and chronic kidney disease with heart failure and stage 1 through stage 4 chronic kidney disease, or unspecified chronic kidney disease: Secondary | ICD-10-CM | POA: Diagnosis not present

## 2019-12-30 DIAGNOSIS — M800AXD Age-related osteoporosis with current pathological fracture, other site, subsequent encounter for fracture with routine healing: Secondary | ICD-10-CM | POA: Diagnosis not present

## 2019-12-30 DIAGNOSIS — I69351 Hemiplegia and hemiparesis following cerebral infarction affecting right dominant side: Secondary | ICD-10-CM | POA: Diagnosis not present

## 2019-12-31 ENCOUNTER — Other Ambulatory Visit: Payer: Self-pay | Admitting: *Deleted

## 2020-01-03 ENCOUNTER — Other Ambulatory Visit: Payer: Self-pay | Admitting: *Deleted

## 2020-01-03 DIAGNOSIS — Z90710 Acquired absence of both cervix and uterus: Secondary | ICD-10-CM | POA: Diagnosis not present

## 2020-01-03 DIAGNOSIS — I509 Heart failure, unspecified: Secondary | ICD-10-CM | POA: Diagnosis not present

## 2020-01-03 DIAGNOSIS — N3001 Acute cystitis with hematuria: Secondary | ICD-10-CM | POA: Diagnosis not present

## 2020-01-03 DIAGNOSIS — K649 Unspecified hemorrhoids: Secondary | ICD-10-CM | POA: Diagnosis not present

## 2020-01-03 DIAGNOSIS — I110 Hypertensive heart disease with heart failure: Secondary | ICD-10-CM | POA: Diagnosis not present

## 2020-01-03 DIAGNOSIS — D649 Anemia, unspecified: Secondary | ICD-10-CM | POA: Diagnosis not present

## 2020-01-03 DIAGNOSIS — I4891 Unspecified atrial fibrillation: Secondary | ICD-10-CM | POA: Diagnosis not present

## 2020-01-03 DIAGNOSIS — R1084 Generalized abdominal pain: Secondary | ICD-10-CM | POA: Diagnosis not present

## 2020-01-03 DIAGNOSIS — Z7901 Long term (current) use of anticoagulants: Secondary | ICD-10-CM | POA: Diagnosis not present

## 2020-01-03 DIAGNOSIS — R42 Dizziness and giddiness: Secondary | ICD-10-CM | POA: Diagnosis not present

## 2020-01-03 DIAGNOSIS — K5790 Diverticulosis of intestine, part unspecified, without perforation or abscess without bleeding: Secondary | ICD-10-CM | POA: Diagnosis not present

## 2020-01-03 DIAGNOSIS — R1030 Lower abdominal pain, unspecified: Secondary | ICD-10-CM | POA: Diagnosis not present

## 2020-01-03 DIAGNOSIS — R319 Hematuria, unspecified: Secondary | ICD-10-CM | POA: Diagnosis not present

## 2020-01-03 DIAGNOSIS — S3210XD Unspecified fracture of sacrum, subsequent encounter for fracture with routine healing: Secondary | ICD-10-CM | POA: Diagnosis not present

## 2020-01-03 DIAGNOSIS — Z89429 Acquired absence of other toe(s), unspecified side: Secondary | ICD-10-CM | POA: Diagnosis not present

## 2020-01-03 DIAGNOSIS — S32502D Unspecified fracture of left pubis, subsequent encounter for fracture with routine healing: Secondary | ICD-10-CM | POA: Diagnosis not present

## 2020-01-03 DIAGNOSIS — H9190 Unspecified hearing loss, unspecified ear: Secondary | ICD-10-CM | POA: Diagnosis not present

## 2020-01-03 DIAGNOSIS — Z8739 Personal history of other diseases of the musculoskeletal system and connective tissue: Secondary | ICD-10-CM | POA: Diagnosis not present

## 2020-01-03 DIAGNOSIS — Z8673 Personal history of transient ischemic attack (TIA), and cerebral infarction without residual deficits: Secondary | ICD-10-CM | POA: Diagnosis not present

## 2020-01-03 DIAGNOSIS — R748 Abnormal levels of other serum enzymes: Secondary | ICD-10-CM | POA: Diagnosis not present

## 2020-01-05 DIAGNOSIS — K224 Dyskinesia of esophagus: Secondary | ICD-10-CM | POA: Diagnosis not present

## 2020-01-05 DIAGNOSIS — M800AXD Age-related osteoporosis with current pathological fracture, other site, subsequent encounter for fracture with routine healing: Secondary | ICD-10-CM | POA: Diagnosis not present

## 2020-01-05 DIAGNOSIS — I130 Hypertensive heart and chronic kidney disease with heart failure and stage 1 through stage 4 chronic kidney disease, or unspecified chronic kidney disease: Secondary | ICD-10-CM | POA: Diagnosis not present

## 2020-01-05 DIAGNOSIS — R1310 Dysphagia, unspecified: Secondary | ICD-10-CM | POA: Diagnosis not present

## 2020-01-05 DIAGNOSIS — I5032 Chronic diastolic (congestive) heart failure: Secondary | ICD-10-CM | POA: Diagnosis not present

## 2020-01-05 DIAGNOSIS — I69351 Hemiplegia and hemiparesis following cerebral infarction affecting right dominant side: Secondary | ICD-10-CM | POA: Diagnosis not present

## 2020-01-06 ENCOUNTER — Other Ambulatory Visit: Payer: Self-pay | Admitting: *Deleted

## 2020-01-06 DIAGNOSIS — K224 Dyskinesia of esophagus: Secondary | ICD-10-CM | POA: Diagnosis not present

## 2020-01-06 DIAGNOSIS — I130 Hypertensive heart and chronic kidney disease with heart failure and stage 1 through stage 4 chronic kidney disease, or unspecified chronic kidney disease: Secondary | ICD-10-CM | POA: Diagnosis not present

## 2020-01-06 DIAGNOSIS — I69351 Hemiplegia and hemiparesis following cerebral infarction affecting right dominant side: Secondary | ICD-10-CM | POA: Diagnosis not present

## 2020-01-06 DIAGNOSIS — M800AXD Age-related osteoporosis with current pathological fracture, other site, subsequent encounter for fracture with routine healing: Secondary | ICD-10-CM | POA: Diagnosis not present

## 2020-01-06 DIAGNOSIS — I5032 Chronic diastolic (congestive) heart failure: Secondary | ICD-10-CM | POA: Diagnosis not present

## 2020-01-06 DIAGNOSIS — R1310 Dysphagia, unspecified: Secondary | ICD-10-CM | POA: Diagnosis not present

## 2020-01-06 NOTE — Patient Outreach (Signed)
[place] [facility]) Care Management  01/06/2020  [name] [name] 05-Apr-1930 [id number]  Transition of care call. Spoke with pt's daughter, [name] [name], who reports pt is doing remarkably well. She did have to go to the ED as she has significant hematuria, on apixeban and did have a UTI.  She is still getting PT and is progressing nicely.  No other new problems.  Encouraged continued exercise on days she does not have PT.  I will call again in one week.  [name] [name], MSN, [facility] Gerontological Nurse Practitioner [facility] Care Management [phone]

## 2020-01-10 ENCOUNTER — Ambulatory Visit: Payer: Self-pay | Admitting: *Deleted

## 2020-01-11 DIAGNOSIS — I69351 Hemiplegia and hemiparesis following cerebral infarction affecting right dominant side: Secondary | ICD-10-CM | POA: Diagnosis not present

## 2020-01-11 DIAGNOSIS — I130 Hypertensive heart and chronic kidney disease with heart failure and stage 1 through stage 4 chronic kidney disease, or unspecified chronic kidney disease: Secondary | ICD-10-CM | POA: Diagnosis not present

## 2020-01-11 DIAGNOSIS — R1310 Dysphagia, unspecified: Secondary | ICD-10-CM | POA: Diagnosis not present

## 2020-01-11 DIAGNOSIS — I5032 Chronic diastolic (congestive) heart failure: Secondary | ICD-10-CM | POA: Diagnosis not present

## 2020-01-11 DIAGNOSIS — K224 Dyskinesia of esophagus: Secondary | ICD-10-CM | POA: Diagnosis not present

## 2020-01-11 DIAGNOSIS — M800AXD Age-related osteoporosis with current pathological fracture, other site, subsequent encounter for fracture with routine healing: Secondary | ICD-10-CM | POA: Diagnosis not present

## 2020-01-12 DIAGNOSIS — Z6820 Body mass index (BMI) 20.0-20.9, adult: Secondary | ICD-10-CM | POA: Diagnosis not present

## 2020-01-12 DIAGNOSIS — S329XXA Fracture of unspecified parts of lumbosacral spine and pelvis, initial encounter for closed fracture: Secondary | ICD-10-CM | POA: Diagnosis not present

## 2020-01-12 DIAGNOSIS — E559 Vitamin D deficiency, unspecified: Secondary | ICD-10-CM | POA: Diagnosis not present

## 2020-01-12 DIAGNOSIS — M810 Age-related osteoporosis without current pathological fracture: Secondary | ICD-10-CM | POA: Diagnosis not present

## 2020-01-13 ENCOUNTER — Other Ambulatory Visit: Payer: Self-pay | Admitting: *Deleted

## 2020-01-13 DIAGNOSIS — F332 Major depressive disorder, recurrent severe without psychotic features: Secondary | ICD-10-CM | POA: Diagnosis not present

## 2020-01-13 DIAGNOSIS — Z7901 Long term (current) use of anticoagulants: Secondary | ICD-10-CM | POA: Diagnosis not present

## 2020-01-13 DIAGNOSIS — K5790 Diverticulosis of intestine, part unspecified, without perforation or abscess without bleeding: Secondary | ICD-10-CM | POA: Diagnosis not present

## 2020-01-13 DIAGNOSIS — I69398 Other sequelae of cerebral infarction: Secondary | ICD-10-CM | POA: Diagnosis not present

## 2020-01-13 DIAGNOSIS — Z7982 Long term (current) use of aspirin: Secondary | ICD-10-CM | POA: Diagnosis not present

## 2020-01-13 DIAGNOSIS — I69351 Hemiplegia and hemiparesis following cerebral infarction affecting right dominant side: Secondary | ICD-10-CM | POA: Diagnosis not present

## 2020-01-13 DIAGNOSIS — I130 Hypertensive heart and chronic kidney disease with heart failure and stage 1 through stage 4 chronic kidney disease, or unspecified chronic kidney disease: Secondary | ICD-10-CM | POA: Diagnosis not present

## 2020-01-13 DIAGNOSIS — K5792 Diverticulitis of intestine, part unspecified, without perforation or abscess without bleeding: Secondary | ICD-10-CM | POA: Diagnosis not present

## 2020-01-13 DIAGNOSIS — K224 Dyskinesia of esophagus: Secondary | ICD-10-CM | POA: Diagnosis not present

## 2020-01-13 DIAGNOSIS — I4891 Unspecified atrial fibrillation: Secondary | ICD-10-CM | POA: Diagnosis not present

## 2020-01-13 DIAGNOSIS — D62 Acute posthemorrhagic anemia: Secondary | ICD-10-CM | POA: Diagnosis not present

## 2020-01-13 DIAGNOSIS — M800AXD Age-related osteoporosis with current pathological fracture, other site, subsequent encounter for fracture with routine healing: Secondary | ICD-10-CM | POA: Diagnosis not present

## 2020-01-13 DIAGNOSIS — N183 Chronic kidney disease, stage 3 unspecified: Secondary | ICD-10-CM | POA: Diagnosis not present

## 2020-01-13 DIAGNOSIS — R1310 Dysphagia, unspecified: Secondary | ICD-10-CM | POA: Diagnosis not present

## 2020-01-13 DIAGNOSIS — W010XXD Fall on same level from slipping, tripping and stumbling without subsequent striking against object, subsequent encounter: Secondary | ICD-10-CM | POA: Diagnosis not present

## 2020-01-13 DIAGNOSIS — R4182 Altered mental status, unspecified: Secondary | ICD-10-CM | POA: Diagnosis not present

## 2020-01-13 DIAGNOSIS — I5032 Chronic diastolic (congestive) heart failure: Secondary | ICD-10-CM | POA: Diagnosis not present

## 2020-01-13 DIAGNOSIS — M1712 Unilateral primary osteoarthritis, left knee: Secondary | ICD-10-CM | POA: Diagnosis not present

## 2020-01-13 DIAGNOSIS — K589 Irritable bowel syndrome without diarrhea: Secondary | ICD-10-CM | POA: Diagnosis not present

## 2020-01-13 DIAGNOSIS — J449 Chronic obstructive pulmonary disease, unspecified: Secondary | ICD-10-CM | POA: Diagnosis not present

## 2020-01-13 DIAGNOSIS — H02409 Unspecified ptosis of unspecified eyelid: Secondary | ICD-10-CM | POA: Diagnosis not present

## 2020-01-14 NOTE — Patient Outreach (Signed)
[facility]) Care Management  01/14/2020  MACKINZEE ADERHOLT 01-22-1930 [id number]   Telephone outreach:  Mobility status:  UTI Resoved?    [name] [name], MSN, [facility] Gerontological Nurse Practitioner [facility] Care Management [phone]

## 2020-01-18 DIAGNOSIS — Z7901 Long term (current) use of anticoagulants: Secondary | ICD-10-CM | POA: Diagnosis not present

## 2020-01-18 DIAGNOSIS — M4854XD Collapsed vertebra, not elsewhere classified, thoracic region, subsequent encounter for fracture with routine healing: Secondary | ICD-10-CM | POA: Diagnosis not present

## 2020-01-18 DIAGNOSIS — S299XXA Unspecified injury of thorax, initial encounter: Secondary | ICD-10-CM | POA: Diagnosis not present

## 2020-01-18 DIAGNOSIS — W1811XA Fall from or off toilet without subsequent striking against object, initial encounter: Secondary | ICD-10-CM | POA: Diagnosis not present

## 2020-01-18 DIAGNOSIS — Z20822 Contact with and (suspected) exposure to covid-19: Secondary | ICD-10-CM | POA: Diagnosis not present

## 2020-01-18 DIAGNOSIS — I517 Cardiomegaly: Secondary | ICD-10-CM | POA: Diagnosis not present

## 2020-01-18 DIAGNOSIS — Z89429 Acquired absence of other toe(s), unspecified side: Secondary | ICD-10-CM | POA: Diagnosis not present

## 2020-01-18 DIAGNOSIS — S3992XA Unspecified injury of lower back, initial encounter: Secondary | ICD-10-CM | POA: Diagnosis not present

## 2020-01-18 DIAGNOSIS — R911 Solitary pulmonary nodule: Secondary | ICD-10-CM | POA: Diagnosis not present

## 2020-01-18 DIAGNOSIS — S32602D Unspecified fracture of left ischium, subsequent encounter for fracture with routine healing: Secondary | ICD-10-CM | POA: Diagnosis not present

## 2020-01-18 DIAGNOSIS — S329XXD Fracture of unspecified parts of lumbosacral spine and pelvis, subsequent encounter for fracture with routine healing: Secondary | ICD-10-CM | POA: Diagnosis not present

## 2020-01-18 DIAGNOSIS — R519 Headache, unspecified: Secondary | ICD-10-CM | POA: Diagnosis not present

## 2020-01-18 DIAGNOSIS — S0993XA Unspecified injury of face, initial encounter: Secondary | ICD-10-CM | POA: Diagnosis not present

## 2020-01-18 DIAGNOSIS — R52 Pain, unspecified: Secondary | ICD-10-CM | POA: Diagnosis not present

## 2020-01-18 DIAGNOSIS — I4891 Unspecified atrial fibrillation: Secondary | ICD-10-CM | POA: Diagnosis not present

## 2020-01-18 DIAGNOSIS — M25512 Pain in left shoulder: Secondary | ICD-10-CM | POA: Diagnosis not present

## 2020-01-18 DIAGNOSIS — M47816 Spondylosis without myelopathy or radiculopathy, lumbar region: Secondary | ICD-10-CM | POA: Diagnosis not present

## 2020-01-18 DIAGNOSIS — J90 Pleural effusion, not elsewhere classified: Secondary | ICD-10-CM | POA: Diagnosis not present

## 2020-01-18 DIAGNOSIS — I10 Essential (primary) hypertension: Secondary | ICD-10-CM | POA: Diagnosis not present

## 2020-01-18 DIAGNOSIS — I700 Atherosclerosis of aorta: Secondary | ICD-10-CM | POA: Diagnosis not present

## 2020-01-19 ENCOUNTER — Other Ambulatory Visit: Payer: Self-pay | Admitting: *Deleted

## 2020-01-19 DIAGNOSIS — Z6822 Body mass index (BMI) 22.0-22.9, adult: Secondary | ICD-10-CM | POA: Diagnosis not present

## 2020-01-19 DIAGNOSIS — E782 Mixed hyperlipidemia: Secondary | ICD-10-CM | POA: Diagnosis not present

## 2020-01-19 DIAGNOSIS — I10 Essential (primary) hypertension: Secondary | ICD-10-CM | POA: Diagnosis not present

## 2020-01-19 DIAGNOSIS — G8194 Hemiplegia, unspecified affecting left nondominant side: Secondary | ICD-10-CM | POA: Diagnosis not present

## 2020-01-19 DIAGNOSIS — E8881 Metabolic syndrome: Secondary | ICD-10-CM | POA: Diagnosis not present

## 2020-01-19 DIAGNOSIS — I4891 Unspecified atrial fibrillation: Secondary | ICD-10-CM | POA: Diagnosis not present

## 2020-01-19 DIAGNOSIS — I5032 Chronic diastolic (congestive) heart failure: Secondary | ICD-10-CM | POA: Diagnosis not present

## 2020-01-19 MED ORDER — LIDOCAINE 5 % EX PTCH
1.00 | MEDICATED_PATCH | CUTANEOUS | Status: DC
Start: 2020-01-19 — End: 2020-01-19

## 2020-01-31 ENCOUNTER — Encounter: Payer: Self-pay | Admitting: *Deleted

## 2020-01-31 ENCOUNTER — Other Ambulatory Visit: Payer: Self-pay | Admitting: *Deleted

## 2020-01-31 NOTE — Patient Outreach (Signed)
[facility]) Care Management  01/31/2020  [name] [name] 11-Sep-1929 [id number]  Telephone outreach.  Pt's daughter, [name] [name], reports her mother is doing very well. She has completed her therapy. She has not had any falls. She continues to do recommended exercises.  She has completed her care plan goals and has no futher needs at this time according to [place].  Advised will close her case but am available at any time in the future for questions or new problems.  [facility] CM Care Plan Problem One     Most Recent Value  Care Plan Problem One Trauma  Role Documenting the Problem One Care Management [place] for Problem One Active  [facility] Long Term Goal  Pt will participate in therapy and acheive set goals over the next 31 days.  [facility] Long Term Goal Start Date 12/20/19  [facility] Long Term Goal Met Date 01/31/20  [facility] CM Short Term Goal #1  Pt will not sustain a fall or other household injury requiring hospitalization during the next 30 days.  [facility] CM Short Term Goal #1 Start Date 12/20/19  [facility] CM Short Term Goal #1 Met Date 01/31/20  [facility] CM Short Term Goal #2  Pt/daughter will reach out to MD, Home Health or NP if any health problems arise over the next 30 days.  [facility] CM Short Term Goal #2 Start Date 12/20/19  [facility] CM Short Term Goal #2 Met Date 01/31/20     [name] Pont. [name], MSN, [facility] Gerontological Nurse Practitioner [facility] Care Management [phone]

## 2020-02-22 ENCOUNTER — Other Ambulatory Visit: Payer: Medicare Other

## 2020-02-22 ENCOUNTER — Other Ambulatory Visit: Payer: Self-pay

## 2020-02-22 DIAGNOSIS — Z515 Encounter for palliative care: Secondary | ICD-10-CM

## 2020-02-24 NOTE — Progress Notes (Signed)
COMMUNITY PALLIATIVE CARE SW NOTE  PATIENT NAME: [name] [name] DOB: 1929-09-04 MRN: [id number]  PRIMARY CARE PROVIDER: Caryl Bis, MD  RESPONSIBLE PARTY:  Acct ID - Guarantor Home Phone Work Phone Relationship Acct Type  [id number] August Albino [phone]  Self P/F     [address], [place], [place] [address]     PLAN OF CARE and INTERVENTIONS:             1. GOALS OF CARE/ ADVANCE CARE PLANNING:  Goal is for patient to remain in her home, as comfortable, safe and independent as possible. Patient is a DNR.  2. SOCIAL/EMOTIONAL/SPIRITUAL ASSESSMENT/ INTERVENTIONS:  SW completed initial visit with patient at her home. She was present with her husband-Bill and son-David. Patient was sitting in her recliner. SW provided education regarding the palliative care program, role in her care, visit frequency and access to support. Patient provided verbal consent to ongoing palliative care services. Patient provided information on her medical/physical status and social history. Patient ambulates with a walker or wheelchair but legs are weak and give out easily. She shared that she has had several falls in the past. She received physical therapy that concluded a weak ago. Patient is on continues 02 at 3L. Her appetite is good as usually eat 75-100% of three meals. Her weight is 128 lbs as of 02/22/20-patient weighs herself. No anxiety issues, but does get upset over news events. Patient is fully vaccinated Levan [facility]).        Patient is from [place], [place]. She has two years of college. 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 She has been married for 67 years to her husband-Bill. She has four children: Charolett Bumpers. 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 Patient has a living will, DNR, POA/HCPOA. SW provided supportive presence, active and reflective listening, education regarding palliative care program, assessment of needs and comfort, reassurance of support.   3. PATIENT/CAREGIVER  EDUCATION/ COPING:  Patient is alert and oriented x3. She seems to be coping well. She has a supportive husband and family. Her church community is also supportive.  4. PERSONAL EMERGENCY PLAN:  911 can be activated for emergencies. Patient will also notify the palliative care team and her primary care physician of any concerns or changes in condition.  5. COMMUNITY RESOURCES COORDINATION/ HEALTH CARE NAVIGATION:  Patient has a housekeeper 3 x/week.  6. FINANCIAL/LEGAL CONCERNS/INTERVENTIONS:  No financial or legal issues.     SOCIAL HX:  Social History   Tobacco Use  . Smoking status: Never Smoker  . Smokeless tobacco: Never Used  Substance Use Topics  . Alcohol use: No    Alcohol/week: 0.0 standard drinks    CODE STATUS: DNR ADVANCED DIRECTIVES: Yes MOST FORM COMPLETE:  No HOSPICE EDUCATION PROVIDED: No  PPS: Patient is alert and oriented x3. She is on continuous 02. Patient ambulates with her walker or wheelchair.   Duration of visit and documentation: 60 minutes      Katheren Puller, LCSW

## 2020-03-24 DIAGNOSIS — K920 Hematemesis: Secondary | ICD-10-CM | POA: Diagnosis not present

## 2020-03-24 DIAGNOSIS — K921 Melena: Secondary | ICD-10-CM | POA: Diagnosis not present

## 2020-03-29 ENCOUNTER — Encounter (HOSPITAL_COMMUNITY): Payer: Self-pay | Admitting: *Deleted

## 2020-03-29 ENCOUNTER — Other Ambulatory Visit: Payer: Self-pay

## 2020-03-29 DIAGNOSIS — Z7901 Long term (current) use of anticoagulants: Secondary | ICD-10-CM | POA: Insufficient documentation

## 2020-03-29 DIAGNOSIS — J90 Pleural effusion, not elsewhere classified: Secondary | ICD-10-CM | POA: Diagnosis not present

## 2020-03-29 DIAGNOSIS — Z79899 Other long term (current) drug therapy: Secondary | ICD-10-CM | POA: Diagnosis not present

## 2020-03-29 DIAGNOSIS — Z8673 Personal history of transient ischemic attack (TIA), and cerebral infarction without residual deficits: Secondary | ICD-10-CM | POA: Diagnosis not present

## 2020-03-29 DIAGNOSIS — R109 Unspecified abdominal pain: Secondary | ICD-10-CM | POA: Diagnosis present

## 2020-03-29 DIAGNOSIS — I4891 Unspecified atrial fibrillation: Secondary | ICD-10-CM | POA: Insufficient documentation

## 2020-03-29 DIAGNOSIS — N182 Chronic kidney disease, stage 2 (mild): Secondary | ICD-10-CM | POA: Insufficient documentation

## 2020-03-29 DIAGNOSIS — R040 Epistaxis: Secondary | ICD-10-CM | POA: Diagnosis not present

## 2020-03-29 DIAGNOSIS — I517 Cardiomegaly: Secondary | ICD-10-CM | POA: Diagnosis not present

## 2020-03-29 DIAGNOSIS — I5031 Acute diastolic (congestive) heart failure: Secondary | ICD-10-CM | POA: Diagnosis not present

## 2020-03-29 DIAGNOSIS — Z7982 Long term (current) use of aspirin: Secondary | ICD-10-CM | POA: Insufficient documentation

## 2020-03-29 DIAGNOSIS — I2510 Atherosclerotic heart disease of native coronary artery without angina pectoris: Secondary | ICD-10-CM | POA: Diagnosis not present

## 2020-03-29 DIAGNOSIS — I130 Hypertensive heart and chronic kidney disease with heart failure and stage 1 through stage 4 chronic kidney disease, or unspecified chronic kidney disease: Secondary | ICD-10-CM | POA: Insufficient documentation

## 2020-03-29 DIAGNOSIS — R042 Hemoptysis: Secondary | ICD-10-CM | POA: Diagnosis not present

## 2020-03-29 NOTE — ED Triage Notes (Signed)
Pt states she has been coughing up blood and diarrhea that causes her hemorrhoids to start bleeding

## 2020-03-30 ENCOUNTER — Emergency Department (HOSPITAL_COMMUNITY)
Admission: EM | Admit: 2020-03-30 | Discharge: 2020-03-30 | Disposition: A | Discharge status: Home / Self Care | Payer: Medicare Other | Attending: Emergency Medicine | Admitting: Emergency Medicine

## 2020-03-30 ENCOUNTER — Emergency Department (HOSPITAL_COMMUNITY): Payer: Medicare Other

## 2020-03-30 DIAGNOSIS — R042 Hemoptysis: Secondary | ICD-10-CM

## 2020-03-30 DIAGNOSIS — I517 Cardiomegaly: Secondary | ICD-10-CM | POA: Diagnosis not present

## 2020-03-30 DIAGNOSIS — J90 Pleural effusion, not elsewhere classified: Secondary | ICD-10-CM | POA: Diagnosis not present

## 2020-03-30 DIAGNOSIS — R040 Epistaxis: Secondary | ICD-10-CM

## 2020-03-30 DIAGNOSIS — Z7901 Long term (current) use of anticoagulants: Secondary | ICD-10-CM

## 2020-03-30 LAB — COMPREHENSIVE METABOLIC PANEL
ALT: 27 U/L (ref 0–44)
AST: 34 U/L (ref 15–41)
Albumin: 4.4 g/dL (ref 3.5–5.0)
Alkaline Phosphatase: 65 U/L (ref 38–126)
Anion gap: 13 (ref 5–15)
BUN: 14 mg/dL (ref 8–23)
CO2: 24 mmol/L (ref 22–32)
Calcium: 9.3 mg/dL (ref 8.9–10.3)
Chloride: 103 mmol/L (ref 98–111)
Creatinine, Ser: 0.74 mg/dL (ref 0.44–1.00)
GFR calc Af Amer: >60 mL/min (ref >60)
GFR calc non Af Amer: >60 mL/min (ref >60)
Glucose, Bld: 117 mg/dL — ABNORMAL HIGH (ref 70–99)
Potassium: 3.5 mmol/L (ref 3.5–5.1)
Sodium: 140 mmol/L (ref 135–145)
Total Bilirubin: 1.2 mg/dL (ref 0.3–1.2)
Total Protein: 7.9 g/dL (ref 6.5–8.1)

## 2020-03-30 LAB — TYPE AND SCREEN
ABO/RH(D): A POS
Antibody Screen: NEGATIVE

## 2020-03-30 LAB — CBC WITH DIFFERENTIAL/PLATELET
Abs Immature Granulocytes: 0.03 10*3/uL (ref 0.00–0.07)
Basophils Absolute: 0.1 10*3/uL (ref 0.0–0.1)
Basophils Relative: 1 %
Eosinophils Absolute: 0.2 10*3/uL (ref 0.0–0.5)
Eosinophils Relative: 2 %
HCT: 42.1 % (ref 36.0–46.0)
Hemoglobin: 13.5 g/dL (ref 12.0–15.0)
Immature Granulocytes: 0 %
Lymphocytes Relative: 21 %
Lymphs Abs: 2.3 10*3/uL (ref 0.7–4.0)
MCH: 30 pg (ref 26.0–34.0)
MCHC: 32.1 g/dL (ref 30.0–36.0)
MCV: 93.6 fL (ref 80.0–100.0)
Monocytes Absolute: 0.9 10*3/uL (ref 0.1–1.0)
Monocytes Relative: 9 %
Neutro Abs: 7.3 10*3/uL (ref 1.7–7.7)
Neutrophils Relative %: 67 %
Platelets: 303 10*3/uL (ref 150–400)
RBC: 4.5 MIL/uL (ref 3.87–5.11)
RDW: 14.3 % (ref 11.5–15.5)
WBC: 10.7 10*3/uL — ABNORMAL HIGH (ref 4.0–10.5)
nRBC: 0 % (ref 0.0–0.2)

## 2020-03-30 LAB — POC OCCULT BLOOD, ED: Fecal Occult Bld: NEGATIVE

## 2020-03-30 NOTE — ED Notes (Signed)
Pt unable to sign. Pt received d/c papers. Wheeled to lobby and called for ride.

## 2020-03-30 NOTE — Discharge Instructions (Signed)
Continue taking your blood thinner.  Follow up with the lung specialist to find out why you have been coughing up blood.  Return if you are having any problems.

## 2020-03-30 NOTE — ED Provider Notes (Signed)
[facility] EMERGENCY DEPARTMENT Provider Note   CSN: [id number] Arrival date & time: 03/29/20  1830   History Chief Complaint  Patient presents with   Abdominal Pain    [name] [name] is a 86 y.o. female.  The history is provided by the patient.  Abdominal Pain She has history of hypertension, hyperlipidemia, coronary artery disease, atrial fibrillation anticoagulated on apixaban and comes in because of black stools for the last month.  This has been associated with some mild lower abdominal pain.  Also, for the last several weeks, she has noted hemoptysis, mainly when she wakes up in the morning.  Yesterday, she had a nosebleed which stopped on its own.  She does admit to occasionally feeling lightheaded.  She denies fever or chills.  She states she did have a colonoscopy last year and does not recall the results.  Past Medical History:  Diagnosis Date   Atrial fibrillation ([place])    On Coumadin   Coronary atherosclerosis of native coronary artery    Nonobstructive at cardiac catheterization 2004   Depression    Dysphagia    Essential hypertension, benign    Hyperlipidemia    Polymyalgia rheumatica ([place])    Stroke [facility])     Patient Active Problem List   Diagnosis Date Noted   Bilateral hearing loss 12/20/2019   Calcific aortic stenosis 12/20/2019   Fatigue 12/20/2019   Symptomatic bradycardia 12/20/2019   Closed fracture of multiple ribs of right side with routine healing 11/08/2019   Closed fracture of multiple pubic rami, right, initial encounter ([place]) 11/08/2019   Trauma 11/07/2019   History of cardioembolic cerebrovascular accident (CVA) 10/11/2019   Nonrheumatic aortic valve stenosis 10/11/2019   Avulsion fracture of lateral condyle of left tibia 03/15/2019   Rectal bleeding 02/24/2019   Atrial fibrillation with rapid ventricular response ([place]) 03/21/2018   Depression 03/21/2018   Chest pain 01/21/2018   Hypokalemia    Hypoxia    SOB  (shortness of breath)    Acute bronchitis    Atypical chest pain 09/25/2015   CKD (chronic kidney disease) stage 2, GFR 60-89 ml/min 52/77/8242   Acute diastolic CHF (congestive heart failure) ([place]) 35/36/1443   Acute diastolic congestive heart failure ([place]) 12/29/2014   Dyspnea    Chronic diarrhea 07/04/2014   Coronary atherosclerosis of native coronary artery 03/28/2014   Long term (current) use of anticoagulants 11/26/2010   Hyperlipidemia 05/11/2007   Essential hypertension, benign 05/11/2007   Chronic atrial fibrillation ([place]) 05/11/2007   Polymyalgia rheumatica ([place]) 05/11/2007    Past Surgical History:  Procedure Laterality Date   APPENDECTOMY     COLONOSCOPY  2008   Dr.Fleshiman   Left ankle     TOE SURGERY     TOTAL ABDOMINAL HYSTERECTOMY       OB History    Gravida      Para      Term      Preterm      AB      Living  4     SAB      TAB      Ectopic      Multiple      Live Births              Family History  Problem Relation Age of Onset   Stroke Mother    Hypertension Sister    Hypertension Brother    Rheum arthritis Daughter    Healthy Son    Healthy Son  Healthy Son    Hypertension Sister    Heart disease Sister    Heart disease Brother    Hypertension Brother    Arthritis Brother    Colon cancer Brother    Hypertension Brother    Coronary artery disease Other     Social History   Tobacco Use   Smoking status: Never Smoker   Smokeless tobacco: Never Used  Scientific laboratory technician Use: Never used  Substance Use Topics   Alcohol use: No    Alcohol/week: 0.0 standard drinks   Drug use: No    Home Medications Prior to Admission medications   Medication Sig Start Date End Date Taking? Authorizing Provider  acetaminophen (TYLENOL) 500 MG tablet Take 500-1,000 mg by mouth daily as needed for moderate pain.     [provider]  amLODipine (NORVASC) 5 MG tablet Take by mouth.     [provider]  apixaban (ELIQUIS) 5 MG TABS tablet Take 5 mg by mouth 2 (two) times daily.    [provider]  aspirin EC 81 MG tablet Take 81 mg by mouth daily. OTC    [provider]  atorvastatin (LIPITOR) 40 MG tablet Take 40 mg by mouth at bedtime.     [provider]  Biotin 1000 MCG tablet Take 1,000 mcg by mouth daily.    [provider]  Calcium Carbonate-Vitamin D (CALTRATE 600+D) 600-400 MG-UNIT per tablet Take 1 tablet by mouth daily.      [provider]  cephALEXin (KEFLEX) 500 MG capsule Take 1 capsule (500 mg total) by mouth 4 (four) times daily. 05/14/19   Mesner, Corene Cornea, MD  dicyclomine (BENTYL) 20 MG tablet Take 1 tablet by mouth 2 (two) times a day. 01/18/19   [provider]  digoxin (LANOXIN) 0.125 MG tablet Take 1 mcg by mouth daily. Chronic A-fib    [provider]  diltiazem (CARDIZEM) 120 MG tablet Take 120 mg by mouth 2 (two) times daily.    [provider]  DULoxetine (CYMBALTA) 30 MG capsule Take 30 mg by mouth daily. 10/26/18   [provider]  escitalopram (LEXAPRO) 10 MG tablet Take 10 mg by mouth daily. 01/18/19   [provider]  famotidine (PEPCID) 20 MG tablet Take 1 tablet by mouth 2 (two) times daily. 01/18/19   [provider]  furosemide (LASIX) 40 MG tablet Take 40 mg by mouth daily.     [provider]  losartan (COZAAR) 50 MG tablet Take 50 mg by mouth every evening.     [provider]  metoprolol succinate (TOPROL-XL) 25 MG 24 hr tablet Take 3 tablets (75 mg total) by mouth at bedtime. 03/23/18 08/06/19  Johnson, Clanford L, MD  ondansetron (ZOFRAN) 4 MG tablet Take 4 mg by mouth every 8 (eight) hours as needed for nausea or vomiting.    [provider]  potassium chloride (KLOR-CON) 20 MEQ packet Take by mouth 2 (two) times daily.    [provider]  potassium chloride SA (K-DUR,KLOR-CON) 20 MEQ tablet TAKE ONE TABLET BY  MOUTH EVERY DAY FOR LOW POTASSIUM 10/01/17   [provider]  predniSONE (DELTASONE) 5 MG tablet Take 10 mg by mouth daily.     [provider]  warfarin (COUMADIN) 4 MG tablet Take 2-4 mg by mouth See admin instructions. Patient takes 4mg  on one evening, then takes 2mg  for two evenings, then then takes 4mg , etc. (4mg , 2mg , 2mg , 4mg , etc) [place] [facility]  [provider]    Allergies    Sulfa antibiotics and Sulfonamide derivatives  Review of Systems   Review of Systems  Gastrointestinal: Positive for abdominal pain.  All other systems reviewed and are negative.   Physical Exam Updated Vital Signs BP (!) 229/86    Pulse 61    Temp 98.3 F (36.8 C) (Oral)    Resp 16    Ht 5' 6.5" (1.689 m)    Wt 54.4 kg    SpO2 100%    BMI 19.08 kg/m   Physical Exam Vitals and nursing note reviewed.   86 year old female, resting comfortably and in no acute distress. Vital signs are significant for elevated blood pressure. Oxygen saturation is 100%, which is normal. Head is normocephalic and atraumatic. PERRLA, EOMI. Oropharynx is clear.  Examination of the nasal cavity shows no evidence of recent bleeding, no obvious bleeding site.  There is no conjunctival pallor. Neck is nontender and supple without adenopathy or JVD. Back is nontender and there is no CVA tenderness. Lungs are clear without rales, wheezes, or rhonchi. Chest is nontender. Heart has regular rate and rhythm without murmur. Abdomen is soft, flat, with mild suprapubic tenderness.  There is no rebound or guarding.  There are no masses or hepatosplenomegaly and peristalsis is normoactive. Rectal: External skin tags are present.  Normal sphincter tone.  No stool present in the rectal ampulla. Extremities have no cyanosis or edema, full range of motion is present. Skin is warm and dry without rash. Neurologic: Mental status is normal, cranial nerves are intact, there are no motor or sensory deficits.  ED  Results / Procedures / Treatments   Labs (all labs ordered are listed, but only abnormal results are displayed) Labs Reviewed  COMPREHENSIVE METABOLIC PANEL - Abnormal; Notable for the following components:      Result Value   Glucose, Bld 117 (*)    All other components within normal limits  CBC WITH DIFFERENTIAL/PLATELET - Abnormal; Notable for the following components:   WBC 10.7 (*)    All other components within normal limits  POC OCCULT BLOOD, ED  TYPE AND SCREEN  ABO/RH   Radiology DG Chest 2 View  Result Date: 03/30/2020 CLINICAL DATA:  Hemoptysis EXAM: CHEST - 2 VIEW COMPARISON:  01/18/2020, 11/06/2019, 03/15/2019, 03/21/2018 FINDINGS: Cardiomegaly with aortic atherosclerosis. Small bilateral pleural effusions. No pneumothorax or focal consolidation. Small electronic device over the left lower chest as before. IMPRESSION: Cardiomegaly with small bilateral pleural effusions. Electronically Signed   By: Donavan Foil M.D.   On: 03/30/2020 02:27    Procedures Procedures  Medications Ordered in ED Medications - No data to display  ED Course  I have reviewed the triage vital signs and the nursing notes.  Pertinent labs & imaging results that were available during my care of the patient were reviewed by me and considered in my medical decision making (see chart for details).  MDM Rules/Calculators/A&P Melena in patient who is anticoagulated.  No stool was present for Hemoccult testing, mucus did test Hemoccult negative.  Hemoptysis of uncertain cause.  Epistaxis which has resolved.  Will check chest x-ray and screening labs.  Old records are reviewed, and she has had diagnosis of posthemorrhagic anemia.  Labs are unremarkable.  Hemoglobin is 13.5.  No evidence of significant bleeding.  Chest x-ray showed small bilateral pleural effusions but otherwise unremarkable.  No evidence of significant blood loss.  She is felt to be safe for discharge.  She is referred  to ENT and  pulmonology for further outpatient evaluation.  In the meantime, she is to continue taking her anticoagulant.  Final Clinical Impression(s) / ED Diagnoses Final diagnoses:  Epistaxis  Hemoptysis  Chronic anticoagulation    Rx / DC Orders ED Discharge Orders    None       Delora Fuel, MD 37/62/83 [phone]

## 2020-04-10 DIAGNOSIS — Z8673 Personal history of transient ischemic attack (TIA), and cerebral infarction without residual deficits: Secondary | ICD-10-CM | POA: Diagnosis not present

## 2020-04-10 DIAGNOSIS — Z6821 Body mass index (BMI) 21.0-21.9, adult: Secondary | ICD-10-CM | POA: Diagnosis not present

## 2020-04-10 DIAGNOSIS — I350 Nonrheumatic aortic (valve) stenosis: Secondary | ICD-10-CM | POA: Diagnosis not present

## 2020-04-10 DIAGNOSIS — I5032 Chronic diastolic (congestive) heart failure: Secondary | ICD-10-CM | POA: Diagnosis not present

## 2020-04-10 DIAGNOSIS — I4821 Permanent atrial fibrillation: Secondary | ICD-10-CM | POA: Diagnosis not present

## 2020-04-17 DIAGNOSIS — N183 Chronic kidney disease, stage 3 unspecified: Secondary | ICD-10-CM | POA: Diagnosis not present

## 2020-04-17 DIAGNOSIS — I350 Nonrheumatic aortic (valve) stenosis: Secondary | ICD-10-CM | POA: Diagnosis not present

## 2020-04-17 DIAGNOSIS — K219 Gastro-esophageal reflux disease without esophagitis: Secondary | ICD-10-CM | POA: Diagnosis not present

## 2020-04-17 DIAGNOSIS — R7301 Impaired fasting glucose: Secondary | ICD-10-CM | POA: Diagnosis not present

## 2020-04-17 DIAGNOSIS — E782 Mixed hyperlipidemia: Secondary | ICD-10-CM | POA: Diagnosis not present

## 2020-04-17 DIAGNOSIS — D519 Vitamin B12 deficiency anemia, unspecified: Secondary | ICD-10-CM | POA: Diagnosis not present

## 2020-04-17 DIAGNOSIS — I5032 Chronic diastolic (congestive) heart failure: Secondary | ICD-10-CM | POA: Diagnosis not present

## 2020-04-17 DIAGNOSIS — D649 Anemia, unspecified: Secondary | ICD-10-CM | POA: Diagnosis not present

## 2020-04-17 DIAGNOSIS — D529 Folate deficiency anemia, unspecified: Secondary | ICD-10-CM | POA: Diagnosis not present

## 2020-04-17 DIAGNOSIS — E8881 Metabolic syndrome: Secondary | ICD-10-CM | POA: Diagnosis not present

## 2020-04-17 DIAGNOSIS — I10 Essential (primary) hypertension: Secondary | ICD-10-CM | POA: Diagnosis not present

## 2020-04-17 DIAGNOSIS — R5382 Chronic fatigue, unspecified: Secondary | ICD-10-CM | POA: Diagnosis not present

## 2020-04-20 DIAGNOSIS — I10 Essential (primary) hypertension: Secondary | ICD-10-CM | POA: Diagnosis not present

## 2020-04-20 DIAGNOSIS — F331 Major depressive disorder, recurrent, moderate: Secondary | ICD-10-CM | POA: Diagnosis not present

## 2020-04-20 DIAGNOSIS — Z1331 Encounter for screening for depression: Secondary | ICD-10-CM | POA: Diagnosis not present

## 2020-04-20 DIAGNOSIS — Z1389 Encounter for screening for other disorder: Secondary | ICD-10-CM | POA: Diagnosis not present

## 2020-04-20 DIAGNOSIS — I5032 Chronic diastolic (congestive) heart failure: Secondary | ICD-10-CM | POA: Diagnosis not present

## 2020-04-20 DIAGNOSIS — G8194 Hemiplegia, unspecified affecting left nondominant side: Secondary | ICD-10-CM | POA: Diagnosis not present

## 2020-04-20 DIAGNOSIS — I4891 Unspecified atrial fibrillation: Secondary | ICD-10-CM | POA: Diagnosis not present

## 2020-05-04 ENCOUNTER — Telehealth: Payer: Self-pay

## 2020-05-04 NOTE — Telephone Encounter (Signed)
[name] [name] daughter called to check on the referral that was sent from [place]. [phone]

## 2020-05-04 NOTE — Telephone Encounter (Signed)
Spoke with pts daughter and she's aware that pt is scheduled with Dr. Olevia Perches office.

## 2020-05-04 NOTE — Telephone Encounter (Signed)
Pt is scheduled at Dr Olevia Perches office

## 2020-05-08 ENCOUNTER — Ambulatory Visit (INDEPENDENT_AMBULATORY_CARE_PROVIDER_SITE_OTHER): Payer: Medicare Other | Admitting: Gastroenterology

## 2020-05-08 ENCOUNTER — Other Ambulatory Visit: Payer: Self-pay

## 2020-05-08 ENCOUNTER — Encounter (INDEPENDENT_AMBULATORY_CARE_PROVIDER_SITE_OTHER): Payer: Self-pay | Admitting: Gastroenterology

## 2020-05-08 DIAGNOSIS — R110 Nausea: Secondary | ICD-10-CM | POA: Diagnosis not present

## 2020-05-08 DIAGNOSIS — K921 Melena: Secondary | ICD-10-CM | POA: Insufficient documentation

## 2020-05-08 DIAGNOSIS — R1013 Epigastric pain: Secondary | ICD-10-CM | POA: Diagnosis not present

## 2020-05-08 MED ORDER — ONDANSETRON HCL 4 MG PO TABS: 4.0000 mg | ORAL_TABLET | Freq: Three times a day (TID) | ORAL | 1 refills | Status: DC | PRN

## 2020-05-08 MED ORDER — HYOSCYAMINE SULFATE 0.125 MG SL SUBL: 0.1250 mg | SUBLINGUAL_TABLET | Freq: Four times a day (QID) | SUBLINGUAL | 0 refills | Status: DC | PRN

## 2020-05-08 MED ORDER — ONDANSETRON HCL 4 MG PO TABS: 4.0000 mg | ORAL_TABLET | Freq: Three times a day (TID) | ORAL | 1 refills | Status: AC | PRN

## 2020-05-08 MED ORDER — HYOSCYAMINE SULFATE 0.125 MG SL SUBL: 0.1250 mg | SUBLINGUAL_TABLET | Freq: Four times a day (QID) | SUBLINGUAL | 2 refills | Status: DC | PRN

## 2020-05-08 MED ORDER — HYOSCYAMINE SULFATE 0.125 MG SL SUBL: 0.1250 mg | SUBLINGUAL_TABLET | SUBLINGUAL | 0 refills | Status: DC | PRN

## 2020-05-08 NOTE — Addendum Note (Signed)
Addended by: Harvel Quale on: 05/08/2020 08:14 PM   Modules accepted: Orders

## 2020-05-08 NOTE — Progress Notes (Addendum)
[name] [name], M.D. Gastroenterology & Hepatology [facility] [address] [place], [place] [address] Primary Care Physician: Caryl Bis, MD [place] [id number]  Referring MD: PCP  I will communicate my assessment and recommendations to the referring MD via EMR. Note: Occasional unusual wording and randomly placed punctuation marks may result from the use of speech recognition technology to transcribe this document"  Chief Complaint:  Diarrhea, nausea and episodes of hemoptysis.  History of Present Illness: [name] [name] is a 86 y.o. female with past medical history of atrial fibrillation on Eliquis, coronary artery disease status post PCI, hypertension, hyperlipidemia, PMR, IBS, embolic stroke who presents for evaluation of episodes of nonbloody diarrhea, epigastric pain and nausea with dry heaves.  The patient reports that she has had diarrhea on a daily basis for multiple years, reports she has 1-2 bowel movements per day without any blood on a constant basis but occasionally she has some rectal bleeding.  She states that she was concerned as for the last month she has presented epigastric abdominal pain that radiates to her lower abdomen as well as intermittent episodes of melena described as tarry stool.  She has had occasional episodes of nausea with dry heaving, very occasionally she has episodes of vomiting.  She has lost close to 37 pounds since last year unintentionally.  The patient also reports that she has constantly been clearing her throat and sometimes spits some blood in the early morning but has not had any episodes of hematemesis. The patient denies having any fever, chills,  hematemesis, abdominal distention,  jaundice, pruritus.  She states she has a history of IBS for multiple years.  No abdominal imaging is available since 2020.  The patient came to the [facility] ER on 03/30/2020 due to  similar symptoms, at that time patient had negative Hemoccult testing, was considered to have hemoptysis in the setting of epistaxis.  Her hemoglobin was stable at 13.5 and no presence of any alterations in her CBC or CMP was found, chest x-ray was negative.  The patient comes with her daughter who is states that the patient is scheduled to see an interventional cardiologist at [facility] next month to repair her aortic valve as she has severe aortic stenosis.  The patient was hesitant to proceed with this surgery.  No recent records of her aortic stenosis are available.     Last EGD: Never Last Colonoscopy: July 2020 at [facility], normal per the patient  FHx: neg for any gastrointestinal/liver disease, no malignancies Social: neg smoking, alcohol or illicit drug use Surgical: non contributory  Past Medical History: Past Medical History:  Diagnosis Date  . Atrial fibrillation ([place])    On Coumadin  . Coronary atherosclerosis of native coronary artery    Nonobstructive at cardiac catheterization 2004  . Depression   . Dysphagia   . Essential hypertension, benign   . Hyperlipidemia   . Polymyalgia rheumatica ([place])   . Stroke [facility])     Past Surgical History: Past Surgical History:  Procedure Laterality Date  . APPENDECTOMY    . COLONOSCOPY  2008   Dr.Fleshiman  . Left ankle    . TOE SURGERY    . TOTAL ABDOMINAL HYSTERECTOMY      Family History: Family History  Problem Relation Age of Onset  . Stroke Mother   . Hypertension Sister   . Hypertension Brother   . Rheum arthritis Daughter   . Healthy  Son   . Duwayne Heck Son   . Healthy Son   . Hypertension Sister   . Heart disease Sister   . Heart disease Brother   . Hypertension Brother   . Arthritis Brother   . Colon cancer Brother   . Hypertension Brother   . Coronary artery disease Other     Social History: Social History   Tobacco Use  Smoking Status Never Smoker  Smokeless Tobacco Never Used   Social History    Substance and Sexual Activity  Alcohol Use No  . Alcohol/week: 0.0 standard drinks   Social History   Substance and Sexual Activity  Drug Use No    Allergies: Allergies  Allergen Reactions  . Sulfa Antibiotics Rash  . Sulfonamide Derivatives Rash    Medications: Current Outpatient Medications  Medication Sig Dispense Refill  . acetaminophen (TYLENOL) 500 MG tablet Take 500-1,000 mg by mouth daily as needed for moderate pain.     Marland Kitchen amLODipine (NORVASC) 5 MG tablet Take by mouth.    Marland Kitchen apixaban (ELIQUIS) 5 MG TABS tablet Take 5 mg by mouth 2 (two) times daily.    Marland Kitchen aspirin EC 81 MG tablet Take 81 mg by mouth daily. OTC    . atorvastatin (LIPITOR) 40 MG tablet Take 40 mg by mouth at bedtime.     . Biotin 1000 MCG tablet Take 1,000 mcg by mouth daily.    Marland Kitchen buPROPion (WELLBUTRIN) 100 MG tablet Take 100 mg by mouth 2 (two) times daily.    . Calcium Carbonate-Vitamin D (CALTRATE 600+D) 600-400 MG-UNIT per tablet Take 1 tablet by mouth daily.      . digoxin (LANOXIN) 0.125 MG tablet Take 1 mcg by mouth daily. Chronic A-fib    . escitalopram (LEXAPRO) 10 MG tablet Take 10 mg by mouth daily.    . furosemide (LASIX) 40 MG tablet Take 40 mg by mouth daily.     . metoprolol tartrate (LOPRESSOR) 100 MG tablet Take 100 mg by mouth 2 (two) times daily.    . ondansetron (ZOFRAN) 4 MG tablet Take 1 tablet (4 mg total) by mouth every 8 (eight) hours as needed for nausea or vomiting. 30 tablet 1  . potassium chloride SA (K-DUR,KLOR-CON) 20 MEQ tablet TAKE ONE TABLET BY MOUTH EVERY DAY FOR LOW POTASSIUM  6  . predniSONE (DELTASONE) 5 MG tablet Take 10 mg by mouth daily.     . famotidine (PEPCID) 20 MG tablet Take 1 tablet by mouth 2 (two) times daily. (Patient not taking: Reported on 05/08/2020)    . warfarin (COUMADIN) 4 MG tablet Take 2-4 mg by mouth See admin instructions. Patient takes 4mg  on one evening, then takes 2mg  for two evenings, then then takes 4mg , etc. (4mg , 2mg , 2mg , 4mg ,  etc) [place] (Patient not taking: Reported on 05/08/2020)     No current facility-administered medications for this visit.    Review of Systems: GENERAL: negative for malaise, night sweats HEENT: No changes in hearing or vision, no nose bleeds or other nasal problems. NECK: Negative for lumps, goiter, pain and significant neck swelling RESPIRATORY: Negative for cough, wheezing CARDIOVASCULAR: Negative for chest pain, leg swelling, palpitations, orthopnea GI: SEE HPI MUSCULOSKELETAL: Negative for joint pain or swelling, back pain, and muscle pain. SKIN: Negative for lesions, rash PSYCH: Negative for sleep disturbance, mood disorder and recent psychosocial stressors. HEMATOLOGY Negative for prolonged bleeding, bruising easily, and swollen nodes. ENDOCRINE: Negative for cold or heat intolerance, polyuria, polydipsia and goiter. NEURO: negative for tremor,  gait imbalance, syncope and seizures. The remainder of the review of systems is noncontributory.   Physical Exam: BP (!) 178/74 (BP Location: Right Arm, Patient Position: Sitting, Cuff Size: Normal)   Pulse 60   Temp (!) 97.3 F (36.3 C) (Oral)   Ht 5' 1.5" (1.562 m)   Wt 127 lb 3.2 oz (57.7 kg)   BMI 23.65 kg/m  GENERAL: The patient is AO x3, in no acute distress.  Patient sitting in chair but brings a walker.  Patient is slim. HEENT: Head is normocephalic and atraumatic. EOMI are intact. Mouth is well hydrated and without lesions. NECK: Supple. No masses LUNGS: Clear to auscultation. No presence of rhonchi/wheezing/rales. Adequate chest expansion HEART: RRR, normal s1 and s2. ABDOMEN: Mildly tender to palpation in the epigastric area, no guarding, no peritoneal signs, and nondistended. BS +. No masses. EXTREMITIES: Without any cyanosis, clubbing, rash, lesions or edema. NEUROLOGIC: AOx3, no focal motor deficit. SKIN: no jaundice, no rashes   Imaging/Labs: as above  I personally reviewed and interpreted the  available labs, imaging and endoscopic files.  Impression and Plan: JACYNDA BRUNKE is a 86 y.o. female with past medical history of atrial fibrillation on Eliquis, coronary artery disease status post PCI, hypertension, hyperlipidemia, PMR, IBS, embolic stroke who presents for evaluation of episodes of nonbloody diarrhea, epigastric pain and nausea with dry heaves.  The patient has presented recent onset of symptoms characterized predominantly by significant abdominal pain.  She also has nausea constantly and weight loss.  I consider this should be further investigated with CT scan of the abdomen and pelvis with IV contrast, there is also need for an EGD to rule out any endoluminal diseases causing her symptomatology but the patient has significant aortic stenosis that precludes any sort of sedation at this moment.  I encouraged her to proceed with her aortic valve procedure before we consider proceeding with any endoscopic intervention.  Also, the patient will benefit from antispasmodic to decrease her abdominal pain and Zofran for nausea.  She understood and agreed.  -Schedule CT abdomen/pelvis with IV contrast -Start Levsin every 6 h PRN for abdominal pain -Start zofran every 8 PRN for nausea -Patient to proceed with aortic valve intervention before considering an endoscopic evaluation - RTC 2 months  All questions were answered.      [name] Peppers, MD Gastroenterology and Hepatology [facility]

## 2020-05-08 NOTE — Patient Instructions (Signed)
Schedule CT abdomen/pelvis with IV contrast Start Levsin every 6 h PRN for abdominal pain Start zofran every 8 PRN for nausea

## 2020-05-12 ENCOUNTER — Encounter (HOSPITAL_COMMUNITY): Payer: Self-pay | Admitting: Emergency Medicine

## 2020-05-12 ENCOUNTER — Observation Stay (HOSPITAL_COMMUNITY)
Admission: EM | Admit: 2020-05-12 | Discharge: 2020-05-14 | Disposition: A | Discharge status: Home / Self Care | Payer: Medicare Other | Attending: Internal Medicine | Admitting: Internal Medicine

## 2020-05-12 ENCOUNTER — Other Ambulatory Visit: Payer: Self-pay

## 2020-05-12 DIAGNOSIS — K922 Gastrointestinal hemorrhage, unspecified: Secondary | ICD-10-CM

## 2020-05-12 DIAGNOSIS — I700 Atherosclerosis of aorta: Secondary | ICD-10-CM | POA: Diagnosis not present

## 2020-05-12 DIAGNOSIS — I2510 Atherosclerotic heart disease of native coronary artery without angina pectoris: Secondary | ICD-10-CM | POA: Diagnosis not present

## 2020-05-12 DIAGNOSIS — I482 Chronic atrial fibrillation, unspecified: Secondary | ICD-10-CM | POA: Diagnosis not present

## 2020-05-12 DIAGNOSIS — E785 Hyperlipidemia, unspecified: Secondary | ICD-10-CM | POA: Insufficient documentation

## 2020-05-12 DIAGNOSIS — I350 Nonrheumatic aortic (valve) stenosis: Secondary | ICD-10-CM

## 2020-05-12 DIAGNOSIS — F329 Major depressive disorder, single episode, unspecified: Secondary | ICD-10-CM | POA: Diagnosis not present

## 2020-05-12 DIAGNOSIS — R109 Unspecified abdominal pain: Secondary | ICD-10-CM | POA: Insufficient documentation

## 2020-05-12 DIAGNOSIS — I5032 Chronic diastolic (congestive) heart failure: Secondary | ICD-10-CM | POA: Diagnosis not present

## 2020-05-12 DIAGNOSIS — Z8673 Personal history of transient ischemic attack (TIA), and cerebral infarction without residual deficits: Secondary | ICD-10-CM | POA: Diagnosis not present

## 2020-05-12 DIAGNOSIS — Z79899 Other long term (current) drug therapy: Secondary | ICD-10-CM | POA: Diagnosis not present

## 2020-05-12 DIAGNOSIS — Z20822 Contact with and (suspected) exposure to covid-19: Secondary | ICD-10-CM | POA: Diagnosis not present

## 2020-05-12 DIAGNOSIS — K921 Melena: Secondary | ICD-10-CM | POA: Diagnosis present

## 2020-05-12 DIAGNOSIS — K625 Hemorrhage of anus and rectum: Principal | ICD-10-CM | POA: Insufficient documentation

## 2020-05-12 DIAGNOSIS — Z7982 Long term (current) use of aspirin: Secondary | ICD-10-CM | POA: Diagnosis not present

## 2020-05-12 DIAGNOSIS — I110 Hypertensive heart disease with heart failure: Secondary | ICD-10-CM | POA: Insufficient documentation

## 2020-05-12 DIAGNOSIS — K7689 Other specified diseases of liver: Secondary | ICD-10-CM | POA: Diagnosis not present

## 2020-05-12 DIAGNOSIS — I10 Essential (primary) hypertension: Secondary | ICD-10-CM | POA: Diagnosis present

## 2020-05-12 DIAGNOSIS — R8281 Pyuria: Secondary | ICD-10-CM | POA: Diagnosis present

## 2020-05-12 DIAGNOSIS — K5730 Diverticulosis of large intestine without perforation or abscess without bleeding: Secondary | ICD-10-CM | POA: Diagnosis not present

## 2020-05-12 DIAGNOSIS — I4891 Unspecified atrial fibrillation: Secondary | ICD-10-CM | POA: Diagnosis not present

## 2020-05-12 DIAGNOSIS — M353 Polymyalgia rheumatica: Secondary | ICD-10-CM | POA: Diagnosis present

## 2020-05-12 DIAGNOSIS — N281 Cyst of kidney, acquired: Secondary | ICD-10-CM | POA: Diagnosis not present

## 2020-05-12 LAB — CBC
HCT: 37.9 % (ref 36.0–46.0)
Hemoglobin: 11.5 g/dL — ABNORMAL LOW (ref 12.0–15.0)
MCH: 28.5 pg (ref 26.0–34.0)
MCHC: 30.3 g/dL (ref 30.0–36.0)
MCV: 94 fL (ref 80.0–100.0)
Platelets: 333 10*3/uL (ref 150–400)
RBC: 4.03 MIL/uL (ref 3.87–5.11)
RDW: 14.5 % (ref 11.5–15.5)
WBC: 10.7 10*3/uL — ABNORMAL HIGH (ref 4.0–10.5)
nRBC: 0 % (ref 0.0–0.2)

## 2020-05-12 LAB — COMPREHENSIVE METABOLIC PANEL
ALT: 25 U/L (ref 0–44)
AST: 30 U/L (ref 15–41)
Albumin: 3.8 g/dL (ref 3.5–5.0)
Alkaline Phosphatase: 50 U/L (ref 38–126)
Anion gap: 13 (ref 5–15)
BUN: 10 mg/dL (ref 8–23)
CO2: 20 mmol/L — ABNORMAL LOW (ref 22–32)
Calcium: 9.3 mg/dL (ref 8.9–10.3)
Chloride: 103 mmol/L (ref 98–111)
Creatinine, Ser: 1.25 mg/dL — ABNORMAL HIGH (ref 0.44–1.00)
GFR calc Af Amer: 44 mL/min — ABNORMAL LOW (ref >60)
GFR calc non Af Amer: 38 mL/min — ABNORMAL LOW (ref >60)
Glucose, Bld: 138 mg/dL — ABNORMAL HIGH (ref 70–99)
Potassium: 3.7 mmol/L (ref 3.5–5.1)
Sodium: 136 mmol/L (ref 135–145)
Total Bilirubin: 1 mg/dL (ref 0.3–1.2)
Total Protein: 7.2 g/dL (ref 6.5–8.1)

## 2020-05-12 LAB — TYPE AND SCREEN
ABO/RH(D): A POS
Antibody Screen: NEGATIVE

## 2020-05-12 NOTE — ED Provider Notes (Signed)
[facility] EMERGENCY DEPARTMENT Provider Note   CSN: [id number] Arrival date & time: 05/12/20  1252     History Chief Complaint  Patient presents with   Rectal Bleeding    [name] [name] is a 86 y.o. female.  Brought to the emergency department for evaluation of rectal bleeding.  She has been experiencing intermittent episodes of upper abdominal pain and black stools.  She saw her GI doctor in [place] who considered endoscopy, however patient has critical aortic stenosis and therefore endoscopy was deferred.  She is apparently scheduled for aortic valve repair next month.  Also reports a history of hemorrhoids.  She has had some intermittent hemorrhoid bleeding in the past.  For the last 2 days, however, she has had persistent leakage of bright red blood from her rectum.  Patient does endorse intermittent left-sided chest pain.  She had some pain earlier today.  It is not present currently.  No shortness of breath.        Past Medical History:  Diagnosis Date   Atrial fibrillation ([place])    On Coumadin   Coronary atherosclerosis of native coronary artery    Nonobstructive at cardiac catheterization 2004   Depression    Dysphagia    Essential hypertension, benign    Hyperlipidemia    Polymyalgia rheumatica ([place])    Stroke [facility])     Patient Active Problem List   Diagnosis Date Noted   Abdominal pain, epigastric 05/08/2020   Melena 05/08/2020   Nausea without vomiting 05/08/2020   Bilateral hearing loss 12/20/2019   Calcific aortic stenosis 12/20/2019   Fatigue 12/20/2019   Symptomatic bradycardia 12/20/2019   Closed fracture of multiple ribs of right side with routine healing 11/08/2019   Closed fracture of multiple pubic rami, right, initial encounter ([place]) 11/08/2019   Trauma 11/07/2019   History of cardioembolic cerebrovascular accident (CVA) 10/11/2019   Nonrheumatic aortic valve stenosis 10/11/2019   Avulsion fracture  of lateral condyle of left tibia 03/15/2019   Rectal bleeding 02/24/2019   Atrial fibrillation with rapid ventricular response ([place]) 03/21/2018   Depression 03/21/2018   Chest pain 01/21/2018   Hypokalemia    Hypoxia    SOB (shortness of breath)    Acute bronchitis    Atypical chest pain 09/25/2015   CKD (chronic kidney disease) stage 2, GFR 60-89 ml/min 86/76/7209   Acute diastolic CHF (congestive heart failure) ([place]) 47/04/6282   Acute diastolic congestive heart failure ([place]) 12/29/2014   Dyspnea    Chronic diarrhea 07/04/2014   Coronary atherosclerosis of native coronary artery 03/28/2014   Long term (current) use of anticoagulants 11/26/2010   Hyperlipidemia 05/11/2007   Essential hypertension, benign 05/11/2007   Chronic atrial fibrillation ([place]) 05/11/2007   Polymyalgia rheumatica ([place]) 05/11/2007    Past Surgical History:  Procedure Laterality Date   APPENDECTOMY     COLONOSCOPY  2008   Dr.Fleshiman   Left ankle     TOE SURGERY     TOTAL ABDOMINAL HYSTERECTOMY       OB History    Gravida      Para      Term      Preterm      AB      Living  4     SAB      TAB      Ectopic      Multiple      Live Births  Family History  Problem Relation Age of Onset   Stroke Mother    Hypertension Sister    Hypertension Brother    Rheum arthritis Daughter    Healthy Son    Healthy Son    Healthy Son    Hypertension Sister    Heart disease Sister    Heart disease Brother    Hypertension Brother    Arthritis Brother    Colon cancer Brother    Hypertension Brother    Coronary artery disease Other     Social History   Tobacco Use   Smoking status: Never Smoker   Smokeless tobacco: Never Used  Scientific laboratory technician Use: Never used  Substance Use Topics   Alcohol use: No    Alcohol/week: 0.0 standard drinks   Drug use: No    Home Medications Prior to Admission medications   Medication  Sig Start Date End Date Taking? Authorizing Provider  acetaminophen (TYLENOL) 500 MG tablet Take 500-1,000 mg by mouth daily as needed for moderate pain.    Yes [provider]  alendronate (FOSAMAX) 70 MG tablet Take 70 mg by mouth every Sunday. 02/26/20  Yes [provider]  ALPRAZolam (XANAX) 0.25 MG tablet Take 0.25 mg by mouth every 8 (eight) hours as needed (panic attacks).  12/20/19  Yes [provider]  amLODipine (NORVASC) 5 MG tablet Take 5 mg by mouth daily.    Yes [provider]  apixaban (ELIQUIS) 5 MG TABS tablet Take 5 mg by mouth 2 (two) times daily.   Yes [provider]  aspirin EC 81 MG tablet Take 81 mg by mouth daily. OTC   Yes [provider]  atorvastatin (LIPITOR) 40 MG tablet Take 40 mg by mouth at bedtime.    Yes [provider]  Biotin 1000 MCG tablet Take 1,000 mcg by mouth daily.   Yes [provider]  buPROPion (WELLBUTRIN XL) 300 MG 24 hr tablet Take 300 mg by mouth daily. 04/26/20  Yes [provider]  digoxin (LANOXIN) 0.125 MG tablet Take 0.125 mg by mouth daily. Chronic A-fib    Yes [provider]  escitalopram (LEXAPRO) 10 MG tablet Take 10 mg by mouth daily. 01/18/19  Yes [provider]  furosemide (LASIX) 40 MG tablet Take 40 mg by mouth daily.    Yes [provider]  hydrocortisone 2.5 % cream Apply 1 application topically 2 (two) times daily as needed (rash/itching).  01/03/20  Yes [provider]  metoprolol tartrate (LOPRESSOR) 100 MG tablet Take 100 mg by mouth 2 (two) times daily.   Yes [provider]  ondansetron (ZOFRAN) 4 MG tablet Take 1 tablet (4 mg total) by mouth every 8 (eight) hours as needed for nausea or vomiting. 05/08/20  Yes Montez Morita, Daniel, MD  pantoprazole (PROTONIX) 40 MG tablet Take 40 mg by mouth daily. 04/26/20  Yes [provider]  potassium chloride SA (K-DUR,KLOR-CON) 20 MEQ tablet Take 20 mEq by  mouth daily.  10/01/17  Yes [provider]  predniSONE (DELTASONE) 5 MG tablet Take 5 mg by mouth daily.    Yes [provider]  Vitamin D, Ergocalciferol, (DRISDOL) 1.25 MG (50000 UNIT) CAPS capsule Take 50,000 Units by mouth every Sunday. 03/12/20  Yes [provider]  hyoscyamine (LEVSIN SL) 0.125 MG SL tablet Place 1 tablet (0.125 mg total) under the tongue every 6 (six) hours as needed for cramping. Patient not taking: Reported on 05/12/2020 05/08/20   Jenetta Downer  Roney Marion, MD    Allergies    Sulfa antibiotics and Sulfonamide derivatives  Review of Systems   Review of Systems  Cardiovascular: Positive for chest pain.  Gastrointestinal: Positive for abdominal pain and anal bleeding.  All other systems reviewed and are negative.   Physical Exam Updated Vital Signs BP (!) 189/65 (BP Location: Right Arm)    Pulse 64    Temp 98.2 F (36.8 C) (Oral)    Resp 16    Ht 5\' 2"  (1.575 m)    Wt 57.7 kg    SpO2 100%    BMI 23.27 kg/m   Physical Exam Vitals and nursing note reviewed.  Constitutional:      General: She is not in acute distress.    Appearance: Normal appearance. She is well-developed.  HENT:     Head: Normocephalic and atraumatic.     Right Ear: Hearing normal.     Left Ear: Hearing normal.     Nose: Nose normal.  Eyes:     Conjunctiva/sclera: Conjunctivae normal.     Pupils: Pupils are equal, round, and reactive to light.  Cardiovascular:     Rate and Rhythm: Rhythm irregular.     Heart sounds: S1 normal and S2 normal. No murmur heard.  No friction rub. No gallop.   Pulmonary:     Effort: Pulmonary effort is normal. No respiratory distress.     Breath sounds: Normal breath sounds.  Chest:     Chest wall: No tenderness.  Abdominal:     General: Bowel sounds are normal.     Palpations: Abdomen is soft.     Tenderness: There is abdominal tenderness in the suprapubic area and left lower quadrant. There is no guarding or rebound. Negative  signs include Murphy's sign and McBurney's sign.     Hernia: No hernia is present.  Musculoskeletal:        General: Normal range of motion.     Cervical back: Normal range of motion and neck supple.  Skin:    General: Skin is warm and dry.     Findings: No rash.  Neurological:     Mental Status: She is alert and oriented to person, place, and time.     GCS: GCS eye subscore is 4. GCS verbal subscore is 5. GCS motor subscore is 6.     Cranial Nerves: No cranial nerve deficit.     Sensory: No sensory deficit.     Coordination: Coordination normal.  Psychiatric:        Speech: Speech normal.        Behavior: Behavior normal.        Thought Content: Thought content normal.     ED Results / Procedures / Treatments   Labs (all labs ordered are listed, but only abnormal results are displayed) Labs Reviewed  COMPREHENSIVE METABOLIC PANEL - Abnormal; Notable for the following components:      Result Value   CO2 20 (*)    Glucose, Bld 138 (*)    Creatinine, Ser 1.25 (*)    GFR calc non Af Amer 38 (*)    GFR calc Af Amer 44 (*)    All other components within normal limits  CBC - Abnormal; Notable for the following components:   WBC 10.7 (*)    Hemoglobin 11.5 (*)    All other components within normal limits  PROTIME-INR - Abnormal; Notable for the following components:   Prothrombin Time 17.8 (*)    INR 1.5 (*)  All other components within normal limits  LACTIC ACID, PLASMA - Abnormal; Notable for the following components:   Lactic Acid, Venous 3.0 (*)    All other components within normal limits  URINALYSIS, ROUTINE W REFLEX MICROSCOPIC - Abnormal; Notable for the following components:   APPearance CLOUDY (*)    Hgb urine dipstick SMALL (*)    Leukocytes,Ua LARGE (*)    WBC, UA >50 (*)    Non Squamous Epithelial 0-5 (*)    All other components within normal limits  TROPONIN I (HIGH SENSITIVITY) - Abnormal; Notable for the following components:   Troponin I (High  Sensitivity) 27 (*)    All other components within normal limits  RESPIRATORY PANEL BY RT PCR (FLU A&B, COVID)  TYPE AND SCREEN  TROPONIN I (HIGH SENSITIVITY)    EKG EKG Interpretation  Date/Time:  Saturday May 13 2020 01:08:31 EDT Ventricular Rate:  64 PR Interval:    QRS Duration: 118 QT Interval:  399 QTC Calculation: 418 R Axis:   58 Text Interpretation: Atrial fibrillation Incomplete left bundle branch block Anterior Q waves, possibly due to ILBBB ST depression, consider ischemia, diffuse lds No significant change since last tracing Confirmed by Orpah Greek [phone]) on 05/13/2020 1:11:03 AM   Radiology CT ABDOMEN PELVIS W CONTRAST  Result Date: 05/13/2020 CLINICAL DATA:  Bright red rectal bleeding since yesterday. EXAM: CT ABDOMEN AND PELVIS WITH CONTRAST TECHNIQUE: Multidetector CT imaging of the abdomen and pelvis was performed using the standard protocol following bolus administration of intravenous contrast. CONTRAST:  7mL OMNIPAQUE IOHEXOL 300 MG/ML  SOLN COMPARISON:  None. FINDINGS: Lower chest: Atelectasis in the left lung base. Cardiac enlargement. Small pericardial effusion. Hepatobiliary: Scattered hepatic parenchymal cysts. Largest measures about 1.8 cm diameter. Gallbladder and bile ducts are normal. Pancreas: Unremarkable. No pancreatic ductal dilatation or surrounding inflammatory changes. Spleen: Normal in size without focal abnormality. Adrenals/Urinary Tract: No adrenal gland nodules. Multiple cysts in both kidneys. Bilateral renal parenchymal scarring. Nephrograms are otherwise homogeneous. No hydronephrosis or hydroureter. Bladder is unremarkable. Stomach/Bowel: Stomach, small bowel, and colon are mostly decompressed. Sigmoid colonic diverticulosis. No evidence of diverticulitis. No obstructing lesions identified. Appendix is not identified. Vascular/Lymphatic: Aortic atherosclerosis. No enlarged abdominal or pelvic lymph nodes. Reproductive: Status post  hysterectomy. No adnexal masses. Other: No free air or free fluid in the abdomen. Abdominal wall musculature appears intact. Musculoskeletal: Degenerative changes in the spine. Tarlov cysts in the sacrum. No destructive bone lesions. IMPRESSION: 1. No acute process demonstrated in the abdomen or pelvis. No evidence of bowel obstruction or inflammation. Sigmoid colonic diverticulosis without evidence of diverticulitis. 2. Cardiac enlargement with small pericardial effusion. 3. Multiple hepatic and renal cysts. Bilateral renal parenchymal scarring. 4. Aortic atherosclerosis. Aortic Atherosclerosis (ICD10-I70.0). Electronically Signed   By: Lucienne Capers M.D.   On: 05/13/2020 00:53    Procedures Procedures (including critical care time)  Medications Ordered in ED Medications  iohexol (OMNIPAQUE) 300 MG/ML solution 80 mL (80 mLs Intravenous Contrast Given 05/13/20 0020)    ED Course  I have reviewed the triage vital signs and the nursing notes.  Pertinent labs & imaging results that were available during my care of the patient were reviewed by me and considered in my medical decision making (see chart for details).    MDM Rules/Calculators/A&P                          Patient presents with rectal bleeding.  She has a history  of atrial fibrillation, chronically on Eliquis.  Her last dose of Eliquis was this morning.  She reportedly has been having episodes of dark stools, possible melena earlier in the month.  Endoscopy was not recommended because of her aortic stenosis.  Patient now experiencing bright red blood per rectum.  She has not unstable, no hypotension.  She does have a history of diverticular disease.  She had some left lower quadrant tenderness on palpation but no guarding or signs of peritonitis.  CT scan does not show acute infection.  Hemoglobin is down from her baseline but still above 11.  She continues to have small amounts of gross blood per rectum here in the emergency  department therefore requires hospitalization for further monitoring of hemoglobin.  CRITICAL CARE Performed by: Orpah Greek   Total critical care time: 30 minutes  Critical care time was exclusive of separately billable procedures and treating other patients.  Critical care was necessary to treat or prevent imminent or life-threatening deterioration.  Critical care was time spent personally by me on the following activities: development of treatment plan with patient and/or surrogate as well as nursing, discussions with consultants, evaluation of patient's response to treatment, examination of patient, obtaining history from patient or surrogate, ordering and performing treatments and interventions, ordering and review of laboratory studies, ordering and review of radiographic studies, pulse oximetry and re-evaluation of patient's condition.   Final Clinical Impression(s) / ED Diagnoses Final diagnoses:  Lower GI bleed    Rx / DC Orders ED Discharge Orders    None       Lennyn Bellanca, Gwenyth Allegra, MD 05/13/20 [phone]

## 2020-05-12 NOTE — ED Triage Notes (Signed)
Pt is on blood thinners.  

## 2020-05-12 NOTE — ED Triage Notes (Signed)
Family report excessive bright red rectal bleed since yesterday not getting any better.

## 2020-05-13 ENCOUNTER — Emergency Department (HOSPITAL_COMMUNITY): Payer: Medicare Other

## 2020-05-13 DIAGNOSIS — K5730 Diverticulosis of large intestine without perforation or abscess without bleeding: Secondary | ICD-10-CM | POA: Diagnosis not present

## 2020-05-13 DIAGNOSIS — K922 Gastrointestinal hemorrhage, unspecified: Secondary | ICD-10-CM | POA: Diagnosis not present

## 2020-05-13 DIAGNOSIS — K921 Melena: Secondary | ICD-10-CM

## 2020-05-13 DIAGNOSIS — I482 Chronic atrial fibrillation, unspecified: Secondary | ICD-10-CM

## 2020-05-13 DIAGNOSIS — I350 Nonrheumatic aortic (valve) stenosis: Secondary | ICD-10-CM | POA: Diagnosis not present

## 2020-05-13 DIAGNOSIS — I5032 Chronic diastolic (congestive) heart failure: Secondary | ICD-10-CM | POA: Diagnosis not present

## 2020-05-13 DIAGNOSIS — K7689 Other specified diseases of liver: Secondary | ICD-10-CM | POA: Diagnosis not present

## 2020-05-13 DIAGNOSIS — K625 Hemorrhage of anus and rectum: Secondary | ICD-10-CM

## 2020-05-13 DIAGNOSIS — I700 Atherosclerosis of aorta: Secondary | ICD-10-CM | POA: Diagnosis not present

## 2020-05-13 DIAGNOSIS — N281 Cyst of kidney, acquired: Secondary | ICD-10-CM | POA: Diagnosis not present

## 2020-05-13 DIAGNOSIS — R8281 Pyuria: Secondary | ICD-10-CM | POA: Diagnosis not present

## 2020-05-13 DIAGNOSIS — I10 Essential (primary) hypertension: Secondary | ICD-10-CM | POA: Diagnosis not present

## 2020-05-13 DIAGNOSIS — M353 Polymyalgia rheumatica: Secondary | ICD-10-CM

## 2020-05-13 DIAGNOSIS — D500 Iron deficiency anemia secondary to blood loss (chronic): Secondary | ICD-10-CM | POA: Diagnosis not present

## 2020-05-13 LAB — HEMOGLOBIN AND HEMATOCRIT, BLOOD
HCT: 31.4 % — ABNORMAL LOW (ref 36.0–46.0)
HCT: 30.1 % — ABNORMAL LOW (ref 36.0–46.0)
Hemoglobin: 9.9 g/dL — ABNORMAL LOW (ref 12.0–15.0)
Hemoglobin: 9.4 g/dL — ABNORMAL LOW (ref 12.0–15.0)

## 2020-05-13 LAB — TROPONIN I (HIGH SENSITIVITY)
Troponin I (High Sensitivity): 27 ng/L — ABNORMAL HIGH (ref <18)
Troponin I (High Sensitivity): 26 ng/L — ABNORMAL HIGH (ref <18)

## 2020-05-13 LAB — PROTIME-INR
INR: 1.5 — ABNORMAL HIGH (ref 0.8–1.2)
Prothrombin Time: 17.8 seconds — ABNORMAL HIGH (ref 11.4–15.2)

## 2020-05-13 LAB — RESPIRATORY PANEL BY RT PCR (FLU A&B, COVID)
Influenza A by PCR: NEGATIVE
Influenza B by PCR: NEGATIVE
SARS Coronavirus 2 by RT PCR: NEGATIVE

## 2020-05-13 LAB — URINALYSIS, ROUTINE W REFLEX MICROSCOPIC
Bacteria, UA: NONE SEEN
Bilirubin Urine: NEGATIVE
Glucose, UA: NEGATIVE mg/dL
Ketones, ur: NEGATIVE mg/dL
Nitrite: NEGATIVE
Protein, ur: NEGATIVE mg/dL
Specific Gravity, Urine: 1.021 (ref 1.005–1.030)
WBC, UA: >50 WBC/hpf — ABNORMAL HIGH (ref 0–5)
pH: 6 (ref 5.0–8.0)

## 2020-05-13 LAB — BASIC METABOLIC PANEL
Anion gap: 9 (ref 5–15)
BUN: 12 mg/dL (ref 8–23)
CO2: 26 mmol/L (ref 22–32)
Calcium: 9 mg/dL (ref 8.9–10.3)
Chloride: 104 mmol/L (ref 98–111)
Creatinine, Ser: 1.16 mg/dL — ABNORMAL HIGH (ref 0.44–1.00)
GFR calc Af Amer: 49 mL/min — ABNORMAL LOW (ref >60)
GFR calc non Af Amer: 42 mL/min — ABNORMAL LOW (ref >60)
Glucose, Bld: 100 mg/dL — ABNORMAL HIGH (ref 70–99)
Potassium: 3.4 mmol/L — ABNORMAL LOW (ref 3.5–5.1)
Sodium: 139 mmol/L (ref 135–145)

## 2020-05-13 LAB — CBC
HCT: 31.8 % — ABNORMAL LOW (ref 36.0–46.0)
Hemoglobin: 10 g/dL — ABNORMAL LOW (ref 12.0–15.0)
MCH: 28.7 pg (ref 26.0–34.0)
MCHC: 31.4 g/dL (ref 30.0–36.0)
MCV: 91.4 fL (ref 80.0–100.0)
Platelets: 281 10*3/uL (ref 150–400)
RBC: 3.48 MIL/uL — ABNORMAL LOW (ref 3.87–5.11)
RDW: 14.6 % (ref 11.5–15.5)
WBC: 7.8 10*3/uL (ref 4.0–10.5)
nRBC: 0 % (ref 0.0–0.2)

## 2020-05-13 LAB — DIGOXIN LEVEL: Digoxin Level: 1.6 ng/mL (ref 1.0–2.0)

## 2020-05-13 LAB — LACTIC ACID, PLASMA: Lactic Acid, Venous: 3 mmol/L (ref 0.5–1.9)

## 2020-05-13 MED ORDER — ACETAMINOPHEN 650 MG RE SUPP: 650.0000 mg | Freq: Four times a day (QID) | RECTAL | Status: DC | PRN

## 2020-05-13 MED ORDER — ONDANSETRON HCL 4 MG/2ML IJ SOLN: 4.0000 mg | Freq: Four times a day (QID) | INTRAMUSCULAR | Status: DC | PRN

## 2020-05-13 MED ORDER — BUPROPION HCL ER (XL) 150 MG PO TB24
300.0000 mg | ORAL_TABLET | Freq: Every day | ORAL | Status: DC
  Administered 2020-05-13 – 2020-05-14 (×2): 300 mg via ORAL
  Filled 2020-05-13 (×2): qty 2

## 2020-05-13 MED ORDER — AMLODIPINE BESYLATE 5 MG PO TABS
5.0000 mg | ORAL_TABLET | Freq: Every day | ORAL | Status: DC
  Administered 2020-05-13 – 2020-05-14 (×2): 5 mg via ORAL
  Filled 2020-05-13 (×2): qty 1

## 2020-05-13 MED ORDER — ALPRAZOLAM 0.25 MG PO TABS: 0.2500 mg | ORAL_TABLET | Freq: Three times a day (TID) | ORAL | Status: DC | PRN

## 2020-05-13 MED ORDER — SODIUM CHLORIDE 0.9 % IV SOLN: INTRAVENOUS | Status: DC

## 2020-05-13 MED ORDER — PREDNISONE 5 MG PO TABS
5.0000 mg | ORAL_TABLET | Freq: Every day | ORAL | Status: DC
  Administered 2020-05-13 – 2020-05-14 (×2): 5 mg via ORAL
  Filled 2020-05-13 (×3): qty 1

## 2020-05-13 MED ORDER — METOPROLOL TARTRATE 100 MG PO TABS
100.0000 mg | ORAL_TABLET | Freq: Two times a day (BID) | ORAL | Status: DC
  Administered 2020-05-13 – 2020-05-14 (×3): 100 mg via ORAL
  Filled 2020-05-13 (×3): qty 1

## 2020-05-13 MED ORDER — BIOTIN 1000 MCG PO TABS: 1000.0000 ug | ORAL_TABLET | Freq: Every day | ORAL | Status: DC

## 2020-05-13 MED ORDER — ACETAMINOPHEN 325 MG PO TABS: 650.0000 mg | ORAL_TABLET | Freq: Four times a day (QID) | ORAL | Status: DC | PRN

## 2020-05-13 MED ORDER — POTASSIUM CHLORIDE CRYS ER 20 MEQ PO TBCR
20.0000 meq | EXTENDED_RELEASE_TABLET | Freq: Once | ORAL | Status: AC
  Administered 2020-05-13: 20 meq via ORAL
  Filled 2020-05-13: qty 1

## 2020-05-13 MED ORDER — HYDRALAZINE HCL 25 MG PO TABS: 25.0000 mg | ORAL_TABLET | Freq: Four times a day (QID) | ORAL | Status: DC | PRN

## 2020-05-13 MED ORDER — IOHEXOL 300 MG/ML  SOLN
80.0000 mL | Freq: Once | INTRAMUSCULAR | Status: AC | PRN
  Administered 2020-05-13: 80 mL via INTRAVENOUS

## 2020-05-13 MED ORDER — ESCITALOPRAM OXALATE 10 MG PO TABS
10.0000 mg | ORAL_TABLET | Freq: Every day | ORAL | Status: DC
  Administered 2020-05-13 – 2020-05-14 (×2): 10 mg via ORAL
  Filled 2020-05-13 (×2): qty 1

## 2020-05-13 MED ORDER — VITAMIN D (ERGOCALCIFEROL) 1.25 MG (50000 UNIT) PO CAPS
50000.0000 [IU] | ORAL_CAPSULE | ORAL | Status: DC
  Administered 2020-05-14: 50000 [IU] via ORAL
  Filled 2020-05-13: qty 1

## 2020-05-13 MED ORDER — ONDANSETRON HCL 4 MG PO TABS: 4.0000 mg | ORAL_TABLET | Freq: Four times a day (QID) | ORAL | Status: DC | PRN

## 2020-05-13 MED ORDER — ATORVASTATIN CALCIUM 40 MG PO TABS
40.0000 mg | ORAL_TABLET | Freq: Every day | ORAL | Status: DC
  Administered 2020-05-13: 40 mg via ORAL
  Filled 2020-05-13: qty 1

## 2020-05-13 MED ORDER — PANTOPRAZOLE SODIUM 40 MG PO TBEC
40.0000 mg | DELAYED_RELEASE_TABLET | Freq: Every day | ORAL | Status: DC
  Administered 2020-05-13 – 2020-05-14 (×2): 40 mg via ORAL
  Filled 2020-05-13 (×2): qty 1

## 2020-05-13 MED ORDER — DIGOXIN 125 MCG PO TABS
0.1250 mg | ORAL_TABLET | Freq: Every day | ORAL | Status: DC
  Administered 2020-05-13 – 2020-05-14 (×2): 0.125 mg via ORAL
  Filled 2020-05-13 (×2): qty 1

## 2020-05-13 NOTE — ED Notes (Signed)
Patient transported to CT scan . 

## 2020-05-13 NOTE — Care Management Obs Status (Signed)
[facility] NOTIFICATION   Patient Details  Name: [name] [name] MRN: [id number] Date of Birth: Feb 29, 1930   Medicare Observation Status Notification Given:  Yes    Claudie Leach, RN 05/13/2020, 6:34 PM

## 2020-05-13 NOTE — Progress Notes (Signed)
  Patient seen and examined personally, I reviewed the chart, history and physical and admission note, done by admitting physician this morning and agree with the same with following addendum.  Please refer to the morning admission note for more detailed plan of care.  Briefly,  86 year old female with history of A. fib on Eliquis prior history of hypertension, PMR on chronic prednisone, severe AS with plans for TAVR next month who has been having chronic diarrhea intermittent episodes of abdominal pain black tarry stools which has been more recently. She was seen in. Seen by GI on 9/20 consider EGD however wanted patient to get her critical AAS addressed next month so deferred for the moment, hemoglobin has been stable at 13.5 g and Hemoccult negative in early August, with history of hemorrhoids. She presented to the ED with 2 days history of persistent leakage of bright red blood per rectum. Seen in the ED, hemoglobin is 11.5 g GI was consulted and admitted for further work-up.  Seen this am She reports she saw GI doctor- Dr Benson Norway early this am She said she told Dr Benson Norway she is not going to have another colonoscopy. Patient otherwise denies any nausea, vomiting, chest pain, shortness of breath, fever, chills, headache, focal weakness, numbness tingling, speech difficulties  ON EXAM: Mild tenderness onf LLQ Lung clear, on ra AAOX3  issues BRBPR/? Intermittent melena/chronic diarrhea/history of hemorrhoids: npo, gi on consult. Await further recs. Add ns at 30 ml/hr while npo x 24 hrs and watch for fluid overload. Will order serial h/h.  A. fib on Eliquis on metoprolol and digoxin. Eliquis on hold Severe aortic stenosis with plan for TAVR next month. She says it is scheduled for Oct the 10th at [facility] Hypertension on metoprolol, Norvasc BP poorly controlled added as needed hydralazine p.o. Pyuria urine culture ordered. WC count improved 7.8K, afebrile. PMR on chronic prednisone Chronic  diastolic CHF holding Lasix as NPO Mild hypokalemia replacement ordered

## 2020-05-13 NOTE — H&P (Signed)
History and Physical    [name] [name]:[id number] DOB: [birth year] DOA: 05/12/2020  PCP: Caryl Bis, MD  Patient coming from: Home  I have personally briefly reviewed patient's old medical records in [place]  Chief Complaint: BRBPR  HPI: [name] [name] is a 86 y.o. female with medical history significant of A.Fib on eliquis, prior stroke, HTN, PMR on chronic prednisone, severe AS with plans for TAVR next month.  Pt has been having chronic diarrhea, intermittent episodes of abd pain and black tarry stools more recently.  Saw GI in [place] (see note 9/20) who considered EGD; however, wanted pt to get her critical AS addressed first next month so deferred for the moment.  Did note that HGB had been stable at 13.5 and hemoccult neg in early Aug.  Also reports a history of hemorrhoids.  She has had some intermittent hemorrhoid bleeding in the past.   Today pt presents to ED with 2 days of persistent leaking of BRB from rectum.  On ROS: does have intermittent L sided CP, had some earlier in the day but none currently.  No SOB.   ED Course: HGB 11.5, lactate 3.0.  INR 1.5.  UA with pyuria.  WBC 10.7k.    Review of Systems: As per HPI, otherwise all review of systems negative.  Past Medical History:  Diagnosis Date  . Atrial fibrillation ([place])    On Coumadin  . Coronary atherosclerosis of native coronary artery    Nonobstructive at cardiac catheterization 2004  . Depression   . Dysphagia   . Essential hypertension, benign   . Hyperlipidemia   . Polymyalgia rheumatica ([place])   . Stroke [facility])     Past Surgical History:  Procedure Laterality Date  . APPENDECTOMY    . COLONOSCOPY  2008   Dr.Fleshiman  . Left ankle    . TOE SURGERY    . TOTAL ABDOMINAL HYSTERECTOMY       reports that she has never smoked. She has never used smokeless tobacco. She reports that she does not drink alcohol and does not use drugs.  Allergies  Allergen Reactions  . Sulfa  Antibiotics Rash  . Sulfonamide Derivatives Rash    Family History  Problem Relation Age of Onset  . Stroke Mother   . Hypertension Sister   . Hypertension Brother   . Rheum arthritis Daughter   . Healthy Son   . Healthy Son   . Healthy Son   . Hypertension Sister   . Heart disease Sister   . Heart disease Brother   . Hypertension Brother   . Arthritis Brother   . Colon cancer Brother   . Hypertension Brother   . Coronary artery disease Other      Prior to Admission medications   Medication Sig Start Date End Date Taking? Authorizing Provider  acetaminophen (TYLENOL) 500 MG tablet Take 500-1,000 mg by mouth daily as needed for moderate pain.    Yes [provider]  alendronate (FOSAMAX) 70 MG tablet Take 70 mg by mouth every Sunday. 02/26/20  Yes [provider]  ALPRAZolam (XANAX) 0.25 MG tablet Take 0.25 mg by mouth every 8 (eight) hours as needed (panic attacks).  12/20/19  Yes [provider]  amLODipine (NORVASC) 5 MG tablet Take 5 mg by mouth daily.    Yes [provider]  apixaban (ELIQUIS) 5 MG TABS tablet Take 5 mg by mouth 2 (two) times daily.   Yes [provider]  aspirin EC  81 MG tablet Take 81 mg by mouth daily. OTC   Yes [provider]  atorvastatin (LIPITOR) 40 MG tablet Take 40 mg by mouth at bedtime.    Yes [provider]  Biotin 1000 MCG tablet Take 1,000 mcg by mouth daily.   Yes [provider]  buPROPion (WELLBUTRIN XL) 300 MG 24 hr tablet Take 300 mg by mouth daily. 04/26/20  Yes [provider]  digoxin (LANOXIN) 0.125 MG tablet Take 0.125 mg by mouth daily. Chronic A-fib    Yes [provider]  escitalopram (LEXAPRO) 10 MG tablet Take 10 mg by mouth daily. 01/18/19  Yes [provider]  furosemide (LASIX) 40 MG tablet Take 40 mg by mouth daily.    Yes [provider]  hydrocortisone 2.5 % cream Apply 1 application topically 2 (two) times daily as needed  (rash/itching).  01/03/20  Yes [provider]  metoprolol tartrate (LOPRESSOR) 100 MG tablet Take 100 mg by mouth 2 (two) times daily.   Yes [provider]  ondansetron (ZOFRAN) 4 MG tablet Take 1 tablet (4 mg total) by mouth every 8 (eight) hours as needed for nausea or vomiting. 05/08/20  Yes Montez Morita, Daniel, MD  pantoprazole (PROTONIX) 40 MG tablet Take 40 mg by mouth daily. 04/26/20  Yes [provider]  potassium chloride SA (K-DUR,KLOR-CON) 20 MEQ tablet Take 20 mEq by mouth daily.  10/01/17  Yes [provider]  predniSONE (DELTASONE) 5 MG tablet Take 5 mg by mouth daily.    Yes [provider]  Vitamin D, Ergocalciferol, (DRISDOL) 1.25 MG (50000 UNIT) CAPS capsule Take 50,000 Units by mouth every Sunday. 03/12/20  Yes [provider]    Physical Exam: Vitals:   05/13/20 0200 05/13/20 0215 05/13/20 0230 05/13/20 0245  BP: (!) 160/77  (!) 176/57   Pulse: 66 63 60 (!) 59  Resp: (!) 21 12 13 16   Temp:      TempSrc:      SpO2: 98% 99% 99% 97%  Weight:      Height:        Constitutional: NAD, calm, comfortable Eyes: PERRL, lids and conjunctivae normal ENMT: Mucous membranes are moist. Posterior pharynx clear of any exudate or lesions.Normal dentition. Very hard of hearing. Neck: normal, supple, no masses, no thyromegaly Respiratory: clear to auscultation bilaterally, no wheezing, no crackles. Normal respiratory effort. No accessory muscle use.  Cardiovascular: IRR, IRR Abdomen: no tenderness, no masses palpated. No hepatosplenomegaly. Bowel sounds positive.  Musculoskeletal: no clubbing / cyanosis. No joint deformity upper and lower extremities. Good ROM, no contractures. Normal muscle tone.  Skin: no rashes, lesions, ulcers. No induration Neurologic: CN 2-12 grossly intact. Sensation intact, DTR normal. Strength 5/5 in all 4.  Psychiatric: Normal judgment and insight. Alert and oriented x 3. Normal mood.    Labs on  Admission: I have personally reviewed following labs and imaging studies  CBC: Recent Labs  Lab 05/12/20 1310  WBC 10.7*  HGB 11.5*  HCT 37.9  MCV 94.0  PLT 283   Basic Metabolic Panel: Recent Labs  Lab 05/12/20 1310  NA 136  K 3.7  CL 103  CO2 20*  GLUCOSE 138*  BUN 10  CREATININE 1.25*  CALCIUM 9.3   GFR: Estimated Creatinine Clearance: 24.6 mL/min (A) (by C-G formula based on SCr of 1.25 mg/dL (H)). Liver Function Tests: Recent Labs  Lab 05/12/20 1310  AST 30  ALT 25  ALKPHOS 50  BILITOT 1.0  PROT 7.2  ALBUMIN 3.8   No results for input(s): LIPASE, AMYLASE in the last 168 hours. No results for input(s): AMMONIA in the last 168 hours. Coagulation Profile: Recent Labs  Lab 05/13/20 0011  INR 1.5*   Cardiac Enzymes: No results for input(s): CKTOTAL, CKMB, CKMBINDEX, TROPONINI in the last 168 hours. BNP (last 3 results) No results for input(s): PROBNP in the last 8760 hours. HbA1C: No results for input(s): HGBA1C in the last 72 hours. CBG: No results for input(s): GLUCAP in the last 168 hours. Lipid Profile: No results for input(s): CHOL, HDL, LDLCALC, TRIG, CHOLHDL, LDLDIRECT in the last 72 hours. Thyroid Function Tests: No results for input(s): TSH, T4TOTAL, FREET4, T3FREE, THYROIDAB in the last 72 hours. Anemia Panel: No results for input(s): VITAMINB12, FOLATE, FERRITIN, TIBC, IRON, RETICCTPCT in the last 72 hours. Urine analysis:    Component Value Date/Time   COLORURINE YELLOW 05/13/2020 0011   APPEARANCEUR CLOUDY (A) 05/13/2020 0011   APPEARANCEUR Clear 10/28/2017 1422   LABSPEC 1.021 05/13/2020 0011   PHURINE 6.0 05/13/2020 0011   GLUCOSEU NEGATIVE 05/13/2020 0011   HGBUR SMALL (A) 05/13/2020 0011   BILIRUBINUR NEGATIVE 05/13/2020 0011   BILIRUBINUR Negative 10/28/2017 1422   KETONESUR NEGATIVE 05/13/2020 0011   PROTEINUR NEGATIVE 05/13/2020 0011   UROBILINOGEN 0.2 12/23/2011 2034   NITRITE NEGATIVE 05/13/2020 0011   LEUKOCYTESUR  LARGE (A) 05/13/2020 0011    Radiological Exams on Admission: CT ABDOMEN PELVIS W CONTRAST  Result Date: 05/13/2020 CLINICAL DATA:  Bright red rectal bleeding since yesterday. EXAM: CT ABDOMEN AND PELVIS WITH CONTRAST TECHNIQUE: Multidetector CT imaging of the abdomen and pelvis was performed using the standard protocol following bolus administration of intravenous contrast. CONTRAST:  74mL OMNIPAQUE IOHEXOL 300 MG/ML  SOLN COMPARISON:  None. FINDINGS: Lower chest: Atelectasis in the left lung base. Cardiac enlargement. Small pericardial effusion. Hepatobiliary: Scattered hepatic parenchymal cysts. Largest measures about 1.8 cm diameter. Gallbladder and bile ducts are normal. Pancreas: Unremarkable. No pancreatic ductal dilatation or surrounding inflammatory changes. Spleen: Normal in size without focal abnormality. Adrenals/Urinary Tract: No adrenal gland nodules. Multiple cysts in both kidneys. Bilateral renal parenchymal scarring. Nephrograms are otherwise homogeneous. No hydronephrosis or hydroureter. Bladder is unremarkable. Stomach/Bowel: Stomach, small bowel, and colon are mostly decompressed. Sigmoid colonic diverticulosis. No evidence of diverticulitis. No obstructing lesions identified. Appendix is not identified. Vascular/Lymphatic: Aortic atherosclerosis. No enlarged abdominal or pelvic lymph nodes. Reproductive: Status post hysterectomy. No adnexal masses. Other: No free air or free fluid in the abdomen. Abdominal wall musculature appears intact. Musculoskeletal: Degenerative changes in the spine. Tarlov cysts in the sacrum. No destructive bone lesions. IMPRESSION: 1. No acute process demonstrated in the abdomen or pelvis. No evidence of bowel obstruction or inflammation. Sigmoid colonic diverticulosis without evidence of diverticulitis. 2. Cardiac enlargement with small pericardial effusion. 3. Multiple hepatic and renal cysts. Bilateral renal parenchymal scarring. 4. Aortic atherosclerosis.  Aortic Atherosclerosis (ICD10-I70.0). Electronically Signed   By: Lucienne Capers M.D.   On: 05/13/2020 00:53    EKG: Independently reviewed.  Assessment/Plan Principal Problem:   BRBPR (bright red blood per rectum) Active Problems:   Essential hypertension, benign   Chronic atrial fibrillation (HCC)   Polymyalgia rheumatica (HCC)   Nonrheumatic aortic valve stenosis   Melena   Chronic diastolic CHF (congestive heart failure) (HCC)   Pyuria    1. BRBPR - 1. NPO 2. Sent message to Dr. Benson Norway for GI consult in AM 3. Repeat CBC in AM 4. Tele monitor 5. BP currently [id number] systolic, will hold  off on IVF for the moment 2. ? Intermittent melena - 1. Again, consult Dr. Benson Norway in AM 2. Cont home PPI 3. A.Fib - 1. Hold eliquis 2. Cont metoprolol 3. Cont digoxin 4. HTN - 1. Cont metoprolol 2. Cont Norvasc 5. Chronic diastolic CHF - 1. Holding lasix while pt NPO 6. AVS - 1. Holding off on IVF for the moment 2. Plan currently is for TAVR next month it sounds like 7. Pyuria - 1. Urine culture ordered 8. PMR - 1. Cont prednisone  DVT prophylaxis: SCDs Code Status: Full Family Communication: No family in room Disposition Plan: Home after GIB stopped Consults called: Message sent to Dr. Benson Norway for AM consult Admission status: Place in obs   Mckynlee Luse, [place] [facility]  How to contact the [facility] Attending or Consulting provider [place] or covering provider during after hours [place], for this patient?  1. Check the care team in [facility] and look for a) attending/consulting [facility] provider listed and b) the [facility] team listed 2. Log into [email or web address]  Amion Physician Scheduling and messaging for groups and whole hospitals  On call and physician scheduling software for group practices, residents, hospitalists and other medical providers for call, clinic, rotation and shift schedules. OnCall [employer] is a hospital-wide system for scheduling doctors and paging doctors on call.  EasyPlot is for scientific plotting and data analysis.  [email or web address]  and use [place]'s universal password to access. If you do not have the password, please contact the hospital operator.  3. Locate the [facility] provider you are looking for under [facility] and page to a number that you can be directly reached. 4. If you still have difficulty reaching the provider, please page the [facility] (Director on Call) for the Hospitalists listed on amion for assistance.  05/13/2020, 2:55 AM

## 2020-05-13 NOTE — Consult Note (Signed)
Reason for Consult: Hematochezia Referring Physician: [facility]  [name] [name] HPI: This is an 86 year old female with a PMH of critical AS awaiting TAVR,. afib on Eliquis, HTN, history of CVA, and PMR admitted with complaints of hematochezia.  She recalls having hematochezia for the past couple of days.  She denies any issues with abdominal pain, nausea, or vomiting.  On 9/20./2021 she was evaluated by GI in [place] and there was the thought of performing an EGD, but the critical AS was an issue.  Per her report she had a colonoscopy last July, but no records are available for review.  There is documentation of a colonoscopy in 2008.  Her CT scan on 9.25/2021 was negative for any acute abnormalities and she does have diverticula.  Past Medical History:  Diagnosis Date  . Atrial fibrillation ([place])    On Coumadin  . Coronary atherosclerosis of native coronary artery    Nonobstructive at cardiac catheterization 2004  . Depression   . Dysphagia   . Essential hypertension, benign   . Hyperlipidemia   . Polymyalgia rheumatica ([place])   . Stroke [facility])     Past Surgical History:  Procedure Laterality Date  . APPENDECTOMY    . COLONOSCOPY  2008   Dr.Fleshiman  . Left ankle    . TOE SURGERY    . TOTAL ABDOMINAL HYSTERECTOMY      Family History  Problem Relation Age of Onset  . Stroke Mother   . Hypertension Sister   . Hypertension Brother   . Rheum arthritis Daughter   . Healthy Son   . Healthy Son   . Healthy Son   . Hypertension Sister   . Heart disease Sister   . Heart disease Brother   . Hypertension Brother   . Arthritis Brother   . Colon cancer Brother   . Hypertension Brother   . Coronary artery disease Other     Social History:  reports that she has never smoked. She has never used smokeless tobacco. She reports that she does not drink alcohol and does not use drugs.  Allergies:  Allergies  Allergen Reactions  . Sulfa Antibiotics Rash  . Sulfonamide  Derivatives Rash    Medications:  Scheduled: . amLODipine  5 mg Oral Daily  . atorvastatin  40 mg Oral QHS  . buPROPion  300 mg Oral Daily  . digoxin  0.125 mg Oral Daily  . escitalopram  10 mg Oral Daily  . metoprolol tartrate  100 mg Oral BID  . pantoprazole  40 mg Oral Daily  . predniSONE  5 mg Oral Daily  . [START ON 05/14/2020] Vitamin D (Ergocalciferol)  50,000 Units Oral Q Sun   Continuous: . sodium chloride 30 mL/hr at 05/13/20 1220    Results for orders placed or performed during the hospital encounter of 05/12/20 (from the past 24 hour(s))  Respiratory Panel by RT PCR (Flu A&B, Covid) - Nasopharyngeal Swab     Status: None   Collection Time: 05/12/20 11:50 PM   Specimen: Nasopharyngeal Swab  Result Value Ref Range   SARS Coronavirus 2 by RT PCR NEGATIVE NEGATIVE   Influenza A by PCR NEGATIVE NEGATIVE   Influenza B by PCR NEGATIVE NEGATIVE  Protime-INR     Status: Abnormal   Collection Time: 05/13/20 12:11 AM  Result Value Ref Range   Prothrombin Time 17.8 (H) 11.4 - 15.2 seconds   INR 1.5 (H) 0.8 - 1.2  Troponin I (High Sensitivity)  Status: Abnormal   Collection Time: 05/13/20 12:11 AM  Result Value Ref Range   Troponin I (High Sensitivity) 27 (H) <18 ng/L  Lactic acid, plasma     Status: Abnormal   Collection Time: 05/13/20 12:11 AM  Result Value Ref Range   Lactic Acid, Venous 3.0 (HH) 0.5 - 1.9 mmol/L  Urinalysis, Routine w reflex microscopic Urine, Clean Catch     Status: Abnormal   Collection Time: 05/13/20 12:11 AM  Result Value Ref Range   Color, Urine YELLOW YELLOW   APPearance CLOUDY (A) CLEAR   Specific Gravity, Urine 1.021 1.005 - 1.030   pH 6.0 5.0 - 8.0   Glucose, UA NEGATIVE NEGATIVE mg/dL   Hgb urine dipstick SMALL (A) NEGATIVE   Bilirubin Urine NEGATIVE NEGATIVE   Ketones, ur NEGATIVE NEGATIVE mg/dL   Protein, ur NEGATIVE NEGATIVE mg/dL   Nitrite NEGATIVE NEGATIVE   Leukocytes,Ua LARGE (A) NEGATIVE   RBC / HPF 11-20 0 - 5 RBC/hpf    WBC, UA >50 (H) 0 - 5 WBC/hpf   Bacteria, UA NONE SEEN NONE SEEN   Squamous Epithelial / LPF 0-5 0 - 5   WBC Clumps PRESENT    Mucus PRESENT    Non Squamous Epithelial 0-5 (A) NONE SEEN  Troponin I (High Sensitivity)     Status: Abnormal   Collection Time: 05/13/20  2:05 AM  Result Value Ref Range   Troponin I (High Sensitivity) 26 (H) <18 ng/L  CBC     Status: Abnormal   Collection Time: 05/13/20  5:51 AM  Result Value Ref Range   WBC 7.8 4.0 - 10.5 K/uL   RBC 3.48 (L) 3.87 - 5.11 MIL/uL   Hemoglobin 10.0 (L) 12.0 - 15.0 g/dL   HCT 31.8 (L) 36 - 46 %   MCV 91.4 80.0 - 100.0 fL   MCH 28.7 26.0 - 34.0 pg   MCHC 31.4 30.0 - 36.0 g/dL   RDW 14.6 11.5 - 15.5 %   Platelets 281 150 - 400 K/uL   nRBC 0.0 0.0 - 0.2 %  Basic metabolic panel     Status: Abnormal   Collection Time: 05/13/20  5:51 AM  Result Value Ref Range   Sodium 139 135 - 145 mmol/L   Potassium 3.4 (L) 3.5 - 5.1 mmol/L   Chloride 104 98 - 111 mmol/L   CO2 26 22 - 32 mmol/L   Glucose, Bld 100 (H) 70 - 99 mg/dL   BUN 12 8 - 23 mg/dL   Creatinine, Ser 1.16 (H) 0.44 - 1.00 mg/dL   Calcium 9.0 8.9 - 10.3 mg/dL   GFR calc non Af Amer 42 (L) >60 mL/min   GFR calc Af Amer 49 (L) >60 mL/min   Anion gap 9 5 - 15  Digoxin level     Status: None   Collection Time: 05/13/20  5:51 AM  Result Value Ref Range   Digoxin Level 1.6 1.0 - 2.0 ng/mL  Hemoglobin and hematocrit, blood     Status: Abnormal   Collection Time: 05/13/20 11:50 AM  Result Value Ref Range   Hemoglobin 9.9 (L) 12.0 - 15.0 g/dL   HCT 31.4 (L) 36 - 46 %     CT ABDOMEN PELVIS W CONTRAST  Result Date: 05/13/2020 CLINICAL DATA:  Bright red rectal bleeding since yesterday. EXAM: CT ABDOMEN AND PELVIS WITH CONTRAST TECHNIQUE: Multidetector CT imaging of the abdomen and pelvis was performed using the standard protocol following bolus administration of intravenous contrast. CONTRAST:  56mL OMNIPAQUE IOHEXOL 300 MG/ML  SOLN COMPARISON:  None. FINDINGS: Lower chest:  Atelectasis in the left lung base. Cardiac enlargement. Small pericardial effusion. Hepatobiliary: Scattered hepatic parenchymal cysts. Largest measures about 1.8 cm diameter. Gallbladder and bile ducts are normal. Pancreas: Unremarkable. No pancreatic ductal dilatation or surrounding inflammatory changes. Spleen: Normal in size without focal abnormality. Adrenals/Urinary Tract: No adrenal gland nodules. Multiple cysts in both kidneys. Bilateral renal parenchymal scarring. Nephrograms are otherwise homogeneous. No hydronephrosis or hydroureter. Bladder is unremarkable. Stomach/Bowel: Stomach, small bowel, and colon are mostly decompressed. Sigmoid colonic diverticulosis. No evidence of diverticulitis. No obstructing lesions identified. Appendix is not identified. Vascular/Lymphatic: Aortic atherosclerosis. No enlarged abdominal or pelvic lymph nodes. Reproductive: Status post hysterectomy. No adnexal masses. Other: No free air or free fluid in the abdomen. Abdominal wall musculature appears intact. Musculoskeletal: Degenerative changes in the spine. Tarlov cysts in the sacrum. No destructive bone lesions. IMPRESSION: 1. No acute process demonstrated in the abdomen or pelvis. No evidence of bowel obstruction or inflammation. Sigmoid colonic diverticulosis without evidence of diverticulitis. 2. Cardiac enlargement with small pericardial effusion. 3. Multiple hepatic and renal cysts. Bilateral renal parenchymal scarring. 4. Aortic atherosclerosis. Aortic Atherosclerosis (ICD10-I70.0). Electronically Signed   By: Lucienne Capers M.D.   On: 05/13/2020 00:53    ROS:  As stated above in the HPI otherwise negative.  Blood pressure (!) 148/48, pulse (!) 59, temperature 99 F (37.2 C), temperature source Oral, resp. rate 16, height 5\' 2"  (1.575 m), weight 57.7 kg, SpO2 99 %.    PE: Gen: NAD, Alert and Oriented HEENT:  [place]/AT, EOMI Neck: Supple, no LAD Lungs: CTA Bilaterally CV: RRR without M/G/R ABD: Soft, NTND,  +BS Ext: No C/C/E  Assessment/Plan: 1) Hematochezia. 2) Diverticula. 3) Critical AS. 4) Anemia.   The patient was offered a colonoscopy, but she refuses.  She does not want any procedures.  It would be in her best interest to investigate, but she is not interested.  Her HGB did drop with IV hydration from 11.5 g/dL down to 9.9 g./dL.  Her HGB in August was 13.5 g/dL.  Since she does not want an procedures no further evaluation will be pursued.  Plan: 1) Supportive care. 2) Signing off.  Call if the patient changes her mind.  Tyyne Cliett D 05/13/2020, 3:41 PM

## 2020-05-13 NOTE — ED Notes (Signed)
Attempted to call report

## 2020-05-14 DIAGNOSIS — K625 Hemorrhage of anus and rectum: Secondary | ICD-10-CM | POA: Diagnosis not present

## 2020-05-14 LAB — HEMOGLOBIN AND HEMATOCRIT, BLOOD
HCT: 30.8 % — ABNORMAL LOW (ref 36.0–46.0)
Hemoglobin: 9.7 g/dL — ABNORMAL LOW (ref 12.0–15.0)

## 2020-05-14 LAB — URINE CULTURE: Culture: >=100000 — AB

## 2020-05-14 MED ORDER — APIXABAN 2.5 MG PO TABS: 2.5000 mg | ORAL_TABLET | Freq: Two times a day (BID) | ORAL | 0 refills | Status: AC

## 2020-05-14 NOTE — Care Management (Signed)
Patient given 30 day free Eliquis coupon.

## 2020-05-14 NOTE — Discharge Summary (Signed)
Physician Discharge Summary  [name] [name] FYB:[id number] DOB: 05-Feb-1930 DOA: 05/12/2020  PCP: Caryl Bis, MD  Admit date: 05/12/2020 Discharge date: 05/14/2020  Admitted From: home Disposition:  home  Recommendations for Outpatient Follow-up:  1. Follow up with PCP in 1-2 weeks 2. Please obtain BMP/CBC in one week 3. Please follow up on the following pending results:  Home Health:no  Equipment/Devices: none  Discharge Condition: Stable Code Status:   Code Status: Full Code Diet recommendation:  Diet Order            DIET SOFT Room service appropriate? Yes; Fluid consistency: Thin  Diet effective now                 Brief/Interim Summary: 86 year old female with history of A. fib on Eliquis prior history of hypertension, PMR on chronic prednisone, severe AS with plans for TAVR next month who has been having chronic diarrhea intermittent episodes of abdominal pain black tarry stools which has been more recently. She was seen in. Seen by GI on 9/20 consider EGD however wanted patient to get her critical AAS addressed next month so deferred for the moment, hemoglobin has been stable at 13.5 g and Hemoccult negative in early August, with history of hemorrhoids. She presented to the ED with 2 days history of persistent leakage of bright red blood per rectum. Seen in the ED, hemoglobin is 11.5 g GI was consulted and admitted for further work-up. GI for colonoscopy to investigate further given history of diverticulum.  Patient reported she had colonoscopy that showed hemorrhoids and diverticulum endoscopy last July. Eliquis was held on admission.  Hemoglobin slightly down trended but has been up in 9.7 g.  No recurrence of bleeding.  At this time patient wishes to go home does not want to have any colonoscopy and would like to follow-up with her GI doctor at 3 weeks..  I called the daughter patient also discussed with daughter and they would like to go home and follow-up with GI doctor  tomorrow.  I did instruct her to cut down the Eliquis dose to 2/5 mg bid based upon her weight less than 60 kg and is more than 80.  Patient is aware about risk of bleeding while on Eliquis but is on it to prevent stroke and is worried about getting a stroke if she does not take her blood thinner. If she has any recurrence of bleeding she is advised to seek immediate medical attention and hold her Eliquis  Discharge Diagnoses:  BRBPR/? Intermittent melena/chronic diarrhea/history of hemorrhoids: npo, gi on consult-avoid colonoscopy patient refused, at this point there would like to go home.  H&H more or less is stable no recurrence of bleeding here.  See above.  I have discussed with Dr. Benson Norway this morning for further recommendation and she is going home on Eliquis with patient understanding risk of bleeding. A. fib on Eliquis on metoprolol and digoxin. Eliquis dose appropriately decreased to 2.5 mg twice daily based upon her weight and age. Severe aortic stenosis with plan for TAVR next month. She says it is scheduled for Oct the 10th at [facility] Hypertension on metoprolol, Norvasc BP poorly controlled added as needed hydralazine p.o. Pyuria urine culture was pending.WBC count improved,afebrile.  Do not suspect UTI. PMR on chronic prednisone Chronidiastolic CHF holding Lasix as NPO Mild hypokalemia replacement ordered  Consults:  GI  Subjective: Alert awake.  No recurrence of bleeding.  She was on the phone with her daughter and daughter has  advised her that they will follow-up with her GI doctor in [place] for further colonoscopy and at this time family would like to go home.  Plan for colonoscopy GI has signed off.  Discharge Exam: Vitals:   05/14/20 0400 05/14/20 1053  BP: 140/62   Pulse: 68 60  Resp: 18   Temp: 97.9 F (36.6 C)   SpO2: 99%    General: Pt is alert, awake, not in acute distress Cardiovascular: RRR, S1/S2 +, no rubs, no gallops Respiratory: CTA bilaterally, no  wheezing, no rhonchi Abdominal: Soft, NT, ND, bowel sounds + Extremities: no edema, no cyanosis  Discharge Instructions  Discharge Instructions    Discharge instructions   Complete by: As directed    Please call call MD or return to ER for similar or worsening recurring problem that brought you to hospital or if any fever,nausea/vomiting,abdominal pain, uncontrolled pain, chest pain,  shortness of breath or any other alarming symptoms.  Please follow-up your GI doctor tomorrow regarding for COLONOSCOPY. Seek immediate medical attention incase of any bleeding.   Please avoid alcohol, smoking, or any other illicit substance and maintain healthy habits including taking your regular medications as prescribed.  You were cared for by a hospitalist during your hospital stay. If you have any questions about your discharge medications or the care you received while you were in the hospital after you are discharged, you can call the unit and ask to speak with the hospitalist on call if the hospitalist that took care of you is not available.  Once you are discharged, your primary care physician will handle any further medical issues. Please note that NO REFILLS for any discharge medications will be authorized once you are discharged, as it is imperative that you return to your primary care physician (or establish a relationship with a primary care physician if you do not have one) for your aftercare needs so that they can reassess your need for medications and monitor your lab values   Increase activity slowly   Complete by: As directed      Allergies as of 05/14/2020      Reactions   Sulfa Antibiotics Rash   Sulfonamide Derivatives Rash      Medication List    TAKE these medications   acetaminophen 500 MG tablet Commonly known as: TYLENOL Take 500-1,000 mg by mouth daily as needed for moderate pain.   alendronate 70 MG tablet Commonly known as: FOSAMAX Take 70 mg by mouth every Sunday.    ALPRAZolam 0.25 MG tablet Commonly known as: XANAX Take 0.25 mg by mouth every 8 (eight) hours as needed (panic attacks).   amLODipine 5 MG tablet Commonly known as: NORVASC Take 5 mg by mouth daily.   apixaban 2.5 MG Tabs tablet Commonly known as: Eliquis Take 1 tablet (2.5 mg total) by mouth 2 (two) times daily. What changed:   medication strength  how much to take   aspirin EC 81 MG tablet Take 81 mg by mouth daily. OTC   atorvastatin 40 MG tablet Commonly known as: LIPITOR Take 40 mg by mouth at bedtime.   Biotin 1000 MCG tablet Take 1,000 mcg by mouth daily.   buPROPion 300 MG 24 hr tablet Commonly known as: WELLBUTRIN XL Take 300 mg by mouth daily.   digoxin 0.125 MG tablet Commonly known as: LANOXIN Take 0.125 mg by mouth daily. Chronic A-fib   escitalopram 10 MG tablet Commonly known as: LEXAPRO Take 10 mg by mouth daily.   furosemide 40  MG tablet Commonly known as: LASIX Take 40 mg by mouth daily.   hydrocortisone 2.5 % cream Apply 1 application topically 2 (two) times daily as needed (rash/itching).   metoprolol tartrate 100 MG tablet Commonly known as: LOPRESSOR Take 100 mg by mouth 2 (two) times daily.   ondansetron 4 MG tablet Commonly known as: ZOFRAN Take 1 tablet (4 mg total) by mouth every 8 (eight) hours as needed for nausea or vomiting.   pantoprazole 40 MG tablet Commonly known as: PROTONIX Take 40 mg by mouth daily.   potassium chloride SA 20 MEQ tablet Commonly known as: KLOR-CON Take 20 mEq by mouth daily.   predniSONE 5 MG tablet Commonly known as: DELTASONE Take 5 mg by mouth daily.   Vitamin D (Ergocalciferol) 1.25 MG (50000 UNIT) Caps capsule Commonly known as: DRISDOL Take 50,000 Units by mouth every Sunday.       Follow-up Information    Harvel Quale, MD. Call in 1 day(s).   Specialty: Gastroenterology Contact information: [address] [address] [phone]         Caryl Bis, MD Follow up in 1 week(s).   Specialty: Family Medicine Why: CBC check Contact information: [place] [address] [phone]        Satira Sark, MD .   Specialty: Cardiology Contact information: [place] [address] [phone]              Allergies  Allergen Reactions  . Sulfa Antibiotics Rash  . Sulfonamide Derivatives Rash    The results of significant diagnostics from this hospitalization (including imaging, microbiology, ancillary and laboratory) are listed below for reference.    Microbiology: Recent Results (from the past 240 hour(s))  Respiratory Panel by RT PCR (Flu A&B, Covid) - Nasopharyngeal Swab     Status: None   Collection Time: 05/12/20 11:50 PM   Specimen: Nasopharyngeal Swab  Result Value Ref Range Status   SARS Coronavirus 2 by RT PCR NEGATIVE NEGATIVE Final    Comment: (NOTE) SARS-CoV-2 target nucleic acids are NOT DETECTED.  The SARS-CoV-2 RNA is generally detectable in upper respiratoy specimens during the acute phase of infection. The lowest concentration of SARS-CoV-2 viral copies this assay can detect is 131 copies/mL. A negative result does not preclude SARS-Cov-2 infection and should not be used as the sole basis for treatment or other patient management decisions. A negative result may occur with  improper specimen collection/handling, submission of specimen other than nasopharyngeal swab, presence of viral mutation(s) within the areas targeted by this assay, and inadequate number of viral copies (<131 copies/mL). A negative result must be combined with clinical observations, patient history, and epidemiological information. The expected result is Negative.  Fact Sheet for Patients:  PinkCheek.be  Fact Sheet for Healthcare Providers:  GravelBags.it  This test is no t yet approved or cleared by the [place] FDA  and  has been authorized for detection and/or diagnosis of SARS-CoV-2 by FDA under an Emergency Use Authorization (EUA). This EUA will remain  in effect (meaning this test can be used) for the duration of the COVID-19 declaration under Section 564(b)(1) of the Act, 21 U.S.C. section 360bbb-3(b)(1), unless the authorization is terminated or revoked sooner.     Influenza A by PCR NEGATIVE NEGATIVE Final   Influenza B by PCR NEGATIVE NEGATIVE Final    Comment: (NOTE) The Xpert Xpress SARS-CoV-2/FLU/RSV assay is intended as an aid in  the diagnosis of influenza from Nasopharyngeal swab specimens and  should not be used as a sole basis for treatment. Nasal washings and  aspirates are unacceptable for Xpert Xpress SARS-CoV-2/FLU/RSV  testing.  Fact Sheet for Patients: PinkCheek.be  Fact Sheet for Healthcare Providers: GravelBags.it  This test is not yet approved or cleared by the [place] FDA and  has been authorized for detection and/or diagnosis of SARS-CoV-2 by  FDA under an Emergency Use Authorization (EUA). This EUA will remain  in effect (meaning this test can be used) for the duration of the  Covid-19 declaration under Section 564(b)(1) of the Act, 21  U.S.C. section 360bbb-3(b)(1), unless the authorization is  terminated or revoked. Performed at [facility], [place] [address] [place], [place] [address]   Culture, Urine     Status: None (Preliminary result)   Collection Time: 05/13/20  1:00 AM   Specimen: Urine, Clean Catch  Result Value Ref Range Status   Specimen Description URINE, CLEAN CATCH  Final   Special Requests NONE  Final   Culture   Final    CULTURE REINCUBATED FOR BETTER GROWTH Performed at [facility], [place] [address] [place], [place] [address]    Report Status PENDING  Incomplete    Procedures/Studies: CT ABDOMEN PELVIS W CONTRAST  Result Date: 05/13/2020 CLINICAL DATA:  Bright  red rectal bleeding since yesterday. EXAM: CT ABDOMEN AND PELVIS WITH CONTRAST TECHNIQUE: Multidetector CT imaging of the abdomen and pelvis was performed using the standard protocol following bolus administration of intravenous contrast. CONTRAST:  52mL OMNIPAQUE IOHEXOL 300 MG/ML  SOLN COMPARISON:  None. FINDINGS: Lower chest: Atelectasis in the left lung base. Cardiac enlargement. Small pericardial effusion. Hepatobiliary: Scattered hepatic parenchymal cysts. Largest measures about 1.8 cm diameter. Gallbladder and bile ducts are normal. Pancreas: Unremarkable. No pancreatic ductal dilatation or surrounding inflammatory changes. Spleen: Normal in size without focal abnormality. Adrenals/Urinary Tract: No adrenal gland nodules. Multiple cysts in both kidneys. Bilateral renal parenchymal scarring. Nephrograms are otherwise homogeneous. No hydronephrosis or hydroureter. Bladder is unremarkable. Stomach/Bowel: Stomach, small bowel, and colon are mostly decompressed. Sigmoid colonic diverticulosis. No evidence of diverticulitis. No obstructing lesions identified. Appendix is not identified. Vascular/Lymphatic: Aortic atherosclerosis. No enlarged abdominal or pelvic lymph nodes. Reproductive: Status post hysterectomy. No adnexal masses. Other: No free air or free fluid in the abdomen. Abdominal wall musculature appears intact. Musculoskeletal: Degenerative changes in the spine. Tarlov cysts in the sacrum. No destructive bone lesions. IMPRESSION: 1. No acute process demonstrated in the abdomen or pelvis. No evidence of bowel obstruction or inflammation. Sigmoid colonic diverticulosis without evidence of diverticulitis. 2. Cardiac enlargement with small pericardial effusion. 3. Multiple hepatic and renal cysts. Bilateral renal parenchymal scarring. 4. Aortic atherosclerosis. Aortic Atherosclerosis (ICD10-I70.0). Electronically Signed   By: Lucienne Capers M.D.   On: 05/13/2020 00:53    Labs: BNP (last 3 results) No  results for input(s): BNP in the last 8760 hours. Basic Metabolic Panel: Recent Labs  Lab 05/12/20 1310 05/13/20 0551  NA 136 139  K 3.7 3.4*  CL 103 104  CO2 20* 26  GLUCOSE 138* 100*  BUN 10 12  CREATININE 1.25* 1.16*  CALCIUM 9.3 9.0   Liver Function Tests: Recent Labs  Lab 05/12/20 1310  AST 30  ALT 25  ALKPHOS 50  BILITOT 1.0  PROT 7.2  ALBUMIN 3.8   No results for input(s): LIPASE, AMYLASE in the last 168 hours. No results for input(s): AMMONIA in the last 168 hours.  CBC: Recent Labs  Lab 05/12/20 1310 05/13/20 0551 05/13/20 1150 05/13/20 1939 05/14/20 0605  WBC 10.7* 7.8  --   --   --   HGB 11.5* 10.0* 9.9* 9.4* 9.7*  HCT 37.9 31.8* 31.4* 30.1* 30.8*  MCV 94.0 91.4  --   --   --   PLT 333 281  --   --   --    Cardiac Enzymes: No results for input(s): CKTOTAL, CKMB, CKMBINDEX, TROPONINI in the last 168 hours. BNP: Invalid input(s): POCBNP CBG: No results for input(s): GLUCAP in the last 168 hours. D-Dimer No results for input(s): DDIMER in the last 72 hours. Hgb A1c No results for input(s): HGBA1C in the last 72 hours. Lipid Profile No results for input(s): CHOL, HDL, LDLCALC, TRIG, CHOLHDL, LDLDIRECT in the last 72 hours. Thyroid function studies No results for input(s): TSH, T4TOTAL, T3FREE, THYROIDAB in the last 72 hours.  Invalid input(s): FREET3 Anemia work up No results for input(s): VITAMINB12, FOLATE, FERRITIN, TIBC, IRON, RETICCTPCT in the last 72 hours. Urinalysis    Component Value Date/Time   COLORURINE YELLOW 05/13/2020 0011   APPEARANCEUR CLOUDY (A) 05/13/2020 0011   APPEARANCEUR Clear 10/28/2017 1422   LABSPEC 1.021 05/13/2020 0011   PHURINE 6.0 05/13/2020 0011   GLUCOSEU NEGATIVE 05/13/2020 0011   HGBUR SMALL (A) 05/13/2020 0011   BILIRUBINUR NEGATIVE 05/13/2020 0011   BILIRUBINUR Negative 10/28/2017 1422   KETONESUR NEGATIVE 05/13/2020 0011   PROTEINUR NEGATIVE 05/13/2020 0011   UROBILINOGEN 0.2 12/23/2011 2034    NITRITE NEGATIVE 05/13/2020 0011   LEUKOCYTESUR LARGE (A) 05/13/2020 0011   Sepsis Labs Invalid input(s): PROCALCITONIN,  WBC,  LACTICIDVEN Microbiology Recent Results (from the past 240 hour(s))  Respiratory Panel by RT PCR (Flu A&B, Covid) - Nasopharyngeal Swab     Status: None   Collection Time: 05/12/20 11:50 PM   Specimen: Nasopharyngeal Swab  Result Value Ref Range Status   SARS Coronavirus 2 by RT PCR NEGATIVE NEGATIVE Final    Comment: (NOTE) SARS-CoV-2 target nucleic acids are NOT DETECTED.  The SARS-CoV-2 RNA is generally detectable in upper respiratoy specimens during the acute phase of infection. The lowest concentration of SARS-CoV-2 viral copies this assay can detect is 131 copies/mL. A negative result does not preclude SARS-Cov-2 infection and should not be used as the sole basis for treatment or other patient management decisions. A negative result may occur with  improper specimen collection/handling, submission of specimen other than nasopharyngeal swab, presence of viral mutation(s) within the areas targeted by this assay, and inadequate number of viral copies (<131 copies/mL). A negative result must be combined with clinical observations, patient history, and epidemiological information. The expected result is Negative.  Fact Sheet for Patients:  PinkCheek.be  Fact Sheet for Healthcare Providers:  GravelBags.it  This test is no t yet approved or cleared by the [place] FDA and  has been authorized for detection and/or diagnosis of SARS-CoV-2 by FDA under an Emergency Use Authorization (EUA). This EUA will remain  in effect (meaning this test can be used) for the duration of the COVID-19 declaration under Section 564(b)(1) of the Act, 21 U.S.C. section 360bbb-3(b)(1), unless the authorization is terminated or revoked sooner.     Influenza A by PCR NEGATIVE NEGATIVE Final   Influenza B by PCR  NEGATIVE NEGATIVE Final    Comment: (NOTE) The Xpert Xpress SARS-CoV-2/FLU/RSV assay is intended as an aid in  the diagnosis of influenza from Nasopharyngeal swab specimens and  should not be used  as a sole basis for treatment. Nasal washings and  aspirates are unacceptable for Xpert Xpress SARS-CoV-2/FLU/RSV  testing.  Fact Sheet for Patients: PinkCheek.be  Fact Sheet for Healthcare Providers: GravelBags.it  This test is not yet approved or cleared by the [place] FDA and  has been authorized for detection and/or diagnosis of SARS-CoV-2 by  FDA under an Emergency Use Authorization (EUA). This EUA will remain  in effect (meaning this test can be used) for the duration of the  Covid-19 declaration under Section 564(b)(1) of the Act, 21  U.S.C. section 360bbb-3(b)(1), unless the authorization is  terminated or revoked. Performed at [facility], [place] [address], [place], [place] [address]   Culture, Urine     Status: None (Preliminary result)   Collection Time: 05/13/20  1:00 AM   Specimen: Urine, Clean Catch  Result Value Ref Range Status   Specimen Description URINE, CLEAN CATCH  Final   Special Requests NONE  Final   Culture   Final    CULTURE REINCUBATED FOR BETTER GROWTH Performed at [facility], [place] [address] [place], [place] [address]    Report Status PENDING  Incomplete     Time coordinating discharge: 25  minutes  SIGNED: Antonieta Pert, MD  [facility] 05/14/2020, 11:43 AM  If 7PM-7AM, please contact night-coverage [email or web address]

## 2020-05-14 NOTE — Plan of Care (Signed)

## 2020-05-16 ENCOUNTER — Ambulatory Visit (HOSPITAL_COMMUNITY): Payer: Medicare Other

## 2020-05-17 ENCOUNTER — Ambulatory Visit (HOSPITAL_COMMUNITY): Payer: Medicare Other

## 2020-05-18 ENCOUNTER — Other Ambulatory Visit: Payer: Medicare Other | Admitting: *Deleted

## 2020-05-18 ENCOUNTER — Other Ambulatory Visit: Payer: Self-pay

## 2020-05-18 DIAGNOSIS — Z515 Encounter for palliative care: Secondary | ICD-10-CM

## 2020-05-18 NOTE — Progress Notes (Signed)
COMMUNITY PALLIATIVE CARE RN NOTE  PATIENT NAME: [name] [name] DOB: [birth year] MRN: [id number]  PRIMARY CARE PROVIDER: Caryl Bis, MD  RESPONSIBLE PARTY:  Acct ID - Guarantor Home Phone Work Phone Relationship Acct Type  [id number] August Albino [phone]  Self P/F     [place], [place], [place] [address]   Due to the COVID-19 crisis, this virtual check-in visit was done via telephone from my office and it was initiated and consent by this patient and or family.  PLAN OF CARE and INTERVENTION:  1. ADVANCE CARE PLANNING/GOALS OF CARE: Goal is for patient to remain at home with her husband.  2. PATIENT/CAREGIVER EDUCATION: Symptom management, safe mobility 3. DISEASE STATUS: Virtual check-in visit completed via telephone with patient. Patient was recently hospitalized from 05/12/20 to 05/14/20 for a lower GI bleed. Patient says she was experiencing abdominal pain, diarrhea and noticing bright red blood from her rectum. Her Eliquis dose was held initially on admission. Before restarted it was decreased to 2.5 mg twice daily. Patient wants to remain on this d/t her Afib and risk for stroke. She has severe aortic stenosis and is scheduled for a TAVR procedure next month at [facility]. She denies any pain at this time. She remains ambulatory using her walker and is independent with her ADLs. She says that she doesn't have much of an appetite, but eats because she knows she needs to, so she does try. She denies dysphagia and is taking her medications without difficulty. Will continue to monitor.  HISTORY OF PRESENT ILLNESS:  This is a 86 yo female with a history of CHF, Afib, aortic valve stenosis, CKD Stage 2, CVA, depression and hyperlipidemia. Palliative care team continues to follow patient and visits monthly and PRN.  CODE STATUS: Full Code  ADVANCED DIRECTIVES: N MOST FORM: no PPS: 50%   (Duration of visit and documentation 30 minutes)   Daryl Eastern, RN BSN

## 2020-05-30 DIAGNOSIS — W19XXXA Unspecified fall, initial encounter: Secondary | ICD-10-CM | POA: Diagnosis not present

## 2020-05-30 DIAGNOSIS — M25519 Pain in unspecified shoulder: Secondary | ICD-10-CM | POA: Diagnosis not present

## 2020-05-30 DIAGNOSIS — R52 Pain, unspecified: Secondary | ICD-10-CM | POA: Diagnosis not present

## 2020-06-01 ENCOUNTER — Other Ambulatory Visit: Payer: Self-pay

## 2020-06-01 ENCOUNTER — Encounter (INDEPENDENT_AMBULATORY_CARE_PROVIDER_SITE_OTHER): Payer: Self-pay | Admitting: Gastroenterology

## 2020-06-01 ENCOUNTER — Ambulatory Visit (INDEPENDENT_AMBULATORY_CARE_PROVIDER_SITE_OTHER): Payer: Medicare Other | Admitting: Gastroenterology

## 2020-06-01 VITALS — BP 119/67 | HR 66 | Temp 98.9°F | Ht 61.5 in | Wt 121.5 lb

## 2020-06-01 DIAGNOSIS — G8194 Hemiplegia, unspecified affecting left nondominant side: Secondary | ICD-10-CM | POA: Diagnosis not present

## 2020-06-01 DIAGNOSIS — D510 Vitamin B12 deficiency anemia due to intrinsic factor deficiency: Secondary | ICD-10-CM | POA: Diagnosis not present

## 2020-06-01 DIAGNOSIS — D520 Dietary folate deficiency anemia: Secondary | ICD-10-CM | POA: Diagnosis not present

## 2020-06-01 DIAGNOSIS — R1030 Lower abdominal pain, unspecified: Secondary | ICD-10-CM | POA: Diagnosis not present

## 2020-06-01 DIAGNOSIS — R110 Nausea: Secondary | ICD-10-CM

## 2020-06-01 DIAGNOSIS — I4891 Unspecified atrial fibrillation: Secondary | ICD-10-CM | POA: Diagnosis not present

## 2020-06-01 DIAGNOSIS — I10 Essential (primary) hypertension: Secondary | ICD-10-CM | POA: Diagnosis not present

## 2020-06-01 DIAGNOSIS — K219 Gastro-esophageal reflux disease without esophagitis: Secondary | ICD-10-CM | POA: Diagnosis not present

## 2020-06-01 DIAGNOSIS — N183 Chronic kidney disease, stage 3 unspecified: Secondary | ICD-10-CM | POA: Diagnosis not present

## 2020-06-01 DIAGNOSIS — E782 Mixed hyperlipidemia: Secondary | ICD-10-CM | POA: Diagnosis not present

## 2020-06-01 DIAGNOSIS — K625 Hemorrhage of anus and rectum: Secondary | ICD-10-CM | POA: Diagnosis not present

## 2020-06-01 DIAGNOSIS — E8881 Metabolic syndrome: Secondary | ICD-10-CM | POA: Diagnosis not present

## 2020-06-01 DIAGNOSIS — Z23 Encounter for immunization: Secondary | ICD-10-CM | POA: Diagnosis not present

## 2020-06-01 DIAGNOSIS — M545 Low back pain, unspecified: Secondary | ICD-10-CM | POA: Diagnosis not present

## 2020-06-01 DIAGNOSIS — I350 Nonrheumatic aortic (valve) stenosis: Secondary | ICD-10-CM | POA: Diagnosis not present

## 2020-06-01 DIAGNOSIS — I482 Chronic atrial fibrillation, unspecified: Secondary | ICD-10-CM | POA: Diagnosis not present

## 2020-06-01 DIAGNOSIS — R109 Unspecified abdominal pain: Secondary | ICD-10-CM | POA: Insufficient documentation

## 2020-06-01 DIAGNOSIS — F331 Major depressive disorder, recurrent, moderate: Secondary | ICD-10-CM | POA: Diagnosis not present

## 2020-06-01 NOTE — Patient Instructions (Signed)
Please send most recent hemoglobin result Follow up with cardiologist regarding aortic valve procedure

## 2020-06-01 NOTE — Progress Notes (Signed)
[name], M.D. Gastroenterology & Hepatology [facility] [address] [place], [place] [address]  Primary Care Physician: [place] [id number]  I will communicate my assessment and recommendations to the referring MD via EMR. "Note: Occasional unusual wording and randomly placed punctuation marks may result from the use of speech recognition technology to transcribe this document"  Problems: 1. Rectal bleeding 2. History of melena 3. Nausea  History of Present Illness: [name] [name] is a 86 y.o. female with past medical history of atrial fibrillation on Eliquis, coronary artery disease status post PCI, hypertension, hyperlipidemia, PMR, IBS, embolic stroke and severe aortic stenosis, who comes to the clinic for follow-up of recent episode of rectal bleeding.  The patient was last seen on 05/08/2020. At that time, the patient was scheduled to undergo a CT of the abdomen and pelvis with IV contrast due to episodes of abdominal pain and she was given a prescription for Levsin and Zofran as needed due to recurrent nausea and abdominal cramping. She was also counseled to follow with cardiology to have her AS fixed before her consider an endoscopy evaluation.  The patient did not undergo this test as the day after she was seen in the clinic she started presenting episodes of large rectal bleeding. The patient comes with her daughter. Both state that she had large rectal bleeding since then, for which they decided to go to the hospital at [facility] for further evaluation. She did not have any abdominal pain different from her chronic pain, lightheadedness or dizziness. She was found to have a drop in her hemoglobin down to 9.7 but did not require a blood transfusion. Her Eliquis was decreased to 2.5 mg twice daily and the patient was discharged from the hospital as she refused to undergo a colonoscopy at that time  which was recommended by gastroenterology service.  Notably, she had a CT of the abdomen and pelvis with IV contrast which showed presence of diverticulosis but no other alterations explain her abdominal pain.  She states that since her discharge her bleeding has been happening very occasionally and is not as it used to be in the past. She does not know exactly how many times she has had rectal bleeding since she was discharged from the hospital but she states this happens "very seldom". She reports that she has been using a cream to improve "her hemorrhoids size". Denies having any new complaints at the moment but states that she is still having some lower abdominal pain and nausea which have improved with the medications that were prescribed previously. Has not presented more episodes of melena or hematemesis. The daughter states that she fell recently at home and hit the left side of her ribs which is hurting significantly.  The patient denies having any vomiting, fever, chills, hematochezia, melena, hematemesis, abdominal distention, diarrhea, jaundice, pruritus or weight loss.  Past Medical History: Past Medical History:  Diagnosis Date  . Atrial fibrillation ([place])    On Coumadin  . Coronary atherosclerosis of native coronary artery    Nonobstructive at cardiac catheterization 2004  . Depression   . Dysphagia   . Essential hypertension, benign   . Hyperlipidemia   . Polymyalgia rheumatica ([place])   . Stroke [facility])     Past Surgical History: Past Surgical History:  Procedure Laterality Date  . APPENDECTOMY    . COLONOSCOPY  2008   Dr.Fleshiman  . Left ankle    .  TOE SURGERY    . TOTAL ABDOMINAL HYSTERECTOMY      Family History: Family History  Problem Relation Age of Onset  . Stroke Mother   . Hypertension Sister   . Hypertension Brother   . Rheum arthritis Daughter   . Healthy Son   . Healthy Son   . Healthy Son   . Hypertension Sister   . Heart disease Sister   . Heart  disease Brother   . Hypertension Brother   . Arthritis Brother   . Colon cancer Brother   . Hypertension Brother   . Coronary artery disease Other     Social History: Social History   Tobacco Use  Smoking Status Never Smoker  Smokeless Tobacco Never Used   Social History   Substance and Sexual Activity  Alcohol Use No  . Alcohol/week: 0.0 standard drinks   Social History   Substance and Sexual Activity  Drug Use No    Allergies: Allergies  Allergen Reactions  . Sulfa Antibiotics Rash  . Sulfonamide Derivatives Rash    Medications: Current Outpatient Medications  Medication Sig Dispense Refill  . acetaminophen (TYLENOL) 500 MG tablet Take 500-1,000 mg by mouth daily as needed for moderate pain.     Marland Kitchen alendronate (FOSAMAX) 70 MG tablet Take 70 mg by mouth every Sunday.    . ALPRAZolam (XANAX) 0.25 MG tablet Take 0.25 mg by mouth every 8 (eight) hours as needed (panic attacks).     Marland Kitchen amLODipine (NORVASC) 5 MG tablet Take 5 mg by mouth daily.     Marland Kitchen apixaban (ELIQUIS) 2.5 MG TABS tablet Take 1 tablet (2.5 mg total) by mouth 2 (two) times daily. 60 tablet 0  . aspirin EC 81 MG tablet Take 81 mg by mouth daily. OTC    . atorvastatin (LIPITOR) 40 MG tablet Take 40 mg by mouth at bedtime.     Marland Kitchen buPROPion (WELLBUTRIN XL) 300 MG 24 hr tablet Take 300 mg by mouth daily.    . digoxin (LANOXIN) 0.125 MG tablet Take 0.125 mg by mouth daily. Chronic A-fib     . escitalopram (LEXAPRO) 10 MG tablet Take 10 mg by mouth daily.    . furosemide (LASIX) 40 MG tablet Take 40 mg by mouth daily.     . metoprolol tartrate (LOPRESSOR) 100 MG tablet Take 100 mg by mouth 2 (two) times daily.    . ondansetron (ZOFRAN) 4 MG tablet Take 1 tablet (4 mg total) by mouth every 8 (eight) hours as needed for nausea or vomiting. 30 tablet 1  . pantoprazole (PROTONIX) 40 MG tablet Take 40 mg by mouth daily.    . potassium chloride SA (K-DUR,KLOR-CON) 20 MEQ tablet Take 20 mEq by mouth daily.   6  .  predniSONE (DELTASONE) 5 MG tablet Take 5 mg by mouth daily.     . Vitamin D, Ergocalciferol, (DRISDOL) 1.25 MG (50000 UNIT) CAPS capsule Take 50,000 Units by mouth every Sunday.    . Biotin 1000 MCG tablet Take 1,000 mcg by mouth daily. (Patient not taking: Reported on 06/01/2020)    . hydrocortisone 2.5 % cream Apply 1 application topically 2 (two) times daily as needed (rash/itching).  (Patient not taking: Reported on 06/01/2020)     No current facility-administered medications for this visit.    Review of Systems: GENERAL: negative for malaise, night sweats HEENT: No changes in hearing or vision, no nose bleeds or other nasal problems. NECK: Negative for lumps, goiter, pain and significant neck swelling RESPIRATORY:  Negative for cough, wheezing CARDIOVASCULAR: Negative for chest pain, leg swelling, palpitations, orthopnea GI: SEE HPI MUSCULOSKELETAL: Negative for joint pain or swelling, back pain, and muscle pain. SKIN: Negative for lesions, rash PSYCH: Negative for sleep disturbance, mood disorder and recent psychosocial stressors. HEMATOLOGY Negative for prolonged bleeding, bruising easily, and swollen nodes. ENDOCRINE: Negative for cold or heat intolerance, polyuria, polydipsia and goiter. NEURO: negative for tremor, gait imbalance, syncope and seizures. The remainder of the review of systems is noncontributory.   Physical Exam: BP 119/67 (BP Location: Right Arm, Patient Position: Sitting, Cuff Size: Normal)   Pulse 66   Temp 98.9 F (37.2 C) (Oral)   Ht 5' 1.5" (1.562 m)   Wt 121 lb 8 oz (55.1 kg)   BMI 22.59 kg/m  GENERAL: The patient is AO x3, in no acute distress. Elder. Sitting in a roller aid. HEENT: Head is normocephalic and atraumatic. EOMI are intact. Mouth is well hydrated and without lesions. NECK: Supple. No masses LUNGS: Clear to auscultation. No presence of rhonchi/wheezing/rales. Adequate chest expansion HEART: RRR, normal s1 and s2. ABDOMEN: tender to  palpation in the left subcostal and costal area, also mildly tender in lower abdominal area, no guarding, no peritoneal signs, and nondistended. BS +. No masses. EXTREMITIES: Without any cyanosis, clubbing, rash, lesions or edema. NEUROLOGIC: AOx3, no focal motor deficit. SKIN: no jaundice, no rashes  Imaging/Labs: as above  I personally reviewed and interpreted the available labs, imaging and endoscopic files.  Impression and Plan: [name] [name] is a 86 y.o. female with past medical history of atrial fibrillation on Eliquis, coronary artery disease status post PCI, hypertension, hyperlipidemia, PMR, IBS, embolic stroke and severe aortic stenosis, who comes to the clinic for follow-up of recent episode of rectal bleeding. The patient had a significant drop in her hemoglobin which has much more improved after her Eliquis was lowered to twice daily dosing.  It is possible that her bleeding episode was related to diverticular bleeding, although other possibilities include AVMs versus hemorrhoidal bleeding as the patient had painless rectal bleeding.  She will follow with her PCP today and she will check her H&H during that visit, which she will bring to my office to review if she is stable.  I consider that her bleeding has resolved as she has been asymptomatic from that standpoint.  Regarding her abdominal pain and nausea, she had recent CT of the abdomen with IV contrast that was negative for any organic alterations that would explain the pain.  I consider this could be related to functional hypersensitivity in the bowel.  She can continue taking the Levsin and Zofran as needed.  She will follow with her PCP regarding the costal pain as this is musculoskeletal pain.  Finally, she was encouraged to follow with her cardiologist to undergo the aortic valve procedure as if she presents any more episodes of melena or hematochezia in the future, an endoscopic evaluation will be warranted but she will need to be  optimized from the cardiac standpoint before proceeding with this as previously discussed.  -Patient to send records of most recent hemoglobin from today's visit at her PCP's office -Follow-up with cardiologist regarding aortic valve procedure -Patient to reach his back if she has any worsening anemia or rectal bleeding/melena - RTC 3 months   All questions were answered.      Harvel Quale, MD Gastroenterology and Hepatology [facility]

## 2020-06-05 ENCOUNTER — Encounter (INDEPENDENT_AMBULATORY_CARE_PROVIDER_SITE_OTHER): Payer: Self-pay | Admitting: Gastroenterology

## 2020-06-05 NOTE — Progress Notes (Signed)
Received result of labs from 06/02/20 showing Hb 11.0 Hct 33.3 MCV 90 iron sat 9% iron 27 ferritin 52.  Results are better compared to previous labs.  Patient to follow with cardiologist for TAVR as previously discussed.  [name], MD Gastroenterology and Hepatology [facility]

## 2020-06-07 DIAGNOSIS — I77810 Thoracic aortic ectasia: Secondary | ICD-10-CM | POA: Diagnosis not present

## 2020-06-07 DIAGNOSIS — Z954 Presence of other heart-valve replacement: Secondary | ICD-10-CM | POA: Diagnosis not present

## 2020-06-07 DIAGNOSIS — Z8673 Personal history of transient ischemic attack (TIA), and cerebral infarction without residual deficits: Secondary | ICD-10-CM | POA: Diagnosis not present

## 2020-06-07 DIAGNOSIS — Z48812 Encounter for surgical aftercare following surgery on the circulatory system: Secondary | ICD-10-CM | POA: Diagnosis not present

## 2020-06-07 DIAGNOSIS — I350 Nonrheumatic aortic (valve) stenosis: Secondary | ICD-10-CM | POA: Diagnosis not present

## 2020-06-07 DIAGNOSIS — Z950 Presence of cardiac pacemaker: Secondary | ICD-10-CM | POA: Diagnosis not present

## 2020-06-07 DIAGNOSIS — I5032 Chronic diastolic (congestive) heart failure: Secondary | ICD-10-CM | POA: Diagnosis not present

## 2020-06-07 DIAGNOSIS — I700 Atherosclerosis of aorta: Secondary | ICD-10-CM | POA: Diagnosis not present

## 2020-06-10 DIAGNOSIS — S72001A Fracture of unspecified part of neck of right femur, initial encounter for closed fracture: Secondary | ICD-10-CM | POA: Diagnosis not present

## 2020-06-10 DIAGNOSIS — Z950 Presence of cardiac pacemaker: Secondary | ICD-10-CM | POA: Diagnosis not present

## 2020-06-10 DIAGNOSIS — N3000 Acute cystitis without hematuria: Secondary | ICD-10-CM | POA: Diagnosis not present

## 2020-06-10 DIAGNOSIS — S72141A Displaced intertrochanteric fracture of right femur, initial encounter for closed fracture: Secondary | ICD-10-CM | POA: Diagnosis not present

## 2020-06-10 DIAGNOSIS — Z20822 Contact with and (suspected) exposure to covid-19: Secondary | ICD-10-CM | POA: Diagnosis not present

## 2020-06-10 DIAGNOSIS — I119 Hypertensive heart disease without heart failure: Secondary | ICD-10-CM | POA: Diagnosis not present

## 2020-06-10 DIAGNOSIS — Z01818 Encounter for other preprocedural examination: Secondary | ICD-10-CM | POA: Diagnosis not present

## 2020-06-10 DIAGNOSIS — I350 Nonrheumatic aortic (valve) stenosis: Secondary | ICD-10-CM | POA: Diagnosis not present

## 2020-06-10 DIAGNOSIS — I4891 Unspecified atrial fibrillation: Secondary | ICD-10-CM | POA: Diagnosis not present

## 2020-06-10 DIAGNOSIS — I10 Essential (primary) hypertension: Secondary | ICD-10-CM | POA: Diagnosis not present

## 2020-06-10 DIAGNOSIS — W19XXXA Unspecified fall, initial encounter: Secondary | ICD-10-CM | POA: Diagnosis not present

## 2020-06-10 DIAGNOSIS — Z7901 Long term (current) use of anticoagulants: Secondary | ICD-10-CM | POA: Diagnosis not present

## 2020-06-10 DIAGNOSIS — S7291XA Unspecified fracture of right femur, initial encounter for closed fracture: Secondary | ICD-10-CM | POA: Diagnosis not present

## 2020-06-10 DIAGNOSIS — R001 Bradycardia, unspecified: Secondary | ICD-10-CM | POA: Diagnosis not present

## 2020-06-10 DIAGNOSIS — S72101A Unspecified trochanteric fracture of right femur, initial encounter for closed fracture: Secondary | ICD-10-CM | POA: Diagnosis not present

## 2020-06-11 DIAGNOSIS — M25551 Pain in right hip: Secondary | ICD-10-CM | POA: Diagnosis not present

## 2020-06-11 DIAGNOSIS — S72001D Fracture of unspecified part of neck of right femur, subsequent encounter for closed fracture with routine healing: Secondary | ICD-10-CM | POA: Diagnosis not present

## 2020-06-11 DIAGNOSIS — S72001A Fracture of unspecified part of neck of right femur, initial encounter for closed fracture: Secondary | ICD-10-CM | POA: Diagnosis not present

## 2020-06-11 DIAGNOSIS — Z01810 Encounter for preprocedural cardiovascular examination: Secondary | ICD-10-CM | POA: Diagnosis not present

## 2020-06-11 DIAGNOSIS — Z4789 Encounter for other orthopedic aftercare: Secondary | ICD-10-CM | POA: Diagnosis not present

## 2020-06-11 DIAGNOSIS — W19XXXA Unspecified fall, initial encounter: Secondary | ICD-10-CM | POA: Diagnosis not present

## 2020-06-11 DIAGNOSIS — D62 Acute posthemorrhagic anemia: Secondary | ICD-10-CM | POA: Diagnosis not present

## 2020-06-11 DIAGNOSIS — Z66 Do not resuscitate: Secondary | ICD-10-CM | POA: Diagnosis present

## 2020-06-11 DIAGNOSIS — J99 Respiratory disorders in diseases classified elsewhere: Secondary | ICD-10-CM | POA: Diagnosis not present

## 2020-06-11 DIAGNOSIS — Z950 Presence of cardiac pacemaker: Secondary | ICD-10-CM | POA: Diagnosis not present

## 2020-06-11 DIAGNOSIS — N3000 Acute cystitis without hematuria: Secondary | ICD-10-CM | POA: Diagnosis not present

## 2020-06-11 DIAGNOSIS — B957 Other staphylococcus as the cause of diseases classified elsewhere: Secondary | ICD-10-CM | POA: Diagnosis not present

## 2020-06-11 DIAGNOSIS — Z7983 Long term (current) use of bisphosphonates: Secondary | ICD-10-CM | POA: Diagnosis not present

## 2020-06-11 DIAGNOSIS — R5381 Other malaise: Secondary | ICD-10-CM | POA: Diagnosis not present

## 2020-06-11 DIAGNOSIS — N3090 Cystitis, unspecified without hematuria: Secondary | ICD-10-CM | POA: Diagnosis not present

## 2020-06-11 DIAGNOSIS — N390 Urinary tract infection, site not specified: Secondary | ICD-10-CM | POA: Diagnosis not present

## 2020-06-11 DIAGNOSIS — E785 Hyperlipidemia, unspecified: Secondary | ICD-10-CM | POA: Diagnosis not present

## 2020-06-11 DIAGNOSIS — Z800 Family history of malignant neoplasm of digestive organs: Secondary | ICD-10-CM | POA: Diagnosis not present

## 2020-06-11 DIAGNOSIS — I495 Sick sinus syndrome: Secondary | ICD-10-CM | POA: Diagnosis present

## 2020-06-11 DIAGNOSIS — Z20822 Contact with and (suspected) exposure to covid-19: Secondary | ICD-10-CM | POA: Diagnosis present

## 2020-06-11 DIAGNOSIS — S72141A Displaced intertrochanteric fracture of right femur, initial encounter for closed fracture: Secondary | ICD-10-CM | POA: Diagnosis not present

## 2020-06-11 DIAGNOSIS — K5790 Diverticulosis of intestine, part unspecified, without perforation or abscess without bleeding: Secondary | ICD-10-CM | POA: Diagnosis not present

## 2020-06-11 DIAGNOSIS — G8918 Other acute postprocedural pain: Secondary | ICD-10-CM | POA: Diagnosis not present

## 2020-06-11 DIAGNOSIS — I119 Hypertensive heart disease without heart failure: Secondary | ICD-10-CM | POA: Diagnosis not present

## 2020-06-11 DIAGNOSIS — Z89429 Acquired absence of other toe(s), unspecified side: Secondary | ICD-10-CM | POA: Diagnosis not present

## 2020-06-11 DIAGNOSIS — D649 Anemia, unspecified: Secondary | ICD-10-CM | POA: Diagnosis not present

## 2020-06-11 DIAGNOSIS — I4821 Permanent atrial fibrillation: Secondary | ICD-10-CM | POA: Diagnosis present

## 2020-06-11 DIAGNOSIS — I110 Hypertensive heart disease with heart failure: Secondary | ICD-10-CM | POA: Diagnosis not present

## 2020-06-11 DIAGNOSIS — Z9049 Acquired absence of other specified parts of digestive tract: Secondary | ICD-10-CM | POA: Diagnosis not present

## 2020-06-11 DIAGNOSIS — F32A Depression, unspecified: Secondary | ICD-10-CM | POA: Diagnosis present

## 2020-06-11 DIAGNOSIS — R9431 Abnormal electrocardiogram [ECG] [EKG]: Secondary | ICD-10-CM | POA: Diagnosis not present

## 2020-06-11 DIAGNOSIS — R930 Abnormal findings on diagnostic imaging of skull and head, not elsewhere classified: Secondary | ICD-10-CM | POA: Diagnosis not present

## 2020-06-11 DIAGNOSIS — N179 Acute kidney failure, unspecified: Secondary | ICD-10-CM | POA: Diagnosis not present

## 2020-06-11 DIAGNOSIS — Z7901 Long term (current) use of anticoagulants: Secondary | ICD-10-CM | POA: Diagnosis not present

## 2020-06-11 DIAGNOSIS — Z8739 Personal history of other diseases of the musculoskeletal system and connective tissue: Secondary | ICD-10-CM | POA: Diagnosis not present

## 2020-06-11 DIAGNOSIS — K922 Gastrointestinal hemorrhage, unspecified: Secondary | ICD-10-CM | POA: Diagnosis not present

## 2020-06-11 DIAGNOSIS — I5042 Chronic combined systolic (congestive) and diastolic (congestive) heart failure: Secondary | ICD-10-CM | POA: Diagnosis not present

## 2020-06-11 DIAGNOSIS — S72141D Displaced intertrochanteric fracture of right femur, subsequent encounter for closed fracture with routine healing: Secondary | ICD-10-CM | POA: Diagnosis not present

## 2020-06-11 DIAGNOSIS — I5032 Chronic diastolic (congestive) heart failure: Secondary | ICD-10-CM | POA: Diagnosis present

## 2020-06-11 DIAGNOSIS — I130 Hypertensive heart and chronic kidney disease with heart failure and stage 1 through stage 4 chronic kidney disease, or unspecified chronic kidney disease: Secondary | ICD-10-CM | POA: Diagnosis present

## 2020-06-11 DIAGNOSIS — M353 Polymyalgia rheumatica: Secondary | ICD-10-CM | POA: Diagnosis present

## 2020-06-11 DIAGNOSIS — I4891 Unspecified atrial fibrillation: Secondary | ICD-10-CM | POA: Diagnosis not present

## 2020-06-11 DIAGNOSIS — Z7982 Long term (current) use of aspirin: Secondary | ICD-10-CM | POA: Diagnosis not present

## 2020-06-11 DIAGNOSIS — N189 Chronic kidney disease, unspecified: Secondary | ICD-10-CM | POA: Diagnosis present

## 2020-06-11 DIAGNOSIS — Z6821 Body mass index (BMI) 21.0-21.9, adult: Secondary | ICD-10-CM | POA: Diagnosis not present

## 2020-06-11 DIAGNOSIS — I69398 Other sequelae of cerebral infarction: Secondary | ICD-10-CM | POA: Diagnosis not present

## 2020-06-11 DIAGNOSIS — Y92012 Bathroom of single-family (private) house as the place of occurrence of the external cause: Secondary | ICD-10-CM | POA: Diagnosis not present

## 2020-06-11 DIAGNOSIS — I350 Nonrheumatic aortic (valve) stenosis: Secondary | ICD-10-CM | POA: Diagnosis not present

## 2020-06-11 DIAGNOSIS — Z6820 Body mass index (BMI) 20.0-20.9, adult: Secondary | ICD-10-CM | POA: Diagnosis not present

## 2020-06-11 DIAGNOSIS — F331 Major depressive disorder, recurrent, moderate: Secondary | ICD-10-CM | POA: Diagnosis not present

## 2020-06-11 DIAGNOSIS — M6281 Muscle weakness (generalized): Secondary | ICD-10-CM | POA: Diagnosis not present

## 2020-06-11 DIAGNOSIS — K219 Gastro-esophageal reflux disease without esophagitis: Secondary | ICD-10-CM | POA: Diagnosis not present

## 2020-06-11 DIAGNOSIS — Z882 Allergy status to sulfonamides status: Secondary | ICD-10-CM | POA: Diagnosis not present

## 2020-06-11 DIAGNOSIS — D849 Immunodeficiency, unspecified: Secondary | ICD-10-CM | POA: Diagnosis not present

## 2020-06-11 DIAGNOSIS — R279 Unspecified lack of coordination: Secondary | ICD-10-CM | POA: Diagnosis not present

## 2020-06-11 DIAGNOSIS — I10 Essential (primary) hypertension: Secondary | ICD-10-CM | POA: Diagnosis not present

## 2020-06-11 DIAGNOSIS — Z8673 Personal history of transient ischemic attack (TIA), and cerebral infarction without residual deficits: Secondary | ICD-10-CM | POA: Diagnosis not present

## 2020-06-11 DIAGNOSIS — S72101A Unspecified trochanteric fracture of right femur, initial encounter for closed fracture: Secondary | ICD-10-CM | POA: Diagnosis not present

## 2020-06-11 DIAGNOSIS — R410 Disorientation, unspecified: Secondary | ICD-10-CM | POA: Diagnosis not present

## 2020-06-16 DIAGNOSIS — R279 Unspecified lack of coordination: Secondary | ICD-10-CM | POA: Diagnosis not present

## 2020-06-16 DIAGNOSIS — M25551 Pain in right hip: Secondary | ICD-10-CM | POA: Diagnosis not present

## 2020-06-16 DIAGNOSIS — I5032 Chronic diastolic (congestive) heart failure: Secondary | ICD-10-CM | POA: Diagnosis not present

## 2020-06-16 DIAGNOSIS — K649 Unspecified hemorrhoids: Secondary | ICD-10-CM | POA: Diagnosis not present

## 2020-06-16 DIAGNOSIS — K5790 Diverticulosis of intestine, part unspecified, without perforation or abscess without bleeding: Secondary | ICD-10-CM | POA: Diagnosis not present

## 2020-06-16 DIAGNOSIS — N3000 Acute cystitis without hematuria: Secondary | ICD-10-CM | POA: Diagnosis not present

## 2020-06-16 DIAGNOSIS — K922 Gastrointestinal hemorrhage, unspecified: Secondary | ICD-10-CM | POA: Diagnosis not present

## 2020-06-16 DIAGNOSIS — I639 Cerebral infarction, unspecified: Secondary | ICD-10-CM | POA: Diagnosis not present

## 2020-06-16 DIAGNOSIS — D649 Anemia, unspecified: Secondary | ICD-10-CM | POA: Diagnosis not present

## 2020-06-16 DIAGNOSIS — S7291XA Unspecified fracture of right femur, initial encounter for closed fracture: Secondary | ICD-10-CM | POA: Diagnosis not present

## 2020-06-16 DIAGNOSIS — R5381 Other malaise: Secondary | ICD-10-CM | POA: Diagnosis not present

## 2020-06-16 DIAGNOSIS — J99 Respiratory disorders in diseases classified elsewhere: Secondary | ICD-10-CM | POA: Diagnosis not present

## 2020-06-16 DIAGNOSIS — B351 Tinea unguium: Secondary | ICD-10-CM | POA: Diagnosis not present

## 2020-06-16 DIAGNOSIS — I69398 Other sequelae of cerebral infarction: Secondary | ICD-10-CM | POA: Diagnosis not present

## 2020-06-16 DIAGNOSIS — M6281 Muscle weakness (generalized): Secondary | ICD-10-CM | POA: Diagnosis not present

## 2020-06-16 DIAGNOSIS — R4182 Altered mental status, unspecified: Secondary | ICD-10-CM | POA: Diagnosis not present

## 2020-06-16 DIAGNOSIS — S72001A Fracture of unspecified part of neck of right femur, initial encounter for closed fracture: Secondary | ICD-10-CM | POA: Diagnosis not present

## 2020-06-16 DIAGNOSIS — N179 Acute kidney failure, unspecified: Secondary | ICD-10-CM | POA: Diagnosis not present

## 2020-06-16 DIAGNOSIS — I350 Nonrheumatic aortic (valve) stenosis: Secondary | ICD-10-CM | POA: Diagnosis not present

## 2020-06-16 DIAGNOSIS — I7091 Generalized atherosclerosis: Secondary | ICD-10-CM | POA: Diagnosis not present

## 2020-06-16 DIAGNOSIS — E785 Hyperlipidemia, unspecified: Secondary | ICD-10-CM | POA: Diagnosis not present

## 2020-06-16 DIAGNOSIS — F331 Major depressive disorder, recurrent, moderate: Secondary | ICD-10-CM | POA: Diagnosis not present

## 2020-06-16 DIAGNOSIS — M353 Polymyalgia rheumatica: Secondary | ICD-10-CM | POA: Diagnosis not present

## 2020-06-16 DIAGNOSIS — Z6821 Body mass index (BMI) 21.0-21.9, adult: Secondary | ICD-10-CM | POA: Diagnosis not present

## 2020-06-16 DIAGNOSIS — S72001D Fracture of unspecified part of neck of right femur, subsequent encounter for closed fracture with routine healing: Secondary | ICD-10-CM | POA: Diagnosis not present

## 2020-06-16 DIAGNOSIS — F32A Depression, unspecified: Secondary | ICD-10-CM | POA: Diagnosis not present

## 2020-06-16 DIAGNOSIS — D849 Immunodeficiency, unspecified: Secondary | ICD-10-CM | POA: Diagnosis not present

## 2020-06-16 DIAGNOSIS — I5042 Chronic combined systolic (congestive) and diastolic (congestive) heart failure: Secondary | ICD-10-CM | POA: Diagnosis not present

## 2020-06-16 DIAGNOSIS — S72141D Displaced intertrochanteric fracture of right femur, subsequent encounter for closed fracture with routine healing: Secondary | ICD-10-CM | POA: Diagnosis not present

## 2020-06-16 DIAGNOSIS — K219 Gastro-esophageal reflux disease without esophagitis: Secondary | ICD-10-CM | POA: Diagnosis not present

## 2020-06-16 DIAGNOSIS — I10 Essential (primary) hypertension: Secondary | ICD-10-CM | POA: Diagnosis not present

## 2020-06-16 DIAGNOSIS — I495 Sick sinus syndrome: Secondary | ICD-10-CM | POA: Diagnosis not present

## 2020-06-16 DIAGNOSIS — I4891 Unspecified atrial fibrillation: Secondary | ICD-10-CM | POA: Diagnosis not present

## 2020-06-16 DIAGNOSIS — Z4789 Encounter for other orthopedic aftercare: Secondary | ICD-10-CM | POA: Diagnosis not present

## 2020-06-20 DIAGNOSIS — S7291XA Unspecified fracture of right femur, initial encounter for closed fracture: Secondary | ICD-10-CM | POA: Diagnosis not present

## 2020-06-20 DIAGNOSIS — I4891 Unspecified atrial fibrillation: Secondary | ICD-10-CM | POA: Diagnosis not present

## 2020-06-20 DIAGNOSIS — I5032 Chronic diastolic (congestive) heart failure: Secondary | ICD-10-CM | POA: Diagnosis not present

## 2020-06-20 DIAGNOSIS — I10 Essential (primary) hypertension: Secondary | ICD-10-CM | POA: Diagnosis not present

## 2020-06-20 DIAGNOSIS — I350 Nonrheumatic aortic (valve) stenosis: Secondary | ICD-10-CM | POA: Diagnosis not present

## 2020-06-20 DIAGNOSIS — N179 Acute kidney failure, unspecified: Secondary | ICD-10-CM | POA: Diagnosis not present

## 2020-06-20 DIAGNOSIS — I495 Sick sinus syndrome: Secondary | ICD-10-CM | POA: Diagnosis not present

## 2020-06-20 DIAGNOSIS — D649 Anemia, unspecified: Secondary | ICD-10-CM | POA: Diagnosis not present

## 2020-06-21 ENCOUNTER — Other Ambulatory Visit: Payer: Self-pay | Admitting: *Deleted

## 2020-06-21 DIAGNOSIS — I10 Essential (primary) hypertension: Secondary | ICD-10-CM | POA: Diagnosis not present

## 2020-06-21 DIAGNOSIS — I4891 Unspecified atrial fibrillation: Secondary | ICD-10-CM | POA: Diagnosis not present

## 2020-06-21 DIAGNOSIS — I639 Cerebral infarction, unspecified: Secondary | ICD-10-CM | POA: Diagnosis not present

## 2020-06-21 DIAGNOSIS — S7291XA Unspecified fracture of right femur, initial encounter for closed fracture: Secondary | ICD-10-CM | POA: Diagnosis not present

## 2020-06-21 DIAGNOSIS — F32A Depression, unspecified: Secondary | ICD-10-CM | POA: Diagnosis not present

## 2020-06-21 NOTE — Patient Outreach (Signed)
Member screened for potential [facility] Care Management needs as a benefit of [place] Medicare.  Per Patient Pearletha Forge member resides in [facility].   Communication sent to [facility] SN[facility] SW to inquire about anticipated transition plans and potential [facility] Care Management needs.  Will continue to follow while member resides in SNF.   Marthenia Rolling, MSN-Ed, RN,BSN [place] Acute Care Coordinator [phone] [facility]) [phone]  (Toll free office)

## 2020-06-24 DIAGNOSIS — R4182 Altered mental status, unspecified: Secondary | ICD-10-CM | POA: Diagnosis not present

## 2020-06-26 ENCOUNTER — Telehealth: Payer: Self-pay

## 2020-06-26 NOTE — Telephone Encounter (Signed)
(  12:30p)Telephone call to patient to schedule palliative care visit with patient. SW was informed by patient's husband Rush Landmark that patient was in [facility] for rehab and long-term care placement. He advised that patient had several falls and could not longer walker and needed more care. SW advised him that she will inform/update the NP for follow-up.

## 2020-06-27 ENCOUNTER — Other Ambulatory Visit: Payer: Self-pay | Admitting: *Deleted

## 2020-06-27 NOTE — Patient Outreach (Signed)
[facility] Post- Acute Care Coordinator follow up. Member screened for potential [facility] Care Management needs as a benefit of [place] Medicare.  Mrs. Chavis remains in [facility] SNF. Update previously received from [facility] SW indicating member's daughter reports plan will be to return home with caregiver assistance. It appears, per chart notes, LTC may also be discussed as well.   Will continue to follow for transition plans while member resides in SNF.   Marthenia Rolling, MSN-Ed, RN,BSN [place] Acute Care Coordinator [phone] [facility]) [phone]  (Toll free office)

## 2020-07-01 DIAGNOSIS — K649 Unspecified hemorrhoids: Secondary | ICD-10-CM | POA: Diagnosis not present

## 2020-07-03 ENCOUNTER — Ambulatory Visit (INDEPENDENT_AMBULATORY_CARE_PROVIDER_SITE_OTHER): Payer: Medicare Other | Admitting: Gastroenterology

## 2020-07-10 ENCOUNTER — Ambulatory Visit (INDEPENDENT_AMBULATORY_CARE_PROVIDER_SITE_OTHER): Payer: Medicare Other | Admitting: Gastroenterology

## 2020-07-18 DIAGNOSIS — K219 Gastro-esophageal reflux disease without esophagitis: Secondary | ICD-10-CM | POA: Diagnosis not present

## 2020-07-21 ENCOUNTER — Other Ambulatory Visit: Payer: Self-pay | Admitting: *Deleted

## 2020-07-21 NOTE — Patient Outreach (Signed)
[facility] Post- Acute Care Coordinator follow up. Member screened for potential [facility] Care Management needs as a benefit of [place] Medicare.  Verified in [facility] Patient Pearletha [place] that member remains in skilled care at [facility] SNF. Communication sent to facility SW to request update on transition plans.    Marthenia Rolling, MSN, RN,BSN [place] Acute Care Coordinator [phone] [facility]) [phone]  (Toll free office)

## 2020-07-21 NOTE — Patient Outreach (Signed)
[place] Coordinator followup. Member screened for potential [facility] Care Management needs as a benefit of [place] Medicare.  Update received from [facility] SN[facility] SW indicating member's daughter Izola Price has been primary contact for transition plans. States member's daughter Shauna Hugh endorses transition plan is for home post SNF stay.   SNF SW reports Mrs. Touch is doing well with therapy and has follow up ortho appointment next week.  Will plan outreach to discuss [place] Management services for post SNF stay.   Marthenia Rolling, MSN, RN,BSN [place] Acute Care Coordinator [phone] [facility]) [phone]  (Toll free office)

## 2020-07-25 DIAGNOSIS — I7091 Generalized atherosclerosis: Secondary | ICD-10-CM | POA: Diagnosis not present

## 2020-07-25 DIAGNOSIS — B351 Tinea unguium: Secondary | ICD-10-CM | POA: Diagnosis not present

## 2020-07-27 DIAGNOSIS — S7291XA Unspecified fracture of right femur, initial encounter for closed fracture: Secondary | ICD-10-CM | POA: Diagnosis not present

## 2020-07-27 DIAGNOSIS — E785 Hyperlipidemia, unspecified: Secondary | ICD-10-CM | POA: Diagnosis not present

## 2020-07-27 DIAGNOSIS — I350 Nonrheumatic aortic (valve) stenosis: Secondary | ICD-10-CM | POA: Diagnosis not present

## 2020-07-27 DIAGNOSIS — I4891 Unspecified atrial fibrillation: Secondary | ICD-10-CM | POA: Diagnosis not present

## 2020-07-27 DIAGNOSIS — I10 Essential (primary) hypertension: Secondary | ICD-10-CM | POA: Diagnosis not present

## 2020-07-27 DIAGNOSIS — I495 Sick sinus syndrome: Secondary | ICD-10-CM | POA: Diagnosis not present

## 2020-07-27 DIAGNOSIS — D649 Anemia, unspecified: Secondary | ICD-10-CM | POA: Diagnosis not present

## 2020-07-27 DIAGNOSIS — I5032 Chronic diastolic (congestive) heart failure: Secondary | ICD-10-CM | POA: Diagnosis not present

## 2020-08-01 ENCOUNTER — Other Ambulatory Visit: Payer: Self-pay | Admitting: *Deleted

## 2020-08-01 DIAGNOSIS — I69398 Other sequelae of cerebral infarction: Secondary | ICD-10-CM | POA: Diagnosis not present

## 2020-08-01 NOTE — Patient Outreach (Signed)
[place] Coordinator follow up. Member screened for potential [facility] Care Management needs as a benefit of [place] Medicare.  Telephone call made to [name] [name] [phone] to discuss [place] Management follow up. [name] [name] unable to hear writer over the phone. [name] [name] gave writer permission to speak with her daughter [name] [name] who was present with [name] [name].  [name] [name] endorses that both member and spouse are very hard of hearing.   [name] [name] (daughter) confirms that member came home from [facility] SNF on yesterday 07/31/20. [name] [name] is follow for therapy.   Confirms PCP appointment is on Friday, 08/04/20 at 1245 pm. States she is not sure if palliative will follow. States hospice has been suggested to them.  [name] [name] states she is not sure if member will enroll with hospice. States she is waiting until the appointment with Dr. Quillian Quince on Friday to know more. [name] [name] asks that "hospice" is not mentioned around [name] [name]. [name] [name] also states she wants to discuss hospice with other family members.  Discussed [facility] Care Management follow up until hospice services have been decided upon. [name] [name] is high risk for readmission. [name] [name] is agreeable and states she should be contacted for post SNF calls. Confirmed [name] [name]'s number as [phone].  [name] [name] also reports that member has caregiver assistance 5 days a week from 9 am until 2 pm. [name] [name] states she is with [name] [name] after 2 pm. States she is in the process looking for additional assistance at night.  Explained [facility] Care Management will not interfere or replace services provided by home health. Also made [name] [name] aware that [place] Management will not continue to follow if hospice services are elected. [name] [name] expressed understanding.  Will make referral to [place] for care coordination until hospice arrangements have been determined.    [name] Rolling, MSN, RN,BSN [place] Acute Care  Coordinator [phone] [facility]) [phone]  (Toll free office)

## 2020-08-02 DIAGNOSIS — E785 Hyperlipidemia, unspecified: Secondary | ICD-10-CM | POA: Diagnosis not present

## 2020-08-02 DIAGNOSIS — F331 Major depressive disorder, recurrent, moderate: Secondary | ICD-10-CM | POA: Diagnosis not present

## 2020-08-02 DIAGNOSIS — R110 Nausea: Secondary | ICD-10-CM | POA: Diagnosis not present

## 2020-08-02 DIAGNOSIS — I10 Essential (primary) hypertension: Secondary | ICD-10-CM | POA: Diagnosis not present

## 2020-08-02 DIAGNOSIS — I4891 Unspecified atrial fibrillation: Secondary | ICD-10-CM | POA: Diagnosis not present

## 2020-08-02 DIAGNOSIS — F419 Anxiety disorder, unspecified: Secondary | ICD-10-CM | POA: Diagnosis not present

## 2020-08-02 DIAGNOSIS — K922 Gastrointestinal hemorrhage, unspecified: Secondary | ICD-10-CM | POA: Diagnosis not present

## 2020-08-02 DIAGNOSIS — M810 Age-related osteoporosis without current pathological fracture: Secondary | ICD-10-CM | POA: Diagnosis not present

## 2020-08-02 DIAGNOSIS — K219 Gastro-esophageal reflux disease without esophagitis: Secondary | ICD-10-CM | POA: Diagnosis not present

## 2020-08-02 DIAGNOSIS — G309 Alzheimer's disease, unspecified: Secondary | ICD-10-CM | POA: Diagnosis not present

## 2020-08-02 DIAGNOSIS — I509 Heart failure, unspecified: Secondary | ICD-10-CM | POA: Diagnosis not present

## 2020-08-03 ENCOUNTER — Other Ambulatory Visit: Payer: Self-pay | Admitting: *Deleted

## 2020-08-03 NOTE — Patient Outreach (Signed)
[facility]) Care Management  08/03/2020  [name] [name] 21-Sep-1929 [phone]  Telephone outreach for follow up from SNF stay.  Talked with pt's daugher [name] [name], who reports her mother has Hospice at home and she appears to be in her last days. She expressed her appreciation for the call. NP provided emotional support and assured her that the Hospice services will look after their every need.  [facility] services not needed.  [name] [name]. [name] Neither, MSN, [facility] Gerontological Nurse Practitioner [facility] Care Management [phone]

## 2020-08-04 DIAGNOSIS — K922 Gastrointestinal hemorrhage, unspecified: Secondary | ICD-10-CM | POA: Diagnosis not present

## 2020-08-04 DIAGNOSIS — I509 Heart failure, unspecified: Secondary | ICD-10-CM | POA: Diagnosis not present

## 2020-08-04 DIAGNOSIS — I4891 Unspecified atrial fibrillation: Secondary | ICD-10-CM | POA: Diagnosis not present

## 2020-08-04 DIAGNOSIS — R110 Nausea: Secondary | ICD-10-CM | POA: Diagnosis not present

## 2020-08-04 DIAGNOSIS — K219 Gastro-esophageal reflux disease without esophagitis: Secondary | ICD-10-CM | POA: Diagnosis not present

## 2020-08-04 DIAGNOSIS — I10 Essential (primary) hypertension: Secondary | ICD-10-CM | POA: Diagnosis not present

## 2020-08-05 DIAGNOSIS — I509 Heart failure, unspecified: Secondary | ICD-10-CM | POA: Diagnosis not present

## 2020-08-05 DIAGNOSIS — I10 Essential (primary) hypertension: Secondary | ICD-10-CM | POA: Diagnosis not present

## 2020-08-05 DIAGNOSIS — I4891 Unspecified atrial fibrillation: Secondary | ICD-10-CM | POA: Diagnosis not present

## 2020-08-05 DIAGNOSIS — K922 Gastrointestinal hemorrhage, unspecified: Secondary | ICD-10-CM | POA: Diagnosis not present

## 2020-08-05 DIAGNOSIS — R110 Nausea: Secondary | ICD-10-CM | POA: Diagnosis not present

## 2020-08-05 DIAGNOSIS — K219 Gastro-esophageal reflux disease without esophagitis: Secondary | ICD-10-CM | POA: Diagnosis not present

## 2020-08-19 DEATH — deceased

## 2020-08-31 ENCOUNTER — Ambulatory Visit (INDEPENDENT_AMBULATORY_CARE_PROVIDER_SITE_OTHER): Payer: Medicare Other | Admitting: Gastroenterology
